# Patient Record
Sex: Female | Born: 1940 | Race: White | Hispanic: No | Marital: Married | Smoking: Former smoker
Health system: Southern US, Community
[De-identification: ages and names within clinical notes are randomized; demographics above are authoritative.]

## PROBLEM LIST (undated history)

## (undated) DIAGNOSIS — E7800 Pure hypercholesterolemia, unspecified: Secondary | ICD-10-CM

## (undated) DIAGNOSIS — E240 Pituitary-dependent Cushing's disease: Secondary | ICD-10-CM

## (undated) DIAGNOSIS — C569 Malignant neoplasm of unspecified ovary: Secondary | ICD-10-CM

## (undated) DIAGNOSIS — C787 Secondary malignant neoplasm of liver and intrahepatic bile duct: Secondary | ICD-10-CM

## (undated) DIAGNOSIS — M8580 Other specified disorders of bone density and structure, unspecified site: Secondary | ICD-10-CM

## (undated) DIAGNOSIS — I10 Essential (primary) hypertension: Secondary | ICD-10-CM

## (undated) HISTORY — PX: VAGINAL HYSTERECTOMY: SUR661

---

## 2015-07-07 ENCOUNTER — Other Ambulatory Visit: Payer: Self-pay | Admitting: Obstetrics & Gynecology

## 2015-07-07 DIAGNOSIS — R1900 Intra-abdominal and pelvic swelling, mass and lump, unspecified site: Secondary | ICD-10-CM

## 2015-07-10 ENCOUNTER — Inpatient Hospital Stay: Admission: RE | Admit: 2015-07-10 | Payer: Self-pay | Source: Ambulatory Visit

## 2015-07-13 ENCOUNTER — Ambulatory Visit
Admission: RE | Admit: 2015-07-13 | Discharge: 2015-07-13 | Disposition: A | Discharge status: Home / Self Care | Payer: PRIVATE HEALTH INSURANCE | Source: Ambulatory Visit | Attending: Obstetrics & Gynecology | Admitting: Obstetrics & Gynecology

## 2015-07-13 DIAGNOSIS — R1900 Intra-abdominal and pelvic swelling, mass and lump, unspecified site: Secondary | ICD-10-CM

## 2015-07-13 MED ORDER — IOPAMIDOL (ISOVUE-300) INJECTION 61%
100.0000 mL | Freq: Once | INTRAVENOUS | Status: AC | PRN
  Administered 2015-07-13: 100 mL via INTRAVENOUS

## 2015-07-23 DIAGNOSIS — J302 Other seasonal allergic rhinitis: Secondary | ICD-10-CM | POA: Insufficient documentation

## 2015-07-23 DIAGNOSIS — K8020 Calculus of gallbladder without cholecystitis without obstruction: Secondary | ICD-10-CM | POA: Insufficient documentation

## 2015-07-23 DIAGNOSIS — M159 Polyosteoarthritis, unspecified: Secondary | ICD-10-CM | POA: Insufficient documentation

## 2015-07-23 DIAGNOSIS — Z8639 Personal history of other endocrine, nutritional and metabolic disease: Secondary | ICD-10-CM | POA: Insufficient documentation

## 2015-07-23 DIAGNOSIS — R030 Elevated blood-pressure reading, without diagnosis of hypertension: Secondary | ICD-10-CM | POA: Insufficient documentation

## 2015-07-30 ENCOUNTER — Other Ambulatory Visit (HOSPITAL_COMMUNITY)
Admission: RE | Admit: 2015-07-30 | Discharge: 2015-07-30 | Disposition: A | Discharge status: Home / Self Care | Payer: PRIVATE HEALTH INSURANCE | Source: Ambulatory Visit | Attending: Obstetrics & Gynecology | Admitting: Obstetrics & Gynecology

## 2015-07-30 ENCOUNTER — Other Ambulatory Visit: Payer: Self-pay | Admitting: Obstetrics & Gynecology

## 2015-07-30 DIAGNOSIS — R1900 Intra-abdominal and pelvic swelling, mass and lump, unspecified site: Secondary | ICD-10-CM | POA: Diagnosis present

## 2015-08-07 ENCOUNTER — Telehealth: Payer: Self-pay | Admitting: *Deleted

## 2015-08-07 ENCOUNTER — Encounter: Payer: Self-pay | Admitting: Gynecology

## 2015-08-07 ENCOUNTER — Ambulatory Visit: Payer: Medicare Other | Attending: Gynecology | Admitting: Gynecology

## 2015-08-07 VITALS — BP 139/76 | HR 84 | Temp 98.2°F | Resp 20

## 2015-08-07 DIAGNOSIS — E782 Mixed hyperlipidemia: Secondary | ICD-10-CM | POA: Insufficient documentation

## 2015-08-07 DIAGNOSIS — C561 Malignant neoplasm of right ovary: Secondary | ICD-10-CM | POA: Insufficient documentation

## 2015-08-07 NOTE — Patient Instructions (Signed)
We will have you meet with Dr. Evlyn Clines, Medical Oncologist, to discuss initiating chemotherapy.  Please call for any questions or concerns.

## 2015-08-07 NOTE — Telephone Encounter (Signed)
PT. HAS BEEN CALLED BY KIM IN GYN TO INFORM PT. THAT HER APPOINTMENT WITH DR.CLARKE PEARSON IS TODAY AT 3:00PM.

## 2015-08-07 NOTE — Progress Notes (Signed)
Consult Note: Gyn-Onc   Emily Hull 74 y.o. female  No chief complaint on file.   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  Plan: The pros and cons of completion of surgical staging versus initiation of primary chemotherapy were discussed with the patient and her husband. In my opinion, completion of surgical staging at this juncture would simply be an academic matter to more precisely assigned surgical stage. On the other hand, even with the current pathology, she is stage II C and needs chemotherapy. Rather than delay initiation of chemotherapy to perform additional surgery the patient, her husband, and iron agreement to proceed with chemotherapy.  I recommend treatment with carboplatin and Taxol every 3 weeks for 6 cycles. We will monitor CA-125 at each cycle. At the completion of 6 cycles, provided the CA-125 is normal, we'll obtain a CT scan for restaging and then initiate follow-up at 3 month intervals for 2 years.  The patient's given an appointment to see Dr. Marko Hull as a new patient. I will plan to see the patient back periodically during chemotherapy.    HPI: 74 year old white married female seen in consultation request of Dr. Dory Hull regarding management of a newly diagnosed high-grade papillary serous carcinoma the ovary. The patient was initially seen for evaluation of vaginal discharge and postmenopausal bleeding. An ultrasound was obtained on 07/02/2015 which showed heterogeneous endometrial echogenicity with a 1.2 cm hypoechoic posterior myometrial mass and endometrial stripe measuring 6-7 mm. In addition the right ovary had a 1.9 cm cystic mass with slightly prominent vascularity. The left ovary appeared to measure 5. 4 x 4 by 3.9 cm and was cystic with grossly normal ovarian flow. There is a tiny amount of free peritoneal fluid in the cul-de-sac. CA-125 is 55 units per mL.  Patient underwent a CT scan of the abdomen and pelvis on August 22 showing a 5.2 cm left adnexal mass, colonic diverticulosis gallstones small hiatal hernia. There are no pathologic enlarged lymph nodes and no evidence of peritoneal disease. Patient underwent laparoscopically-assisted total vaginal strep B bilateral salpingo-oophorectomy on the temporal ninth 2016. She was found to have a high-grade serous carcinoma arising from the right ovary involving the serosa of the ovary and adjacent fallopian tube. Peritoneal washings were positive. 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 The patient's had an uncomplicated postoperative course.  ( review of the pathology report says that the right ovary contained the malignancy however I wonder if these were mislabeled. Given the 2 imaging studies and Dr. Verlon Hull operative note indicating that the malignancy was in the left ovary)  Review of Systems:10 point review of systems is negative except as noted in interval history.   Vitals: Blood pressure 139/76, pulse 84, temperature 98.2 F (36.8 C), temperature source Oral, resp. rate 20.  Physical Exam: General : The patient is a healthy woman in no acute distress.  HEENT: normocephalic, extraoccular movements normal; neck is supple without thyromegally  Lynphnodes: Supraclavicular and inguinal nodes not enlarged  Abdomen: Soft, non-tender, no ascites, no organomegally, no masses, no hernias , incisions are healing well Pelvic:  Deferred     Not on File  History reviewed. No pertinent past medical history.  History reviewed. No pertinent past surgical history.  Current Outpatient Prescriptions  Medication Sig Dispense Refill  . aspirin 81 MG EC tablet Take 81 mg by mouth.    [name]  atorvastatin (LIPITOR) 10 MG tablet     . Calcium-Vitamin D 500-125 MG-UNIT TABS Take by mouth.    . Coenzyme Q10 (CO Q 10) 100 MG CAPS Take 1 capsule by mouth.     . Glucosamine-Chondroitin 500-400 MG CAPS Take by mouth.    . loratadine (CLARITIN) 10 MG tablet Take 10 mg by mouth.    . Multiple Vitamin (MULTIVITAMIN) capsule Take 1 capsule by mouth.    . Omega-3 Fatty Acids (OMEGA-3 FISH OIL) 300 MG CAPS Take 1 capsule by mouth.    . vitamin E 400 UNIT capsule Take 400 Units by mouth.     No current facility-administered medications for this visit.    Social History   Social History  . Marital Status: Married    Spouse Name: N/A  . Number of Children: N/A  . Years of Education: N/A   Occupational History  . Not on file.   Social History Main Topics  . Smoking status: Never Smoker   . Smokeless tobacco: Not on file  . Alcohol Use: No  . Drug Use: No  . Sexual Activity: Not on file   Other Topics Concern  . Not on file   Social History Narrative  . No narrative on file    History reviewed. No pertinent family history.    Emily Chapel, MD 08/07/2015, 3:08 PM

## 2015-08-09 ENCOUNTER — Other Ambulatory Visit: Payer: Self-pay | Admitting: Oncology

## 2015-08-09 DIAGNOSIS — C562 Malignant neoplasm of left ovary: Secondary | ICD-10-CM

## 2015-08-11 ENCOUNTER — Other Ambulatory Visit: Payer: Medicare Other

## 2015-08-11 ENCOUNTER — Encounter: Payer: Self-pay | Admitting: *Deleted

## 2015-08-12 ENCOUNTER — Other Ambulatory Visit: Payer: Self-pay | Admitting: Oncology

## 2015-08-13 ENCOUNTER — Ambulatory Visit (HOSPITAL_BASED_OUTPATIENT_CLINIC_OR_DEPARTMENT_OTHER): Payer: Medicare Other | Admitting: Oncology

## 2015-08-13 ENCOUNTER — Other Ambulatory Visit: Payer: Self-pay | Admitting: *Deleted

## 2015-08-13 ENCOUNTER — Telehealth: Payer: Self-pay | Admitting: Oncology

## 2015-08-13 ENCOUNTER — Other Ambulatory Visit (HOSPITAL_BASED_OUTPATIENT_CLINIC_OR_DEPARTMENT_OTHER): Payer: Medicare Other

## 2015-08-13 VITALS — BP 151/79 | HR 73 | Temp 98.0°F | Resp 18 | Ht 64.5 in | Wt 151.6 lb

## 2015-08-13 DIAGNOSIS — C561 Malignant neoplasm of right ovary: Secondary | ICD-10-CM | POA: Diagnosis not present

## 2015-08-13 DIAGNOSIS — E7800 Pure hypercholesterolemia, unspecified: Secondary | ICD-10-CM

## 2015-08-13 DIAGNOSIS — C562 Malignant neoplasm of left ovary: Secondary | ICD-10-CM

## 2015-08-13 DIAGNOSIS — M8580 Other specified disorders of bone density and structure, unspecified site: Secondary | ICD-10-CM | POA: Diagnosis not present

## 2015-08-13 DIAGNOSIS — Z87891 Personal history of nicotine dependence: Secondary | ICD-10-CM

## 2015-08-13 DIAGNOSIS — Z8639 Personal history of other endocrine, nutritional and metabolic disease: Secondary | ICD-10-CM

## 2015-08-13 LAB — CBC WITH DIFFERENTIAL/PLATELET
BASO%: 1.3 % (ref 0.0–2.0)
Basophils Absolute: 0.1 10*3/uL (ref 0.0–0.1)
EOS%: 3 % (ref 0.0–7.0)
Eosinophils Absolute: 0.1 10*3/uL (ref 0.0–0.5)
HCT: 41.2 % (ref 34.8–46.6)
HGB: 13.7 g/dL (ref 11.6–15.9)
LYMPH%: 24.6 % (ref 14.0–49.7)
MCH: 29.2 pg (ref 25.1–34.0)
MCHC: 33.2 g/dL (ref 31.5–36.0)
MCV: 87.8 fL (ref 79.5–101.0)
MONO#: 0.5 10*3/uL (ref 0.1–0.9)
MONO%: 12.5 % (ref 0.0–14.0)
NEUT%: 58.6 % (ref 38.4–76.8)
NEUTROS ABS: 2.5 10*3/uL (ref 1.5–6.5)
Platelets: 172 10*3/uL (ref 145–400)
RBC: 4.7 10*6/uL (ref 3.70–5.45)
RDW: 13.9 % (ref 11.2–14.5)
WBC: 4.2 10*3/uL (ref 3.9–10.3)
lymph#: 1 10*3/uL (ref 0.9–3.3)

## 2015-08-13 LAB — COMPREHENSIVE METABOLIC PANEL (CC13)
ALT: 14 U/L (ref 0–55)
AST: 16 U/L (ref 5–34)
Albumin: 3.7 g/dL (ref 3.5–5.0)
Alkaline Phosphatase: 63 U/L (ref 40–150)
Anion Gap: 7 mEq/L (ref 3–11)
BILIRUBIN TOTAL: 0.41 mg/dL (ref 0.20–1.20)
BUN: 14.7 mg/dL (ref 7.0–26.0)
CO2: 25 meq/L (ref 22–29)
Calcium: 9.5 mg/dL (ref 8.4–10.4)
Chloride: 109 mEq/L (ref 98–109)
Creatinine: 0.8 mg/dL (ref 0.6–1.1)
EGFR: 79 mL/min/{1.73_m2} — AB (ref >90)
GLUCOSE: 84 mg/dL (ref 70–140)
Potassium: 4.2 mEq/L (ref 3.5–5.1)
SODIUM: 141 meq/L (ref 136–145)
TOTAL PROTEIN: 7 g/dL (ref 6.4–8.3)

## 2015-08-13 MED ORDER — DEXAMETHASONE 4 MG PO TABS: ORAL_TABLET | ORAL | Status: DC

## 2015-08-13 MED ORDER — LORAZEPAM 0.5 MG PO TABS: ORAL_TABLET | ORAL | Status: DC

## 2015-08-13 MED ORDER — ONDANSETRON HCL 8 MG PO TABS: ORAL_TABLET | ORAL | Status: DC

## 2015-08-13 NOTE — Telephone Encounter (Signed)
Appointments made and avs printed for pateint °

## 2015-08-13 NOTE — Patient Instructions (Signed)
We will send prescriptions to your pharmacy   1.decadron (dexamethasone, steroid) 4 mg. Take five tablets +(=20 mg) with food 12 hrs before taxol chemotherapy and five tablets with food 6 hrs before taxol   2.zofran (ondansetron) 8mg One tablet every 8 hrs as needed for nausea. Will not make you drowsy. Fine to take one tablet AM after chemo whether or not any nausea then, to extend coverage for nausea a bit longer. Other than that dose, fine to take just as needed for nausea   3.ativan (lorazepam) 0.5 mg. One tablet swallow or dissolve under tongue every 6 hrs as needed for nausea. WIll make you drowsy and a little forgetful around each dose. Fine to take one tablet at bedtime night of chemo whether or not any nausea.   You can call any time if needed [phone]  

## 2015-08-13 NOTE — Progress Notes (Signed)
[facility] NEW PATIENT EVALUATION   Name: Emily Hull Date: August 13, 2015  MRN: [id number] DOB: 1941/10/25  REFERRING PHYSICIAN: Deedra Ehrich CC: Dory Horn, Darrelyn Hillock, [place], [place] [phone]/ [phone]), Maudie Flakes, gynecology [place], [place])   REASON FOR REFERRAL: High grade serous carcinoma of right ovary, at least IIC   HISTORY OF PRESENT ILLNESS:Emily Hull is a 74 y.o. female who is seen in consultation, together with husband, at the request of Dr Josephina Shih, for consideration of chemotherapy for incompletely staged high grade serous carcinoma of right ovary at least IIC.  Patient has no prior history of gyn concerns, last gyn exam with PAP Feb 2015 [name]). She had light spotting ~ Jul 01, 2015, seen at Urgent Care with pelvic US done in [facility] system 07-02-15 reportedly with uterus 5.5 x 3.1 x 2.9 cm with endometrial stripe 6-63m, a 1.2 cm hypoechoic posterior myometrial mass, right ovary 3.1 x 2.1 x 2.1 cm with 1.9 cm cyst and slightly prominent vascularity, and left ovary not clearly identified with 5.4 x 4.0 x 3.9 cm cystin left adnexa. She was referred to Dr RDory Horn with endometrial biopsy benign findings. CA 125 was 55 on 07-07-15.  [place] with 5.2 cm cystic lesion in left adnexa with probable thin internal septation, uterus unremarkable, no ascites, also sigmoid diverticulosis and cholelithiasis. She had laparoscopically assisted vaginal hysterectomy, BSO, resection of bilateral cystic adnexal masses and peritoneal washings. Intraoperatively there was no abnormality in upper abdomen, no peritoneal lesions, large cystic smooth mass in left adnexa and in right adnexa a hemorrhagic appearing cystic structure adherent to right pelvic sidewall, no ascites. Surgical Pathology ([place] [phone] [phone] with high grade serous carcinoma in right ovary  biopsy/ wedge resection and mucinous cystadenoma with benign tube on left, benign cervix and uterus. [facility] wash[facility] ([facility] [phone] malignant cells consistent with high grade carcinoma. SHe was seen in consultation by Dr CJosephina Shihon 08-07-15, who recommended proceeding with chemotherapy in preference to completion surgical staging with the delay that would require prior to systemic treatment. Recommendation was 6 cycles of carboplatin taxol with q 3 week dosing, then if CA 125 is normal by completion to repeat CT then.  Dr CJosephina Shihplans to see her again while chemo is in progress. She has had no postoperative complications. She is to see Dr NNori Riisagain 08-20-15 for 3 week follow up.  She and husband attended chemotherapy education class on 08-11-15.  REVIEW OF SYSTEMS: No pain and no nausea post op, did not need any pain medication. Some constipation after surgery, resolved with PHardin Negus No bladder symptoms. No fever, no bleeding. Lost ~ 5 lbs with surgery tho weight had been stable prior. Peripheral IV access somewhat difficult, prefers butterflies for blood draws and pediatric access otherwise, but prefers peripheral to central line if possible. Energy good now, back to regular activities. No HA. Reading glasses. No difficulty hearing, tested 4 years ago. No dental concerns, had q 6 mo exams and cleanings [facility] dentist here yet. No thyroid disease. No respiratory symptoms. No changes in breasts. No arthritis. No history blood clots. Did not use LMW heparin after surgery. No peripheral neuropathy  Remainder of full 10 point review of systems negative.   ALLERGIES: Review of patient's allergies indicates not on file.  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  Left ovary and tube removed in her late 54s for cyst Incidental appendectomy with left  SO Partial oophorectomy on right 5 years after left ovary removed Cataracts removed bilaterally G2P1, one miscarriage Environmental allergies, prn claritin Osteopenia Mammograms done at Dr [name] office [phone] Elevated lipids   CURRENT MEDICATIONS: reviewed as listed now in EMR. Prescriptions for zofran, ativan, decadron. Flu vaccine done prior to today's consultation  [place]   SOCIAL HISTORY: originally from [place], most recently in [place] prior to moving to [place] in 2015, as she and husband retired from [employer] and 2 sons live in [place] (one biologic, one adopted), 2 grandchildren ages 87 yrs and 26 mo here. Patient has worked in Energy manager and husband is Advice worker in [employer]. Minimal cigarettes in 20s, no ETOH, no drugs, no transfusions.    FAMILY HISTORY:  Father died of MI at 14 Mother Parkinsons Sister no diagnoses Son healthy     PHYSICAL EXAM:  height is 5' 4.5" (1.638 m) and weight is 151 lb 9.6 oz (68.765 kg). Her oral temperature is 98 F (36.7 C). Her blood pressure is 151/79 and her pulse is 73. Her respiration is 18.  Alert, pleasant, cooperative lady looks younger than stated age, good historian, looks comfortable. Husband supportive.  HEENT:normal hair pattern, PERRL, not icteric. Oral mucosa moist and clear, posterior pharynx likewise, no obvious dental concerns. Neck supple without thyroid mass or JVD.  RESPIRATORY:lungs clear to A and P  CARDIAC/ VASCULAR: heart RRR no gallop, clear heart sounds. Peripheral pulses symmetrical  ABDOMEN: soft, not tender, not distended, no HSM or mass. Surgical incisions healing well. Normally active BS  LYMPH NODES: no cervical, supraclavicular, axillary, inguinal adenopathy  BREASTS: bilaterally without dominant mass, skin or nipple findings of concern.  NEUROLOGIC: CN, motor, sensory, cerebellar nonfocal. PSYCH appropriate  mood and affect  SKIN:without rash, ecchymosis, petechiae  MUSCULOSKELETAL: symmetrical muscle mass. Back not tender. LE without edema, cords, tenderness    LABORATORY DATA:  Results for orders placed or performed in visit on 08/13/15 (from the past 48 hour(s))  CBC with Differential     Status: None   Collection Time: 08/13/15 10:59 AM  Result Value Ref Range   WBC 4.2 3.9 - 10.3 10e3/uL   NEUT# 2.5 1.5 - 6.5 10e3/uL   HGB 13.7 11.6 - 15.9 g/dL   HCT 41.2 34.8 - 46.6 %   Platelets 172 145 - 400 10e3/uL   MCV 87.8 79.5 - 101.0 fL   MCH 29.2 25.1 - 34.0 pg   MCHC 33.2 31.5 - 36.0 g/dL   RBC 4.70 3.70 - 5.45 10e6/uL   RDW 13.9 11.2 - 14.5 %   lymph# 1.0 0.9 - 3.3 10e3/uL   MONO# 0.5 0.1 - 0.9 10e3/uL   Eosinophils Absolute 0.1 0.0 - 0.5 10e3/uL   Basophils Absolute 0.1 0.0 - 0.1 10e3/uL   NEUT% 58.6 38.4 - 76.8 %   LYMPH% 24.6 14.0 - 49.7 %   MONO% 12.5 0.0 - 14.0 %   EOS% 3.0 0.0 - 7.0 %   BASO% 1.3 0.0 - 2.0 %  Comprehensive metabolic panel (Cmet) - [facility]     Status: Abnormal   Collection Time: 08/13/15 11:00 AM  Result Value Ref Range   Sodium 141 136 - 145 mEq/L   Potassium 4.2 3.5 - 5.1 mEq/L   Chloride 109 98 - 109 mEq/L   CO2 25 22 - 29 mEq/L  Glucose 84 70 - 140 mg/dl    Comment: Glucose reference range is for nonfasting patients. Fasting glucose reference range is 70- 100.   BUN 14.7 7.0 - 26.0 mg/dL   Creatinine 0.8 0.6 - 1.1 mg/dL   Total Bilirubin 0.41 0.20 - 1.20 mg/dL   Alkaline Phosphatase 63 40 - 150 U/L   AST 16 5 - 34 U/L   ALT 14 0 - 55 U/L   Total Protein 7.0 6.4 - 8.3 g/dL   Albumin 3.7 3.5 - 5.0 g/dL   Calcium 9.5 8.4 - 10.4 mg/dL   Anion Gap 7 3 - 11 mEq/L   EGFR 79 (L) >90 ml/min/1.73 m2    Comment: eGFR is calculated using the CKD-EPI Creatinine Equation (2009)      PATHOLOGY: RILEIGH, KAWASHIMA ([id number]) Patient: KINJAL, NEITZKE Collected: 07/30/2015 Client: [facility] Accession: [id number] Received: 07/30/2015  Evette Cristal, MDORT OF SURGICAL PATHOLOGY FINAL DIAGNOSIS Diagnosis Uterus and cervix, with right ovary and fallopian tube - RIGHT OVARY WITH HIGH GRADE SEROUS CARCINOMA, 2 CM IN GREATEST DIMENSION. - TUMOR INVOLVES SEROSAL SURFACE OF RIGHT OVARY AND INVOLVES ADJACENT RIGHT FALLOPIAN TUBE. - SEE ONCOLOGY TEMPLATE. ADDITIONAL FINDINGS: UTERUS: - BENIGN CERVIX. - BENIGN ENDOMETRIUM WITH BENIGN ENDOMETRIAL-TYPE POLYP FORMATION. - BENIGN MYOMETRIUM. Microscopic Comment OVARY ONCOLOGY TEMPLATE Specimen(s): Uterus with cervix and right adnexa. Procedure: (including lymph node sampling) Hysterectomy with bilateral salpingo-oophorectomy (please also see case [id number]). Primary tumor site (including laterality): Right ovary. Ovarian surface involvement: Yes. Ovarian capsule intact without fragmentation: No, tissue is fragmented. Maximum tumor size (cm): 2.0 cm. Histologic type: High grade serous carcinoma, see below comment. Grade: High grade. Peritoneal implants: (specify invasive or non-invasive): Peritoneal tissue is not received. Pelvic extension (list additional structures on separate lines and if involved): Tumor involves right ovary and right fallopian tube without additional structures involved. Lymph nodes: No additional lymph nodes received. TNM code: pT2c, pNX, see comments. FIGO Stage (based on pathologic findings, needs clinical correlation): IIC. Comments: The concomitant peritoneal washing specimen ([id number]) demonstrates malignant cells consistent with high grade carcinoma. As this is the case and the right fallopian tube is involved with impants, the tumor stage is given as above per the current AJCC (7th edition) staging system. Immunohistochemical stains are performed on the tumor. The tumor is positive for cytokeratin 7, p53, WT-1, and estrogen receptor. TTF-1 and PLAP demonstrate aberrant staining. The tumor is negative for cytokeratin 20, CDX-2 and CD30. The  morphology coupled with the staining pattern is consistent with the above tumor type.     TASHE, PURDON Collected: 07/30/2015 Client: [facility] Accession: [id number] Received: 07/30/2015 Evette Cristal, MD Adequacy Reason Satisfactory For Evaluation. Diagnosis PERITONEAL WASHING PELVIC (SPECIMEN 1 OF 1, COLLECTED ON 07/30/2015): MALIGNANT CELLS CONSISTENT WITH HIGH GRADE CARCINOMA.   RADIOGRAPHY: : CT ABDOMEN AND PELVIS WITH CONTRAST  COMPARISON: None.  FINDINGS: Lower Chest: No acute findings.  Hepatobiliary: No masses or other significant abnormality identified. Several large cholesterol gallstones are seen, however there is no evidence of acute cholecystitis or biliary dilatation.  Pancreas: No mass, inflammatory changes, or other significant abnormality identified.  Spleen: Within normal limits in size and appearance.  Adrenals: No masses identified.  Kidneys/Urinary Tract: No evidence of masses or hydronephrosis.  Stomach/Bowel/Peritoneum: No evidence of wall thickening, mass, or obstruction. Small hiatal hernia noted. Sigmoid diverticulosis is demonstrated, however there is no evidence of diverticulitis or other inflammatory process.  Vascular/Lymphatic: No pathologically enlarged lymph nodes identified. No abdominal aortic aneurysm or other  significant retroperitoneal abnormality demonstrated.  Reproductive: Uterus is unremarkable in appearance. A 5.2 cm cystic lesion is seen in the left adnexa which contains a probable thin internal septation. No thickened septations, mural nodularity, or contrast enhancement identified. This has indeterminate but probably benign characteristics. No evidence of ascites.  Other: None.  Musculoskeletal: Small sclerotic bone lesions in left acetabulum noted, likely due to small benign bone islands.  IMPRESSION: 5.2 cm left adnexal cystic lesion, with indeterminate but probably benign  characteristics. In a postmenopausal female, consider continued annual imaging followup with CT or MRI versus surgical evaluation.  Colonic diverticulosis. No radiographic evidence of diverticulitis.  Cholelithiasis. No radiographic evidence of cholecystitis.  Small hiatal hernia.   PACs images of CT reviewed by MD.    DISCUSSION: all of history above reviewed with patient and husband. Rationale for chemotherapy discussed; they understand recommendation for this in preference to further staging surgery now. We have discussed mechanism of action of chemotherapy and reviewed premedication decadron for taxol, possible aches from taxol (claritin recommended0 and possible peripheral neuropathy from taxol. We have discussed antiemetics and follow up of blood counts to document cytopenias, with treatment if needed. She understands that she will lose hair around time of second treatment, plans to attend [facility]. Discussed peripheral IV access as noted. DIscussed outpatient chemotherapy and follow up thru treatment.  Tuesdays best for treatment, or Mondays. Prefers to begin ~ Oct 3 or 4     IMPRESSION / PLAN:  1.high grade serous carcinoma of right ovary: unexpected finding at laparoscopic vaginal hysterectomy BSO 07-30-15, not completely staged but at least IIC. Plan for 6 cycles carboplatin taxol beginning ~ 08-25-15, q 3 week dosing. I will see her back with counts ~ 1 week and ~ 2 weeks after first treatment; message to gyn onc for appointment back to Dr Josephina Shih ~ Nov or Dec.  2.history of Cushings disease diagnosed 1979, surgery x2 last 1990. Saw endocrinologist last Oct 2015 (Dr Elijah Birk Haugen). 3.osteopenia by bone density scan ~ 2 years ago 4.flu vaccine done 5.environmental allergies, uses prn Claritin 6.up to date mammograms, done 06-2015 at Dr [name] office 7.post unilateral salpingo oophorectomy (left) ~ 45 years ago 8.post incidental oophorectomy with  remote gyn surgery 9.minimal remote tobacco 10.elevated cholesterol on lipitor    Patient and husband have had questions answered to their satisfaction and are in agreement with plan above. They can contact this office for questions or concerns at any time prior to next scheduled visit. Patient has given verbal consent for chemotherapy. Chemo orders and gCSF placed, managed care notified. RN to speak with her by phone shortly prior to chemo to review timing of premed steroids and antiemetics.  Time spent 60 min , including >50% discussion and coordination of care. Cc Drs Nori Riis, Josephina Shih, Katie. Patient does not request cc to prior MDs.  Gordy Levan, MD 08/13/2015 5:58 PM

## 2015-08-14 LAB — CA 125: CA 125: 96 U/mL — ABNORMAL HIGH (ref <35)

## 2015-08-16 ENCOUNTER — Encounter: Payer: Self-pay | Admitting: Oncology

## 2015-08-16 ENCOUNTER — Other Ambulatory Visit: Payer: Self-pay | Admitting: Oncology

## 2015-08-16 DIAGNOSIS — Z8639 Personal history of other endocrine, nutritional and metabolic disease: Secondary | ICD-10-CM | POA: Insufficient documentation

## 2015-08-16 DIAGNOSIS — E7800 Pure hypercholesterolemia, unspecified: Secondary | ICD-10-CM | POA: Insufficient documentation

## 2015-08-16 DIAGNOSIS — M8580 Other specified disorders of bone density and structure, unspecified site: Secondary | ICD-10-CM | POA: Insufficient documentation

## 2015-08-21 ENCOUNTER — Telehealth: Payer: Self-pay | Admitting: *Deleted

## 2015-08-21 NOTE — Telephone Encounter (Signed)
-----   Message from Gordy Levan, MD sent at 08/13/2015  2:06 PM EDT ----- Please send scripts to Krist[facility] Oppen[facility] at [facility] always fine  1.zofran 8 mg:  1 q 8 hr prn nausea. Will not make drowsy  #30  1RF  2.ativan 0.5 mg:  1 SL or po q 6 hr prn nausea. Will make drowsy  #20 NRF  3.decadron 4 mg:  Five tabs with food (=20mg ) 12 hrs prior to chemo and 5 tabs with    food (=20 mg) 6 hrs prior to chemo   #10 for first cycle only   RN PLEASE CALL  to review times and instructions for decadron shortly prior to first chemo, which will be either Tues 10-4 or maybe Mon 10-3. Go over nausea meds, remind her to take claritin beginning day prior to chemo or day of chemo for ~ 5 days, remind her about possible taxol aches  thanks

## 2015-08-21 NOTE — Telephone Encounter (Signed)
Reviewed below with patient. Verbalized understanding

## 2015-08-24 ENCOUNTER — Other Ambulatory Visit: Payer: Self-pay

## 2015-08-24 DIAGNOSIS — C561 Malignant neoplasm of right ovary: Secondary | ICD-10-CM

## 2015-08-25 ENCOUNTER — Encounter: Payer: Self-pay | Admitting: Oncology

## 2015-08-25 ENCOUNTER — Other Ambulatory Visit (HOSPITAL_BASED_OUTPATIENT_CLINIC_OR_DEPARTMENT_OTHER): Payer: Medicare Other

## 2015-08-25 ENCOUNTER — Ambulatory Visit (HOSPITAL_BASED_OUTPATIENT_CLINIC_OR_DEPARTMENT_OTHER): Payer: Medicare Other

## 2015-08-25 VITALS — BP 155/77 | HR 82 | Temp 97.7°F | Resp 18

## 2015-08-25 DIAGNOSIS — Z5111 Encounter for antineoplastic chemotherapy: Secondary | ICD-10-CM

## 2015-08-25 DIAGNOSIS — C561 Malignant neoplasm of right ovary: Secondary | ICD-10-CM

## 2015-08-25 LAB — COMPREHENSIVE METABOLIC PANEL (CC13)
ALT: 19 U/L (ref 0–55)
AST: 20 U/L (ref 5–34)
Albumin: 4.2 g/dL (ref 3.5–5.0)
Alkaline Phosphatase: 67 U/L (ref 40–150)
Anion Gap: 11 mEq/L (ref 3–11)
BILIRUBIN TOTAL: 0.38 mg/dL (ref 0.20–1.20)
BUN: 12.6 mg/dL (ref 7.0–26.0)
CO2: 20 meq/L — AB (ref 22–29)
Calcium: 10.4 mg/dL (ref 8.4–10.4)
Chloride: 109 mEq/L (ref 98–109)
Creatinine: 0.8 mg/dL (ref 0.6–1.1)
EGFR: 79 mL/min/{1.73_m2} — AB (ref >90)
GLUCOSE: 149 mg/dL — AB (ref 70–140)
Potassium: 4.1 mEq/L (ref 3.5–5.1)
SODIUM: 140 meq/L (ref 136–145)
TOTAL PROTEIN: 7.9 g/dL (ref 6.4–8.3)

## 2015-08-25 LAB — CBC WITH DIFFERENTIAL/PLATELET
BASO%: 0.8 % (ref 0.0–2.0)
BASOS ABS: 0 10*3/uL (ref 0.0–0.1)
EOS%: 0.2 % (ref 0.0–7.0)
Eosinophils Absolute: 0 10*3/uL (ref 0.0–0.5)
HEMATOCRIT: 45.9 % (ref 34.8–46.6)
HGB: 15.1 g/dL (ref 11.6–15.9)
LYMPH#: 0.4 10*3/uL — AB (ref 0.9–3.3)
LYMPH%: 15.3 % (ref 14.0–49.7)
MCH: 29 pg (ref 25.1–34.0)
MCHC: 33 g/dL (ref 31.5–36.0)
MCV: 88 fL (ref 79.5–101.0)
MONO#: 0 10*3/uL — AB (ref 0.1–0.9)
MONO%: 1.1 % (ref 0.0–14.0)
NEUT#: 2.4 10*3/uL (ref 1.5–6.5)
NEUT%: 82.6 % — AB (ref 38.4–76.8)
PLATELETS: 149 10*3/uL (ref 145–400)
RBC: 5.22 10*6/uL (ref 3.70–5.45)
RDW: 14.2 % (ref 11.2–14.5)
WBC: 2.9 10*3/uL — ABNORMAL LOW (ref 3.9–10.3)

## 2015-08-25 MED ORDER — SODIUM CHLORIDE 0.9 % IV SOLN
Freq: Once | INTRAVENOUS | Status: AC
  Administered 2015-08-25: 11:00:00 via INTRAVENOUS
  Filled 2015-08-25: qty 8

## 2015-08-25 MED ORDER — FAMOTIDINE IN NACL 20-0.9 MG/50ML-% IV SOLN
20.0000 mg | Freq: Once | INTRAVENOUS | Status: AC
  Administered 2015-08-25: 20 mg via INTRAVENOUS

## 2015-08-25 MED ORDER — PACLITAXEL CHEMO INJECTION 300 MG/50ML
175.0000 mg/m2 | Freq: Once | INTRAVENOUS | Status: AC
  Administered 2015-08-25: 312 mg via INTRAVENOUS
  Filled 2015-08-25: qty 52

## 2015-08-25 MED ORDER — FAMOTIDINE IN NACL 20-0.9 MG/50ML-% IV SOLN
INTRAVENOUS | Status: AC
  Filled 2015-08-25: qty 50

## 2015-08-25 MED ORDER — SODIUM CHLORIDE 0.9 % IV SOLN
Freq: Once | INTRAVENOUS | Status: AC
  Administered 2015-08-25: 10:00:00 via INTRAVENOUS

## 2015-08-25 MED ORDER — SODIUM CHLORIDE 0.9 % IV SOLN
397.0000 mg | Freq: Once | INTRAVENOUS | Status: AC
  Administered 2015-08-25: 400 mg via INTRAVENOUS
  Filled 2015-08-25: qty 40

## 2015-08-25 MED ORDER — DIPHENHYDRAMINE HCL 50 MG/ML IJ SOLN
50.0000 mg | Freq: Once | INTRAMUSCULAR | Status: AC
  Administered 2015-08-25: 50 mg via INTRAVENOUS

## 2015-08-25 MED ORDER — DIPHENHYDRAMINE HCL 50 MG/ML IJ SOLN
INTRAMUSCULAR | Status: AC
  Filled 2015-08-25: qty 1

## 2015-08-25 MED ORDER — SODIUM CHLORIDE 0.9 % IJ SOLN
10.0000 mL | INTRAMUSCULAR | Status: DC | PRN
  Filled 2015-08-25: qty 10

## 2015-08-25 NOTE — Progress Notes (Signed)
Introduced myself to pt as her FA.  Informed her at this time there aren't any foundations available for copay assistance for her Dx but if she's still on treatment the 1st of the year I will research again.  Informed her of the [facility] and [place] and what they assist with but she is not interested in applying for those grants.  She has my card for any future questions, concerns or if financial assistance is needed.

## 2015-08-25 NOTE — Patient Instructions (Signed)
[facility] Discharge Instructions for Patients Receiving Chemotherapy  Today you received the following chemotherapy agents: Taxol and Carboplatin  To help prevent nausea and vomiting after your treatment, we encourage you to take your nausea medication as directed.    If you develop nausea and vomiting that is not controlled by your nausea medication, call the clinic.   BELOW ARE SYMPTOMS THAT SHOULD BE REPORTED IMMEDIATELY:  *FEVER GREATER THAN 100.5 F  *CHILLS WITH OR WITHOUT FEVER  NAUSEA AND VOMITING THAT IS NOT CONTROLLED WITH YOUR NAUSEA MEDICATION  *UNUSUAL SHORTNESS OF BREATH  *UNUSUAL BRUISING OR BLEEDING  TENDERNESS IN MOUTH AND THROAT WITH OR WITHOUT PRESENCE OF ULCERS  *URINARY PROBLEMS  *BOWEL PROBLEMS  UNUSUAL RASH Items with * indicate a potential emergency and should be followed up as soon as possible.  Feel free to call the clinic you have any questions or concerns. The clinic phone number is [phone].  Please show the CHEMO ALERT CARD at check-in to the Emergency Department and triage nurse.  Paclitaxel injection What is this medicine? PACLITAXEL (PAK li TAX el) is a chemotherapy drug. It targets fast dividing cells, like cancer cells, and causes these cells to die. This medicine is used to treat ovarian cancer, breast cancer, and other cancers. This medicine may be used for other purposes; ask your health care provider or pharmacist if you have questions. COMMON BRAND NAME(S): Onxol, Taxol What should I tell my health care provider before I take this medicine? They need to know if you have any of these conditions: -blood disorders -irregular heartbeat -infection (especially a virus infection such as chickenpox, cold sores, or herpes) -liver disease -previous or ongoing radiation therapy -an unusual or allergic reaction to paclitaxel, alcohol, polyoxyethylated castor oil, other chemotherapy agents, other medicines, foods, dyes, or  preservatives -pregnant or trying to get pregnant -breast-feeding How should I use this medicine? This drug is given as an infusion into a vein. It is administered in a hospital or clinic by a specially trained health care professional. Talk to your pediatrician regarding the use of this medicine in children. Special care may be needed. Overdosage: If you think you have taken too much of this medicine contact a poison control center or emergency room at once. NOTE: This medicine is only for you. Do not share this medicine with others. What if I miss a dose? It is important not to miss your dose. Call your doctor or health care professional if you are unable to keep an appointment. What may interact with this medicine? Do not take this medicine with any of the following medications: -disulfiram -metronidazole This medicine may also interact with the following medications: -cyclosporine -diazepam -ketoconazole -medicines to increase blood counts like filgrastim, pegfilgrastim, sargramostim -other chemotherapy drugs like cisplatin, doxorubicin, epirubicin, etoposide, teniposide, vincristine -quinidine -testosterone -vaccines -verapamil Talk to your doctor or health care professional before taking any of these medicines: -acetaminophen -aspirin -ibuprofen -ketoprofen -naproxen This list may not describe all possible interactions. Give your health care provider a list of all the medicines, herbs, non-prescription drugs, or dietary supplements you use. Also tell them if you smoke, drink alcohol, or use illegal drugs. Some items may interact with your medicine. What should I watch for while using this medicine? Your condition will be monitored carefully while you are receiving this medicine. You will need important blood work done while you are taking this medicine. This drug may make you feel generally unwell. This is not uncommon, as chemotherapy   can affect healthy cells as well as cancer  cells. Report any side effects. Continue your course of treatment even though you feel ill unless your doctor tells you to stop. In some cases, you may be given additional medicines to help with side effects. Follow all directions for their use. Call your doctor or health care professional for advice if you get a fever, chills or sore throat, or other symptoms of a cold or flu. Do not treat yourself. This drug decreases your body's ability to fight infections. Try to avoid being around people who are sick. This medicine may increase your risk to bruise or bleed. Call your doctor or health care professional if you notice any unusual bleeding. Be careful brushing and flossing your teeth or using a toothpick because you may get an infection or bleed more easily. If you have any dental work done, tell your dentist you are receiving this medicine. Avoid taking products that contain aspirin, acetaminophen, ibuprofen, naproxen, or ketoprofen unless instructed by your doctor. These medicines may hide a fever. Do not become pregnant while taking this medicine. Women should inform their doctor if they wish to become pregnant or think they might be pregnant. There is a potential for serious side effects to an unborn child. Talk to your health care professional or pharmacist for more information. Do not breast-feed an infant while taking this medicine. Men are advised not to father a child while receiving this medicine. What side effects may I notice from receiving this medicine? Side effects that you should report to your doctor or health care professional as soon as possible: -allergic reactions like skin rash, itching or hives, swelling of the face, lips, or tongue -low blood counts - This drug may decrease the number of white blood cells, red blood cells and platelets. You may be at increased risk for infections and bleeding. -signs of infection - fever or chills, cough, sore throat, pain or difficulty passing  urine -signs of decreased platelets or bleeding - bruising, pinpoint red spots on the skin, black, tarry stools, nosebleeds -signs of decreased red blood cells - unusually weak or tired, fainting spells, lightheadedness -breathing problems -chest pain -high or low blood pressure -mouth sores -nausea and vomiting -pain, swelling, redness or irritation at the injection site -pain, tingling, numbness in the hands or feet -slow or irregular heartbeat -swelling of the ankle, feet, hands Side effects that usually do not require medical attention (report to your doctor or health care professional if they continue or are bothersome): -bone pain -complete hair loss including hair on your head, underarms, pubic hair, eyebrows, and eyelashes -changes in the color of fingernails -diarrhea -loosening of the fingernails -loss of appetite -muscle or joint pain -red flush to skin -sweating This list may not describe all possible side effects. Call your doctor for medical advice about side effects. You may report side effects to FDA at 1-800-FDA-1088. Where should I keep my medicine? This drug is given in a hospital or clinic and will not be stored at home. NOTE: This sheet is a summary. It may not cover all possible information. If you have questions about this medicine, talk to your doctor, pharmacist, or health care provider.  2015, Elsevier/Gold Standard. (2012-12-31 16:41:21)  Carboplatin injection What is this medicine? CARBOPLATIN (KAR boe pla tin) is a chemotherapy drug. It targets fast dividing cells, like cancer cells, and causes these cells to die. This medicine is used to treat ovarian cancer and many other cancers. This medicine may   be used for other purposes; ask your health care provider or pharmacist if you have questions. COMMON BRAND NAME(S): Paraplatin What should I tell my health care provider before I take this medicine? They need to know if you have any of these  conditions: -blood disorders -hearing problems -kidney disease -recent or ongoing radiation therapy -an unusual or allergic reaction to carboplatin, cisplatin, other chemotherapy, other medicines, foods, dyes, or preservatives -pregnant or trying to get pregnant -breast-feeding How should I use this medicine? This drug is usually given as an infusion into a vein. It is administered in a hospital or clinic by a specially trained health care professional. Talk to your pediatrician regarding the use of this medicine in children. Special care may be needed. Overdosage: If you think you have taken too much of this medicine contact a poison control center or emergency room at once. NOTE: This medicine is only for you. Do not share this medicine with others. What if I miss a dose? It is important not to miss a dose. Call your doctor or health care professional if you are unable to keep an appointment. What may interact with this medicine? -medicines for seizures -medicines to increase blood counts like filgrastim, pegfilgrastim, sargramostim -some antibiotics like amikacin, gentamicin, neomycin, streptomycin, tobramycin -vaccines Talk to your doctor or health care professional before taking any of these medicines: -acetaminophen -aspirin -ibuprofen -ketoprofen -naproxen This list may not describe all possible interactions. Give your health care provider a list of all the medicines, herbs, non-prescription drugs, or dietary supplements you use. Also tell them if you smoke, drink alcohol, or use illegal drugs. Some items may interact with your medicine. What should I watch for while using this medicine? Your condition will be monitored carefully while you are receiving this medicine. You will need important blood work done while you are taking this medicine. This drug may make you feel generally unwell. This is not uncommon, as chemotherapy can affect healthy cells as well as cancer cells. Report  any side effects. Continue your course of treatment even though you feel ill unless your doctor tells you to stop. In some cases, you may be given additional medicines to help with side effects. Follow all directions for their use. Call your doctor or health care professional for advice if you get a fever, chills or sore throat, or other symptoms of a cold or flu. Do not treat yourself. This drug decreases your body's ability to fight infections. Try to avoid being around people who are sick. This medicine may increase your risk to bruise or bleed. Call your doctor or health care professional if you notice any unusual bleeding. Be careful brushing and flossing your teeth or using a toothpick because you may get an infection or bleed more easily. If you have any dental work done, tell your dentist you are receiving this medicine. Avoid taking products that contain aspirin, acetaminophen, ibuprofen, naproxen, or ketoprofen unless instructed by your doctor. These medicines may hide a fever. Do not become pregnant while taking this medicine. Women should inform their doctor if they wish to become pregnant or think they might be pregnant. There is a potential for serious side effects to an unborn child. Talk to your health care professional or pharmacist for more information. Do not breast-feed an infant while taking this medicine. What side effects may I notice from receiving this medicine? Side effects that you should report to your doctor or health care professional as soon as possible: -allergic reactions like   skin rash, itching or hives, swelling of the face, lips, or tongue -signs of infection - fever or chills, cough, sore throat, pain or difficulty passing urine -signs of decreased platelets or bleeding - bruising, pinpoint red spots on the skin, black, tarry stools, nosebleeds -signs of decreased red blood cells - unusually weak or tired, fainting spells, lightheadedness -breathing  problems -changes in hearing -changes in vision -chest pain -high blood pressure -low blood counts - This drug may decrease the number of white blood cells, red blood cells and platelets. You may be at increased risk for infections and bleeding. -nausea and vomiting -pain, swelling, redness or irritation at the injection site -pain, tingling, numbness in the hands or feet -problems with balance, talking, walking -trouble passing urine or change in the amount of urine Side effects that usually do not require medical attention (report to your doctor or health care professional if they continue or are bothersome): -hair loss -loss of appetite -metallic taste in the mouth or changes in taste This list may not describe all possible side effects. Call your doctor for medical advice about side effects. You may report side effects to FDA at 1-800-FDA-1088. Where should I keep my medicine? This drug is given in a hospital or clinic and will not be stored at home. NOTE: This sheet is a summary. It may not cover all possible information. If you have questions about this medicine, talk to your doctor, pharmacist, or health care provider.  2015, Elsevier/Gold Standard. (2008-02-12 14:38:05)  

## 2015-08-26 ENCOUNTER — Telehealth: Payer: Self-pay

## 2015-08-26 NOTE — Telephone Encounter (Signed)
Emily Hull is doing well. Eating and taking in fluids well. Trying to get to 64 oz. No nausea or vomiting. Her bowels are moving well.  She has stool softener if she starts to have difficulty with bowels. Reviewed taxol aches.  She will try aleve if needed. Suggested warm bath and or heating pad. She does have pain medication on had from surgery if needed.

## 2015-08-28 ENCOUNTER — Other Ambulatory Visit: Payer: Self-pay | Admitting: Oncology

## 2015-08-30 ENCOUNTER — Other Ambulatory Visit: Payer: Self-pay | Admitting: Oncology

## 2015-08-30 DIAGNOSIS — C561 Malignant neoplasm of right ovary: Secondary | ICD-10-CM

## 2015-08-31 ENCOUNTER — Telehealth: Payer: Self-pay | Admitting: Oncology

## 2015-08-31 ENCOUNTER — Encounter: Payer: Self-pay | Admitting: Oncology

## 2015-08-31 ENCOUNTER — Ambulatory Visit (HOSPITAL_BASED_OUTPATIENT_CLINIC_OR_DEPARTMENT_OTHER): Payer: Medicare Other | Admitting: Oncology

## 2015-08-31 ENCOUNTER — Other Ambulatory Visit (HOSPITAL_BASED_OUTPATIENT_CLINIC_OR_DEPARTMENT_OTHER): Payer: Medicare Other

## 2015-08-31 VITALS — BP 166/79 | HR 73 | Temp 98.0°F | Resp 18 | Ht 62.75 in | Wt 151.9 lb

## 2015-08-31 DIAGNOSIS — I878 Other specified disorders of veins: Secondary | ICD-10-CM

## 2015-08-31 DIAGNOSIS — T451X5A Adverse effect of antineoplastic and immunosuppressive drugs, initial encounter: Secondary | ICD-10-CM

## 2015-08-31 DIAGNOSIS — C561 Malignant neoplasm of right ovary: Secondary | ICD-10-CM | POA: Diagnosis not present

## 2015-08-31 DIAGNOSIS — D72819 Decreased white blood cell count, unspecified: Secondary | ICD-10-CM

## 2015-08-31 DIAGNOSIS — D701 Agranulocytosis secondary to cancer chemotherapy: Secondary | ICD-10-CM

## 2015-08-31 LAB — COMPREHENSIVE METABOLIC PANEL (CC13)
ALBUMIN: 4.1 g/dL (ref 3.5–5.0)
ALK PHOS: 60 U/L (ref 40–150)
ALT: 31 U/L (ref 0–55)
AST: 27 U/L (ref 5–34)
Anion Gap: 9 mEq/L (ref 3–11)
BILIRUBIN TOTAL: 0.82 mg/dL (ref 0.20–1.20)
BUN: 18.3 mg/dL (ref 7.0–26.0)
CO2: 25 meq/L (ref 22–29)
CREATININE: 0.8 mg/dL (ref 0.6–1.1)
Calcium: 9.5 mg/dL (ref 8.4–10.4)
Chloride: 101 mEq/L (ref 98–109)
EGFR: 79 mL/min/{1.73_m2} — ABNORMAL LOW (ref >90)
GLUCOSE: 92 mg/dL (ref 70–140)
Potassium: 4.3 mEq/L (ref 3.5–5.1)
Sodium: 134 mEq/L — ABNORMAL LOW (ref 136–145)
TOTAL PROTEIN: 7.4 g/dL (ref 6.4–8.3)

## 2015-08-31 LAB — CBC WITH DIFFERENTIAL/PLATELET
BASO%: 1.5 % (ref 0.0–2.0)
Basophils Absolute: 0 10*3/uL (ref 0.0–0.1)
EOS%: 2.1 % (ref 0.0–7.0)
Eosinophils Absolute: 0.1 10*3/uL (ref 0.0–0.5)
HCT: 44.2 % (ref 34.8–46.6)
HEMOGLOBIN: 14.6 g/dL (ref 11.6–15.9)
LYMPH%: 33.5 % (ref 14.0–49.7)
MCH: 29.1 pg (ref 25.1–34.0)
MCHC: 33 g/dL (ref 31.5–36.0)
MCV: 88.1 fL (ref 79.5–101.0)
MONO#: 0.1 10*3/uL (ref 0.1–0.9)
MONO%: 2.5 % (ref 0.0–14.0)
NEUT%: 60.4 % (ref 38.4–76.8)
NEUTROS ABS: 1.6 10*3/uL (ref 1.5–6.5)
Platelets: 128 10*3/uL — ABNORMAL LOW (ref 145–400)
RBC: 5.01 10*6/uL (ref 3.70–5.45)
RDW: 14.1 % (ref 11.2–14.5)
WBC: 2.6 10*3/uL — AB (ref 3.9–10.3)
lymph#: 0.9 10*3/uL (ref 0.9–3.3)

## 2015-08-31 NOTE — Progress Notes (Signed)
OFFICE PROGRESS NOTE   August 31, 2015   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 She had first chemo on 08-25-15, tolerated well thus far. She saw Dr Nori Riis after my first visit, will see again next week. She is to see Dr Josephina Shih on 09-24-15.  Patient needed 2 attempts for IV access for chemo, and has had some difficulty with blood draws (see below). She used ativan night of chemo and zofran AM after chemo, had no nausea and did not need additional antiemetics. She had very mild aching, did not need any intervention. She had some constipation first few days, bowels finally moved on 10-8 after taking Hardin Negus, understands that she should use this or other laxative sooner with next cycle. No peripheral neuropathy. Has walked a mile daily x 2 last week and yesterday. She has pushed po fluids so much that she was up multiple times thru night to void and did not sleep well with this, will back down somewhat as this chemo regimen does not need as aggressive hydration as other regimens. Some tastes not correct tho appetite fine - discussed diet choices.  Flu vaccine done 08-11-15 PAC requested by IR shortly prior to next chemo Consider genetics referral, not discussed today  ONCOLOGIC HISTORY Patient has no prior history of gyn concerns, last gyn exam with PAP Feb 2015 [facility] [place]). She had light spotting ~ Jul 01, 2015, seen at Urgent Care with pelvic US done in [place] system 07-02-15 reportedly with uterus 5.5 x 3.1 x 2.9 cm with endometrial stripe 6-42m, a 1.2 cm hypoechoic posterior myometrial mass, right ovary 3.1 x 2.1 x 2.1 cm with 1.9 cm cyst and slightly prominent  vascularity, and left ovary not clearly identified with 5.4 x 4.0 x 3.9 cm cystin left adnexa. She was referred to Dr RDory Horn with endometrial biopsy benign findings. CA 125 was 55 on 07-07-15. [place] with 5.2 cm cystic lesion in left adnexa with probable thin internal septation, uterus unremarkable, no ascites, also sigmoid diverticulosis and cholelithiasis. She had laparoscopically assisted vaginal hysterectomy, BSO, resection of bilateral cystic adnexal masses and peritoneal washings. Intraoperatively there was no abnormality in upper abdomen, no peritoneal lesions, large cystic smooth mass in left adnexa and in right adnexa a hemorrhagic appearing cystic structure adherent to right pelvic sidewall, no ascites. 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 Peritoneal washings ([facility] [phone] malignant cells consistent with high grade carcinoma. SHe was seen in consultation by Dr CJosephina Shihon 08-07-15, who recommended proceeding with chemotherapy in preference to completion surgical staging with the delay that would require prior to systemic treatment. Recommendation was 6 cycles of carboplatin taxol with q 3 week dosing, then if CA 125 is normal by completion to repeat CT then.  Dr CJosephina Shihplans to see her again while chemo is in progress. She has had no postoperative complications. First chemo with carboplatin taxol given 08-25-15.    Review of systems as above, also: No fever or symptoms of infection. No SOB or cough. No LE swelling. No bleeding.  Remainder of 10 point Review of Systems negative.  Objective:  Vital signs in last 24 hours:  BP 166/79 mmHg  Pulse 73  Temp(Src) 98 F (36.7 C) (Oral)  Resp 18  Ht 5' 2.75" (1.594 m)  Wt 151 lb 14.4 oz (68.901 kg)  BMI 27.12 kg/m2  SpO2  100% Weight is stable Alert, oriented and appropriate. Ambulatory without difficulty.  No alopecia  HEENT:PERRL, sclerae not icteric. Oral mucosa moist without lesions, posterior pharynx clear.  Neck supple. No JVD.  Lymphatics:no cervical,supraclavicular adenopathy Resp: clear to auscultation bilaterally and normal percussion bilaterally Cardio: regular rate and rhythm. No gallop. GI: soft, nontender, not distended, no mass or organomegaly. Normally active bowel sounds. Surgical incision not remarkable. Musculoskeletal/ Extremities: without pitting edema, cords, tenderness Neuro: no peripheral neuropathy. Otherwise nonfocal. PSYCH appropriate mood and affect Skin without rash, petechiae. Minimal ecchymoses at sites of IV access and attempted access bilateral UE   Lab Results:  Results for orders placed or performed in visit on 08/31/15  CBC with Differential  Result Value Ref Range   WBC 2.6 (L) 3.9 - 10.3 10e3/uL   NEUT# 1.6 1.5 - 6.5 10e3/uL   HGB 14.6 11.6 - 15.9 g/dL   HCT 44.2 34.8 - 46.6 %   Platelets 128 (L) 145 - 400 10e3/uL   MCV 88.1 79.5 - 101.0 fL   MCH 29.1 25.1 - 34.0 pg   MCHC 33.0 31.5 - 36.0 g/dL   RBC 5.01 3.70 - 5.45 10e6/uL   RDW 14.1 11.2 - 14.5 %   lymph# 0.9 0.9 - 3.3 10e3/uL   MONO# 0.1 0.1 - 0.9 10e3/uL   Eosinophils Absolute 0.1 0.0 - 0.5 10e3/uL   Basophils Absolute 0.0 0.0 - 0.1 10e3/uL   NEUT% 60.4 38.4 - 76.8 %   LYMPH% 33.5 14.0 - 49.7 %   MONO% 2.5 0.0 - 14.0 %   EOS% 2.1 0.0 - 7.0 %   BASO% 1.5 0.0 - 2.0 %  Comprehensive metabolic panel (Cmet) - [facility]  Result Value Ref Range   Sodium 134 (L) 136 - 145 mEq/L   Potassium 4.3 3.5 - 5.1 mEq/L   Chloride 101 98 - 109 mEq/L   CO2 25 22 - 29 mEq/L   Glucose 92 70 - 140 mg/dl   BUN 18.3 7.0 - 26.0 mg/dL   Creatinine 0.8 0.6 - 1.1 mg/dL   Total Bilirubin 0.82 0.20 - 1.20 mg/dL   Alkaline Phosphatase 60 40 - 150 U/L   AST 27 5 - 34 U/L   ALT 31 0 - 55 U/L   Total Protein 7.4 6.4 - 8.3 g/dL    Albumin 4.1 3.5 - 5.0 g/dL   Calcium 9.5 8.4 - 10.4 mg/dL   Anion Gap 9 3 - 11 mEq/L   EGFR 79 (L) >90 ml/min/1.73 m2     Studies/Results:  No results found.  Medications: I have reviewed the patient's current medications. She will use Phillips at least day of chemo and next few days; ok to use miralax or senokot S if prefers.   DISCUSSION All of interval history as above reveiwed in detail, including diet and food/ fluid choices with taste changes.  Peripheral IV access somewhat difficult. We have discussed PAC placement by IR and other information about use and maintenance of PACs; at completion of discussion patient does want to have PAC, which we will request ~ 10-21 piror to next treatment 09-15-15.   Discussed blood counts today, not at nadir. As my next appointment is 10-20, will recheck CBC on 09-04-15 and  give granix that day if [place] <=1.2; she will need granix again on 10-15 if needed on 10-14, and will need CBC possible granix on 10-17 if so.  She will avoid crowds and wash hands carefully now, and will call prior to 10-14 if much more fatigued or any suggestion of infection.    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 Carboplatin taxol begun 08-25-15, q 3 week dosing, total 6 cycles planned. Counts dropping but not at nadir, will check CBC again at least 10-14 and may need granix then (see parameters above). Cycle 2 due 09-15-15.  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 Saw endocrinologist last Oct 2015 (Dr Elijah Birk  [place]). 9.minimal remote tobacco 10.elevated cholesterol on lipitor 11.flu vaccine 08-11-15   All questions answered and patient/ husband in agreement with recommendations and plans as above. She knows to call prior to scheduled appointment if needed. Cc Dr Nori Riis and Dr Jonni Sanger. Time spent 25 min including >50% counseling and coordination of care.   Letrice Pollok P, MD   08/31/2015, 12:18 PM

## 2015-08-31 NOTE — Telephone Encounter (Signed)
Appointments made and avs printed °

## 2015-09-01 ENCOUNTER — Other Ambulatory Visit: Payer: Self-pay | Admitting: Oncology

## 2015-09-01 DIAGNOSIS — I878 Other specified disorders of veins: Secondary | ICD-10-CM | POA: Insufficient documentation

## 2015-09-01 DIAGNOSIS — T451X5A Adverse effect of antineoplastic and immunosuppressive drugs, initial encounter: Secondary | ICD-10-CM

## 2015-09-01 DIAGNOSIS — D701 Agranulocytosis secondary to cancer chemotherapy: Secondary | ICD-10-CM | POA: Insufficient documentation

## 2015-09-02 ENCOUNTER — Telehealth: Payer: Self-pay

## 2015-09-02 DIAGNOSIS — C561 Malignant neoplasm of right ovary: Secondary | ICD-10-CM

## 2015-09-02 MED ORDER — DEXAMETHASONE 4 MG PO TABS
ORAL_TABLET | ORAL | Status: DC
Start: 2015-09-02 — End: 2015-10-19

## 2015-09-02 NOTE — Telephone Encounter (Signed)
-----   Message from Gordy Levan, MD sent at 09/01/2015  8:13 PM EDT ----- Please refill decadron enough for 2 cycles with 1 RF, so total 20 tabs Be sure patient knows quantity is for 2 cycles (Next chemo 10-25) thanks

## 2015-09-02 NOTE — Telephone Encounter (Signed)
Told Emily Hull the information regarding the Decadron as noted below by Dr. Marko Plume. Discussed the rational for the possible granix on 09-04-15 and subsequent appointments if granix needed. Reviewed the aches that can occur with granix and management of symptoms with Claritin 10 mg daily, warn soaks in tub,heating pa, tylenol,as plts low.    Emily Hull verbalized understanding "the drill".

## 2015-09-04 ENCOUNTER — Other Ambulatory Visit (HOSPITAL_BASED_OUTPATIENT_CLINIC_OR_DEPARTMENT_OTHER): Payer: Medicare Other

## 2015-09-04 ENCOUNTER — Other Ambulatory Visit: Payer: Self-pay | Admitting: *Deleted

## 2015-09-04 ENCOUNTER — Telehealth: Payer: Self-pay | Admitting: Oncology

## 2015-09-04 ENCOUNTER — Ambulatory Visit (HOSPITAL_BASED_OUTPATIENT_CLINIC_OR_DEPARTMENT_OTHER): Payer: Medicare Other

## 2015-09-04 ENCOUNTER — Other Ambulatory Visit: Payer: Self-pay | Admitting: Oncology

## 2015-09-04 DIAGNOSIS — Z5189 Encounter for other specified aftercare: Secondary | ICD-10-CM

## 2015-09-04 DIAGNOSIS — C561 Malignant neoplasm of right ovary: Secondary | ICD-10-CM

## 2015-09-04 LAB — CBC WITH DIFFERENTIAL/PLATELET
BASO%: 1.6 % (ref 0.0–2.0)
BASOS ABS: 0 10*3/uL (ref 0.0–0.1)
EOS ABS: 0 10*3/uL (ref 0.0–0.5)
EOS%: 2.1 % (ref 0.0–7.0)
HEMATOCRIT: 39.9 % (ref 34.8–46.6)
HGB: 13.3 g/dL (ref 11.6–15.9)
LYMPH#: 0.7 10*3/uL — AB (ref 0.9–3.3)
LYMPH%: 43 % (ref 14.0–49.7)
MCH: 29.3 pg (ref 25.1–34.0)
MCHC: 33.2 g/dL (ref 31.5–36.0)
MCV: 88.1 fL (ref 79.5–101.0)
MONO#: 0.2 10*3/uL (ref 0.1–0.9)
MONO%: 15.4 % — ABNORMAL HIGH (ref 0.0–14.0)
NEUT#: 0.6 10*3/uL — ABNORMAL LOW (ref 1.5–6.5)
NEUT%: 37.9 % — AB (ref 38.4–76.8)
PLATELETS: 127 10*3/uL — AB (ref 145–400)
RBC: 4.53 10*6/uL (ref 3.70–5.45)
RDW: 14 % (ref 11.2–14.5)
WBC: 1.5 10*3/uL — ABNORMAL LOW (ref 3.9–10.3)

## 2015-09-04 MED ORDER — TBO-FILGRASTIM 300 MCG/0.5ML ~~LOC~~ SOSY
300.0000 ug | PREFILLED_SYRINGE | Freq: Once | SUBCUTANEOUS | Status: AC
  Administered 2015-09-04: 300 ug via SUBCUTANEOUS
  Filled 2015-09-04: qty 0.5

## 2015-09-04 MED ORDER — FILGRASTIM 300 MCG/0.5ML IJ SOSY: 300.0000 ug | PREFILLED_SYRINGE | Freq: Once | INTRAMUSCULAR | Status: DC

## 2015-09-04 NOTE — Telephone Encounter (Signed)
lvm for pt regarding to OCT appt..... °

## 2015-09-05 ENCOUNTER — Ambulatory Visit (HOSPITAL_BASED_OUTPATIENT_CLINIC_OR_DEPARTMENT_OTHER): Payer: Medicare Other

## 2015-09-05 VITALS — BP 134/70 | HR 75 | Temp 98.2°F | Resp 18

## 2015-09-05 DIAGNOSIS — C561 Malignant neoplasm of right ovary: Secondary | ICD-10-CM

## 2015-09-05 DIAGNOSIS — D701 Agranulocytosis secondary to cancer chemotherapy: Secondary | ICD-10-CM

## 2015-09-05 MED ORDER — TBO-FILGRASTIM 300 MCG/0.5ML ~~LOC~~ SOSY
300.0000 ug | PREFILLED_SYRINGE | Freq: Once | SUBCUTANEOUS | Status: AC
  Administered 2015-09-05: 300 ug via SUBCUTANEOUS

## 2015-09-05 MED ORDER — FILGRASTIM 300 MCG/0.5ML IJ SOSY: 300.0000 ug | PREFILLED_SYRINGE | Freq: Once | INTRAMUSCULAR | Status: DC

## 2015-09-07 ENCOUNTER — Other Ambulatory Visit (HOSPITAL_BASED_OUTPATIENT_CLINIC_OR_DEPARTMENT_OTHER): Payer: Medicare Other

## 2015-09-07 ENCOUNTER — Ambulatory Visit: Payer: Medicare Other

## 2015-09-07 DIAGNOSIS — C561 Malignant neoplasm of right ovary: Secondary | ICD-10-CM | POA: Diagnosis not present

## 2015-09-07 LAB — CBC WITH DIFFERENTIAL/PLATELET
BASO%: 1.4 % (ref 0.0–2.0)
Basophils Absolute: 0.1 10*3/uL (ref 0.0–0.1)
EOS ABS: 0.1 10*3/uL (ref 0.0–0.5)
EOS%: 1.2 % (ref 0.0–7.0)
HCT: 40.4 % (ref 34.8–46.6)
HGB: 13.5 g/dL (ref 11.6–15.9)
LYMPH#: 1.4 10*3/uL (ref 0.9–3.3)
LYMPH%: 27.6 % (ref 14.0–49.7)
MCH: 29.4 pg (ref 25.1–34.0)
MCHC: 33.4 g/dL (ref 31.5–36.0)
MCV: 88 fL (ref 79.5–101.0)
MONO#: 1.2 10*3/uL — AB (ref 0.1–0.9)
MONO%: 24 % — AB (ref 0.0–14.0)
NEUT%: 45.8 % (ref 38.4–76.8)
NEUTROS ABS: 2.3 10*3/uL (ref 1.5–6.5)
Platelets: 93 10*3/uL — ABNORMAL LOW (ref 145–400)
RBC: 4.59 10*6/uL (ref 3.70–5.45)
RDW: 14.1 % (ref 11.2–14.5)
WBC: 5.1 10*3/uL (ref 3.9–10.3)

## 2015-09-07 LAB — COMPREHENSIVE METABOLIC PANEL (CC13)
ALT: 18 U/L (ref 0–55)
AST: 25 U/L (ref 5–34)
Albumin: 3.9 g/dL (ref 3.5–5.0)
Alkaline Phosphatase: 74 U/L (ref 40–150)
Anion Gap: 8 mEq/L (ref 3–11)
BUN: 9.4 mg/dL (ref 7.0–26.0)
CHLORIDE: 105 meq/L (ref 98–109)
CO2: 24 meq/L (ref 22–29)
Calcium: 9.6 mg/dL (ref 8.4–10.4)
Creatinine: 0.7 mg/dL (ref 0.6–1.1)
EGFR: 81 mL/min/{1.73_m2} — AB (ref >90)
GLUCOSE: 87 mg/dL (ref 70–140)
POTASSIUM: 3.6 meq/L (ref 3.5–5.1)
SODIUM: 137 meq/L (ref 136–145)
Total Bilirubin: 0.42 mg/dL (ref 0.20–1.20)
Total Protein: 7.2 g/dL (ref 6.4–8.3)

## 2015-09-07 NOTE — Progress Notes (Signed)
ANC 2.3 today.  According to perimeters she does not get Granix today.

## 2015-09-08 ENCOUNTER — Other Ambulatory Visit: Payer: Self-pay | Admitting: Oncology

## 2015-09-08 DIAGNOSIS — C561 Malignant neoplasm of right ovary: Secondary | ICD-10-CM

## 2015-09-08 LAB — CA 125: CA 125: 51 U/mL — ABNORMAL HIGH (ref <35)

## 2015-09-09 ENCOUNTER — Other Ambulatory Visit: Payer: Self-pay | Admitting: Radiology

## 2015-09-09 ENCOUNTER — Other Ambulatory Visit: Payer: Self-pay

## 2015-09-09 DIAGNOSIS — C561 Malignant neoplasm of right ovary: Secondary | ICD-10-CM

## 2015-09-09 MED ORDER — LIDOCAINE-PRILOCAINE 2.5-2.5 % EX CREA
TOPICAL_CREAM | CUTANEOUS | Status: DC
Start: 2015-09-09 — End: 2015-12-24

## 2015-09-09 NOTE — Progress Notes (Signed)
Patient Demographics     Patient Name Sex DOB SSN Address Phone    Emily Hull, Emily Hull Female 1941-09-04 [id number] [place]  [address] [phone] [facility]) [phone] (Mobile)      Message  Received: [name] Herrlich, MD  Baruch Merl, RN           Needs EMLA please

## 2015-09-10 ENCOUNTER — Ambulatory Visit (HOSPITAL_BASED_OUTPATIENT_CLINIC_OR_DEPARTMENT_OTHER): Payer: Medicare Other | Admitting: Oncology

## 2015-09-10 ENCOUNTER — Encounter: Payer: Self-pay | Admitting: Oncology

## 2015-09-10 ENCOUNTER — Other Ambulatory Visit (HOSPITAL_BASED_OUTPATIENT_CLINIC_OR_DEPARTMENT_OTHER): Payer: Medicare Other

## 2015-09-10 VITALS — BP 164/76 | HR 75 | Temp 98.0°F | Resp 18 | Ht 62.75 in | Wt 153.2 lb

## 2015-09-10 DIAGNOSIS — I878 Other specified disorders of veins: Secondary | ICD-10-CM | POA: Diagnosis not present

## 2015-09-10 DIAGNOSIS — C561 Malignant neoplasm of right ovary: Secondary | ICD-10-CM | POA: Diagnosis not present

## 2015-09-10 DIAGNOSIS — D701 Agranulocytosis secondary to cancer chemotherapy: Secondary | ICD-10-CM

## 2015-09-10 DIAGNOSIS — T451X5A Adverse effect of antineoplastic and immunosuppressive drugs, initial encounter: Secondary | ICD-10-CM

## 2015-09-10 LAB — COMPREHENSIVE METABOLIC PANEL (CC13)
ALK PHOS: 72 U/L (ref 40–150)
ALT: 18 U/L (ref 0–55)
AST: 20 U/L (ref 5–34)
Albumin: 3.8 g/dL (ref 3.5–5.0)
Anion Gap: 8 mEq/L (ref 3–11)
BUN: 14.4 mg/dL (ref 7.0–26.0)
CALCIUM: 9.8 mg/dL (ref 8.4–10.4)
CHLORIDE: 108 meq/L (ref 98–109)
CO2: 25 mEq/L (ref 22–29)
Creatinine: 0.7 mg/dL (ref 0.6–1.1)
EGFR: 80 mL/min/{1.73_m2} — AB (ref >90)
Glucose: 111 mg/dl (ref 70–140)
POTASSIUM: 3.6 meq/L (ref 3.5–5.1)
SODIUM: 141 meq/L (ref 136–145)
Total Bilirubin: <0.3 mg/dL (ref 0.20–1.20)
Total Protein: 7.2 g/dL (ref 6.4–8.3)

## 2015-09-10 LAB — CBC WITH DIFFERENTIAL/PLATELET
BASO%: 0.8 % (ref 0.0–2.0)
BASOS ABS: 0 10*3/uL (ref 0.0–0.1)
EOS ABS: 0.1 10*3/uL (ref 0.0–0.5)
EOS%: 1 % (ref 0.0–7.0)
HEMATOCRIT: 40.7 % (ref 34.8–46.6)
HGB: 13.6 g/dL (ref 11.6–15.9)
LYMPH%: 29.7 % (ref 14.0–49.7)
MCH: 29.6 pg (ref 25.1–34.0)
MCHC: 33.4 g/dL (ref 31.5–36.0)
MCV: 88.5 fL (ref 79.5–101.0)
MONO#: 0.7 10*3/uL (ref 0.1–0.9)
MONO%: 14.2 % — AB (ref 0.0–14.0)
NEUT#: 2.7 10*3/uL (ref 1.5–6.5)
NEUT%: 54.3 % (ref 38.4–76.8)
Platelets: 102 10*3/uL — ABNORMAL LOW (ref 145–400)
RBC: 4.6 10*6/uL (ref 3.70–5.45)
RDW: 14 % (ref 11.2–14.5)
WBC: 5 10*3/uL (ref 3.9–10.3)
lymph#: 1.5 10*3/uL (ref 0.9–3.3)

## 2015-09-10 NOTE — Progress Notes (Signed)
Medical Oncology  Per [facility] managed care, ok for neulasta by on pro injector  L.Marko Plume, MD

## 2015-09-10 NOTE — Progress Notes (Signed)
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 She was neutropenic by day 11 cycle 1, with recovery of counts after 2 doses granix. She will have PAC by IR 09-11-15. She has been back to Dr Nori Riis "not healing as quickly internally with chemo" and has follow up with him in a couple of weeks.  Patient is feeling fairly well overall now, with no peripheral neuropathy and no nausea, tho some taste changes. Bowels are moving adequately now. She has not been markedly fatigued. She denies abdominal or pelvic pain.  Flu vaccine  08-11-15 PAC planned 09-11-15 Discussed genetics referral, which she will probably want to do in future CA 125 55 on 07-07-15 NO PAIN   ONCOLOGIC HISTORY Patient has no prior history of gyn concerns, last gyn exam with PAP Feb 2015 [facility] [place]). She had light spotting ~ Jul 01, 2015, seen at Urgent Care with pelvic US done in [facility] system 07-02-15 reportedly with uterus 5.5 x 3.1 x 2.9 cm with endometrial stripe 6-74m, a 1.2 cm hypoechoic posterior myometrial mass, right ovary 3.1 x 2.1 x 2.1 cm with 1.9 cm cyst and slightly prominent vascularity, and left ovary not clearly identified with 5.4 x 4.0 x 3.9 cm cystin left adnexa. She was referred to Dr RDory Horn with endometrial biopsy benign findings. CA 125 was 55 on 07-07-15. CT [facility] with 5.2 cm cystic lesion in left adnexa with probable thin internal septation, uterus unremarkable, no ascites, also sigmoid diverticulosis and cholelithiasis. She had laparoscopically assisted vaginal hysterectomy, BSO,  resection of bilateral cystic adnexal masses and peritoneal washings 07-30-15. Intraoperatively there was no abnormality in upper abdomen, no peritoneal lesions, large cystic smooth mass in left adnexa and in right adnexa a hemorrhagic appearing cystic structure adherent to right pelvic sidewall, no ascites. Surgical Pathology ([place] [phone] [phone] with high grade serous carcinoma in right ovary biopsy/ wedge resection and mucinous cystadenoma with benign tube on left, benign cervix and uterus. [facility] washings ([facility] [phone] malignant cells consistent with high grade carcinoma. SHe was seen in consultation by Dr CJosephina Shihon 08-07-15, who recommended proceeding with chemotherapy in preference to completion surgical staging with the delay that would require prior to systemic treatment. Recommendation was 6 cycles of carboplatin taxol with q 3 week dosing, then if CA 125 is normal by completion to repeat CT then.  Dr CJosephina Shihplans to see her again while chemo is in progress. She has had no postoperative complications. First chemo with carboplatin taxol given 08-25-15, with ANC 0.6 on day 11.    Review of systems as above, also: No fever or symptoms of infection. No LE swelling. No bleeding. No SOB or other respiratory symptoms. Beginning to lose hair. Remainder of 10 point Review of Systems negative.  Objective:  Vital signs in last 24 hours:  BP 164/76 mmHg  Pulse 75  Temp(Src) 98 F (36.7 C) (Oral)  Resp 18  Ht 5' 2.75" (1.594 m)  Wt 153 lb 3.2 oz (69.491 kg)  BMI 27.35 kg/m2  SpO2 98%  weight up 2 lbs Alert, oriented and appropriate. Ambulatory without difficulty.  No alopecia  HEENT:PERRL, sclerae not  icteric. Oral mucosa moist without lesions, posterior pharynx clear.  Neck supple. No JVD.  Lymphatics:no cervical,supraclavicular, or inguinal adenopathy Resp: clear to auscultation bilaterally and normal percussion bilaterally Cardio: regular rate  and rhythm. No gallop. GI: soft, nontender, not distended, no mass or organomegaly. Normally active bowel sounds. Surgical incision not remarkable. Musculoskeletal/ Extremities: without pitting edema, cords, tenderness Neuro: no peripheral neuropathy. Otherwise nonfocal . PSYCH appropriate mood and affect Skin without rash, ecchymosis, petechiae   Lab Results:  Results for orders placed or performed in visit on 09/10/15  CBC with Differential  Result Value Ref Range   WBC 5.0 3.9 - 10.3 10e3/uL   NEUT# 2.7 1.5 - 6.5 10e3/uL   HGB 13.6 11.6 - 15.9 g/dL   HCT 40.7 34.8 - 46.6 %   Platelets 102 (L) 145 - 400 10e3/uL   MCV 88.5 79.5 - 101.0 fL   MCH 29.6 25.1 - 34.0 pg   MCHC 33.4 31.5 - 36.0 g/dL   RBC 4.60 3.70 - 5.45 10e6/uL   RDW 14.0 11.2 - 14.5 %   lymph# 1.5 0.9 - 3.3 10e3/uL   MONO# 0.7 0.1 - 0.9 10e3/uL   Eosinophils Absolute 0.1 0.0 - 0.5 10e3/uL   Basophils Absolute 0.0 0.0 - 0.1 10e3/uL   NEUT% 54.3 38.4 - 76.8 %   LYMPH% 29.7 14.0 - 49.7 %   MONO% 14.2 (H) 0.0 - 14.0 %   EOS% 1.0 0.0 - 7.0 %   BASO% 0.8 0.0 - 2.0 %  Comprehensive metabolic panel (Cmet) - [facility]  Result Value Ref Range   Sodium 141 136 - 145 mEq/L   Potassium 3.6 3.5 - 5.1 mEq/L   Chloride 108 98 - 109 mEq/L   CO2 25 22 - 29 mEq/L   Glucose 111 70 - 140 mg/dl   BUN 14.4 7.0 - 26.0 mg/dL   Creatinine 0.7 0.6 - 1.1 mg/dL   Total Bilirubin <0.30 0.20 - 1.20 mg/dL   Alkaline Phosphatase 72 40 - 150 U/L   AST 20 5 - 34 U/L   ALT 18 0 - 55 U/L   Total Protein 7.2 6.4 - 8.3 g/dL   Albumin 3.8 3.5 - 5.0 g/dL   Calcium 9.8 8.4 - 10.4 mg/dL   Anion Gap 8 3 - 11 mEq/L   EGFR 80 (L) >90 ml/min/1.73 m2   CA 125 on 09-07-15 was 51, this having been 55 on 07-07-15  Studies/Results:  No results found.  Medications: I have reviewed the patient's current medications. Following visit, approval obtained for On Pro neulasta. She has EMLA for PAC prn  DISCUSSION All of interval history discussed. Rationale  for genetics testing discussed given serous ovarian carcinoma, even tho family history is not obviously concerning. She will think about this, likely will want referral at a later date given all of medical interventions happening now  Discussed gCSF as daily granix/ neupogen vs neulasta, which can be given as on pro injector without another office trip. She understands that dosing will be higher with the set dose neulasta and that she might have increased aches from this, but fortunately did not have significant aches from taxol cycle 1. 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 Carboplatin taxol begun 08-25-15, q 3 week dosing, total 6 cycles planned. Neutropenic by day 11, recovered  quickly with granix x2, will need gCSF after subsequent cycles. 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 Saw endocrinologist last Oct 2015 (Dr Elijah Birk [place]). 9.minimal remote tobacco 10.elevated cholesterol on lipitor 11.flu vaccine 08-11-15   All questions answered. Chemo and neulasta orders placed. Time spent 30 min including >50% counseling and coordination of care. Cc Dr Nori Riis and Dr Jonni Sanger.  Camesha Farooq P, MD   09/10/2015, 5:32 PM

## 2015-09-11 ENCOUNTER — Encounter (HOSPITAL_COMMUNITY): Payer: Self-pay

## 2015-09-11 ENCOUNTER — Ambulatory Visit (HOSPITAL_COMMUNITY)
Admission: RE | Admit: 2015-09-11 | Discharge: 2015-09-11 | Disposition: A | Discharge status: Home / Self Care | Payer: Medicare Other | Source: Ambulatory Visit | Attending: Oncology | Admitting: Oncology

## 2015-09-11 ENCOUNTER — Other Ambulatory Visit: Payer: Self-pay | Admitting: Oncology

## 2015-09-11 DIAGNOSIS — Z7982 Long term (current) use of aspirin: Secondary | ICD-10-CM | POA: Diagnosis not present

## 2015-09-11 DIAGNOSIS — Z87891 Personal history of nicotine dependence: Secondary | ICD-10-CM | POA: Diagnosis not present

## 2015-09-11 DIAGNOSIS — E7800 Pure hypercholesterolemia, unspecified: Secondary | ICD-10-CM | POA: Insufficient documentation

## 2015-09-11 DIAGNOSIS — C561 Malignant neoplasm of right ovary: Secondary | ICD-10-CM | POA: Insufficient documentation

## 2015-09-11 DIAGNOSIS — C569 Malignant neoplasm of unspecified ovary: Secondary | ICD-10-CM | POA: Diagnosis present

## 2015-09-11 DIAGNOSIS — M8580 Other specified disorders of bone density and structure, unspecified site: Secondary | ICD-10-CM | POA: Diagnosis not present

## 2015-09-11 HISTORY — DX: Pure hypercholesterolemia, unspecified: E78.00

## 2015-09-11 HISTORY — DX: Malignant neoplasm of unspecified ovary: C56.9

## 2015-09-11 HISTORY — DX: Pituitary-dependent Cushing's disease: E24.0

## 2015-09-11 HISTORY — DX: Other specified disorders of bone density and structure, unspecified site: M85.80

## 2015-09-11 LAB — APTT: APTT: 26 s (ref 24–37)

## 2015-09-11 LAB — CBC
HEMATOCRIT: 41.3 % (ref 36.0–46.0)
Hemoglobin: 14 g/dL (ref 12.0–15.0)
MCH: 30 pg (ref 26.0–34.0)
MCHC: 33.9 g/dL (ref 30.0–36.0)
MCV: 88.4 fL (ref 78.0–100.0)
PLATELETS: 100 10*3/uL — AB (ref 150–400)
RBC: 4.67 MIL/uL (ref 3.87–5.11)
RDW: 14.1 % (ref 11.5–15.5)
WBC: 4.8 10*3/uL (ref 4.0–10.5)

## 2015-09-11 LAB — BASIC METABOLIC PANEL
Anion gap: 6 (ref 5–15)
BUN: 17 mg/dL (ref 6–20)
CO2: 23 mmol/L (ref 22–32)
CREATININE: 0.7 mg/dL (ref 0.44–1.00)
Calcium: 9.7 mg/dL (ref 8.9–10.3)
Chloride: 111 mmol/L (ref 101–111)
GFR calc Af Amer: >60 mL/min (ref >60)
GLUCOSE: 95 mg/dL (ref 65–99)
POTASSIUM: 4 mmol/L (ref 3.5–5.1)
Sodium: 140 mmol/L (ref 135–145)

## 2015-09-11 LAB — PROTIME-INR
INR: 0.93 (ref 0.00–1.49)
Prothrombin Time: 12.7 seconds (ref 11.6–15.2)

## 2015-09-11 MED ORDER — LIDOCAINE-EPINEPHRINE 2 %-1:100000 IJ SOLN
INTRAMUSCULAR | Status: AC
  Filled 2015-09-11: qty 1

## 2015-09-11 MED ORDER — FENTANYL CITRATE (PF) 100 MCG/2ML IJ SOLN
INTRAMUSCULAR | Status: AC
  Filled 2015-09-11: qty 2

## 2015-09-11 MED ORDER — CEFAZOLIN SODIUM-DEXTROSE 2-3 GM-% IV SOLR
INTRAVENOUS | Status: AC
Start: 2015-09-11 — End: 2015-09-11
  Filled 2015-09-11: qty 50

## 2015-09-11 MED ORDER — CEFAZOLIN SODIUM-DEXTROSE 2-3 GM-% IV SOLR
2.0000 g | INTRAVENOUS | Status: AC
  Administered 2015-09-11: 2 g via INTRAVENOUS

## 2015-09-11 MED ORDER — HEPARIN SOD (PORK) LOCK FLUSH 100 UNIT/ML IV SOLN
INTRAVENOUS | Status: AC | PRN
  Administered 2015-09-11: 500 [IU]

## 2015-09-11 MED ORDER — SODIUM CHLORIDE 0.9 % IV SOLN
Freq: Once | INTRAVENOUS | Status: AC
  Administered 2015-09-11: 08:00:00 via INTRAVENOUS

## 2015-09-11 MED ORDER — MIDAZOLAM HCL 2 MG/2ML IJ SOLN
INTRAMUSCULAR | Status: AC | PRN
  Administered 2015-09-11: 0.5 mg via INTRAVENOUS
  Administered 2015-09-11: 1 mg via INTRAVENOUS
  Administered 2015-09-11: 0.5 mg via INTRAVENOUS

## 2015-09-11 MED ORDER — FENTANYL CITRATE (PF) 100 MCG/2ML IJ SOLN
INTRAMUSCULAR | Status: AC | PRN
  Administered 2015-09-11: 50 ug via INTRAVENOUS

## 2015-09-11 MED ORDER — HEPARIN SOD (PORK) LOCK FLUSH 100 UNIT/ML IV SOLN
INTRAVENOUS | Status: AC
  Filled 2015-09-11: qty 5

## 2015-09-11 MED ORDER — MIDAZOLAM HCL 2 MG/2ML IJ SOLN
INTRAMUSCULAR | Status: AC
  Filled 2015-09-11: qty 4

## 2015-09-11 NOTE — H&P (Signed)
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 Marko Plume and has been scheduled today for port a catheter placement for chemotherapy. She denies any chest pain, shortness of breath or palpitations. She denies any active signs of bleeding or excessive bruising. She denies any recent infections, fever or chills. The patient denies any history of sleep apnea or chronic oxygen use. She has previously tolerated sedation without complications.    Past Medical History  Diagnosis Date  . Ovarian cancer ([place])   . Cushing's disease ([place])   . Osteopenia   . Elevated cholesterol     Past Surgical History  Procedure Laterality Date  . Vaginal hysterectomy      Allergies: Review of patient's allergies indicates no known allergies.  Medications: Prior to Admission medications   Medication Sig Start Date End Date Taking? Authorizing Provider  aspirin 81 MG EC tablet Take 81 mg by mouth.   Yes Historical Provider, MD  atorvastatin (LIPITOR) 10 MG tablet Take 10 mg by mouth daily.  04/30/15  Yes Historical Provider, MD  Calcium Citrate-Vitamin D (CALCIUM + D PO) Take 1 tablet by mouth daily.   Yes Historical Provider, MD  Coenzyme Q10 (CO Q 10) 100 MG CAPS Take 1 capsule by mouth daily.    Yes Historical Provider, MD  dexamethasone (DECADRON) 4 MG tablet Take 5 tablets with food (= 20 mg) 12 hours prior to chemo and 5 tablets with food ( =20 mg) 6 hours prior to chemo 09/02/15  Yes Lennis [place] Downer, MD  Glucosamine-Chondroit-Vit C-Mn (GLUCOSAMINE 1500 COMPLEX PO) Take 1 capsule by mouth 2 (two) times daily.   Yes Historical Provider, MD  loratadine (CLARITIN) 10 MG tablet Take 10 mg by mouth daily.  07/23/15  Yes Historical Provider,  MD  LORazepam (ATIVAN) 0.5 MG tablet 1 tablet under tongue or by mouth every 6 hours as needed for nausea. Will make drowsy 08/13/15  Yes Lennis [place] Downer, MD  Multiple Vitamin (MULTIVITAMIN) capsule Take 1 capsule by mouth daily.    Yes Historical Provider, MD  Omega-3 Fatty Acids (OMEGA-3 FISH OIL) 300 MG CAPS Take 1 capsule by mouth daily.    Yes Historical Provider, MD  ondansetron (ZOFRAN) 8 MG tablet 1 tablet every 8 hours as needed for nausea. Will not make drowsy 08/13/15  Yes Lennis [place] Downer, MD  vitamin E 400 UNIT capsule Take 400 Units by mouth daily.    Yes Historical Provider, MD  lidocaine-prilocaine (EMLA) cream Apply to Porta-Cath 1-2 hours prior to access as needed 09/09/15   Gordy Levan, MD     History reviewed. No pertinent family history.  Social History   Social History  . Marital Status: Married    Spouse Name: N/A  . Number of Children: N/A  . Years of Education: N/A   Social History Main Topics  . Smoking status: Former Smoker    Types: Cigarettes    Quit date: 11/22/1963  . Smokeless tobacco: None  . Alcohol Use: No  . Drug Use: No  . Sexual Activity: Not Asked   Other Topics Concern  . None   Social History Narrative    Review of Systems: A 12 point ROS discussed and pertinent positives are indicated in the  HPI above.  All other systems are negative.  Review of Systems  Vital Signs: BP 190/75 mmHg  Pulse 82  Temp(Src) 99 F (37.2 C) (Oral)  Resp 18  Ht 5' 2.75" (1.594 m)  Wt 151 lb 9.6 oz (68.765 kg)  BMI 27.06 kg/m2  SpO2 100%  Physical Exam  Constitutional: She is oriented to person, place, and time. No distress.  HENT:  Head: Normocephalic and atraumatic.  Neck: No tracheal deviation present.  Cardiovascular: Normal rate and regular rhythm.  Exam reveals no gallop and no friction rub.   No murmur heard. Pulmonary/Chest: Effort normal and breath sounds normal. No respiratory distress. She has no wheezes. She has no rales.    Neurological: She is alert and oriented to person, place, and time.  Skin: Skin is warm and dry. She is not diaphoretic.    Mallampati Score:  MD Evaluation Airway: WNL Heart: WNL Abdomen: WNL Chest/ Lungs: WNL ASA  Classification: 3 Mallampati/Airway Score: Two  Imaging: No results found.  Labs:  CBC:  Recent Labs  09/04/15 1031 09/07/15 1158 09/10/15 1309 09/11/15 0805  WBC 1.5* 5.1 5.0 4.8  HGB 13.3 13.5 13.6 14.0  HCT 39.9 40.4 40.7 41.3  PLT 127* 93* 102* 100*    COAGS:  Recent Labs  09/11/15 0805  INR 0.93  APTT 26    BMP:  Recent Labs  08/31/15 1041 09/07/15 1158 09/10/15 1309 09/11/15 0805  NA 134* 137 141 140  K 4.3 3.6 3.6 4.0  CL  --   --   --  111  CO2 25 24 25 23   GLUCOSE 92 87 111 95  BUN 18.3 9.4 14.4 17  CALCIUM 9.5 9.6 9.8 9.7  CREATININE 0.8 0.7 0.7 0.70  GFRNONAA  --   --   --  >60  GFRAA  --   --   --  >60    LIVER FUNCTION TESTS:  Recent Labs  08/25/15 0826 08/31/15 1041 09/07/15 1158 09/10/15 1309  BILITOT 0.38 0.82 0.42 <0.30  AST 20 27 25 20   ALT 19 31 18 18   ALKPHOS 67 60 74 72  PROT 7.9 7.4 7.2 7.2  ALBUMIN 4.2 4.1 3.9 3.8   Assessment and Plan: High grade serous carcinoma of right ovary  Seen by Dr. Marko Plume  Scheduled today for image guided port a catheter placement with sedation for chemotherapy  The patient has been NPO, no blood thinners taken, labs and vitals have been reviewed. Risks and Benefits discussed with the patient including, but not limited to bleeding, infection, pneumothorax, or fibrin sheath development and need for additional procedures. All of the patient's questions were answered, patient is agreeable to proceed. Consent signed and in chart.   Thank you for this interesting consult.  I greatly enjoyed meeting Emily Hull and look forward to participating in their care.  A copy of this report was sent to the requesting provider on this date.  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

## 2015-09-11 NOTE — Procedures (Signed)
Successful placement of right IJ approach port-a-cath with tip at the superior caval atrial junction. The catheter is ready for immediate use. No immediate post procedural complications.  Jay Vivaan Helseth, MD Pager #: [phone]   

## 2015-09-11 NOTE — Discharge Instructions (Signed)
Implanted Port Insertion, Care After °Refer to this sheet in the next few weeks. These instructions provide you with information on caring for yourself after your procedure. Your health care provider may also give you more specific instructions. Your treatment has been planned according to current medical practices, but problems sometimes occur. Call your health care provider if you have any problems or questions after your procedure. °WHAT TO EXPECT AFTER THE PROCEDURE °After your procedure, it is typical to have the following:  °· Discomfort at the port insertion site. Ice packs to the area will help. °· Bruising on the skin over the port. This will subside in 3-4 days. °HOME CARE INSTRUCTIONS °· After your port is placed, you will get a manufacturer's information card. The card has information about your port. Keep this card with you at all times.   °· Know what kind of port you have. There are many types of ports available.   °· Wear a medical alert bracelet in case of an emergency. This can help alert health care workers that you have a port.   °· The port can stay in for as long as your health care provider believes it is necessary.   °· A home health care nurse may give medicines and take care of the port.   °· You or a family member can get special training and directions for giving medicine and taking care of the port at home.   °SEEK MEDICAL CARE IF:  °· Your port does not flush or you are unable to get a blood return.   °· You have a fever or chills. °SEEK IMMEDIATE MEDICAL CARE IF: °· You have new fluid or pus coming from your incision.   °· You notice a bad smell coming from your incision site.   °· You have swelling, pain, or more redness at the incision or port site.   °· You have chest pain or shortness of breath. °  °This information is not intended to replace advice given to you by your health care provider. Make sure you discuss any questions you have with your health care provider. °  °Document  Released: 08/28/2013 Document Revised: 11/12/2013 Document Reviewed: 08/28/2013 °Elsevier Interactive Patient Education ©2016 [employer] °Implanted Port Home Guide °An implanted port is a type of central line that is placed under the skin. Central lines are used to provide IV access when treatment or nutrition needs to be given through a person's veins. Implanted ports are used for long-term IV access. An implanted port may be placed because:  °· You need IV medicine that would be irritating to the small veins in your hands or arms.   °· You need long-term IV medicines, such as antibiotics.   °· You need IV nutrition for a long period.   °· You need frequent blood draws for lab tests.   °· You need dialysis.   °Implanted ports are usually placed in the chest area, but they can also be placed in the upper arm, the abdomen, or the leg. An implanted port has two main parts:  °· Reservoir. The reservoir is round and will appear as a small, raised area under your skin. The reservoir is the part where a needle is inserted to give medicines or draw blood.   °· Catheter. The catheter is a thin, flexible tube that extends from the reservoir. The catheter is placed into a large vein. Medicine that is inserted into the reservoir goes into the catheter and then into the vein.   °HOW WILL I CARE FOR MY INCISION SITE? °Do not get the   incision site wet. Bathe or shower as directed by your health care provider.  °HOW IS MY PORT ACCESSED? °Special steps must be taken to access the port:  °· Before the port is accessed, a numbing cream can be placed on the skin. This helps numb the skin over the port site.   °· Your health care provider uses a sterile technique to access the port. °· Your health care provider must put on a mask and sterile gloves. °· The skin over your port is cleaned carefully with an antiseptic and allowed to dry. °· The port is gently pinched between sterile gloves, and a needle is inserted into the  port. °· Only "non-coring" port needles should be used to access the port. Once the port is accessed, a blood return should be checked. This helps ensure that the port is in the vein and is not clogged.   °· If your port needs to remain accessed for a constant infusion, a clear (transparent) bandage will be placed over the needle site. The bandage and needle will need to be changed every week, or as directed by your health care provider.   °· Keep the bandage covering the needle clean and dry. Do not get it wet. Follow your health care provider's instructions on how to take a shower or bath while the port is accessed.   °· If your port does not need to stay accessed, no bandage is needed over the port.   °WHAT IS FLUSHING? °Flushing helps keep the port from getting clogged. Follow your health care provider's instructions on how and when to flush the port. Ports are usually flushed with saline solution or a medicine called heparin. The need for flushing will depend on how the port is used.  °· If the port is used for intermittent medicines or blood draws, the port will need to be flushed:   °· After medicines have been given.   °· After blood has been drawn.   °· As part of routine maintenance.   °· If a constant infusion is running, the port may not need to be flushed.   °HOW LONG WILL MY PORT STAY IMPLANTED? °The port can stay in for as long as your health care provider thinks it is needed. When it is time for the port to come out, surgery will be done to remove it. The procedure is similar to the one performed when the port was put in.  °WHEN SHOULD I SEEK IMMEDIATE MEDICAL CARE? °When you have an implanted port, you should seek immediate medical care if:  °· You notice a bad smell coming from the incision site.   °· You have swelling, redness, or drainage at the incision site.   °· You have more swelling or pain at the port site or the surrounding area.   °· You have a fever that is not controlled with  medicine. °  °This information is not intended to replace advice given to you by your health care provider. Make sure you discuss any questions you have with your health care provider. °  °Document Released: 11/07/2005 Document Revised: 08/28/2013 Document Reviewed: 07/15/2013 °Elsevier Interactive Patient Education ©2016 [employer] Inc[employer] ° ° °Moderate Conscious Sedation, Adult, Care After °Refer to this sheet in the next few weeks. These instructions provide you with information on caring for yourself after your procedure. Your health care provider may also give you more specific instructions. Your treatment has been planned according to current medical practices, but problems sometimes occur. Call your health care provider if you have any problems or questions   after your procedure. °WHAT TO EXPECT AFTER THE PROCEDURE  °After your procedure: °· You may feel sleepy, clumsy, and have poor balance for several hours. °· Vomiting may occur if you eat too soon after the procedure. °HOME CARE INSTRUCTIONS °· Do not participate in any activities where you could become injured for at least 24 hours. Do not: °¨ Drive. °¨ Swim. °¨ Ride a bicycle. °¨ Operate heavy machinery. °¨ Cook. °¨ Use power tools. °¨ Climb ladders. °¨ Work from a high place. °· Do not make important decisions or sign legal documents until you are improved. °· If you vomit, drink water, juice, or soup when you can drink without vomiting. Make sure you have little or no nausea before eating solid foods. °· Only take over-the-counter or prescription medicines for pain, discomfort, or fever as directed by your health care provider. °· Make sure you and your family fully understand everything about the medicines given to you, including what side effects may occur. °· You should not drink alcohol, take sleeping pills, or take medicines that cause drowsiness for at least 24 hours. °· If you smoke, do not smoke without supervision. °· If you are feeling better, you  may resume normal activities 24 hours after you were sedated. °· Keep all appointments with your health care provider. °SEEK MEDICAL CARE IF: °· Your skin is pale or bluish in color. °· You continue to feel nauseous or vomit. °· Your pain is getting worse and is not helped by medicine. °· You have bleeding or swelling. °· You are still sleepy or feeling clumsy after 24 hours. °SEEK IMMEDIATE MEDICAL CARE IF: °· You develop a rash. °· You have difficulty breathing. °· You develop any type of allergic problem. °· You have a fever. °MAKE SURE YOU: °· Understand these instructions. °· Will watch your condition. °· Will get help right away if you are not doing well or get worse. °  °This information is not intended to replace advice given to you by your health care provider. Make sure you discuss any questions you have with your health care provider. °  °Document Released: 08/28/2013 Document Revised: 11/28/2014 Document Reviewed: 08/28/2013 °Elsevier Interactive Patient Education ©2016 [employer] Inc. ° °

## 2015-09-12 ENCOUNTER — Encounter: Payer: Self-pay | Admitting: Oncology

## 2015-09-12 DIAGNOSIS — D701 Agranulocytosis secondary to cancer chemotherapy: Secondary | ICD-10-CM | POA: Insufficient documentation

## 2015-09-12 DIAGNOSIS — T451X5A Adverse effect of antineoplastic and immunosuppressive drugs, initial encounter: Secondary | ICD-10-CM

## 2015-09-15 ENCOUNTER — Ambulatory Visit (HOSPITAL_BASED_OUTPATIENT_CLINIC_OR_DEPARTMENT_OTHER): Payer: Medicare Other

## 2015-09-15 ENCOUNTER — Other Ambulatory Visit (HOSPITAL_BASED_OUTPATIENT_CLINIC_OR_DEPARTMENT_OTHER): Payer: Medicare Other

## 2015-09-15 VITALS — BP 168/87 | HR 84 | Temp 97.8°F | Resp 18

## 2015-09-15 DIAGNOSIS — Z95828 Presence of other vascular implants and grafts: Secondary | ICD-10-CM

## 2015-09-15 DIAGNOSIS — Z5189 Encounter for other specified aftercare: Secondary | ICD-10-CM

## 2015-09-15 DIAGNOSIS — Z5111 Encounter for antineoplastic chemotherapy: Secondary | ICD-10-CM | POA: Diagnosis not present

## 2015-09-15 DIAGNOSIS — C561 Malignant neoplasm of right ovary: Secondary | ICD-10-CM

## 2015-09-15 LAB — CBC WITH DIFFERENTIAL/PLATELET
BASO%: 0 % (ref 0.0–2.0)
Basophils Absolute: 0 10*3/uL (ref 0.0–0.1)
EOS ABS: 0 10*3/uL (ref 0.0–0.5)
EOS%: 0 % (ref 0.0–7.0)
HEMATOCRIT: 39.9 % (ref 34.8–46.6)
HEMOGLOBIN: 13.5 g/dL (ref 11.6–15.9)
LYMPH#: 0.5 10*3/uL — AB (ref 0.9–3.3)
LYMPH%: 12.9 % — AB (ref 14.0–49.7)
MCH: 29.8 pg (ref 25.1–34.0)
MCHC: 33.8 g/dL (ref 31.5–36.0)
MCV: 88.1 fL (ref 79.5–101.0)
MONO#: 0 10*3/uL — AB (ref 0.1–0.9)
MONO%: 0.5 % (ref 0.0–14.0)
NEUT%: 86.6 % — ABNORMAL HIGH (ref 38.4–76.8)
NEUTROS ABS: 3.4 10*3/uL (ref 1.5–6.5)
PLATELETS: 113 10*3/uL — AB (ref 145–400)
RBC: 4.53 10*6/uL (ref 3.70–5.45)
RDW: 14.2 % (ref 11.2–14.5)
WBC: 3.9 10*3/uL (ref 3.9–10.3)

## 2015-09-15 MED ORDER — HEPARIN SOD (PORK) LOCK FLUSH 100 UNIT/ML IV SOLN
500.0000 [IU] | Freq: Once | INTRAVENOUS | Status: DC | PRN
  Filled 2015-09-15: qty 5

## 2015-09-15 MED ORDER — SODIUM CHLORIDE 0.9 % IV SOLN
397.0000 mg | Freq: Once | INTRAVENOUS | Status: AC
  Administered 2015-09-15: 400 mg via INTRAVENOUS
  Filled 2015-09-15: qty 40

## 2015-09-15 MED ORDER — SODIUM CHLORIDE 0.9 % IV SOLN
Freq: Once | INTRAVENOUS | Status: AC
  Administered 2015-09-15: 10:00:00 via INTRAVENOUS

## 2015-09-15 MED ORDER — DIPHENHYDRAMINE HCL 50 MG/ML IJ SOLN
INTRAMUSCULAR | Status: AC
  Filled 2015-09-15: qty 1

## 2015-09-15 MED ORDER — FAMOTIDINE IN NACL 20-0.9 MG/50ML-% IV SOLN
INTRAVENOUS | Status: AC
  Filled 2015-09-15: qty 50

## 2015-09-15 MED ORDER — PEGFILGRASTIM 6 MG/0.6ML ~~LOC~~ PSKT
6.0000 mg | PREFILLED_SYRINGE | Freq: Once | SUBCUTANEOUS | Status: AC
  Administered 2015-09-15: 6 mg via SUBCUTANEOUS
  Filled 2015-09-15: qty 0.6

## 2015-09-15 MED ORDER — SODIUM CHLORIDE 0.9 % IJ SOLN
10.0000 mL | INTRAMUSCULAR | Status: DC | PRN
  Filled 2015-09-15: qty 10

## 2015-09-15 MED ORDER — SODIUM CHLORIDE 0.9 % IV SOLN
Freq: Once | INTRAVENOUS | Status: AC
  Administered 2015-09-15: 11:00:00 via INTRAVENOUS
  Filled 2015-09-15: qty 8

## 2015-09-15 MED ORDER — PACLITAXEL CHEMO INJECTION 300 MG/50ML
175.0000 mg/m2 | Freq: Once | INTRAVENOUS | Status: AC
  Administered 2015-09-15: 312 mg via INTRAVENOUS
  Filled 2015-09-15: qty 52

## 2015-09-15 MED ORDER — SODIUM CHLORIDE 0.9 % IJ SOLN
10.0000 mL | INTRAMUSCULAR | Status: DC | PRN
  Administered 2015-09-15: 10 mL via INTRAVENOUS
  Filled 2015-09-15: qty 10

## 2015-09-15 MED ORDER — FAMOTIDINE IN NACL 20-0.9 MG/50ML-% IV SOLN
20.0000 mg | Freq: Once | INTRAVENOUS | Status: AC
  Administered 2015-09-15: 20 mg via INTRAVENOUS

## 2015-09-15 MED ORDER — DIPHENHYDRAMINE HCL 50 MG/ML IJ SOLN
50.0000 mg | Freq: Once | INTRAMUSCULAR | Status: AC
  Administered 2015-09-15: 50 mg via INTRAVENOUS

## 2015-09-15 NOTE — Patient Instructions (Signed)

## 2015-09-15 NOTE — Patient Instructions (Signed)
[facility] Discharge Instructions for Patients Receiving Chemotherapy  Today you received the following chemotherapy agents: Taxol and Carboplatin  To help prevent nausea and vomiting after your treatment, we encourage you to take your nausea medication as directed.    If you develop nausea and vomiting that is not controlled by your nausea medication, call the clinic.   BELOW ARE SYMPTOMS THAT SHOULD BE REPORTED IMMEDIATELY:  *FEVER GREATER THAN 100.5 F  *CHILLS WITH OR WITHOUT FEVER  NAUSEA AND VOMITING THAT IS NOT CONTROLLED WITH YOUR NAUSEA MEDICATION  *UNUSUAL SHORTNESS OF BREATH  *UNUSUAL BRUISING OR BLEEDING  TENDERNESS IN MOUTH AND THROAT WITH OR WITHOUT PRESENCE OF ULCERS  *URINARY PROBLEMS  *BOWEL PROBLEMS  UNUSUAL RASH Items with * indicate a potential emergency and should be followed up as soon as possible.  Feel free to call the clinic you have any questions or concerns. The clinic phone number is [phone].  Please show the CHEMO ALERT CARD at check-in to the Emergency Department and triage nurse.  Paclitaxel injection What is this medicine? PACLITAXEL (PAK li TAX el) is a chemotherapy drug. It targets fast dividing cells, like cancer cells, and causes these cells to die. This medicine is used to treat ovarian cancer, breast cancer, and other cancers. This medicine may be used for other purposes; ask your health care provider or pharmacist if you have questions. COMMON BRAND NAME(S): Onxol, Taxol What should I tell my health care provider before I take this medicine? They need to know if you have any of these conditions: -blood disorders -irregular heartbeat -infection (especially a virus infection such as chickenpox, cold sores, or herpes) -liver disease -previous or ongoing radiation therapy -an unusual or allergic reaction to paclitaxel, alcohol, polyoxyethylated castor oil, other chemotherapy agents, other medicines, foods, dyes, or  preservatives -pregnant or trying to get pregnant -breast-feeding How should I use this medicine? This drug is given as an infusion into a vein. It is administered in a hospital or clinic by a specially trained health care professional. Talk to your pediatrician regarding the use of this medicine in children. Special care may be needed. Overdosage: If you think you have taken too much of this medicine contact a poison control center or emergency room at once. NOTE: This medicine is only for you. Do not share this medicine with others. What if I miss a dose? It is important not to miss your dose. Call your doctor or health care professional if you are unable to keep an appointment. What may interact with this medicine? Do not take this medicine with any of the following medications: -disulfiram -metronidazole This medicine may also interact with the following medications: -cyclosporine -diazepam -ketoconazole -medicines to increase blood counts like filgrastim, pegfilgrastim, sargramostim -other chemotherapy drugs like cisplatin, doxorubicin, epirubicin, etoposide, teniposide, vincristine -quinidine -testosterone -vaccines -verapamil Talk to your doctor or health care professional before taking any of these medicines: -acetaminophen -aspirin -ibuprofen -ketoprofen -naproxen This list may not describe all possible interactions. Give your health care provider a list of all the medicines, herbs, non-prescription drugs, or dietary supplements you use. Also tell them if you smoke, drink alcohol, or use illegal drugs. Some items may interact with your medicine. What should I watch for while using this medicine? Your condition will be monitored carefully while you are receiving this medicine. You will need important blood work done while you are taking this medicine. This drug may make you feel generally unwell. This is not uncommon, as chemotherapy   can affect healthy cells as well as cancer  cells. Report any side effects. Continue your course of treatment even though you feel ill unless your doctor tells you to stop. In some cases, you may be given additional medicines to help with side effects. Follow all directions for their use. Call your doctor or health care professional for advice if you get a fever, chills or sore throat, or other symptoms of a cold or flu. Do not treat yourself. This drug decreases your body's ability to fight infections. Try to avoid being around people who are sick. This medicine may increase your risk to bruise or bleed. Call your doctor or health care professional if you notice any unusual bleeding. Be careful brushing and flossing your teeth or using a toothpick because you may get an infection or bleed more easily. If you have any dental work done, tell your dentist you are receiving this medicine. Avoid taking products that contain aspirin, acetaminophen, ibuprofen, naproxen, or ketoprofen unless instructed by your doctor. These medicines may hide a fever. Do not become pregnant while taking this medicine. Women should inform their doctor if they wish to become pregnant or think they might be pregnant. There is a potential for serious side effects to an unborn child. Talk to your health care professional or pharmacist for more information. Do not breast-feed an infant while taking this medicine. Men are advised not to father a child while receiving this medicine. What side effects may I notice from receiving this medicine? Side effects that you should report to your doctor or health care professional as soon as possible: -allergic reactions like skin rash, itching or hives, swelling of the face, lips, or tongue -low blood counts - This drug may decrease the number of white blood cells, red blood cells and platelets. You may be at increased risk for infections and bleeding. -signs of infection - fever or chills, cough, sore throat, pain or difficulty passing  urine -signs of decreased platelets or bleeding - bruising, pinpoint red spots on the skin, black, tarry stools, nosebleeds -signs of decreased red blood cells - unusually weak or tired, fainting spells, lightheadedness -breathing problems -chest pain -high or low blood pressure -mouth sores -nausea and vomiting -pain, swelling, redness or irritation at the injection site -pain, tingling, numbness in the hands or feet -slow or irregular heartbeat -swelling of the ankle, feet, hands Side effects that usually do not require medical attention (report to your doctor or health care professional if they continue or are bothersome): -bone pain -complete hair loss including hair on your head, underarms, pubic hair, eyebrows, and eyelashes -changes in the color of fingernails -diarrhea -loosening of the fingernails -loss of appetite -muscle or joint pain -red flush to skin -sweating This list may not describe all possible side effects. Call your doctor for medical advice about side effects. You may report side effects to FDA at 1-800-FDA-1088. Where should I keep my medicine? This drug is given in a hospital or clinic and will not be stored at home. NOTE: This sheet is a summary. It may not cover all possible information. If you have questions about this medicine, talk to your doctor, pharmacist, or health care provider.  2015, [employer]. (2012-12-31 16:41:21)  Carboplatin injection What is this medicine? CARBOPLATIN (KAR boe pla tin) is a chemotherapy drug. It targets fast dividing cells, like cancer cells, and causes these cells to die. This medicine is used to treat ovarian cancer and many other cancers. This medicine may   be used for other purposes; ask your health care provider or pharmacist if you have questions. COMMON BRAND NAME(S): Paraplatin What should I tell my health care provider before I take this medicine? They need to know if you have any of these  conditions: -blood disorders -hearing problems -kidney disease -recent or ongoing radiation therapy -an unusual or allergic reaction to carboplatin, cisplatin, other chemotherapy, other medicines, foods, dyes, or preservatives -pregnant or trying to get pregnant -breast-feeding How should I use this medicine? This drug is usually given as an infusion into a vein. It is administered in a hospital or clinic by a specially trained health care professional. Talk to your pediatrician regarding the use of this medicine in children. Special care may be needed. Overdosage: If you think you have taken too much of this medicine contact a poison control center or emergency room at once. NOTE: This medicine is only for you. Do not share this medicine with others. What if I miss a dose? It is important not to miss a dose. Call your doctor or health care professional if you are unable to keep an appointment. What may interact with this medicine? -medicines for seizures -medicines to increase blood counts like filgrastim, pegfilgrastim, sargramostim -some antibiotics like amikacin, gentamicin, neomycin, streptomycin, tobramycin -vaccines Talk to your doctor or health care professional before taking any of these medicines: -acetaminophen -aspirin -ibuprofen -ketoprofen -naproxen This list may not describe all possible interactions. Give your health care provider a list of all the medicines, herbs, non-prescription drugs, or dietary supplements you use. Also tell them if you smoke, drink alcohol, or use illegal drugs. Some items may interact with your medicine. What should I watch for while using this medicine? Your condition will be monitored carefully while you are receiving this medicine. You will need important blood work done while you are taking this medicine. This drug may make you feel generally unwell. This is not uncommon, as chemotherapy can affect healthy cells as well as cancer cells. Report  any side effects. Continue your course of treatment even though you feel ill unless your doctor tells you to stop. In some cases, you may be given additional medicines to help with side effects. Follow all directions for their use. Call your doctor or health care professional for advice if you get a fever, chills or sore throat, or other symptoms of a cold or flu. Do not treat yourself. This drug decreases your body's ability to fight infections. Try to avoid being around people who are sick. This medicine may increase your risk to bruise or bleed. Call your doctor or health care professional if you notice any unusual bleeding. Be careful brushing and flossing your teeth or using a toothpick because you may get an infection or bleed more easily. If you have any dental work done, tell your dentist you are receiving this medicine. Avoid taking products that contain aspirin, acetaminophen, ibuprofen, naproxen, or ketoprofen unless instructed by your doctor. These medicines may hide a fever. Do not become pregnant while taking this medicine. Women should inform their doctor if they wish to become pregnant or think they might be pregnant. There is a potential for serious side effects to an unborn child. Talk to your health care professional or pharmacist for more information. Do not breast-feed an infant while taking this medicine. What side effects may I notice from receiving this medicine? Side effects that you should report to your doctor or health care professional as soon as possible: -allergic reactions like   reactions like skin rash, itching or hives, swelling of the face, lips, or tongue -signs of infection - fever or chills, cough, sore throat, pain or difficulty passing urine -signs of decreased platelets or bleeding - bruising, pinpoint red spots on the skin, black, tarry stools, nosebleeds -signs of decreased red blood cells - unusually weak or tired, fainting spells, lightheadedness -breathing  problems -changes in hearing -changes in vision -chest pain -high blood pressure -low blood counts - This drug may decrease the number of white blood cells, red blood cells and platelets. You may be at increased risk for infections and bleeding. -nausea and vomiting -pain, swelling, redness or irritation at the injection site -pain, tingling, numbness in the hands or feet -problems with balance, talking, walking -trouble passing urine or change in the amount of urine Side effects that usually do not require medical attention (report to your doctor or health care professional if they continue or are bothersome): -hair loss -loss of appetite -metallic taste in the mouth or changes in taste This list may not describe all possible side effects. Call your doctor for medical advice about side effects. You may report side effects to FDA at 1-800-FDA-1088. Where should I keep my medicine? This drug is given in a hospital or clinic and will not be stored at home. NOTE: This sheet is a summary. It may not cover all possible information. If you have questions about this medicine, talk to your doctor, pharmacist, or health care provider.  2015, [employer] Standard. (2008-02-12 14:38:05)    Pegfilgrastim injection What is this medicine? PEGFILGRASTIM (PEG fil gra stim) is a long-acting granulocyte colony-stimulating factor that stimulates the growth of neutrophils, a type of white blood cell important in the body's fight against infection. It is used to reduce the incidence of fever and infection in patients with certain types of cancer who are receiving chemotherapy that affects the bone marrow, and to increase survival after being exposed to high doses of radiation. This medicine may be used for other purposes; ask your health care provider or pharmacist if you have questions. What should I tell my health care provider before I take this medicine? They need to know if you have any of these  conditions: -kidney disease -latex allergy -ongoing radiation therapy -sickle cell disease -skin reactions to acrylic adhesives (On-Body Injector only) -an unusual or allergic reaction to pegfilgrastim, filgrastim, other medicines, foods, dyes, or preservatives -pregnant or trying to get pregnant -breast-feeding How should I use this medicine? This medicine is for injection under the skin. If you get this medicine at home, you will be taught how to prepare and give the pre-filled syringe or how to use the On-body Injector. Refer to the patient Instructions for Use for detailed instructions. Use exactly as directed. Take your medicine at regular intervals. Do not take your medicine more often than directed. It is important that you put your used needles and syringes in a special sharps container. Do not put them in a trash can. If you do not have a sharps container, call your pharmacist or healthcare provider to get one. Talk to your pediatrician regarding the use of this medicine in children. While this drug may be prescribed for selected conditions, precautions do apply. Overdosage: If you think you have taken too much of this medicine contact a poison control center or emergency room at once. NOTE: This medicine is only for you. Do not share this medicine with others. What if I miss a dose? It is important  not to miss your dose. Call your doctor or health care professional if you miss your dose. If you miss a dose due to an On-body Injector failure or leakage, a new dose should be administered as soon as possible using a single prefilled syringe for manual use. What may interact with this medicine? Interactions have not been studied. Give your health care provider a list of all the medicines, herbs, non-prescription drugs, or dietary supplements you use. Also tell them if you smoke, drink alcohol, or use illegal drugs. Some items may interact with your medicine. This list may not describe all  possible interactions. Give your health care provider a list of all the medicines, herbs, non-prescription drugs, or dietary supplements you use. Also tell them if you smoke, drink alcohol, or use illegal drugs. Some items may interact with your medicine. What should I watch for while using this medicine? You may need blood work done while you are taking this medicine. If you are going to need a MRI, CT scan, or other procedure, tell your doctor that you are using this medicine (On-Body Injector only). What side effects may I notice from receiving this medicine? Side effects that you should report to your doctor or health care professional as soon as possible: -allergic reactions like skin rash, itching or hives, swelling of the face, lips, or tongue -dizziness -fever -pain, redness, or irritation at site where injected -pinpoint red spots on the skin -red or dark-brown urine -shortness of breath or breathing problems -stomach or side pain, or pain at the shoulder -swelling -tiredness -trouble passing urine or change in the amount of urine Side effects that usually do not require medical attention (report to your doctor or health care professional if they continue or are bothersome): -bone pain -muscle pain This list may not describe all possible side effects. Call your doctor for medical advice about side effects. You may report side effects to FDA at 1-800-FDA-1088. Where should I keep my medicine? Keep out of the reach of children. Store pre-filled syringes in a refrigerator between 2 and 8 degrees C (36 and 46 degrees F). Do not freeze. Keep in carton to protect from light. Throw away this medicine if it is left out of the refrigerator for more than 48 hours. Throw away any unused medicine after the expiration date. NOTE: This sheet is a summary. It may not cover all possible information. If you have questions about this medicine, talk to your doctor, pharmacist, or health care  provider.    2016, [employer] Standard. (2014-11-27 14:30:14)

## 2015-09-15 NOTE — Progress Notes (Signed)
Pt received Neulasta Onpro for the first time. Pt watched the video about Neulasta and is aware of s/s that she may experience.

## 2015-09-16 ENCOUNTER — Telehealth: Payer: Self-pay | Admitting: *Deleted

## 2015-09-16 ENCOUNTER — Other Ambulatory Visit: Payer: Self-pay | Admitting: Oncology

## 2015-09-16 NOTE — Telephone Encounter (Signed)
-----   Message from Gordy Levan, MD sent at 09/16/2015 10:02 AM EDT ----- Labs seen and need follow up: please have her hold ASA. Platelets were within parameters for chemo yesterday but a little lower than at previous treatment. Let us know if bleeding or other concerns prior to visit and lab next week

## 2015-09-16 NOTE — Telephone Encounter (Signed)
Pt notified of message below. Verbalized understanding 

## 2015-09-20 ENCOUNTER — Other Ambulatory Visit: Payer: Self-pay | Admitting: Oncology

## 2015-09-20 DIAGNOSIS — C561 Malignant neoplasm of right ovary: Secondary | ICD-10-CM

## 2015-09-24 ENCOUNTER — Other Ambulatory Visit: Payer: Medicare Other

## 2015-09-24 ENCOUNTER — Telehealth: Payer: Self-pay | Admitting: Oncology

## 2015-09-24 ENCOUNTER — Ambulatory Visit (HOSPITAL_BASED_OUTPATIENT_CLINIC_OR_DEPARTMENT_OTHER): Payer: Medicare Other | Admitting: Oncology

## 2015-09-24 ENCOUNTER — Ambulatory Visit: Payer: Medicare Other | Attending: Gynecology | Admitting: Gynecology

## 2015-09-24 ENCOUNTER — Ambulatory Visit: Payer: Medicare Other

## 2015-09-24 ENCOUNTER — Encounter: Payer: Self-pay | Admitting: Gynecology

## 2015-09-24 ENCOUNTER — Encounter: Payer: Self-pay | Admitting: Oncology

## 2015-09-24 ENCOUNTER — Other Ambulatory Visit (HOSPITAL_BASED_OUTPATIENT_CLINIC_OR_DEPARTMENT_OTHER): Payer: Medicare Other

## 2015-09-24 VITALS — BP 96/72 | HR 95 | Temp 98.5°F | Resp 18 | Ht 62.75 in | Wt 151.1 lb

## 2015-09-24 VITALS — BP 144/68 | HR 90 | Temp 97.9°F | Resp 18 | Ht 62.75 in | Wt 151.7 lb

## 2015-09-24 DIAGNOSIS — C561 Malignant neoplasm of right ovary: Secondary | ICD-10-CM

## 2015-09-24 DIAGNOSIS — C7982 Secondary malignant neoplasm of genital organs: Secondary | ICD-10-CM

## 2015-09-24 DIAGNOSIS — M8580 Other specified disorders of bone density and structure, unspecified site: Secondary | ICD-10-CM

## 2015-09-24 DIAGNOSIS — D6959 Other secondary thrombocytopenia: Secondary | ICD-10-CM

## 2015-09-24 DIAGNOSIS — Z95828 Presence of other vascular implants and grafts: Secondary | ICD-10-CM

## 2015-09-24 DIAGNOSIS — D701 Agranulocytosis secondary to cancer chemotherapy: Secondary | ICD-10-CM

## 2015-09-24 DIAGNOSIS — T451X5A Adverse effect of antineoplastic and immunosuppressive drugs, initial encounter: Secondary | ICD-10-CM

## 2015-09-24 DIAGNOSIS — E7800 Pure hypercholesterolemia, unspecified: Secondary | ICD-10-CM

## 2015-09-24 LAB — CBC WITH DIFFERENTIAL/PLATELET
BASO%: 0.4 % (ref 0.0–2.0)
BASOS ABS: 0.1 10*3/uL (ref 0.0–0.1)
EOS%: 0.3 % (ref 0.0–7.0)
Eosinophils Absolute: 0 10*3/uL (ref 0.0–0.5)
HCT: 37.7 % (ref 34.8–46.6)
HEMOGLOBIN: 12.6 g/dL (ref 11.6–15.9)
LYMPH%: 8.1 % — ABNORMAL LOW (ref 14.0–49.7)
MCH: 29.9 pg (ref 25.1–34.0)
MCHC: 33.4 g/dL (ref 31.5–36.0)
MCV: 89.5 fL (ref 79.5–101.0)
MONO#: 1.5 10*3/uL — ABNORMAL HIGH (ref 0.1–0.9)
MONO%: 10.8 % (ref 0.0–14.0)
NEUT%: 80.4 % — ABNORMAL HIGH (ref 38.4–76.8)
NEUTROS ABS: 11.4 10*3/uL — AB (ref 1.5–6.5)
Platelets: 104 10*3/uL — ABNORMAL LOW (ref 145–400)
RBC: 4.21 10*6/uL (ref 3.70–5.45)
RDW: 15.3 % — AB (ref 11.2–14.5)
WBC: 14.1 10*3/uL — AB (ref 3.9–10.3)
lymph#: 1.2 10*3/uL (ref 0.9–3.3)

## 2015-09-24 LAB — COMPREHENSIVE METABOLIC PANEL (CC13)
ALBUMIN: 3.6 g/dL (ref 3.5–5.0)
ALK PHOS: 100 U/L (ref 40–150)
ALT: 23 U/L (ref 0–55)
AST: 19 U/L (ref 5–34)
Anion Gap: 8 mEq/L (ref 3–11)
BUN: 11.4 mg/dL (ref 7.0–26.0)
CO2: 23 mEq/L (ref 22–29)
Calcium: 9.5 mg/dL (ref 8.4–10.4)
Chloride: 109 mEq/L (ref 98–109)
Creatinine: 0.7 mg/dL (ref 0.6–1.1)
EGFR: 85 mL/min/{1.73_m2} — ABNORMAL LOW (ref >90)
GLUCOSE: 108 mg/dL (ref 70–140)
POTASSIUM: 3.8 meq/L (ref 3.5–5.1)
SODIUM: 140 meq/L (ref 136–145)
Total Bilirubin: <0.3 mg/dL (ref 0.20–1.20)
Total Protein: 6.7 g/dL (ref 6.4–8.3)

## 2015-09-24 MED ORDER — SODIUM CHLORIDE 0.9 % IJ SOLN
10.0000 mL | INTRAMUSCULAR | Status: DC | PRN
  Administered 2015-09-24: 10 mL via INTRAVENOUS
  Filled 2015-09-24: qty 10

## 2015-09-24 MED ORDER — HEPARIN SOD (PORK) LOCK FLUSH 100 UNIT/ML IV SOLN
500.0000 [IU] | Freq: Once | INTRAVENOUS | Status: AC
  Administered 2015-09-24: 500 [IU] via INTRAVENOUS
  Filled 2015-09-24: qty 5

## 2015-09-24 NOTE — Progress Notes (Signed)
OFFICE PROGRESS NOTE   September 24, 2015   [employer], [place], ([place], [place], [place] [phone]/ [phone]), Maudie Flakes, gynecology [place], [place])  INTERVAL HISTORY:  Patient is seen, together with husband, in continuing attention to chemotherapy in process for high grade serous carcinoma of ovary, at least IIC tho incompletely staged at surgery 07-30-15. She had PAC placed by IR, then cycle 2 carbo taxol on 09-15-15, with neulasta by [facility] injector. She saw Dr Josephina Shih today and will see him again with CT after completing 6 chemotherapy. She saw Dr Nori Riis on 09-23-15, now completely healed from surgery and will see him again in 6 months.   Patient is pleased with PAC, which functioned well for chemo. She had no aches with taxol or the neulasta, did use lortadine. Bowels are moving regularly with MOM. She has had no nausea tho "taste is off". She has had no rash on hands since washing less aggressively and using more lotion. She has been somewhat fatigued, but hopes to go [place] in [place] on 09[place]30-15.   Flu vaccine 08-11-15 PAC placed by IR 09-11-15 No genetics counseling yet, patient considering CA 125  55 on 07-07-15    ONCOLOGIC HISTORY Patient has no prior history of gyn concerns, last gyn exam with PAP Feb 2015 Da[facility] Eves [place]). She had light spotting ~ Jul 01, 2015, seen at Urgent Care with pelvic US done in [facility] system 07-02-15 reportedly with uterus 5.5 x 3.1 x 2.9 cm with endometrial stripe 6-108m, a 1.2 cm hypoechoic posterior myometrial mass, right ovary 3.1 x 2.1 x 2.1 cm with 1.9 cm cyst and slightly prominent vascularity, and left ovary not clearly identified with 5.4 x 4.0 x 3.9 cm cystin left adnexa. She was referred to Dr RDory Horn with endometrial biopsy benign findings. CA 125 was 55 on 07-07-15. CT [facility] with 5.2 cm cystic lesion in left adnexa with  probable thin internal septation, uterus unremarkable, no ascites, also sigmoid diverticulosis and cholelithiasis. She had laparoscopically assisted vaginal hysterectomy, BSO, resection of bilateral cystic adnexal masses and peritoneal washings 07-30-15. Intraoperatively there was no abnormality in upper abdomen, no peritoneal lesions, large cystic smooth mass in left adnexa and in right adnexa a hemorrhagic appearing cystic structure adherent to right pelvic sidewall, no ascites. Surgical Pathology ([place] [phone] [phone] with high grade serous carcinoma in right ovary biopsy/ wedge resection and mucinous cystadenoma with benign tube on left, benign cervix and uterus. Peritoneal washings ([facility] [phone] malignant cells consistent with high grade carcinoma. SHe was seen in consultation by Dr CJosephina Shihon 08-07-15, who recommended proceeding with chemotherapy in preference to completion surgical staging with the delay that would require prior to systemic treatment. Recommendation was 6 cycles of carboplatin taxol with q 3 week dosing, then if CA 125 is normal by completion to repeat CT then.  Dr CJosephina Shihplans to see her again while chemo is in progress. She has had no postoperative complications. First chemo with carboplatin taxol given 08-25-15, with ANC 0.6 on day 11.    Review of systems as above, also: No fever or symptoms of infection. No bleeding. Bladder ok. No LE swelling Remainder of 10 point Review of Systems negative.  Objective:  Vital signs in last 24 hours:  BP 144/68 mmHg  Pulse 90  Temp(Src) 97.9 F (36.6 C) (Oral)  Resp 18  Ht 5' 2.75" (1.594 m)  Wt 151 lb 11.2 oz (68.811  kg)  BMI 27.08 kg/m2  SpO2 100% Weight down 1 lb Alert, oriented and appropriate. Ambulatory without assistance difficulty.  Alopecia  HEENT:PERRL, sclerae not icteric. Oral mucosa moist without lesions, posterior pharynx clear.  Neck supple. No JVD.  Lymphatics:no  cervical,supraclavicular or inguinal adenopathy Resp: clear to auscultation bilaterally and normal percussion bilaterally Cardio: regular rate and rhythm. No gallop. GI: soft, nontender, not distended, no mass or organomegaly. Normally active bowel sounds. Surgical incisions not remarkable. Musculoskeletal/ Extremities: without pitting edema, cords, tenderness Neuro: no peripheral neuropathy. Otherwise nonfocal Skin without rash, ecchymosis, petechiae Portacath-steristrips still in place, minimal resolving ecchymosis, good position  Lab Results:  Results for orders placed or performed in visit on 09/24/15  CBC with Differential  Result Value Ref Range   WBC 14.1 (H) 3.9 - 10.3 10e3/uL   NEUT# 11.4 (H) 1.5 - 6.5 10e3/uL   HGB 12.6 11.6 - 15.9 g/dL   HCT 37.7 34.8 - 46.6 %   Platelets 104 (L) 145 - 400 10e3/uL   MCV 89.5 79.5 - 101.0 fL   MCH 29.9 25.1 - 34.0 pg   MCHC 33.4 31.5 - 36.0 g/dL   RBC 4.21 3.70 - 5.45 10e6/uL   RDW 15.3 (H) 11.2 - 14.5 %   lymph# 1.2 0.9 - 3.3 10e3/uL   MONO# 1.5 (H) 0.1 - 0.9 10e3/uL   Eosinophils Absolute 0.0 0.0 - 0.5 10e3/uL   Basophils Absolute 0.1 0.0 - 0.1 10e3/uL   NEUT% 80.4 (H) 38.4 - 76.8 %   LYMPH% 8.1 (L) 14.0 - 49.7 %   MONO% 10.8 0.0 - 14.0 %   EOS% 0.3 0.0 - 7.0 %   BASO% 0.4 0.0 - 2.0 %  Comprehensive metabolic panel (Cmet) - [facility]  Result Value Ref Range   Sodium 140 136 - 145 mEq/L   Potassium 3.8 3.5 - 5.1 mEq/L   Chloride 109 98 - 109 mEq/L   CO2 23 22 - 29 mEq/L   Glucose 108 70 - 140 mg/dl   BUN 11.4 7.0 - 26.0 mg/dL   Creatinine 0.7 0.6 - 1.1 mg/dL   Total Bilirubin <0.30 0.20 - 1.20 mg/dL   Alkaline Phosphatase 100 40 - 150 U/L   AST 19 5 - 34 U/L   ALT 23 0 - 55 U/L   Total Protein 6.7 6.4 - 8.3 g/dL   Albumin 3.6 3.5 - 5.0 g/dL   Calcium 9.5 8.4 - 10.4 mg/dL   Anion Gap 8 3 - 11 mEq/L   EGFR 85 (L) >90 ml/min/1.73 m2     Studies/Results:  No results found.  Medications: I have reviewed the patient's current  medications.  DISCUSSION: CBC reviewed with patient, with WBC/ ANC good since neulasta, however platelets may decrease in next week or so and she is cautioned to call if bleeding or excessive bruising. Will check counts on 11-14 and let her know if ok for premed decadron/ treatment on 10-06-15.   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 Carboplatin taxol begun 08-25-15, q 3 week dosing, total 6 cycles planned. Neutropenic by day 11 cycle 1, but has done well with neulasta home injector cycle 2. Will confirm labs ok day prior to 10-06-15 cycle 3, neulasta as previously, and I will see her ~ 10-14 days after. 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 Saw endocrinologist last Oct 2015 (Dr Elijah Birk [place]). 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    All questions answered. Patient and husband know to call if concerns prior to next scheduled visit. Chemo and neulasta orders placed. Cc Dr Nori Riis and Dr Jonni Sanger. Time spent 25 min including >50% counseling and coordination of care.    LIVESAY,LENNIS P, MD   09/24/2015, 3:32 PM

## 2015-09-24 NOTE — Patient Instructions (Signed)
Follow up with Dr Fermin Schwab in January 20 , 2017 , call with any changes , questions or concerns.  Thank you

## 2015-09-24 NOTE — Progress Notes (Signed)
Consult Note: Gyn-Onc   Emily Hull 74 y.o. female  Chief Complaint  Patient presents with  . 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  Plan: The patient will continue on current course of chemotherapy. We will continue to monitor CA-125 values at each cycle. After 6 cycles we would recommend a repeat CT scan of the abdomen and pelvis. I will plan on seeing the patient back following the CT scan. She will continue to aggressively manage constipation and will report if she develops any neuropathy.  Interval history: Patient returns today now having completed 2 cycles of carboplatin and Taxol chemotherapy. Her most recent cycle was October 25. It's noted that after 1 cycle of chemotherapy her CA-125 value fell from 96 units per mL 251 units per mL. Patient had some difficulties with constipation which she seems to have now managed adequately. She has minimal nausea and has some fatigue. She denies any peripheral neuropathy. Overall she's tolerating the chemotherapy very well.  HPI: 74 year old white married female seen in consultation request of Dr. Dory Horn regarding management of a newly diagnosed high-grade papillary serous carcinoma the ovary. The patient was initially seen for evaluation of vaginal discharge and postmenopausal bleeding. An ultrasound was obtained on 07/02/2015 which showed heterogeneous endometrial echogenicity with a 1.2 cm hypoechoic posterior myometrial mass and endometrial stripe measuring 6-7 mm. In addition the right ovary had a 1.9 cm cystic mass with slightly prominent vascularity. The left ovary appeared to measure 5. 4 x 4 by 3.9 cm and was cystic with grossly normal ovarian flow. There is a tiny amount of free peritoneal fluid in the cul-de-sac. CA-125 is 55 units per mL. Patient underwent a CT scan of the abdomen and pelvis on August  22 showing a 5.2 cm left adnexal mass, colonic diverticulosis gallstones small hiatal hernia. There are no pathologic enlarged lymph nodes and no evidence of peritoneal disease. Patient underwent laparoscopically-assisted total vaginal hysterectomy, bilateral salpingo-oophorectoy.[name] She was found to have a high-grade serous carcinoma arising from the right ovary involving the serosa of the ovary and adjacent fallopian tube. Peritoneal washings were positive. 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 The patient's had an uncomplicated postoperative course.  ( review of the pathology report says that the right ovary contained the malignancy however I wonder if these were mislabeled. Given the 2 imaging studies and Dr. [name] operative note indicating that the malignancy was in the left ovary)  Review of Systems:10 point review of systems is negative except as noted in interval history.   Vitals: Blood pressure 96/72, pulse 95, temperature 98.5 F (36.9 C), temperature source Oral, resp. rate 18, height 5' 2.75" (1.594 m), weight 151 lb 1.6 oz (68.539 kg), SpO2 100 %.  Physical Exam: General : The patient is a healthy woman in no acute distress.  HEENT: normocephalic, extraoccular movements normal; neck is supple without thyromegally  Lynphnodes: Supraclavicular and inguinal nodes not enlarged  Abdomen: Soft, non-tender, no ascites, no organomegally, no masses, no hernias , incisions are healing well Pelvic: Performed by Dr. Hurley Cisco yesterday.     No Known Allergies  Past Medical History  Diagnosis Date  . Ovarian cancer ([place])   . Cushing's disease ([place])   . Osteopenia   . Elevated cholesterol     Past Surgical History  Procedure  Laterality Date  . Vaginal hysterectomy      Current Outpatient Prescriptions  Medication Sig Dispense Refill  . atorvastatin (LIPITOR) 10 MG tablet Take 10 mg by mouth daily.     . Calcium  Citrate-Vitamin D (CALCIUM + D PO) Take 1 tablet by mouth daily.    . Coenzyme Q10 (CO Q 10) 100 MG CAPS Take 1 capsule by mouth daily.     [name] dexamethasone (DECADRON) 4 MG tablet Take 5 tablets with food (= 20 mg) 12 hours prior to chemo and 5 tablets with food ( =20 mg) 6 hours prior to chemo 20 tablet 1  . Glucosamine-Chondroit-Vit C-Mn (GLUCOSAMINE 1500 COMPLEX PO) Take 1 capsule by mouth 2 (two) times daily.    [name] lidocaine-prilocaine (EMLA) cream Apply to Porta-Cath 1-2 hours prior to access as needed 30 g 1  . loratadine (CLARITIN) 10 MG tablet Take 10 mg by mouth daily.     [name] LORazepam (ATIVAN) 0.5 MG tablet 1 tablet under tongue or by mouth every 6 hours as needed for nausea. Will make drowsy 20 tablet 0  . Multiple Vitamin (MULTIVITAMIN) capsule Take 1 capsule by mouth daily.     . Omega-3 Fatty Acids (OMEGA-3 FISH OIL) 300 MG CAPS Take 1 capsule by mouth daily.     . ondansetron (ZOFRAN) 8 MG tablet 1 tablet every 8 hours as needed for nausea. Will not make drowsy 30 tablet 1  . vitamin E 400 UNIT capsule Take 400 Units by mouth daily.      No current facility-administered medications for this visit.    Social History   Social History  . Marital Status: Married    Spouse Name: N/A  . Number of Children: N/A  . Years of Education: N/A   Occupational History  . Not on file.   Social History Main Topics  . Smoking status: Former Smoker    Types: Cigarettes    Quit date: 11/22/1963  . Smokeless tobacco: Not on file  . Alcohol Use: No  . Drug Use: No  . Sexual Activity: Not on file   Other Topics Concern  . Not on file   Social History Narrative    History reviewed. No pertinent family history.    Alvino Chapel, MD 09/24/2015, 1:43 PM

## 2015-09-24 NOTE — Patient Instructions (Signed)

## 2015-09-24 NOTE — Telephone Encounter (Signed)
Appointments made and avs printed for patient °

## 2015-10-05 ENCOUNTER — Ambulatory Visit: Payer: Medicare Other

## 2015-10-05 ENCOUNTER — Telehealth: Payer: Self-pay

## 2015-10-05 ENCOUNTER — Other Ambulatory Visit (HOSPITAL_BASED_OUTPATIENT_CLINIC_OR_DEPARTMENT_OTHER): Payer: Medicare Other

## 2015-10-05 DIAGNOSIS — C561 Malignant neoplasm of right ovary: Secondary | ICD-10-CM

## 2015-10-05 DIAGNOSIS — Z95828 Presence of other vascular implants and grafts: Secondary | ICD-10-CM

## 2015-10-05 LAB — COMPREHENSIVE METABOLIC PANEL (CC13)
ALT: 16 U/L (ref 0–55)
AST: 15 U/L (ref 5–34)
Albumin: 3.4 g/dL — ABNORMAL LOW (ref 3.5–5.0)
Alkaline Phosphatase: 66 U/L (ref 40–150)
Anion Gap: 7 mEq/L (ref 3–11)
BUN: 12.2 mg/dL (ref 7.0–26.0)
CALCIUM: 9.2 mg/dL (ref 8.4–10.4)
CHLORIDE: 110 meq/L — AB (ref 98–109)
CO2: 24 mEq/L (ref 22–29)
Creatinine: 0.7 mg/dL (ref 0.6–1.1)
EGFR: 87 mL/min/{1.73_m2} — ABNORMAL LOW (ref >90)
Glucose: 96 mg/dl (ref 70–140)
POTASSIUM: 4 meq/L (ref 3.5–5.1)
SODIUM: 141 meq/L (ref 136–145)
Total Bilirubin: 0.36 mg/dL (ref 0.20–1.20)
Total Protein: 6.3 g/dL — ABNORMAL LOW (ref 6.4–8.3)

## 2015-10-05 LAB — CBC WITH DIFFERENTIAL/PLATELET
BASO%: 1.1 % (ref 0.0–2.0)
BASOS ABS: 0 10*3/uL (ref 0.0–0.1)
EOS%: 0.6 % (ref 0.0–7.0)
Eosinophils Absolute: 0 10*3/uL (ref 0.0–0.5)
HEMATOCRIT: 36.4 % (ref 34.8–46.6)
HGB: 12.2 g/dL (ref 11.6–15.9)
LYMPH%: 25.3 % (ref 14.0–49.7)
MCH: 30.3 pg (ref 25.1–34.0)
MCHC: 33.5 g/dL (ref 31.5–36.0)
MCV: 90.3 fL (ref 79.5–101.0)
MONO#: 0.7 10*3/uL (ref 0.1–0.9)
MONO%: 17.5 % — AB (ref 0.0–14.0)
NEUT#: 2.3 10*3/uL (ref 1.5–6.5)
NEUT%: 55.5 % (ref 38.4–76.8)
Platelets: 184 10*3/uL (ref 145–400)
RBC: 4.03 10*6/uL (ref 3.70–5.45)
RDW: 17.1 % — ABNORMAL HIGH (ref 11.2–14.5)
WBC: 4.1 10*3/uL (ref 3.9–10.3)
lymph#: 1 10*3/uL (ref 0.9–3.3)

## 2015-10-05 MED ORDER — HEPARIN SOD (PORK) LOCK FLUSH 100 UNIT/ML IV SOLN
500.0000 [IU] | Freq: Once | INTRAVENOUS | Status: AC
  Administered 2015-10-05: 500 [IU] via INTRAVENOUS
  Filled 2015-10-05: qty 5

## 2015-10-05 MED ORDER — SODIUM CHLORIDE 0.9 % IJ SOLN
10.0000 mL | INTRAMUSCULAR | Status: DC | PRN
  Administered 2015-10-05: 10 mL via INTRAVENOUS
  Filled 2015-10-05: qty 10

## 2015-10-05 NOTE — Telephone Encounter (Signed)
Told Ms Marruffo that her counts were fine for her treatment tomorrow. Tracy 10-05-15  = 2.3; Platelets were 184 K. Ms. Binnie Rail will take her dceadron pre meds 12 and 6 hours prior to Taxol treatment tomorrow at [facility].

## 2015-10-05 NOTE — Telephone Encounter (Signed)
-----   Message from Gordy Levan, MD sent at 09/24/2015  3:58 PM EDT ----- Cbc cmet 11-14, will be at [facility] also that day Need to let her know if ok for Rx 11-15  [place] if [place] >=1.5 and plt >=100k

## 2015-10-06 ENCOUNTER — Other Ambulatory Visit: Payer: Medicare Other

## 2015-10-06 ENCOUNTER — Ambulatory Visit (HOSPITAL_BASED_OUTPATIENT_CLINIC_OR_DEPARTMENT_OTHER): Payer: Medicare Other

## 2015-10-06 VITALS — BP 160/77 | HR 82 | Temp 97.8°F | Resp 20

## 2015-10-06 DIAGNOSIS — Z5111 Encounter for antineoplastic chemotherapy: Secondary | ICD-10-CM

## 2015-10-06 DIAGNOSIS — C561 Malignant neoplasm of right ovary: Secondary | ICD-10-CM | POA: Diagnosis not present

## 2015-10-06 LAB — CA 125: CA 125: 29 U/mL (ref <35)

## 2015-10-06 MED ORDER — SODIUM CHLORIDE 0.9 % IV SOLN
Freq: Once | INTRAVENOUS | Status: AC
  Administered 2015-10-06: 09:00:00 via INTRAVENOUS
  Filled 2015-10-06: qty 8

## 2015-10-06 MED ORDER — SODIUM CHLORIDE 0.9 % IV SOLN
397.0000 mg | Freq: Once | INTRAVENOUS | Status: AC
  Administered 2015-10-06: 400 mg via INTRAVENOUS
  Filled 2015-10-06: qty 40

## 2015-10-06 MED ORDER — PACLITAXEL CHEMO INJECTION 300 MG/50ML
175.0000 mg/m2 | Freq: Once | INTRAVENOUS | Status: AC
  Administered 2015-10-06: 312 mg via INTRAVENOUS
  Filled 2015-10-06: qty 52

## 2015-10-06 MED ORDER — DIPHENHYDRAMINE HCL 50 MG/ML IJ SOLN
INTRAMUSCULAR | Status: AC
  Filled 2015-10-06: qty 1

## 2015-10-06 MED ORDER — SODIUM CHLORIDE 0.9 % IV SOLN
Freq: Once | INTRAVENOUS | Status: AC
  Administered 2015-10-06: 09:00:00 via INTRAVENOUS

## 2015-10-06 MED ORDER — HEPARIN SOD (PORK) LOCK FLUSH 100 UNIT/ML IV SOLN
500.0000 [IU] | Freq: Once | INTRAVENOUS | Status: AC | PRN
  Administered 2015-10-06: 500 [IU]
  Filled 2015-10-06: qty 5

## 2015-10-06 MED ORDER — PEGFILGRASTIM 6 MG/0.6ML ~~LOC~~ PSKT
6.0000 mg | PREFILLED_SYRINGE | Freq: Once | SUBCUTANEOUS | Status: AC
  Administered 2015-10-06: 6 mg via SUBCUTANEOUS
  Filled 2015-10-06: qty 0.6

## 2015-10-06 MED ORDER — DIPHENHYDRAMINE HCL 50 MG/ML IJ SOLN
50.0000 mg | Freq: Once | INTRAMUSCULAR | Status: AC
  Administered 2015-10-06: 50 mg via INTRAVENOUS

## 2015-10-06 MED ORDER — SODIUM CHLORIDE 0.9 % IJ SOLN
10.0000 mL | INTRAMUSCULAR | Status: DC | PRN
  Administered 2015-10-06: 10 mL
  Filled 2015-10-06: qty 10

## 2015-10-06 MED ORDER — FAMOTIDINE IN NACL 20-0.9 MG/50ML-% IV SOLN
INTRAVENOUS | Status: AC
Start: 2015-10-06 — End: 2015-10-06
  Filled 2015-10-06: qty 50

## 2015-10-06 MED ORDER — FAMOTIDINE IN NACL 20-0.9 MG/50ML-% IV SOLN
20.0000 mg | Freq: Once | INTRAVENOUS | Status: AC
  Administered 2015-10-06: 20 mg via INTRAVENOUS

## 2015-10-06 NOTE — Patient Instructions (Signed)
[facility] Discharge Instructions for Patients Receiving Chemotherapy  Today you received the following chemotherapy agents: Taxol and Carboplatin  To help prevent nausea and vomiting after your treatment, we encourage you to take your nausea medication as directed.    If you develop nausea and vomiting that is not controlled by your nausea medication, call the clinic.   BELOW ARE SYMPTOMS THAT SHOULD BE REPORTED IMMEDIATELY:  *FEVER GREATER THAN 100.5 F  *CHILLS WITH OR WITHOUT FEVER  NAUSEA AND VOMITING THAT IS NOT CONTROLLED WITH YOUR NAUSEA MEDICATION  *UNUSUAL SHORTNESS OF BREATH  *UNUSUAL BRUISING OR BLEEDING  TENDERNESS IN MOUTH AND THROAT WITH OR WITHOUT PRESENCE OF ULCERS  *URINARY PROBLEMS  *BOWEL PROBLEMS  UNUSUAL RASH Items with * indicate a potential emergency and should be followed up as soon as possible.  Feel free to call the clinic you have any questions or concerns. The clinic phone number is [phone].  Please show the CHEMO ALERT CARD at check-in to the Emergency Department and triage nurse.  Paclitaxel injection What is this medicine? PACLITAXEL (PAK li TAX el) is a chemotherapy drug. It targets fast dividing cells, like cancer cells, and causes these cells to die. This medicine is used to treat ovarian cancer, breast cancer, and other cancers. This medicine may be used for other purposes; ask your health care provider or pharmacist if you have questions. COMMON BRAND NAME(S): Onxol, Taxol What should I tell my health care provider before I take this medicine? They need to know if you have any of these conditions: -blood disorders -irregular heartbeat -infection (especially a virus infection such as chickenpox, cold sores, or herpes) -liver disease -previous or ongoing radiation therapy -an unusual or allergic reaction to paclitaxel, alcohol, polyoxyethylated castor oil, other chemotherapy agents, other medicines, foods, dyes, or  preservatives -pregnant or trying to get pregnant -breast-feeding How should I use this medicine? This drug is given as an infusion into a vein. It is administered in a hospital or clinic by a specially trained health care professional. Talk to your pediatrician regarding the use of this medicine in children. Special care may be needed. Overdosage: If you think you have taken too much of this medicine contact a poison control center or emergency room at once. NOTE: This medicine is only for you. Do not share this medicine with others. What if I miss a dose? It is important not to miss your dose. Call your doctor or health care professional if you are unable to keep an appointment. What may interact with this medicine? Do not take this medicine with any of the following medications: -disulfiram -metronidazole This medicine may also interact with the following medications: -cyclosporine -diazepam -ketoconazole -medicines to increase blood counts like filgrastim, pegfilgrastim, sargramostim -other chemotherapy drugs like cisplatin, doxorubicin, epirubicin, etoposide, teniposide, vincristine -quinidine -testosterone -vaccines -verapamil Talk to your doctor or health care professional before taking any of these medicines: -acetaminophen -aspirin -ibuprofen -ketoprofen -naproxen This list may not describe all possible interactions. Give your health care provider a list of all the medicines, herbs, non-prescription drugs, or dietary supplements you use. Also tell them if you smoke, drink alcohol, or use illegal drugs. Some items may interact with your medicine. What should I watch for while using this medicine? Your condition will be monitored carefully while you are receiving this medicine. You will need important blood work done while you are taking this medicine. This drug may make you feel generally unwell. This is not uncommon, as chemotherapy   can affect healthy cells as well as cancer  cells. Report any side effects. Continue your course of treatment even though you feel ill unless your doctor tells you to stop. In some cases, you may be given additional medicines to help with side effects. Follow all directions for their use. Call your doctor or health care professional for advice if you get a fever, chills or sore throat, or other symptoms of a cold or flu. Do not treat yourself. This drug decreases your body's ability to fight infections. Try to avoid being around people who are sick. This medicine may increase your risk to bruise or bleed. Call your doctor or health care professional if you notice any unusual bleeding. Be careful brushing and flossing your teeth or using a toothpick because you may get an infection or bleed more easily. If you have any dental work done, tell your dentist you are receiving this medicine. Avoid taking products that contain aspirin, acetaminophen, ibuprofen, naproxen, or ketoprofen unless instructed by your doctor. These medicines may hide a fever. Do not become pregnant while taking this medicine. Women should inform their doctor if they wish to become pregnant or think they might be pregnant. There is a potential for serious side effects to an unborn child. Talk to your health care professional or pharmacist for more information. Do not breast-feed an infant while taking this medicine. Men are advised not to father a child while receiving this medicine. What side effects may I notice from receiving this medicine? Side effects that you should report to your doctor or health care professional as soon as possible: -allergic reactions like skin rash, itching or hives, swelling of the face, lips, or tongue -low blood counts - This drug may decrease the number of white blood cells, red blood cells and platelets. You may be at increased risk for infections and bleeding. -signs of infection - fever or chills, cough, sore throat, pain or difficulty passing  urine -signs of decreased platelets or bleeding - bruising, pinpoint red spots on the skin, black, tarry stools, nosebleeds -signs of decreased red blood cells - unusually weak or tired, fainting spells, lightheadedness -breathing problems -chest pain -high or low blood pressure -mouth sores -nausea and vomiting -pain, swelling, redness or irritation at the injection site -pain, tingling, numbness in the hands or feet -slow or irregular heartbeat -swelling of the ankle, feet, hands Side effects that usually do not require medical attention (report to your doctor or health care professional if they continue or are bothersome): -bone pain -complete hair loss including hair on your head, underarms, pubic hair, eyebrows, and eyelashes -changes in the color of fingernails -diarrhea -loosening of the fingernails -loss of appetite -muscle or joint pain -red flush to skin -sweating This list may not describe all possible side effects. Call your doctor for medical advice about side effects. You may report side effects to FDA at 1-800-FDA-1088. Where should I keep my medicine? This drug is given in a hospital or clinic and will not be stored at home. NOTE: This sheet is a summary. It may not cover all possible information. If you have questions about this medicine, talk to your doctor, pharmacist, or health care provider.  2015, [employer]. (2012-12-31 16:41:21)  Carboplatin injection What is this medicine? CARBOPLATIN (KAR boe pla tin) is a chemotherapy drug. It targets fast dividing cells, like cancer cells, and causes these cells to die. This medicine is used to treat ovarian cancer and many other cancers. This medicine may   be used for other purposes; ask your health care provider or pharmacist if you have questions. COMMON BRAND NAME(S): Paraplatin What should I tell my health care provider before I take this medicine? They need to know if you have any of these  conditions: -blood disorders -hearing problems -kidney disease -recent or ongoing radiation therapy -an unusual or allergic reaction to carboplatin, cisplatin, other chemotherapy, other medicines, foods, dyes, or preservatives -pregnant or trying to get pregnant -breast-feeding How should I use this medicine? This drug is usually given as an infusion into a vein. It is administered in a hospital or clinic by a specially trained health care professional. Talk to your pediatrician regarding the use of this medicine in children. Special care may be needed. Overdosage: If you think you have taken too much of this medicine contact a poison control center or emergency room at once. NOTE: This medicine is only for you. Do not share this medicine with others. What if I miss a dose? It is important not to miss a dose. Call your doctor or health care professional if you are unable to keep an appointment. What may interact with this medicine? -medicines for seizures -medicines to increase blood counts like filgrastim, pegfilgrastim, sargramostim -some antibiotics like amikacin, gentamicin, neomycin, streptomycin, tobramycin -vaccines Talk to your doctor or health care professional before taking any of these medicines: -acetaminophen -aspirin -ibuprofen -ketoprofen -naproxen This list may not describe all possible interactions. Give your health care provider a list of all the medicines, herbs, non-prescription drugs, or dietary supplements you use. Also tell them if you smoke, drink alcohol, or use illegal drugs. Some items may interact with your medicine. What should I watch for while using this medicine? Your condition will be monitored carefully while you are receiving this medicine. You will need important blood work done while you are taking this medicine. This drug may make you feel generally unwell. This is not uncommon, as chemotherapy can affect healthy cells as well as cancer cells. Report  any side effects. Continue your course of treatment even though you feel ill unless your doctor tells you to stop. In some cases, you may be given additional medicines to help with side effects. Follow all directions for their use. Call your doctor or health care professional for advice if you get a fever, chills or sore throat, or other symptoms of a cold or flu. Do not treat yourself. This drug decreases your body's ability to fight infections. Try to avoid being around people who are sick. This medicine may increase your risk to bruise or bleed. Call your doctor or health care professional if you notice any unusual bleeding. Be careful brushing and flossing your teeth or using a toothpick because you may get an infection or bleed more easily. If you have any dental work done, tell your dentist you are receiving this medicine. Avoid taking products that contain aspirin, acetaminophen, ibuprofen, naproxen, or ketoprofen unless instructed by your doctor. These medicines may hide a fever. Do not become pregnant while taking this medicine. Women should inform their doctor if they wish to become pregnant or think they might be pregnant. There is a potential for serious side effects to an unborn child. Talk to your health care professional or pharmacist for more information. Do not breast-feed an infant while taking this medicine. What side effects may I notice from receiving this medicine? Side effects that you should report to your doctor or health care professional as soon as possible: -allergic reactions like   reactions like skin rash, itching or hives, swelling of the face, lips, or tongue -signs of infection - fever or chills, cough, sore throat, pain or difficulty passing urine -signs of decreased platelets or bleeding - bruising, pinpoint red spots on the skin, black, tarry stools, nosebleeds -signs of decreased red blood cells - unusually weak or tired, fainting spells, lightheadedness -breathing  problems -changes in hearing -changes in vision -chest pain -high blood pressure -low blood counts - This drug may decrease the number of white blood cells, red blood cells and platelets. You may be at increased risk for infections and bleeding. -nausea and vomiting -pain, swelling, redness or irritation at the injection site -pain, tingling, numbness in the hands or feet -problems with balance, talking, walking -trouble passing urine or change in the amount of urine Side effects that usually do not require medical attention (report to your doctor or health care professional if they continue or are bothersome): -hair loss -loss of appetite -metallic taste in the mouth or changes in taste This list may not describe all possible side effects. Call your doctor for medical advice about side effects. You may report side effects to FDA at 1-800-FDA-1088. Where should I keep my medicine? This drug is given in a hospital or clinic and will not be stored at home. NOTE: This sheet is a summary. It may not cover all possible information. If you have questions about this medicine, talk to your doctor, pharmacist, or health care provider.  2015, [employer] Standard. (2008-02-12 14:38:05)    Pegfilgrastim injection What is this medicine? PEGFILGRASTIM (PEG fil gra stim) is a long-acting granulocyte colony-stimulating factor that stimulates the growth of neutrophils, a type of white blood cell important in the body's fight against infection. It is used to reduce the incidence of fever and infection in patients with certain types of cancer who are receiving chemotherapy that affects the bone marrow, and to increase survival after being exposed to high doses of radiation. This medicine may be used for other purposes; ask your health care provider or pharmacist if you have questions. What should I tell my health care provider before I take this medicine? They need to know if you have any of these  conditions: -kidney disease -latex allergy -ongoing radiation therapy -sickle cell disease -skin reactions to acrylic adhesives (On-Body Injector only) -an unusual or allergic reaction to pegfilgrastim, filgrastim, other medicines, foods, dyes, or preservatives -pregnant or trying to get pregnant -breast-feeding How should I use this medicine? This medicine is for injection under the skin. If you get this medicine at home, you will be taught how to prepare and give the pre-filled syringe or how to use the On-body Injector. Refer to the patient Instructions for Use for detailed instructions. Use exactly as directed. Take your medicine at regular intervals. Do not take your medicine more often than directed. It is important that you put your used needles and syringes in a special sharps container. Do not put them in a trash can. If you do not have a sharps container, call your pharmacist or healthcare provider to get one. Talk to your pediatrician regarding the use of this medicine in children. While this drug may be prescribed for selected conditions, precautions do apply. Overdosage: If you think you have taken too much of this medicine contact a poison control center or emergency room at once. NOTE: This medicine is only for you. Do not share this medicine with others. What if I miss a dose? It is important   not to miss your dose. Call your doctor or health care professional if you miss your dose. If you miss a dose due to an On-body Injector failure or leakage, a new dose should be administered as soon as possible using a single prefilled syringe for manual use. What may interact with this medicine? Interactions have not been studied. Give your health care provider a list of all the medicines, herbs, non-prescription drugs, or dietary supplements you use. Also tell them if you smoke, drink alcohol, or use illegal drugs. Some items may interact with your medicine. This list may not describe all  possible interactions. Give your health care provider a list of all the medicines, herbs, non-prescription drugs, or dietary supplements you use. Also tell them if you smoke, drink alcohol, or use illegal drugs. Some items may interact with your medicine. What should I watch for while using this medicine? You may need blood work done while you are taking this medicine. If you are going to need a MRI, CT scan, or other procedure, tell your doctor that you are using this medicine (On-Body Injector only). What side effects may I notice from receiving this medicine? Side effects that you should report to your doctor or health care professional as soon as possible: -allergic reactions like skin rash, itching or hives, swelling of the face, lips, or tongue -dizziness -fever -pain, redness, or irritation at site where injected -pinpoint red spots on the skin -red or dark-brown urine -shortness of breath or breathing problems -stomach or side pain, or pain at the shoulder -swelling -tiredness -trouble passing urine or change in the amount of urine Side effects that usually do not require medical attention (report to your doctor or health care professional if they continue or are bothersome): -bone pain -muscle pain This list may not describe all possible side effects. Call your doctor for medical advice about side effects. You may report side effects to FDA at 1-800-FDA-1088. Where should I keep my medicine? Keep out of the reach of children. Store pre-filled syringes in a refrigerator between 2 and 8 degrees C (36 and 46 degrees F). Do not freeze. Keep in carton to protect from light. Throw away this medicine if it is left out of the refrigerator for more than 48 hours. Throw away any unused medicine after the expiration date. NOTE: This sheet is a summary. It may not cover all possible information. If you have questions about this medicine, talk to your doctor, pharmacist, or health care  provider.    2016, [employer]. (2014-11-27 14:30:14)  

## 2015-10-14 ENCOUNTER — Other Ambulatory Visit: Payer: Self-pay

## 2015-10-14 DIAGNOSIS — C561 Malignant neoplasm of right ovary: Secondary | ICD-10-CM

## 2015-10-18 ENCOUNTER — Other Ambulatory Visit: Payer: Self-pay | Admitting: Oncology

## 2015-10-19 ENCOUNTER — Telehealth: Payer: Self-pay | Admitting: Oncology

## 2015-10-19 ENCOUNTER — Other Ambulatory Visit: Payer: Self-pay | Admitting: *Deleted

## 2015-10-19 ENCOUNTER — Other Ambulatory Visit (HOSPITAL_BASED_OUTPATIENT_CLINIC_OR_DEPARTMENT_OTHER): Payer: Medicare Other

## 2015-10-19 ENCOUNTER — Ambulatory Visit (HOSPITAL_BASED_OUTPATIENT_CLINIC_OR_DEPARTMENT_OTHER): Payer: Medicare Other | Admitting: Oncology

## 2015-10-19 ENCOUNTER — Ambulatory Visit (HOSPITAL_BASED_OUTPATIENT_CLINIC_OR_DEPARTMENT_OTHER): Payer: Medicare Other

## 2015-10-19 ENCOUNTER — Encounter: Payer: Self-pay | Admitting: Oncology

## 2015-10-19 VITALS — BP 150/71 | HR 88 | Temp 98.3°F | Resp 18 | Ht 62.75 in | Wt 151.4 lb

## 2015-10-19 DIAGNOSIS — C561 Malignant neoplasm of right ovary: Secondary | ICD-10-CM

## 2015-10-19 DIAGNOSIS — D6959 Other secondary thrombocytopenia: Secondary | ICD-10-CM

## 2015-10-19 DIAGNOSIS — M8580 Other specified disorders of bone density and structure, unspecified site: Secondary | ICD-10-CM

## 2015-10-19 DIAGNOSIS — D701 Agranulocytosis secondary to cancer chemotherapy: Secondary | ICD-10-CM

## 2015-10-19 DIAGNOSIS — L659 Nonscarring hair loss, unspecified: Secondary | ICD-10-CM

## 2015-10-19 DIAGNOSIS — Z95828 Presence of other vascular implants and grafts: Secondary | ICD-10-CM

## 2015-10-19 DIAGNOSIS — T451X5A Adverse effect of antineoplastic and immunosuppressive drugs, initial encounter: Secondary | ICD-10-CM

## 2015-10-19 DIAGNOSIS — E7800 Pure hypercholesterolemia, unspecified: Secondary | ICD-10-CM

## 2015-10-19 LAB — COMPREHENSIVE METABOLIC PANEL (CC13)
ALT: 14 U/L (ref 0–55)
ANION GAP: 9 meq/L (ref 3–11)
AST: 17 U/L (ref 5–34)
Albumin: 3.7 g/dL (ref 3.5–5.0)
Alkaline Phosphatase: 93 U/L (ref 40–150)
BUN: 18.3 mg/dL (ref 7.0–26.0)
CALCIUM: 9.6 mg/dL (ref 8.4–10.4)
CHLORIDE: 110 meq/L — AB (ref 98–109)
CO2: 23 meq/L (ref 22–29)
Creatinine: 0.7 mg/dL (ref 0.6–1.1)
EGFR: 81 mL/min/{1.73_m2} — AB (ref >90)
Glucose: 104 mg/dl (ref 70–140)
Potassium: 3.6 mEq/L (ref 3.5–5.1)
Sodium: 142 mEq/L (ref 136–145)
TOTAL PROTEIN: 7.2 g/dL (ref 6.4–8.3)

## 2015-10-19 LAB — CBC WITH DIFFERENTIAL/PLATELET
BASO%: 0.4 % (ref 0.0–2.0)
Basophils Absolute: 0 10*3/uL (ref 0.0–0.1)
EOS ABS: 0 10*3/uL (ref 0.0–0.5)
EOS%: 0.4 % (ref 0.0–7.0)
HEMATOCRIT: 37.4 % (ref 34.8–46.6)
HGB: 12.5 g/dL (ref 11.6–15.9)
LYMPH%: 11.9 % — AB (ref 14.0–49.7)
MCH: 30.5 pg (ref 25.1–34.0)
MCHC: 33.4 g/dL (ref 31.5–36.0)
MCV: 91.2 fL (ref 79.5–101.0)
MONO#: 0.6 10*3/uL (ref 0.1–0.9)
MONO%: 5.6 % (ref 0.0–14.0)
NEUT#: 8.5 10*3/uL — ABNORMAL HIGH (ref 1.5–6.5)
NEUT%: 81.7 % — AB (ref 38.4–76.8)
PLATELETS: 112 10*3/uL — AB (ref 145–400)
RBC: 4.1 10*6/uL (ref 3.70–5.45)
RDW: 17.2 % — ABNORMAL HIGH (ref 11.2–14.5)
WBC: 10.4 10*3/uL — ABNORMAL HIGH (ref 3.9–10.3)
lymph#: 1.2 10*3/uL (ref 0.9–3.3)

## 2015-10-19 MED ORDER — DEXAMETHASONE 4 MG PO TABS: ORAL_TABLET | ORAL | Status: DC

## 2015-10-19 MED ORDER — LORAZEPAM 0.5 MG PO TABS: ORAL_TABLET | ORAL | Status: DC

## 2015-10-19 MED ORDER — SODIUM CHLORIDE 0.9 % IJ SOLN
10.0000 mL | INTRAMUSCULAR | Status: DC | PRN
  Administered 2015-10-19: 10 mL via INTRAVENOUS
  Filled 2015-10-19: qty 10

## 2015-10-19 MED ORDER — HEPARIN SOD (PORK) LOCK FLUSH 100 UNIT/ML IV SOLN
500.0000 [IU] | Freq: Once | INTRAVENOUS | Status: AC
  Administered 2015-10-19: 500 [IU] via INTRAVENOUS
  Filled 2015-10-19: qty 5

## 2015-10-19 NOTE — Telephone Encounter (Signed)
Gave patient avs report and appointments for December thru February. [facility] will call re ct - patient aware.

## 2015-10-19 NOTE — Patient Instructions (Signed)

## 2015-10-19 NOTE — Telephone Encounter (Signed)
Added genetics appointment for January per 11/28 pof. Left message for patient re appointment for 12/15/15 and patient to get new schedule 12.6. Patient given all other appointments prior to leaving today.

## 2015-10-19 NOTE — Progress Notes (Signed)
OFFICE PROGRESS NOTE   October 19, 2015   [place], [place], ([place], [place], [place] [phone]/ [phone]), Maudie Flakes, gynecology [place], [place]). 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 She had cycle 3 carboplatin taxol on 10-06-15 with neulasta by On Pro injector.  Patient tolerated most recent chemotherapy reasonably well, with bowels moving regularly using MOM around the chemotherapy and po intake better with some additional antiemetics. She has no peripheral neuropathy. The PAC has functioned with no difficulty. She did not have severe taxol/ gCSF aches and has not been markedly fatigued. No fever or symptoms of infection. No bleeding. No abdominal or pelvic pain. No SOB or other respiratory symptoms. No LE swelling. Bladder ok.  She has had a nodular area on left posterior scalp for years, unchanged in size tho now visible with alopeica, and now has bluish coloration. This is not painful, no noted trauma.  Remainder of 10 point Review of Systems unchanged/ negative.     Flu vaccine 08-11-15 PAC placed by IR 09-11-15 Referral placed for genetics counseling CA 125 55 on 07-07-15   ONCOLOGIC HISTORY Patient has no prior history of gyn concerns, last gyn exam with PAP Feb 2015 Daiva Eves [place]). She had light spotting ~ Jul 01, 2015, seen at Urgent Care with pelvic US done in [place] system 07-02-15 reportedly with uterus 5.5 x 3.1 x 2.9 cm with endometrial stripe 6-49m, a 1.2 cm hypoechoic posterior myometrial mass, right ovary 3.1 x 2.1 x 2.1 cm with 1.9 cm cyst and slightly prominent vascularity, and left ovary not clearly identified with 5.4 x 4.0 x 3.9 cm cystin left adnexa. She was referred to Dr RDory Horn with endometrial  biopsy benign findings. CA 125 was 55 on 07-07-15. CT [facility] [place] with 5.2 cm cystic lesion in left adnexa with probable thin internal septation, uterus unremarkable, no ascites, also sigmoid diverticulosis and cholelithiasis. She had laparoscopically assisted vaginal hysterectomy, BSO, resection of bilateral cystic adnexal masses and peritoneal washings 07-30-15. Intraoperatively there was no abnormality in upper abdomen, no peritoneal lesions, large cystic smooth mass in left adnexa and in right adnexa a hemorrhagic appearing cystic structure adherent to right pelvic sidewall, no ascites. Surgical Pathology ([place] [phone] [id number] with high grade serous carcinoma in right ovary biopsy/ wedge resection and mucinous cystadenoma with benign tube on left, benign cervix and uterus. Peritoneal washings ([facility] [phone] malignant cells consistent with high grade carcinoma. SHe was seen in consultation by Dr CJosephina Shihon 08-07-15, who recommended proceeding with chemotherapy in preference to completion surgical staging with the delay that would require prior to systemic treatment. Recommendation was 6 cycles of carboplatin taxol with q 3 week dosing, then if CA 125 is normal by completion to repeat CT then.  Dr CJosephina Shihplans to see her again while chemo is in progress. She has had no postoperative complications. First chemo with carboplatin taxol given 08-25-15, with ANC 0.6 on day 11.   Objective:  Vital signs in last 24 hours:  BP 150/71 mmHg  Pulse 88  Temp(Src) 98.3 F (36.8 C) (Oral)  Resp 18  Ht 5' 2.75" (1.594 m)  Wt 151 lb 6.4 oz (68.675 kg)  BMI 27.03 kg/m2  SpO2 100% Weight stable Alert, oriented and appropriate. Ambulatory without assistance difficulty. Respirations not  labored. Looks comfortable Near complete alopecia  HEENT:PERRL, sclerae not icteric. Oral mucosa moist without lesions, posterior pharynx clear. 1.5 cm slightly raised,  smooth rounded SQ nodule on left posterior scalp, slightly bluish not clearly ecchymosis, not tender and not hard.  Neck supple. No JVD.  Lymphatics:no cervical,supraclavicular or inguinal adenopathy Resp: clear to auscultation bilaterally and normal percussion bilaterally Cardio: regular rate and rhythm. No gallop. GI: soft, nontender, not distended, no mass or organomegaly. Normally active bowel sounds. Surgical incisions not remarkable. Musculoskeletal/ Extremities: without pitting edema, cords, tenderness Neuro: no peripheral neuropathy. Otherwise nonfocal. PSYCH appropriate mood and affect Skin otherwise without rash, ecchymosis, petechiae.  Portacath-without erythema or tenderness  Lab Results:  Results for orders placed or performed in visit on 10/19/15  CBC with Differential  Result Value Ref Range   WBC 10.4 (H) 3.9 - 10.3 10e3/uL   NEUT# 8.5 (H) 1.5 - 6.5 10e3/uL   HGB 12.5 11.6 - 15.9 g/dL   HCT 37.4 34.8 - 46.6 %   Platelets 112 (L) 145 - 400 10e3/uL   MCV 91.2 79.5 - 101.0 fL   MCH 30.5 25.1 - 34.0 pg   MCHC 33.4 31.5 - 36.0 g/dL   RBC 4.10 3.70 - 5.45 10e6/uL   RDW 17.2 (H) 11.2 - 14.5 %   lymph# 1.2 0.9 - 3.3 10e3/uL   MONO# 0.6 0.1 - 0.9 10e3/uL   Eosinophils Absolute 0.0 0.0 - 0.5 10e3/uL   Basophils Absolute 0.0 0.0 - 0.1 10e3/uL   NEUT% 81.7 (H) 38.4 - 76.8 %   LYMPH% 11.9 (L) 14.0 - 49.7 %   MONO% 5.6 0.0 - 14.0 %   EOS% 0.4 0.0 - 7.0 %   BASO% 0.4 0.0 - 2.0 %  Comprehensive metabolic panel (Cmet) - [facility]  Result Value Ref Range   Sodium 142 136 - 145 mEq/L   Potassium 3.6 3.5 - 5.1 mEq/L   Chloride 110 (H) 98 - 109 mEq/L   CO2 23 22 - 29 mEq/L   Glucose 104 70 - 140 mg/dl   BUN 18.3 7.0 - 26.0 mg/dL   Creatinine 0.7 0.6 - 1.1 mg/dL   Total Bilirubin <0.30 0.20 - 1.20 mg/dL   Alkaline Phosphatase 93 40 - 150 U/L   AST 17 5 - 34 U/L   ALT 14 0 - 55 U/L   Total Protein 7.2 6.4 - 8.3 g/dL   Albumin 3.7 3.5 - 5.0 g/dL   Calcium 9.6 8.4 - 10.4 mg/dL    Anion Gap 9 3 - 11 mEq/L   EGFR 81 (L) >90 ml/min/1.73 m2    CA 125 on 10-05-15  29, having been 51 on 09-07-15  Studies/Results:  No results found.  Medications: I have reviewed the patient's current medications.  DISCUSSION: at patient's request and together with son, we have discussed rationale for genetics testing given diagnosis of serous ovarian carcinoma, fact that this is an evolving field but that some genetic mutations are well characterized and allow additional useful surveillance/ prophylactic interventions and some directed therapies. I have explained in general findings of VUS, generally followed up over time in case additional relevant information comes available. We have discussed testing of close relatives if index case has genetic mutation identified. We have discussed testing by blood draw, with analysis taking several weeks. She understands that, even if genetic abnormality is identified, that present treatment would not change. She would like testing done, prefers this in January. Discussed timing of chemo around holidays and she is  fine with treatment on schedule on 12-27.  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 Carboplatin taxol begun 08-25-15, q 3 week dosing, total 6 cycles planned. Neutropenic by day 11 cycle 1, but has done well with neulasta home injector cycle 2. Will have cycle 4 on 10-27-15 as long as ANC >=1.5 and plt >=100k, with on pro neulasta.  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 Saw endocrinologist last Oct 2015 (Dr Elijah Birk  [place]). 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  13.area on scalp present x years, no change size, looks bluish now which apparently is new (tho previously under hair). Does not look malignant, will follow and can ask Dr Denna Haggard to see if needed.  All questions answered. Chemo and neulasta orders confirmed. Time spent 30 min including >50% counseling and coordination of care.cc Dr Nori Riis, Dr Theodoro Kalata, MD   10/19/2015, 5:38 PM

## 2015-10-26 ENCOUNTER — Other Ambulatory Visit: Payer: Self-pay

## 2015-10-26 DIAGNOSIS — C561 Malignant neoplasm of right ovary: Secondary | ICD-10-CM

## 2015-10-27 ENCOUNTER — Other Ambulatory Visit (HOSPITAL_BASED_OUTPATIENT_CLINIC_OR_DEPARTMENT_OTHER): Payer: Medicare Other

## 2015-10-27 ENCOUNTER — Ambulatory Visit: Payer: Medicare Other

## 2015-10-27 ENCOUNTER — Ambulatory Visit (HOSPITAL_BASED_OUTPATIENT_CLINIC_OR_DEPARTMENT_OTHER): Payer: Medicare Other

## 2015-10-27 VITALS — BP 137/67 | HR 76 | Temp 97.6°F | Resp 18

## 2015-10-27 DIAGNOSIS — C561 Malignant neoplasm of right ovary: Secondary | ICD-10-CM | POA: Diagnosis not present

## 2015-10-27 DIAGNOSIS — Z5111 Encounter for antineoplastic chemotherapy: Secondary | ICD-10-CM | POA: Diagnosis not present

## 2015-10-27 DIAGNOSIS — Z95828 Presence of other vascular implants and grafts: Secondary | ICD-10-CM

## 2015-10-27 DIAGNOSIS — D701 Agranulocytosis secondary to cancer chemotherapy: Secondary | ICD-10-CM

## 2015-10-27 LAB — CBC WITH DIFFERENTIAL/PLATELET
BASO%: 0.2 % (ref 0.0–2.0)
BASOS ABS: 0 10*3/uL (ref 0.0–0.1)
EOS%: 0 % (ref 0.0–7.0)
Eosinophils Absolute: 0 10*3/uL (ref 0.0–0.5)
HCT: 37.8 % (ref 34.8–46.6)
HEMOGLOBIN: 13 g/dL (ref 11.6–15.9)
LYMPH%: 11.3 % — AB (ref 14.0–49.7)
MCH: 31.4 pg (ref 25.1–34.0)
MCHC: 34.3 g/dL (ref 31.5–36.0)
MCV: 91.5 fL (ref 79.5–101.0)
MONO#: 0 10*3/uL — ABNORMAL LOW (ref 0.1–0.9)
MONO%: 0.5 % (ref 0.0–14.0)
NEUT#: 3.3 10*3/uL (ref 1.5–6.5)
NEUT%: 88 % — ABNORMAL HIGH (ref 38.4–76.8)
Platelets: 133 10*3/uL — ABNORMAL LOW (ref 145–400)
RBC: 4.13 10*6/uL (ref 3.70–5.45)
RDW: 19.1 % — AB (ref 11.2–14.5)
WBC: 3.8 10*3/uL — ABNORMAL LOW (ref 3.9–10.3)
lymph#: 0.4 10*3/uL — ABNORMAL LOW (ref 0.9–3.3)

## 2015-10-27 LAB — COMPREHENSIVE METABOLIC PANEL
ALBUMIN: 4.1 g/dL (ref 3.5–5.0)
ALT: 12 U/L (ref 0–55)
AST: 18 U/L (ref 5–34)
Alkaline Phosphatase: 80 U/L (ref 40–150)
Anion Gap: 11 mEq/L (ref 3–11)
BUN: 17.6 mg/dL (ref 7.0–26.0)
CHLORIDE: 110 meq/L — AB (ref 98–109)
CO2: 21 meq/L — AB (ref 22–29)
Calcium: 10 mg/dL (ref 8.4–10.4)
Creatinine: 0.8 mg/dL (ref 0.6–1.1)
EGFR: 73 mL/min/{1.73_m2} — AB (ref >90)
GLUCOSE: 203 mg/dL — AB (ref 70–140)
POTASSIUM: 3.9 meq/L (ref 3.5–5.1)
SODIUM: 142 meq/L (ref 136–145)
Total Bilirubin: 0.65 mg/dL (ref 0.20–1.20)
Total Protein: 7.5 g/dL (ref 6.4–8.3)

## 2015-10-27 MED ORDER — PEGFILGRASTIM 6 MG/0.6ML ~~LOC~~ PSKT
6.0000 mg | PREFILLED_SYRINGE | Freq: Once | SUBCUTANEOUS | Status: AC
  Administered 2015-10-27: 6 mg via SUBCUTANEOUS
  Filled 2015-10-27: qty 0.6

## 2015-10-27 MED ORDER — HEPARIN SOD (PORK) LOCK FLUSH 100 UNIT/ML IV SOLN
500.0000 [IU] | Freq: Once | INTRAVENOUS | Status: AC | PRN
  Administered 2015-10-27: 500 [IU]
  Filled 2015-10-27: qty 5

## 2015-10-27 MED ORDER — DEXAMETHASONE SODIUM PHOSPHATE 100 MG/10ML IJ SOLN
Freq: Once | INTRAMUSCULAR | Status: AC
  Administered 2015-10-27: 11:00:00 via INTRAVENOUS
  Filled 2015-10-27: qty 8

## 2015-10-27 MED ORDER — DIPHENHYDRAMINE HCL 50 MG/ML IJ SOLN
50.0000 mg | Freq: Once | INTRAMUSCULAR | Status: AC
  Administered 2015-10-27: 50 mg via INTRAVENOUS

## 2015-10-27 MED ORDER — SODIUM CHLORIDE 0.9 % IV SOLN
Freq: Once | INTRAVENOUS | Status: AC
  Administered 2015-10-27: 11:00:00 via INTRAVENOUS

## 2015-10-27 MED ORDER — SODIUM CHLORIDE 0.9 % IJ SOLN
10.0000 mL | INTRAMUSCULAR | Status: DC | PRN
  Administered 2015-10-27: 10 mL
  Filled 2015-10-27: qty 10

## 2015-10-27 MED ORDER — SODIUM CHLORIDE 0.9 % IJ SOLN
10.0000 mL | INTRAMUSCULAR | Status: DC | PRN
  Administered 2015-10-27: 10 mL via INTRAVENOUS
  Filled 2015-10-27: qty 10

## 2015-10-27 MED ORDER — DEXTROSE 5 % IV SOLN
175.0000 mg/m2 | Freq: Once | INTRAVENOUS | Status: AC
  Administered 2015-10-27: 312 mg via INTRAVENOUS
  Filled 2015-10-27: qty 52

## 2015-10-27 MED ORDER — SODIUM CHLORIDE 0.9 % IV SOLN
393.0000 mg | Freq: Once | INTRAVENOUS | Status: AC
  Administered 2015-10-27: 390 mg via INTRAVENOUS
  Filled 2015-10-27: qty 39

## 2015-10-27 MED ORDER — FAMOTIDINE IN NACL 20-0.9 MG/50ML-% IV SOLN
20.0000 mg | Freq: Once | INTRAVENOUS | Status: AC
  Administered 2015-10-27: 20 mg via INTRAVENOUS

## 2015-10-27 MED ORDER — DIPHENHYDRAMINE HCL 50 MG/ML IJ SOLN
INTRAMUSCULAR | Status: AC
  Filled 2015-10-27: qty 1

## 2015-10-27 MED ORDER — FAMOTIDINE IN NACL 20-0.9 MG/50ML-% IV SOLN
INTRAVENOUS | Status: AC
  Filled 2015-10-27: qty 50

## 2015-10-27 NOTE — Patient Instructions (Signed)
[facility] Discharge Instructions for Patients Receiving Chemotherapy  Today you received the following chemotherapy agents: Taxol, carboplatin  To help prevent nausea and vomiting after your treatment, we encourage you to take your nausea medication as prescribed.    If you develop nausea and vomiting that is not controlled by your nausea medication, call the clinic.   BELOW ARE SYMPTOMS THAT SHOULD BE REPORTED IMMEDIATELY:  *FEVER GREATER THAN 100.5 F  *CHILLS WITH OR WITHOUT FEVER  NAUSEA AND VOMITING THAT IS NOT CONTROLLED WITH YOUR NAUSEA MEDICATION  *UNUSUAL SHORTNESS OF BREATH  *UNUSUAL BRUISING OR BLEEDING  TENDERNESS IN MOUTH AND THROAT WITH OR WITHOUT PRESENCE OF ULCERS  *URINARY PROBLEMS  *BOWEL PROBLEMS  UNUSUAL RASH Items with * indicate a potential emergency and should be followed up as soon as possible.  Feel free to call the clinic you have any questions or concerns. The clinic phone number is [phone].  Please show the CHEMO ALERT CARD at check-in to the Emergency Department and triage nurse.   

## 2015-10-27 NOTE — Patient Instructions (Signed)

## 2015-11-08 ENCOUNTER — Other Ambulatory Visit: Payer: Self-pay | Admitting: Oncology

## 2015-11-08 DIAGNOSIS — C561 Malignant neoplasm of right ovary: Secondary | ICD-10-CM

## 2015-11-09 ENCOUNTER — Ambulatory Visit (HOSPITAL_BASED_OUTPATIENT_CLINIC_OR_DEPARTMENT_OTHER): Payer: Medicare Other | Admitting: Oncology

## 2015-11-09 ENCOUNTER — Ambulatory Visit: Payer: Medicare Other

## 2015-11-09 ENCOUNTER — Other Ambulatory Visit: Payer: Self-pay

## 2015-11-09 ENCOUNTER — Encounter: Payer: Self-pay | Admitting: Oncology

## 2015-11-09 VITALS — BP 142/71 | HR 87 | Temp 98.5°F | Resp 18 | Ht 62.75 in | Wt 149.3 lb

## 2015-11-09 DIAGNOSIS — C561 Malignant neoplasm of right ovary: Secondary | ICD-10-CM

## 2015-11-09 DIAGNOSIS — B370 Candidal stomatitis: Secondary | ICD-10-CM

## 2015-11-09 DIAGNOSIS — D6959 Other secondary thrombocytopenia: Secondary | ICD-10-CM | POA: Diagnosis not present

## 2015-11-09 DIAGNOSIS — D701 Agranulocytosis secondary to cancer chemotherapy: Secondary | ICD-10-CM

## 2015-11-09 DIAGNOSIS — E876 Hypokalemia: Secondary | ICD-10-CM

## 2015-11-09 DIAGNOSIS — Z95828 Presence of other vascular implants and grafts: Secondary | ICD-10-CM

## 2015-11-09 DIAGNOSIS — A084 Viral intestinal infection, unspecified: Secondary | ICD-10-CM | POA: Diagnosis not present

## 2015-11-09 DIAGNOSIS — M8580 Other specified disorders of bone density and structure, unspecified site: Secondary | ICD-10-CM

## 2015-11-09 LAB — CBC WITH DIFFERENTIAL/PLATELET
BASO%: 0.9 % (ref 0.0–2.0)
BASOS ABS: 0.1 10*3/uL (ref 0.0–0.1)
EOS%: 0.2 % (ref 0.0–7.0)
Eosinophils Absolute: 0 10*3/uL (ref 0.0–0.5)
HEMATOCRIT: 35.6 % (ref 34.8–46.6)
HEMOGLOBIN: 11.7 g/dL (ref 11.6–15.9)
LYMPH#: 0.9 10*3/uL (ref 0.9–3.3)
LYMPH%: 8.7 % — ABNORMAL LOW (ref 14.0–49.7)
MCH: 30.6 pg (ref 25.1–34.0)
MCHC: 32.9 g/dL (ref 31.5–36.0)
MCV: 93.1 fL (ref 79.5–101.0)
MONO#: 0.5 10*3/uL (ref 0.1–0.9)
MONO%: 5.5 % (ref 0.0–14.0)
NEUT%: 84.7 % — ABNORMAL HIGH (ref 38.4–76.8)
NEUTROS ABS: 8.5 10*3/uL — AB (ref 1.5–6.5)
Platelets: 126 10*3/uL — ABNORMAL LOW (ref 145–400)
RBC: 3.82 10*6/uL (ref 3.70–5.45)
RDW: 18.1 % — AB (ref 11.2–14.5)
WBC: 10 10*3/uL (ref 3.9–10.3)

## 2015-11-09 LAB — COMPREHENSIVE METABOLIC PANEL
ALBUMIN: 3.6 g/dL (ref 3.5–5.0)
ALK PHOS: 84 U/L (ref 40–150)
ALT: 25 U/L (ref 0–55)
AST: 22 U/L (ref 5–34)
Anion Gap: 10 mEq/L (ref 3–11)
BILIRUBIN TOTAL: 0.37 mg/dL (ref 0.20–1.20)
BUN: 8.4 mg/dL (ref 7.0–26.0)
CALCIUM: 9 mg/dL (ref 8.4–10.4)
CO2: 22 mEq/L (ref 22–29)
CREATININE: 0.7 mg/dL (ref 0.6–1.1)
Chloride: 108 mEq/L (ref 98–109)
EGFR: 81 mL/min/{1.73_m2} — ABNORMAL LOW (ref >90)
GLUCOSE: 107 mg/dL (ref 70–140)
POTASSIUM: 3.4 meq/L — AB (ref 3.5–5.1)
SODIUM: 140 meq/L (ref 136–145)
TOTAL PROTEIN: 6.6 g/dL (ref 6.4–8.3)

## 2015-11-09 MED ORDER — ONDANSETRON HCL 8 MG PO TABS: ORAL_TABLET | ORAL | Status: DC

## 2015-11-09 MED ORDER — SODIUM CHLORIDE 0.9 % IJ SOLN
10.0000 mL | INTRAMUSCULAR | Status: DC | PRN
  Administered 2015-11-09: 10 mL via INTRAVENOUS
  Filled 2015-11-09: qty 10

## 2015-11-09 MED ORDER — HEPARIN SOD (PORK) LOCK FLUSH 100 UNIT/ML IV SOLN
500.0000 [IU] | Freq: Once | INTRAVENOUS | Status: AC
  Administered 2015-11-09: 500 [IU] via INTRAVENOUS
  Filled 2015-11-09: qty 5

## 2015-11-09 NOTE — Progress Notes (Signed)
OFFICE PROGRESS NOTE   November 09, 2015   [place], [place], ([place], [place], [place] [phone]/ [phone]), Emily Hull, gynecology [place], [place]). 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 She had cycle 4 carboplatin taxol on 10-27-15 with neulasta by on pro injector.  Husband and son had viral gastroenteritis initially, then patient had same symptoms beginning 11-05-15, with diarrhea, nausea and no appetite. She had no fever, pushed po fluids, used antiemetics and imodium, did not contact this office. She feels much better today, with last BM loose this AM but no longer watery. Husband's symptoms lasted 5 days.  She had had no new or different symptoms after most recent chemotherapy until GI illness. She has had some coating in mouth, which is also slightly sore, no esophagitis. She denies peripheral neuropathy symptoms or problems with PAC, no bleeding and no other symptoms that seem related to chemo. Remainder of 10 point Review of Systems negative/ unchanged.   Flu vaccine 08-11-15 PAC placed by IR 09-11-15  genetics counseling scheduled for 12-14-14. CA 125 55 on 07-07-15  ONCOLOGIC HISTORY Patient has no prior history of gyn concerns, last gyn exam with PAP Feb 2015 Daiva Eves [place]). She had light spotting ~ Jul 01, 2015, seen at Urgent Care with pelvic US done in [place] system 07-02-15 reportedly with uterus 5.5 x 3.1 x 2.9 cm with endometrial stripe 6-15m, a 1.2 cm hypoechoic posterior myometrial mass, right ovary 3.1 x 2.1 x 2.1 cm with 1.9 cm cyst and slightly prominent vascularity, and left ovary not clearly identified with 5.4 x 4.0 x 3.9 cm cystin left adnexa. She was referred to Dr RDory Horn with endometrial biopsy  benign findings. CA 125 was 55 on 07-07-15. CT [place] with 5.2 cm cystic lesion in left adnexa with probable thin internal septation, uterus unremarkable, no ascites, also sigmoid diverticulosis and cholelithiasis. She had laparoscopically assisted vaginal hysterectomy, BSO, resection of bilateral cystic adnexal masses and peritoneal washings 07-30-15. Intraoperatively there was no abnormality in upper abdomen, no peritoneal lesions, large cystic smooth mass in left adnexa and in right adnexa a hemorrhagic appearing cystic structure adherent to right pelvic sidewall, no ascites. Surgical Pathology ([place] [phone] [phone] with high grade serous carcinoma in right ovary biopsy/ wedge resection and mucinous cystadenoma with benign tube on left, benign cervix and uterus. Peritoneal washings ([facility] [phone] malignant cells consistent with high grade carcinoma. SHe was seen in consultation by Dr CJosephina Shihon 08-07-15, who recommended proceeding with chemotherapy in preference to completion surgical staging with the delay that would require prior to systemic treatment. Recommendation was 6 cycles of carboplatin taxol with q 3 week dosing, then if CA 125 is normal by completion to repeat CT then.  Dr CJosephina Shihplans to see her again while chemo is in progress. She has had no postoperative complications. First chemo with carboplatin taxol given 08-25-15, with ANC 0.6 on day 11.   Objective:  Vital signs in last 24 hours:  BP 142/71 mmHg  Pulse 87  Temp(Src) 98.5 F (36.9 C) (Oral)  Resp 18  Ht 5' 2.75" (1.594 m)  Wt 149 lb 4.8 oz (67.722 kg)  BMI 26.65 kg/m2  SpO2 100% Weight down 2 lbs. Alert, oriented and appropriate. Ambulatory without difficulty. Respirations not labored. Does  not appear acutely ill. Alopecia  HEENT:PERRL, sclerae not icteric. Oral mucosa moist, erythematous and slightly irritated, with possible thrush on palate, posterior pharynx  clear.  Neck supple. No JVD.  Lymphatics:no cervical,supraclavicular or inguinal adenopathy Resp: clear to auscultation bilaterally and normal percussion bilaterally Cardio: regular rate and rhythm. No gallop. GI: soft, nontender, not distended, no mass or organomegaly. Normally active bowel sounds Musculoskeletal/ Extremities: without pitting edema, cords, tenderness Neuro: no significant peripheral neuropathy. Otherwise nonfocal. Psych appropriate mood and affect Skin without rash, ecchymosis, petechiae Portacath-without erythema or tenderness  Lab Results:  Results for orders placed or performed in visit on 11/09/15  CBC with Differential  Result Value Ref Range   WBC 10.0 3.9 - 10.3 10e3/uL   NEUT# 8.5 (H) 1.5 - 6.5 10e3/uL   HGB 11.7 11.6 - 15.9 g/dL   HCT 35.6 34.8 - 46.6 %   Platelets 126 (L) 145 - 400 10e3/uL   MCV 93.1 79.5 - 101.0 fL   MCH 30.6 25.1 - 34.0 pg   MCHC 32.9 31.5 - 36.0 g/dL   RBC 3.82 3.70 - 5.45 10e6/uL   RDW 18.1 (H) 11.2 - 14.5 %   lymph# 0.9 0.9 - 3.3 10e3/uL   MONO# 0.5 0.1 - 0.9 10e3/uL   Eosinophils Absolute 0.0 0.0 - 0.5 10e3/uL   Basophils Absolute 0.1 0.0 - 0.1 10e3/uL   NEUT% 84.7 (H) 38.4 - 76.8 %   LYMPH% 8.7 (L) 14.0 - 49.7 %   MONO% 5.5 0.0 - 14.0 %   EOS% 0.2 0.0 - 7.0 %   BASO% 0.9 0.0 - 2.0 %  Comprehensive metabolic panel  Result Value Ref Range   Sodium 140 136 - 145 mEq/L   Potassium 3.4 (L) 3.5 - 5.1 mEq/L   Chloride 108 98 - 109 mEq/L   CO2 22 22 - 29 mEq/L   Glucose 107 70 - 140 mg/dl   BUN 8.4 7.0 - 26.0 mg/dL   Creatinine 0.7 0.6 - 1.1 mg/dL   Total Bilirubin 0.37 0.20 - 1.20 mg/dL   Alkaline Phosphatase 84 40 - 150 U/L   AST 22 5 - 34 U/L   ALT 25 0 - 55 U/L   Total Protein 6.6 6.4 - 8.3 g/dL   Albumin 3.6 3.5 - 5.0 g/dL   Calcium 9.0 8.4 - 10.4 mg/dL   Anion Gap 10 3 - 11 mEq/L   EGFR 81 (L) >90 ml/min/1.73 m2    K resulted after visit slightly low, related to diarrhea. Will add potassium 20 mEq today then 10  mEq daily x 1 week and recheck with next chemistries  CA 125 resulted after visit 44, which may be related to ongoing gastroenteritis / GI tract inflammation  Studies/Results:  No results found.  Medications: I have reviewed the patient's current medications.  DISCUSSION Interval history discussed, including particularly the infectious gastroenteritis.   If the gastroenteritis symptoms have not resolved entirely by next week, would delay cycle 5 from 11-17-15 as presently scheduled - tho symptoms better today and hopefully will be able to continue as planned. She will see APP on 11-30-15 with CBC CMET CA 125 and will be due cycle 6 on schedule 12-08-15 as long as ANC >=1.5 and plt >=100k within 48 hours of treatment. She will have neulasta support as on pro injector with each treatment. I will see her back with labs on 12-24-15, then restaging CT 01-11-16 and to see Dr Josephina Shih on 01-15-16.   Assessment/Plan: 1.high grade serous carcinoma  of right ovary: unexpected finding at laparoscopic vaginal hysterectomy BSO 07-30-15, not completely staged but at least IIC. Carboplatin taxol begun 08-25-15, q 3 week dosing, total 6 cycles planned. Neutropenic by day 11 cycle 1, but has done well with neulasta home injector beginning cycle 2. Will have cycle 5 on 11-17-15 as long as ANC >=1.5 and plt >=100k, with on pro neulasta. Other scheduling as above. Genetics counseling upcoming. Will recheck CA 125 after viral GI illness well resolved. 2.viral gastroenteritis: also in family members, improving but not resolved. Likely causing elevated CA 125 today 3.osteopenia by bone density scan ~ 2 years ago 4.flu vaccine done 08-11-15 5.environmental allergies, uses prn Claritin 6.up to date mammograms, done 06-2015 at Dr [name] office 7.post unilateral salpingo oophorectomy (left) ~ 58 years ago 8.history of Cushings disease diagnosed 1979, surgery x2 last 57. Saw endocrinologist last Oct 2015 (Dr Elijah Birk [place]). 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  13.area on scalp present x years, no change size, looks bluish now which apparently is new (tho previously under hair). Does not look malignant, will follow and can ask Dr Denna Haggard to see if needed. 14.PAC in 15.hypokalemia related to recent diarrhea: supplement K+ and follow 16.oral candida likely related to steroids with chemo, vs stomatitis related to viral illness:  will prescribe either nystatin mouthwash or diflucan depending on insurance preference  All questions answered and we will let her know results of the CA 125 with likely explanation as noted. Chemo and neulasta orders in place. Time spent 25 min including >50% counseling and coordination of care. Cc Drs Jonni Sanger and Janine Limbo, MD   11/09/2015, 6:14 PM

## 2015-11-09 NOTE — Patient Instructions (Signed)

## 2015-11-10 ENCOUNTER — Other Ambulatory Visit: Payer: Self-pay | Admitting: Oncology

## 2015-11-10 ENCOUNTER — Telehealth: Payer: Self-pay

## 2015-11-10 DIAGNOSIS — E876 Hypokalemia: Secondary | ICD-10-CM

## 2015-11-10 DIAGNOSIS — C561 Malignant neoplasm of right ovary: Secondary | ICD-10-CM

## 2015-11-10 DIAGNOSIS — Z95828 Presence of other vascular implants and grafts: Secondary | ICD-10-CM | POA: Insufficient documentation

## 2015-11-10 LAB — CA 125: CA 125: 44 U/mL — AB (ref <35)

## 2015-11-10 MED ORDER — POTASSIUM CHLORIDE ER 10 MEQ PO TBCR: EXTENDED_RELEASE_TABLET | ORAL | Status: DC

## 2015-11-10 MED ORDER — FLUCONAZOLE 100 MG PO TABS
ORAL_TABLET | ORAL | Status: DC
Start: 2015-11-10 — End: 2015-12-24

## 2015-11-10 NOTE — Telephone Encounter (Signed)
Told Ms. Mcinerney the result of the potassium level as noted below by Dr. Marko Plume.  Pt. Verbalized understanding.

## 2015-11-10 NOTE — Telephone Encounter (Signed)
-----   Message from Gordy Levan, MD sent at 11/09/2015  5:56 PM EST ----- Labs seen and need follow up: please let her know K just a little low related to recent gastroenteritis. Have her take KCl 10 mEq 2 tabs first day then 1 daily #8.  See message re other meds also please

## 2015-11-13 ENCOUNTER — Telehealth: Payer: Self-pay | Admitting: Medical Oncology

## 2015-11-13 NOTE — Telephone Encounter (Signed)
-----   Message from Baruch Merl, RN sent at 11/13/2015  3:16 PM EST -----   ----- Message -----    From: Gordy Levan, MD    Sent: 11/10/2015   8:25 PM      To: Baruch Merl, RN  Labs seen and need follow up: please let her know that this elevation is very likely due to gastroenteritis, as CA 125 can be elevated with inflammation of GI tract. We will recheck the marker with labs ~ mid Jan.

## 2015-11-13 NOTE — Telephone Encounter (Signed)
Pt.notified

## 2015-11-17 ENCOUNTER — Other Ambulatory Visit (HOSPITAL_BASED_OUTPATIENT_CLINIC_OR_DEPARTMENT_OTHER): Payer: Medicare Other

## 2015-11-17 ENCOUNTER — Ambulatory Visit (HOSPITAL_BASED_OUTPATIENT_CLINIC_OR_DEPARTMENT_OTHER): Payer: Medicare Other

## 2015-11-17 VITALS — BP 151/66 | HR 79 | Temp 97.6°F | Resp 18

## 2015-11-17 DIAGNOSIS — C561 Malignant neoplasm of right ovary: Secondary | ICD-10-CM

## 2015-11-17 DIAGNOSIS — Z5111 Encounter for antineoplastic chemotherapy: Secondary | ICD-10-CM

## 2015-11-17 DIAGNOSIS — Z5189 Encounter for other specified aftercare: Secondary | ICD-10-CM

## 2015-11-17 LAB — COMPREHENSIVE METABOLIC PANEL
ALT: 19 U/L (ref 0–55)
ANION GAP: 10 meq/L (ref 3–11)
AST: 19 U/L (ref 5–34)
Albumin: 4 g/dL (ref 3.5–5.0)
Alkaline Phosphatase: 57 U/L (ref 40–150)
BUN: 16.5 mg/dL (ref 7.0–26.0)
CALCIUM: 10.2 mg/dL (ref 8.4–10.4)
CHLORIDE: 106 meq/L (ref 98–109)
CO2: 23 meq/L (ref 22–29)
CREATININE: 0.8 mg/dL (ref 0.6–1.1)
EGFR: 75 mL/min/{1.73_m2} — ABNORMAL LOW (ref >90)
Glucose: 131 mg/dl (ref 70–140)
POTASSIUM: 4.4 meq/L (ref 3.5–5.1)
Sodium: 139 mEq/L (ref 136–145)
Total Bilirubin: 0.33 mg/dL (ref 0.20–1.20)
Total Protein: 7.7 g/dL (ref 6.4–8.3)

## 2015-11-17 LAB — CBC WITH DIFFERENTIAL/PLATELET
BASO%: 0 % (ref 0.0–2.0)
BASOS ABS: 0 10*3/uL (ref 0.0–0.1)
EOS%: 0 % (ref 0.0–7.0)
Eosinophils Absolute: 0 10*3/uL (ref 0.0–0.5)
HCT: 39 % (ref 34.8–46.6)
HGB: 12.9 g/dL (ref 11.6–15.9)
LYMPH%: 9.1 % — AB (ref 14.0–49.7)
MCH: 31.5 pg (ref 25.1–34.0)
MCHC: 33.1 g/dL (ref 31.5–36.0)
MCV: 95.4 fL (ref 79.5–101.0)
MONO#: 0.1 10*3/uL (ref 0.1–0.9)
MONO%: 0.9 % (ref 0.0–14.0)
NEUT#: 4.8 10*3/uL (ref 1.5–6.5)
NEUT%: 90 % — AB (ref 38.4–76.8)
PLATELETS: 146 10*3/uL (ref 145–400)
RBC: 4.09 10*6/uL (ref 3.70–5.45)
RDW: 16.6 % — ABNORMAL HIGH (ref 11.2–14.5)
WBC: 5.4 10*3/uL (ref 3.9–10.3)
lymph#: 0.5 10*3/uL — ABNORMAL LOW (ref 0.9–3.3)

## 2015-11-17 MED ORDER — SODIUM CHLORIDE 0.9 % IV SOLN
Freq: Once | INTRAVENOUS | Status: AC
  Administered 2015-11-17: 09:00:00 via INTRAVENOUS
  Filled 2015-11-17: qty 8

## 2015-11-17 MED ORDER — PACLITAXEL CHEMO INJECTION 300 MG/50ML
175.0000 mg/m2 | Freq: Once | INTRAVENOUS | Status: AC
  Administered 2015-11-17: 312 mg via INTRAVENOUS
  Filled 2015-11-17: qty 52

## 2015-11-17 MED ORDER — DIPHENHYDRAMINE HCL 50 MG/ML IJ SOLN
INTRAMUSCULAR | Status: AC
  Filled 2015-11-17: qty 1

## 2015-11-17 MED ORDER — FAMOTIDINE IN NACL 20-0.9 MG/50ML-% IV SOLN
20.0000 mg | Freq: Once | INTRAVENOUS | Status: AC
  Administered 2015-11-17: 20 mg via INTRAVENOUS

## 2015-11-17 MED ORDER — HEPARIN SOD (PORK) LOCK FLUSH 100 UNIT/ML IV SOLN
500.0000 [IU] | Freq: Once | INTRAVENOUS | Status: AC | PRN
  Administered 2015-11-17: 500 [IU]
  Filled 2015-11-17: qty 5

## 2015-11-17 MED ORDER — SODIUM CHLORIDE 0.9 % IJ SOLN
10.0000 mL | INTRAMUSCULAR | Status: DC | PRN
  Administered 2015-11-17: 10 mL
  Filled 2015-11-17: qty 10

## 2015-11-17 MED ORDER — FAMOTIDINE IN NACL 20-0.9 MG/50ML-% IV SOLN
INTRAVENOUS | Status: AC
  Filled 2015-11-17: qty 50

## 2015-11-17 MED ORDER — SODIUM CHLORIDE 0.9 % IV SOLN
Freq: Once | INTRAVENOUS | Status: AC
  Administered 2015-11-17: 09:00:00 via INTRAVENOUS

## 2015-11-17 MED ORDER — SODIUM CHLORIDE 0.9 % IV SOLN
393.0000 mg | Freq: Once | INTRAVENOUS | Status: AC
  Administered 2015-11-17: 390 mg via INTRAVENOUS
  Filled 2015-11-17: qty 39

## 2015-11-17 MED ORDER — PEGFILGRASTIM 6 MG/0.6ML ~~LOC~~ PSKT
6.0000 mg | PREFILLED_SYRINGE | Freq: Once | SUBCUTANEOUS | Status: AC
  Administered 2015-11-17: 6 mg via SUBCUTANEOUS
  Filled 2015-11-17: qty 0.6

## 2015-11-17 MED ORDER — DIPHENHYDRAMINE HCL 50 MG/ML IJ SOLN
50.0000 mg | Freq: Once | INTRAMUSCULAR | Status: AC
  Administered 2015-11-17: 50 mg via INTRAVENOUS

## 2015-11-17 NOTE — Patient Instructions (Signed)
[facility] Discharge Instructions for Patients Receiving Chemotherapy  Today you received the following chemotherapy agents Taxol and Carboplatin. To help prevent nausea and vomiting after your treatment, we encourage you to take your nausea medication as directed.  If you develop nausea and vomiting that is not controlled by your nausea medication, call the clinic.   BELOW ARE SYMPTOMS THAT SHOULD BE REPORTED IMMEDIATELY:  *FEVER GREATER THAN 100.5 F  *CHILLS WITH OR WITHOUT FEVER  NAUSEA AND VOMITING THAT IS NOT CONTROLLED WITH YOUR NAUSEA MEDICATION  *UNUSUAL SHORTNESS OF BREATH  *UNUSUAL BRUISING OR BLEEDING  TENDERNESS IN MOUTH AND THROAT WITH OR WITHOUT PRESENCE OF ULCERS  *URINARY PROBLEMS  *BOWEL PROBLEMS  UNUSUAL RASH Items with * indicate a potential emergency and should be followed up as soon as possible.  Feel free to call the clinic you have any questions or concerns. The clinic phone number is [phone].  Please show the CHEMO ALERT CARD at check-in to the Emergency Department and triage nurse.    

## 2015-11-30 ENCOUNTER — Encounter: Payer: Self-pay | Admitting: Oncology

## 2015-11-30 ENCOUNTER — Other Ambulatory Visit (HOSPITAL_BASED_OUTPATIENT_CLINIC_OR_DEPARTMENT_OTHER): Payer: Medicare Other

## 2015-11-30 ENCOUNTER — Other Ambulatory Visit: Payer: Self-pay | Admitting: *Deleted

## 2015-11-30 ENCOUNTER — Ambulatory Visit (HOSPITAL_BASED_OUTPATIENT_CLINIC_OR_DEPARTMENT_OTHER): Payer: Medicare Other | Admitting: Oncology

## 2015-11-30 ENCOUNTER — Ambulatory Visit: Payer: Medicare Other

## 2015-11-30 VITALS — BP 146/55 | HR 83 | Temp 97.9°F | Resp 18 | Ht 62.75 in | Wt 150.6 lb

## 2015-11-30 DIAGNOSIS — M8580 Other specified disorders of bone density and structure, unspecified site: Secondary | ICD-10-CM

## 2015-11-30 DIAGNOSIS — C561 Malignant neoplasm of right ovary: Secondary | ICD-10-CM

## 2015-11-30 DIAGNOSIS — T451X5A Adverse effect of antineoplastic and immunosuppressive drugs, initial encounter: Secondary | ICD-10-CM

## 2015-11-30 DIAGNOSIS — D701 Agranulocytosis secondary to cancer chemotherapy: Secondary | ICD-10-CM

## 2015-11-30 DIAGNOSIS — Z95828 Presence of other vascular implants and grafts: Secondary | ICD-10-CM

## 2015-11-30 DIAGNOSIS — D6959 Other secondary thrombocytopenia: Secondary | ICD-10-CM

## 2015-11-30 DIAGNOSIS — E876 Hypokalemia: Secondary | ICD-10-CM

## 2015-11-30 LAB — CBC WITH DIFFERENTIAL/PLATELET
BASO%: 0.6 % (ref 0.0–2.0)
BASOS ABS: 0 10*3/uL (ref 0.0–0.1)
EOS ABS: 0 10*3/uL (ref 0.0–0.5)
EOS%: 0.5 % (ref 0.0–7.0)
HCT: 37.1 % (ref 34.8–46.6)
HEMOGLOBIN: 12.4 g/dL (ref 11.6–15.9)
LYMPH%: 17.9 % (ref 14.0–49.7)
MCH: 32 pg (ref 25.1–34.0)
MCHC: 33.6 g/dL (ref 31.5–36.0)
MCV: 95.3 fL (ref 79.5–101.0)
MONO#: 0.6 10*3/uL (ref 0.1–0.9)
MONO%: 10.8 % (ref 0.0–14.0)
NEUT#: 4 10*3/uL (ref 1.5–6.5)
NEUT%: 70.2 % (ref 38.4–76.8)
Platelets: 111 10*3/uL — ABNORMAL LOW (ref 145–400)
RBC: 3.89 10*6/uL (ref 3.70–5.45)
RDW: 17.2 % — AB (ref 11.2–14.5)
WBC: 5.7 10*3/uL (ref 3.9–10.3)
lymph#: 1 10*3/uL (ref 0.9–3.3)

## 2015-11-30 LAB — COMPREHENSIVE METABOLIC PANEL
ALBUMIN: 3.9 g/dL (ref 3.5–5.0)
ALK PHOS: 86 U/L (ref 40–150)
ALT: 14 U/L (ref 0–55)
ANION GAP: 8 meq/L (ref 3–11)
AST: 16 U/L (ref 5–34)
BILIRUBIN TOTAL: 0.32 mg/dL (ref 0.20–1.20)
BUN: 15.1 mg/dL (ref 7.0–26.0)
CALCIUM: 9.6 mg/dL (ref 8.4–10.4)
CO2: 24 meq/L (ref 22–29)
CREATININE: 0.7 mg/dL (ref 0.6–1.1)
Chloride: 108 mEq/L (ref 98–109)
EGFR: 81 mL/min/{1.73_m2} — AB (ref >90)
Glucose: 88 mg/dl (ref 70–140)
Potassium: 4.1 mEq/L (ref 3.5–5.1)
Sodium: 140 mEq/L (ref 136–145)
TOTAL PROTEIN: 7.1 g/dL (ref 6.4–8.3)

## 2015-11-30 MED ORDER — HEPARIN SOD (PORK) LOCK FLUSH 100 UNIT/ML IV SOLN
500.0000 [IU] | Freq: Once | INTRAVENOUS | Status: AC
  Administered 2015-11-30: 500 [IU] via INTRAVENOUS
  Filled 2015-11-30: qty 5

## 2015-11-30 MED ORDER — LORAZEPAM 0.5 MG PO TABS: ORAL_TABLET | ORAL | Status: DC

## 2015-11-30 MED ORDER — DEXAMETHASONE 4 MG PO TABS: ORAL_TABLET | ORAL | Status: DC

## 2015-11-30 MED ORDER — SODIUM CHLORIDE 0.9 % IJ SOLN
10.0000 mL | INTRAMUSCULAR | Status: DC | PRN
  Administered 2015-11-30: 10 mL via INTRAVENOUS
  Filled 2015-11-30: qty 10

## 2015-11-30 NOTE — Progress Notes (Signed)
OFFICE PROGRESS NOTE   November 30, 2015   [place], [place], ([place], [place], [place] [phone]/ [phone]), Emily Hull, gynecology [place], [place]). 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 She had cycle 5 carboplatin taxol on 11-17-15 with neulasta by on pro injector.  Has not had any nausea, vomiting, or diarrhea. Bowels are moving well. She denies peripheral neuropathy symptoms or problems with PAC, no bleeding and no other symptoms that seem related to chemo. Remainder of 10 point Review of Systems negative/ unchanged.   Flu vaccine 08-11-15 PAC placed by IR 09-11-15  genetics counseling scheduled for 12-15-15. CA 125 55 on 07-07-15  ONCOLOGIC HISTORY Patient has no prior history of gyn concerns, last gyn exam with PAP Feb 2015 Emily Hull). She had light spotting ~ Jul 01, 2015, seen at Urgent Care with pelvic US done in [place] system 07-02-15 reportedly with uterus 5.5 x 3.1 x 2.9 cm with endometrial stripe 6-91m, a 1.2 cm hypoechoic posterior myometrial mass, right ovary 3.1 x 2.1 x 2.1 cm with 1.9 cm cyst and slightly prominent vascularity, and left ovary not clearly identified with 5.4 x 4.0 x 3.9 cm cystin left adnexa. She was referred to Dr RDory Horn with endometrial biopsy benign findings. CA 125 was 55 on 07-07-15. CT AP at [facility]8-[facility]-16 with 5.2 cm cystic lesion in left adnexa with probable thin internal septation, uterus unremarkable, no ascites, also sigmoid diverticulosis and cholelithiasis. She had laparoscopically assisted vaginal hysterectomy, BSO, resection of bilateral cystic adnexal masses and peritoneal washings 07-30-15. Intraoperatively there was no abnormality in upper abdomen, no  peritoneal lesions, large cystic smooth mass in left adnexa and in right adnexa a hemorrhagic appearing cystic structure adherent to right pelvic sidewall, no ascites. Surgical Pathology ([place] [phone] [phone] with high grade serous carcinoma in right ovary biopsy/ wedge resection and mucinous cystadenoma with benign tube on left, benign cervix and uterus. [facility] ([facility] [phone] malignant cells consistent with high grade carcinoma. SHe was seen in consultation by Dr CJosephina Shihon 08-07-15, who recommended proceeding with chemotherapy in preference to completion surgical staging with the delay that would require prior to systemic treatment. Recommendation was 6 cycles of carboplatin taxol with q 3 week dosing, then if CA 125 is normal by completion to repeat CT then.  Dr CJosephina Shihplans to see her again while chemo is in progress. She has had no postoperative complications. First chemo with carboplatin taxol given 08-25-15, with ANC 0.6 on day 11.   Objective:  Vital signs in last 24 hours:  BP 146/55 mmHg  Pulse 83  Temp(Src) 97.9 F (36.6 C) (Oral)  Resp 18  Ht 5' 2.75" (1.594 m)  Wt 150 lb 9.6 oz (68.312 kg)  BMI 26.89 kg/m2  SpO2 100% Weight up 1 lbs. Alert, oriented and appropriate. Ambulatory without difficulty. Respirations not labored. Does not appear acutely ill. Alopecia  HEENT:PERRL, sclerae not icteric. Oral mucosa moist, erythematous and slightly irritated, with possible thrush on palate, posterior pharynx clear.  Neck supple. No JVD.  Lymphatics:no cervical,supraclavicular or inguinal adenopathy Resp: clear to auscultation bilaterally and normal percussion bilaterally Cardio: regular rate and rhythm. No gallop. GI: soft, nontender, not distended, no mass or organomegaly. Normally active bowel sounds Musculoskeletal/ Extremities: without pitting edema, cords, tenderness Neuro: no  significant peripheral neuropathy. Otherwise nonfocal.  Psych appropriate mood and affect Skin without rash, ecchymosis, petechiae Portacath-without erythema or tenderness  Lab Results:  Results for orders placed or performed in visit on 11/30/15  CBC with Differential  Result Value Ref Range   WBC 5.7 3.9 - 10.3 10e3/uL   NEUT# 4.0 1.5 - 6.5 10e3/uL   HGB 12.4 11.6 - 15.9 g/dL   HCT 37.1 34.8 - 46.6 %   Platelets 111 (L) 145 - 400 10e3/uL   MCV 95.3 79.5 - 101.0 fL   MCH 32.0 25.1 - 34.0 pg   MCHC 33.6 31.5 - 36.0 g/dL   RBC 3.89 3.70 - 5.45 10e6/uL   RDW 17.2 (H) 11.2 - 14.5 %   lymph# 1.0 0.9 - 3.3 10e3/uL   MONO# 0.6 0.1 - 0.9 10e3/uL   Eosinophils Absolute 0.0 0.0 - 0.5 10e3/uL   Basophils Absolute 0.0 0.0 - 0.1 10e3/uL   NEUT% 70.2 38.4 - 76.8 %   LYMPH% 17.9 14.0 - 49.7 %   MONO% 10.8 0.0 - 14.0 %   EOS% 0.5 0.0 - 7.0 %   BASO% 0.6 0.0 - 2.0 %  Comprehensive metabolic panel  Result Value Ref Range   Sodium 140 136 - 145 mEq/L   Potassium 4.1 3.5 - 5.1 mEq/L   Chloride 108 98 - 109 mEq/L   CO2 24 22 - 29 mEq/L   Glucose 88 70 - 140 mg/dl   BUN 15.1 7.0 - 26.0 mg/dL   Creatinine 0.7 0.6 - 1.1 mg/dL   Total Bilirubin 0.32 0.20 - 1.20 mg/dL   Alkaline Phosphatase 86 40 - 150 U/L   AST 16 5 - 34 U/L   ALT 14 0 - 55 U/L   Total Protein 7.1 6.4 - 8.3 g/dL   Albumin 3.9 3.5 - 5.0 g/dL   Calcium 9.6 8.4 - 10.4 mg/dL   Anion Gap 8 3 - 11 mEq/L   EGFR 81 (L) >90 ml/min/1.73 m2    CA 125 was 44 on 11/09/2015.  Studies/Results:  No results found.  Medications: I have reviewed the patient's current medications. Refilled Dexamethasone pre-med and Lorazepam for nausea.   DISCUSSION Patient is doing well today. Counts have been reviewed and are normal except for a slightly low platelet count. She will return on 12-08-15 and proceed with cycle 6 of her chemo as long as [place] >=1.5 and plt >=100k. She will have neulasta support as on pro injector with each treatment. CA 125 will be checked again on 12/08/15. Return visit with labs  on 12-24-15, then restaging CT 01-11-16 and to see Dr Josephina Shih on 01-15-16.   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 Carboplatin taxol begun 08-25-15, q 3 week dosing, total 6 cycles planned. Neutropenic by day 11 cycle 1, but has done well with neulasta home injector beginning cycle 2. Will have cycle 6 on 1-17/2017 as long as [place] >=1.5 and plt >=100k, with on pro neulasta. Other scheduling as above. Genetics counseling upcoming. Will recheck CA 125 on day of cycle 6. 2.viral gastroenteritis: also in family members, improving but not resolved. Likely causing elevated CA 125 today, Symptoms are resolved. 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 Saw endocrinologist last Oct 2015 (Dr Elijah Birk Grayson). 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  13.area on scalp present x years, no change size, looks bluish now which apparently is new (tho previously under hair). Does not look malignant, will follow and can ask Dr Denna Haggard to see if needed. 14.PAC in 15.hypokalemia related to recent diarrhea: supplement K+ and follow. K+ is normal today. No additional K+ supplement ordered.  16.oral candida likely related to steroids with chemo, vs stomatitis related to viral illness. Resolved today.  All questions answered. Chemo and neulasta orders in place.  Time spent 20 min including >50% counseling and coordination of care.  Cc Drs Jonni Sanger  and Kathlynn Grate, Erasmo Downer, NP   11/30/2015, 1:53 PM

## 2015-11-30 NOTE — Patient Instructions (Signed)

## 2015-12-04 ENCOUNTER — Other Ambulatory Visit: Payer: Self-pay | Admitting: Oncology

## 2015-12-08 ENCOUNTER — Other Ambulatory Visit (HOSPITAL_BASED_OUTPATIENT_CLINIC_OR_DEPARTMENT_OTHER): Payer: Medicare Other

## 2015-12-08 ENCOUNTER — Ambulatory Visit (HOSPITAL_BASED_OUTPATIENT_CLINIC_OR_DEPARTMENT_OTHER): Payer: Medicare Other

## 2015-12-08 ENCOUNTER — Ambulatory Visit: Payer: Medicare Other

## 2015-12-08 VITALS — BP 149/65 | HR 74 | Temp 96.9°F | Resp 18

## 2015-12-08 DIAGNOSIS — C561 Malignant neoplasm of right ovary: Secondary | ICD-10-CM

## 2015-12-08 DIAGNOSIS — Z5111 Encounter for antineoplastic chemotherapy: Secondary | ICD-10-CM | POA: Diagnosis not present

## 2015-12-08 DIAGNOSIS — Z95828 Presence of other vascular implants and grafts: Secondary | ICD-10-CM

## 2015-12-08 LAB — CBC WITH DIFFERENTIAL/PLATELET
BASO%: 0.6 % (ref 0.0–2.0)
Basophils Absolute: 0 10*3/uL (ref 0.0–0.1)
EOS%: 0 % (ref 0.0–7.0)
Eosinophils Absolute: 0 10*3/uL (ref 0.0–0.5)
HCT: 40.5 % (ref 34.8–46.6)
HGB: 13.4 g/dL (ref 11.6–15.9)
LYMPH%: 10 % — AB (ref 14.0–49.7)
MCH: 31.9 pg (ref 25.1–34.0)
MCHC: 33.1 g/dL (ref 31.5–36.0)
MCV: 96.5 fL (ref 79.5–101.0)
MONO#: 0 10*3/uL — ABNORMAL LOW (ref 0.1–0.9)
MONO%: 0.7 % (ref 0.0–14.0)
NEUT%: 88.7 % — ABNORMAL HIGH (ref 38.4–76.8)
NEUTROS ABS: 3.2 10*3/uL (ref 1.5–6.5)
Platelets: 128 10*3/uL — ABNORMAL LOW (ref 145–400)
RBC: 4.2 10*6/uL (ref 3.70–5.45)
RDW: 16.3 % — AB (ref 11.2–14.5)
WBC: 3.6 10*3/uL — AB (ref 3.9–10.3)
lymph#: 0.4 10*3/uL — ABNORMAL LOW (ref 0.9–3.3)

## 2015-12-08 LAB — COMPREHENSIVE METABOLIC PANEL
ALT: 14 U/L (ref 0–55)
ANION GAP: 11 meq/L (ref 3–11)
AST: 15 U/L (ref 5–34)
Albumin: 4.2 g/dL (ref 3.5–5.0)
Alkaline Phosphatase: 63 U/L (ref 40–150)
BILIRUBIN TOTAL: 0.39 mg/dL (ref 0.20–1.20)
BUN: 14.4 mg/dL (ref 7.0–26.0)
CHLORIDE: 109 meq/L (ref 98–109)
CO2: 20 meq/L — AB (ref 22–29)
CREATININE: 0.8 mg/dL (ref 0.6–1.1)
Calcium: 10.2 mg/dL (ref 8.4–10.4)
EGFR: 75 mL/min/{1.73_m2} — ABNORMAL LOW (ref >90)
Glucose: 192 mg/dl — ABNORMAL HIGH (ref 70–140)
Potassium: 4.2 mEq/L (ref 3.5–5.1)
Sodium: 140 mEq/L (ref 136–145)
TOTAL PROTEIN: 7.5 g/dL (ref 6.4–8.3)

## 2015-12-08 MED ORDER — PACLITAXEL CHEMO INJECTION 300 MG/50ML
175.0000 mg/m2 | Freq: Once | INTRAVENOUS | Status: AC
  Administered 2015-12-08: 312 mg via INTRAVENOUS
  Filled 2015-12-08: qty 52

## 2015-12-08 MED ORDER — DIPHENHYDRAMINE HCL 50 MG/ML IJ SOLN
INTRAMUSCULAR | Status: AC
  Filled 2015-12-08: qty 1

## 2015-12-08 MED ORDER — SODIUM CHLORIDE 0.9 % IJ SOLN
10.0000 mL | INTRAMUSCULAR | Status: DC | PRN
  Filled 2015-12-08: qty 10

## 2015-12-08 MED ORDER — SODIUM CHLORIDE 0.9 % IV SOLN
Freq: Once | INTRAVENOUS | Status: AC
  Administered 2015-12-08: 10:00:00 via INTRAVENOUS

## 2015-12-08 MED ORDER — SODIUM CHLORIDE 0.9 % IJ SOLN
10.0000 mL | INTRAMUSCULAR | Status: DC | PRN
  Administered 2015-12-08: 10 mL
  Filled 2015-12-08: qty 10

## 2015-12-08 MED ORDER — FAMOTIDINE IN NACL 20-0.9 MG/50ML-% IV SOLN
INTRAVENOUS | Status: AC
  Filled 2015-12-08: qty 50

## 2015-12-08 MED ORDER — SODIUM CHLORIDE 0.9 % IV SOLN
393.0000 mg | Freq: Once | INTRAVENOUS | Status: AC
  Administered 2015-12-08: 390 mg via INTRAVENOUS
  Filled 2015-12-08: qty 39

## 2015-12-08 MED ORDER — DIPHENHYDRAMINE HCL 50 MG/ML IJ SOLN
50.0000 mg | Freq: Once | INTRAMUSCULAR | Status: AC
  Administered 2015-12-08: 50 mg via INTRAVENOUS

## 2015-12-08 MED ORDER — SODIUM CHLORIDE 0.9 % IV SOLN
Freq: Once | INTRAVENOUS | Status: AC
  Administered 2015-12-08: 10:00:00 via INTRAVENOUS
  Filled 2015-12-08: qty 8

## 2015-12-08 MED ORDER — FAMOTIDINE IN NACL 20-0.9 MG/50ML-% IV SOLN
20.0000 mg | Freq: Once | INTRAVENOUS | Status: AC
  Administered 2015-12-08: 20 mg via INTRAVENOUS

## 2015-12-08 MED ORDER — HEPARIN SOD (PORK) LOCK FLUSH 100 UNIT/ML IV SOLN
500.0000 [IU] | Freq: Once | INTRAVENOUS | Status: AC | PRN
  Administered 2015-12-08: 500 [IU]
  Filled 2015-12-08: qty 5

## 2015-12-08 MED ORDER — PEGFILGRASTIM 6 MG/0.6ML ~~LOC~~ PSKT
6.0000 mg | PREFILLED_SYRINGE | Freq: Once | SUBCUTANEOUS | Status: AC
  Administered 2015-12-08: 6 mg via SUBCUTANEOUS
  Filled 2015-12-08: qty 0.6

## 2015-12-08 NOTE — Progress Notes (Signed)
Labs drawn peripherally today per pt request

## 2015-12-08 NOTE — Patient Instructions (Signed)
[facility] Discharge Instructions for Patients Receiving Chemotherapy  Today you received the following chemotherapy agents Taxol and Carboplatin.  To help prevent nausea and vomiting after your treatment, we encourage you to take your nausea medication Zofran 8mg every 8 hours as needed.  If you develop nausea and vomiting that is not controlled by your nausea medication, call the clinic.   BELOW ARE SYMPTOMS THAT SHOULD BE REPORTED IMMEDIATELY:  *FEVER GREATER THAN 100.5 F  *CHILLS WITH OR WITHOUT FEVER  NAUSEA AND VOMITING THAT IS NOT CONTROLLED WITH YOUR NAUSEA MEDICATION  *UNUSUAL SHORTNESS OF BREATH  *UNUSUAL BRUISING OR BLEEDING  TENDERNESS IN MOUTH AND THROAT WITH OR WITHOUT PRESENCE OF ULCERS  *URINARY PROBLEMS  *BOWEL PROBLEMS  UNUSUAL RASH Items with * indicate a potential emergency and should be followed up as soon as possible.  Feel free to call the clinic you have any questions or concerns. The clinic phone number is [phone].  Please show the CHEMO ALERT CARD at check-in to the Emergency Department and triage nurse.   

## 2015-12-09 LAB — CANCER ANTIGEN 125 (PARALLEL TESTING): CA 125: 29 U/mL (ref <35)

## 2015-12-09 LAB — CA 125: CANCER ANTIGEN (CA) 125: 30 U/mL (ref 0.0–38.1)

## 2015-12-11 ENCOUNTER — Ambulatory Visit: Payer: Medicare Other | Admitting: Gynecology

## 2015-12-15 ENCOUNTER — Ambulatory Visit (HOSPITAL_BASED_OUTPATIENT_CLINIC_OR_DEPARTMENT_OTHER): Payer: Medicare Other | Admitting: Genetic Counselor

## 2015-12-15 ENCOUNTER — Other Ambulatory Visit (HOSPITAL_BASED_OUTPATIENT_CLINIC_OR_DEPARTMENT_OTHER): Payer: Medicare Other

## 2015-12-15 ENCOUNTER — Encounter: Payer: Self-pay | Admitting: Genetic Counselor

## 2015-12-15 DIAGNOSIS — Z809 Family history of malignant neoplasm, unspecified: Secondary | ICD-10-CM

## 2015-12-15 DIAGNOSIS — C561 Malignant neoplasm of right ovary: Secondary | ICD-10-CM

## 2015-12-15 DIAGNOSIS — Z315 Encounter for genetic counseling: Secondary | ICD-10-CM | POA: Diagnosis not present

## 2015-12-15 DIAGNOSIS — Z800 Family history of malignant neoplasm of digestive organs: Secondary | ICD-10-CM

## 2015-12-15 LAB — CBC WITH DIFFERENTIAL/PLATELET
BASO%: 0.8 % (ref 0.0–2.0)
Basophils Absolute: 0.1 10*3/uL (ref 0.0–0.1)
EOS ABS: 0 10*3/uL (ref 0.0–0.5)
EOS%: 0.5 % (ref 0.0–7.0)
HCT: 39.3 % (ref 34.8–46.6)
HGB: 12.9 g/dL (ref 11.6–15.9)
LYMPH%: 15.2 % (ref 14.0–49.7)
MCH: 32.3 pg (ref 25.1–34.0)
MCHC: 32.9 g/dL (ref 31.5–36.0)
MCV: 98.1 fL (ref 79.5–101.0)
MONO#: 0.9 10*3/uL (ref 0.1–0.9)
MONO%: 13.5 % (ref 0.0–14.0)
NEUT#: 4.5 10*3/uL (ref 1.5–6.5)
NEUT%: 70 % (ref 38.4–76.8)
PLATELETS: 86 10*3/uL — AB (ref 145–400)
RBC: 4 10*6/uL (ref 3.70–5.45)
RDW: 15.5 % — ABNORMAL HIGH (ref 11.2–14.5)
WBC: 6.4 10*3/uL (ref 3.9–10.3)
lymph#: 1 10*3/uL (ref 0.9–3.3)

## 2015-12-15 LAB — COMPREHENSIVE METABOLIC PANEL
ALT: 15 U/L (ref 0–55)
ANION GAP: 10 meq/L (ref 3–11)
AST: 19 U/L (ref 5–34)
Albumin: 4.3 g/dL (ref 3.5–5.0)
Alkaline Phosphatase: 91 U/L (ref 40–150)
BILIRUBIN TOTAL: 0.37 mg/dL (ref 0.20–1.20)
BUN: 16.3 mg/dL (ref 7.0–26.0)
CHLORIDE: 105 meq/L (ref 98–109)
CO2: 25 meq/L (ref 22–29)
Calcium: 9.8 mg/dL (ref 8.4–10.4)
Creatinine: 0.8 mg/dL (ref 0.6–1.1)
EGFR: 73 mL/min/{1.73_m2} — AB (ref >90)
Glucose: 91 mg/dl (ref 70–140)
POTASSIUM: 3.7 meq/L (ref 3.5–5.1)
SODIUM: 140 meq/L (ref 136–145)
Total Protein: 7.8 g/dL (ref 6.4–8.3)

## 2015-12-15 NOTE — Progress Notes (Signed)
REFERRING PROVIDER: Evlyn Clines, MD  PRIMARY PROVIDER:  Leamon Arnt, MD  PRIMARY REASON FOR VISIT:  1. Right ovarian epithelial cancer ([place])   2. Family history of liver cancer      HISTORY OF PRESENT ILLNESS:   Emily Hull, a 75 y.o. female, was seen for a [place] cancer genetics consultation at the request of Dr. Marko Plume due to a personal history of ovarian cancer and family history of cancer.  Emily Hull presents to clinic today with her husband to discuss the possibility of a hereditary predisposition to cancer, genetic testing, and to further clarify her future cancer risks, as well as potential cancer risks for family members.   In September 2016, at the age of 49, Emily Hull was diagnosed with epithelial serous carcinoma of the right ovary. This was treated with surgery and chemotherapy.    CANCER HISTORY:   No history exists.     HORMONAL RISK FACTORS:  Menarche was somewhat later than her friends, but before the age of 20.  First live birth at age 19.  OCP use for approximately 0 years.  Ovaries intact: no.  Hysterectomy: yes.  Menopausal status: postmenopausal.  HRT use: combination for approximately 5 years. Colonoscopy: yes; is due for one this year; last one in 2006 found one small polyp, but was otherwise normal; no additional polyp history to patient's knowledge. Mammogram within the last year: yes. Number of breast biopsies: 1. Up to date with pelvic exams:  yes Any excessive radiation exposure in the past:  no  Past Medical History  Diagnosis Date  . Ovarian cancer ([place])   . Cushing's disease ([place])   . Osteopenia   . Elevated cholesterol     Past Surgical History  Procedure Laterality Date  . Vaginal hysterectomy      Social History   Social History  . Marital Status: Married    Spouse Name: N/A  . Number of Children: N/A  . Years of Education: N/A   Social History Main Topics  . Smoking status: Former Smoker -- 0.25  packs/day for 2 years    Types: Cigarettes    Quit date: 11/21/1972  . Smokeless tobacco: Never Used  . Alcohol Use: No  . Drug Use: No  . Sexual Activity: Not Asked   Other Topics Concern  . None   Social History Narrative     FAMILY HISTORY:  We obtained a detailed, 4-generation family history.  Significant diagnoses are listed below: Family History  Problem Relation Age of Onset  . Parkinson's disease Mother 71    w/ dementia  . Heart attack Father     hardening of arteries  . Other Sister 79    hysterectomy due to heavy bleeding; still has ovaries  . Cancer Maternal Grandmother     maybe liver cancer; dx. at least in 40s-50s  . Heart attack Paternal Grandfather 36    Emily Hull has one adopted son who is currently 64, and one biological son who is currently 22.  Her younger son has two sons of his own.  Emily Hull has one full sister who is currently 41.  This sister has no history of cancer, but has a history of a hysterectomy at the age of 43 due to heavy bleeding.  Emily Hull mother died at the age of 68.  She had no history of cancer; she was diagnosed with parkinson's and dementia around the age of 7.  Emily Hull father died of a heart attack  and hardening of the arteries at the age 79.    Emily Hull mother had two full sisters and six full brothers.  Some of these aunts and uncles are still living, and at least one aunt is in her 31s.  Emily Hull has limited contact with these family members who live in [place], but she is unaware of any cancer in these aunts/uncles or any of their children.  Emily Hull maternal grandmother died at an unspecified age, but has a history of cancer, believed to have been liver cancer, first diagnosed in her 77s-50s.  Emily Hull maternal grandfather died at an "older age", but she has limited information for him.  Emily Hull has no further information for any maternal great aunts/uncles or great grandparents.  Emily Hull.  Hull father had one full brother and three full sisters, all of whom are in their 45s and have not had cancer.  Emily Hull has limited information for any of her paternal first cousins, but she is unaware of any cancer diagnoses for them.  Emily Hull paternal grandmother died in her 65s and her paternal grandfather died of a heart attack at 9.  She has no further information for any paternal great aunts/uncles or great grandparents.    Patient's maternal ancestors are of English descent, and paternal ancestors are of [place]ic/Canadian descent. There is no reported Ashkenazi Jewish ancestry. There is no known consanguinity.  GENETIC COUNSELING ASSESSMENT: Lianna Sitzmann is a 75 y.o. female with a personal history of ovarian cancer which is somewhat suggestive of a hereditary ovarian cancer syndrome and predisposition to cancer. We, therefore, discussed and recommended the following at today's visit.   DISCUSSION: We reviewed the characteristics, features and inheritance patterns of hereditary cancer syndromes, particularly those caused by mutations within the BRCA1/2 and Lynch syndrome genes. We also discussed genetic testing, including the appropriate family members to test, the process of testing, insurance coverage and turn-around-time for results. We discussed the implications of a negative, positive and/or variant of uncertain significant result. We recommended Emily Hull pursue genetic testing for the 20-gene Breast/Ovarian Cancer Panel through [facility].  The Breast/Ovarian Cancer Panel offered by [facility] Hope Pigeon, MD) includes sequencing and deletion/duplication analysis for the following 19 genes:  ATM, BARD1, BRCA1, BRCA2, BRIP1, CDH1, CHEK2, FANCC, MLH1, MSH2, MSH6, NBN, PALB2, PMS2, PTEN, RAD51C, RAD51D, TP53, and XRCC2.  This panel also includes deletion/duplication analysis (without sequencing) for one gene, EPCAM.  Based on Emily Hull's personal  history of cancer, she meets medical criteria for genetic testing. Despite that she meets criteria, she may still have an out of pocket cost. We discussed that if her out of pocket cost for testing is over $100, the laboratory will call and confirm whether she wants to proceed with testing.  If the out of pocket cost of testing is less than $100 she will be billed by the genetic testing laboratory.   PLAN: After considering the risks, benefits, and limitations, Emily Hull  provided informed consent to pursue genetic testing and the blood sample was sent to [facility] for analysis of the 20-gene Breast/Ovarian Cancer Panel test. Results should be available within approximately 2-3 weeks' time, at which point they will be disclosed by telephone to Emily Hull, as will any additional recommendations warranted by these results. Emily Hull. [place] will receive a summary of her genetic counseling visit and a copy of her results once available. This information will also be available in Epic. We encouraged Emily Hull. Falter to remain in contact  with cancer genetics annually so that we can continuously update the family history and inform her of any changes in cancer genetics and testing that may be of benefit for her family. Emily Hull. Mraz questions were answered to her satisfaction today. Our contact information was provided should additional questions or concerns arise.  Thank you for the referral and allowing [name] to share in the care of your patient.   Jeanine Luz, Emily Hull Genetic Counselor [email or web address] phone: [phone]  The patient was seen for a total of 70 minutes in face-to-face genetic counseling.  This patient was discussed with Drs. Magrinat, Lindi Adie and/or Burr Medico who agrees with the above.    _______________________________________________________________________ For Office Staff:  Number of people involved in session: 2 Was an Intern/ student involved with case: no

## 2015-12-16 ENCOUNTER — Telehealth: Payer: Self-pay

## 2015-12-16 NOTE — Telephone Encounter (Signed)
Told Emily Hull the results of the labs as noted below by  Dr. Marko Plume. Patient only using Tylenol for pain if needed. Patient experiences very slight bleeding with flossing.

## 2015-12-16 NOTE — Telephone Encounter (Signed)
-----   Message from Gordy Levan, MD sent at 12/15/2015  3:01 PM EST ----- Labs seen and need follow up: platelets a little low still from last chemo, hgb and WBC good. Call if bleeding, no NSAID or ASA for now

## 2015-12-23 ENCOUNTER — Other Ambulatory Visit: Payer: Self-pay | Admitting: Oncology

## 2015-12-24 ENCOUNTER — Other Ambulatory Visit (HOSPITAL_BASED_OUTPATIENT_CLINIC_OR_DEPARTMENT_OTHER): Payer: Medicare Other

## 2015-12-24 ENCOUNTER — Ambulatory Visit (HOSPITAL_BASED_OUTPATIENT_CLINIC_OR_DEPARTMENT_OTHER): Payer: Medicare Other | Admitting: Oncology

## 2015-12-24 ENCOUNTER — Ambulatory Visit: Payer: Medicare Other

## 2015-12-24 ENCOUNTER — Telehealth: Payer: Self-pay | Admitting: Oncology

## 2015-12-24 ENCOUNTER — Telehealth: Payer: Self-pay

## 2015-12-24 ENCOUNTER — Encounter: Payer: Self-pay | Admitting: Oncology

## 2015-12-24 VITALS — BP 147/74 | HR 73 | Temp 97.7°F | Resp 18 | Ht 62.75 in | Wt 152.5 lb

## 2015-12-24 DIAGNOSIS — M8580 Other specified disorders of bone density and structure, unspecified site: Secondary | ICD-10-CM

## 2015-12-24 DIAGNOSIS — Z95828 Presence of other vascular implants and grafts: Secondary | ICD-10-CM

## 2015-12-24 DIAGNOSIS — E7800 Pure hypercholesterolemia, unspecified: Secondary | ICD-10-CM | POA: Diagnosis not present

## 2015-12-24 DIAGNOSIS — C561 Malignant neoplasm of right ovary: Secondary | ICD-10-CM

## 2015-12-24 LAB — COMPREHENSIVE METABOLIC PANEL
ALT: 14 U/L (ref 0–55)
AST: 17 U/L (ref 5–34)
Albumin: 3.8 g/dL (ref 3.5–5.0)
Alkaline Phosphatase: 75 U/L (ref 40–150)
Anion Gap: 9 mEq/L (ref 3–11)
BILIRUBIN TOTAL: 0.31 mg/dL (ref 0.20–1.20)
BUN: 13.9 mg/dL (ref 7.0–26.0)
CO2: 24 meq/L (ref 22–29)
Calcium: 9.3 mg/dL (ref 8.4–10.4)
Chloride: 107 mEq/L (ref 98–109)
Creatinine: 0.7 mg/dL (ref 0.6–1.1)
EGFR: 82 mL/min/{1.73_m2} — AB (ref >90)
GLUCOSE: 90 mg/dL (ref 70–140)
POTASSIUM: 4 meq/L (ref 3.5–5.1)
SODIUM: 139 meq/L (ref 136–145)
TOTAL PROTEIN: 6.9 g/dL (ref 6.4–8.3)

## 2015-12-24 LAB — CBC WITH DIFFERENTIAL/PLATELET
BASO%: 0.4 % (ref 0.0–2.0)
Basophils Absolute: 0 10*3/uL (ref 0.0–0.1)
EOS ABS: 0 10*3/uL (ref 0.0–0.5)
EOS%: 0.4 % (ref 0.0–7.0)
HCT: 36.7 % (ref 34.8–46.6)
HEMOGLOBIN: 12.2 g/dL (ref 11.6–15.9)
LYMPH%: 15.7 % (ref 14.0–49.7)
MCH: 32.3 pg (ref 25.1–34.0)
MCHC: 33.2 g/dL (ref 31.5–36.0)
MCV: 97.5 fL (ref 79.5–101.0)
MONO#: 0.7 10*3/uL (ref 0.1–0.9)
MONO%: 9.5 % (ref 0.0–14.0)
NEUT%: 74 % (ref 38.4–76.8)
NEUTROS ABS: 5.3 10*3/uL (ref 1.5–6.5)
Platelets: 115 10*3/uL — ABNORMAL LOW (ref 145–400)
RBC: 3.77 10*6/uL (ref 3.70–5.45)
RDW: 15.7 % — AB (ref 11.2–14.5)
WBC: 7.1 10*3/uL (ref 3.9–10.3)
lymph#: 1.1 10*3/uL (ref 0.9–3.3)

## 2015-12-24 MED ORDER — SODIUM CHLORIDE 0.9% FLUSH
10.0000 mL | INTRAVENOUS | Status: DC | PRN
  Administered 2015-12-24: 10 mL via INTRAVENOUS
  Filled 2015-12-24: qty 10

## 2015-12-24 MED ORDER — ATORVASTATIN CALCIUM 10 MG PO TABS: 10.0000 mg | ORAL_TABLET | Freq: Every day | ORAL | Status: DC

## 2015-12-24 MED ORDER — HEPARIN SOD (PORK) LOCK FLUSH 100 UNIT/ML IV SOLN
500.0000 [IU] | Freq: Once | INTRAVENOUS | Status: AC
  Administered 2015-12-24: 500 [IU] via INTRAVENOUS
  Filled 2015-12-24: qty 5

## 2015-12-24 NOTE — Telephone Encounter (Signed)
Appointments made and avs printed for patient °

## 2015-12-24 NOTE — Patient Instructions (Signed)

## 2015-12-24 NOTE — Progress Notes (Signed)
OFFICE PROGRESS NOTE   December 24, 2015   [place], [place], ([place], [place], [place] [phone]/ [phone]), Maudie Flakes, gynecology [place], [place]). 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  She will have CTs 01-11-16 and see Dr Josephina Shih on 01-15-16. PAC is in place, will be flushed with CT on 01-11-16.  Patient has tolerated chemotherapy well overall, including most recent treatment. She has no significant peripheral neuropathy, no abdominal or pelvic discomfort, and energy adequate for regular activities tho not at baseline. She is eating and bowels are moving regularly. No bleeding, no fever or symptoms of infection. No LE swelling. No SOB or other respiratory symptoms. No problems with PAC, which she is very willing to keep for now. Remainder of 10 point Review of Systems negative.   Flu vaccine 08-11-15 PAC placed by IR 09-11-15 genetics counseling testing sent 12-14-14 and pending.  CA 125 55 on 07-07-15, down to 29 on day of cycle 6 chemo.   ONCOLOGIC HISTORY Patient has no prior history of gyn concerns, last gyn exam with PAP Feb 2015 [facility] [place]). She had light spotting ~ Jul 01, 2015, seen at Urgent Care with pelvic US done in [place] system 07-02-15 reportedly with uterus 5.5 x 3.1 x 2.9 cm with endometrial stripe 6-96m, a 1.2 cm hypoechoic posterior myometrial mass, right ovary 3.1 x 2.1 x 2.1 cm with 1.9 cm cyst and slightly prominent vascularity, and left ovary not clearly identified with 5.4 x 4.0 x 3.9 cm cystin left adnexa. She was referred to Dr RDory Horn with endometrial biopsy benign findings. CA 125 was 55 on 07-07-15. [place] [facility] with 5.2 cm cystic lesion  in left adnexa with probable thin internal septation, uterus unremarkable, no ascites, also sigmoid diverticulosis and cholelithiasis. She had laparoscopically assisted vaginal hysterectomy, BSO, resection of bilateral cystic adnexal masses and peritoneal washings 07-30-15. Intraoperatively there was no abnormality in upper abdomen, no peritoneal lesions, large cystic smooth mass in left adnexa and in right adnexa a hemorrhagic appearing cystic structure adherent to right pelvic sidewall, no ascites. Surgical Pathology ([place] [phone] [phone] with high grade serous carcinoma in right ovary biopsy/ wedge resection and mucinous cystadenoma with benign tube on left, benign cervix and uterus. Per[facility] washings ([phone] malignant cells consistent with high grade carcinoma. SHe was seen in consultation by Dr CJosephina Shihon 08-07-15, who recommended proceeding with chemotherapy in preference to completion surgical staging with the delay that would require prior to systemic treatment. Recommendation was 6 cycles of carboplatin taxol with q 3 week dosing, then if CA 125 is normal by completion to repeat CT then.  Dr CJosephina Shihplans to see her again while chemo is in progress. She has had no postoperative complications. First chemo with carboplatin taxol given 08-25-15, with ANC 0.6 on day 11.    Objective:  Vital signs in last 24 hours:  BP 147/74 mmHg  Pulse 73  Temp(Src) 97.7 F (36.5 C) (Oral)  Resp 18  Ht 5' 2.75" (1.594 m)  Wt 152 lb 8 oz (69.174 kg)  BMI 27.22 kg/m2  SpO2 100%  Alert, oriented and appropriate. Ambulatory without difficulty. Looks comfortable, respirations not labored.  Alopecia  HEENT:PERRL, sclerae not icteric. Oral mucosa moist without  lesions, posterior pharynx clear.  Neck supple. No JVD.  Lymphatics:no cervical,supraclavicular, axillary or inguinal adenopathy Resp: clear to auscultation bilaterally and normal percussion  bilaterally Cardio: regular rate and rhythm. No gallop. GI: soft, nontender, not distended, no mass or organomegaly. Normally active bowel sounds. Surgical incision not remarkable. Musculoskeletal/ Extremities: without pitting edema, cords, tenderness Neuro: no peripheral neuropathy. Otherwise nonfocal. PSYCH appropriate mood and affect Skin without rash, ecchymosis, petechiae Breasts: without dominant mass, skin or nipple findings. Axillae benign. Portacath-without erythema or tenderness  Lab Results:  Results for orders placed or performed in visit on 12/24/15  CBC with Differential  Result Value Ref Range   WBC 7.1 3.9 - 10.3 10e3/uL   NEUT# 5.3 1.5 - 6.5 10e3/uL   HGB 12.2 11.6 - 15.9 g/dL   HCT 36.7 34.8 - 46.6 %   Platelets 115 (L) 145 - 400 10e3/uL   MCV 97.5 79.5 - 101.0 fL   MCH 32.3 25.1 - 34.0 pg   MCHC 33.2 31.5 - 36.0 g/dL   RBC 3.77 3.70 - 5.45 10e6/uL   RDW 15.7 (H) 11.2 - 14.5 %   lymph# 1.1 0.9 - 3.3 10e3/uL   MONO# 0.7 0.1 - 0.9 10e3/uL   Eosinophils Absolute 0.0 0.0 - 0.5 10e3/uL   Basophils Absolute 0.0 0.0 - 0.1 10e3/uL   NEUT% 74.0 38.4 - 76.8 %   LYMPH% 15.7 14.0 - 49.7 %   MONO% 9.5 0.0 - 14.0 %   EOS% 0.4 0.0 - 7.0 %   BASO% 0.4 0.0 - 2.0 %  Comprehensive metabolic panel  Result Value Ref Range   Sodium 139 136 - 145 mEq/L   Potassium 4.0 3.5 - 5.1 mEq/L   Chloride 107 98 - 109 mEq/L   CO2 24 22 - 29 mEq/L   Glucose 90 70 - 140 mg/dl   BUN 13.9 7.0 - 26.0 mg/dL   Creatinine 0.7 0.6 - 1.1 mg/dL   Total Bilirubin 0.31 0.20 - 1.20 mg/dL   Alkaline Phosphatase 75 40 - 150 U/L   AST 17 5 - 34 U/L   ALT 14 0 - 55 U/L   Total Protein 6.9 6.4 - 8.3 g/dL   Albumin 3.8 3.5 - 5.0 g/dL   Calcium 9.3 8.4 - 10.4 mg/dL   Anion Gap 9 3 - 11 mEq/L   EGFR 82 (L) >90 ml/min/1.73 m2    CA 125   26 on 12-08-15, this having been 29 in Nov, then 44 in Dec with gastroenteritis symptoms.  Studies/Results: CT AP scheduled 02-10-16   Medications: I have reviewed  the patient's current medications. At patient's request, will refill lipitor just until she gets back to Dr Jonni Sanger ,whom she had met just prior to cancer diagnosis.  DISCUSSION Patient has brought a list of questions, which we have reviewed. Discussed reevaluation as planned upcoming. If NED, she may be eligible for GOG 0225 diet and exercise study, which I mentioned briefly now.  Discussed gradual improvement in energy generally over several months after completion of treatment. Discussed keeping PAC flushed every 6-8 weeks when not otherwise used, which can be done at this office, coordinating with other visits as possible.    NOTE may be eligible for GOG 0225 if NED by upcoming scans and gyn oncology exam.  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 Carboplatin taxol begun 08-25-15, q 3 week dosing, total 6 cycles completed 12-08-15.[name]  Neutropenic by day 11 cycle 1, but has done well with neulasta home injector beginning cycle 2. Genetics testing in process. CA 125 back into normal range after Dec gastroenteritis resolved. 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 Saw endocrinologist last Oct 2015 (Dr Elijah Birk [place]). 9.minimal remote tobacco 10.elevated cholesterol on lipitor. Filled x1 from this office now, then will be by PCP 11.chemo neutropenia and chemo thrombocytopenia resolved   All questions answered. Time spent 25 min including >50% counseling and coordination of care. CC Dr Jonni Sanger and Dr Janine Limbo, MD   12/24/2015,  3:27 PM

## 2015-12-24 NOTE — Telephone Encounter (Signed)
-----   Message from Gordy Levan, MD sent at 12/24/2015  2:22 PM EST ----- OK to refill lipitor #30 + 2 RF which will get her to apt with new PCP. PCP should fill after that  thanks

## 2015-12-26 ENCOUNTER — Encounter: Payer: Self-pay | Admitting: Oncology

## 2015-12-26 NOTE — Progress Notes (Signed)
[facility] END OF TREATMENT   Name: Emily Hull Date: December 26, 2015  MRN: [id number] DOB: 1941/02/19   TREATMENT DATES: 08-25-15 thru 12-08-15   REFERRING PHYSICIAN: D.ClarkePearson  DIAGNOSIS: high grade serous ovarian carcinoma  STAGE AT START OF TREATMENT: at least IIC (incompletely staged)  INTENT: curative   DRUGS OR REGIMENS GIVEN: taxol carboplatin   MAJOR TOXICITIES: neutropenia   REASON TREATMENT STOPPED: completion of planned course  PERFORMANCE STATUS AT END: 1   ONGOING PROBLEMS: mild fatigue   FOLLOW UP PLANS: CT AP and follow up with gyn onclogy

## 2016-01-08 ENCOUNTER — Telehealth: Payer: Self-pay | Admitting: Genetic Counselor

## 2016-01-08 NOTE — Telephone Encounter (Signed)
Discussed with Ms. Emily Hull that her genetic test result was negative for pathogenic mutations within any of 20 genes that would cause her to be at an increased genetic risk for ovarian, breast, or other related cancers.  Additionally, no uncertain changes were found.  Discussed that this is likely a  reassuring result for [name], especially since there are few known cancers in the family and several family members have lived to later ages in life and have not had cancer.  Ms. Emily Hull asked if her son still needed to have testing and we discussed that he did not unless there was a concerning history of cancer on his father's side of the family.  She also asked whether her sister should have testing.  We discussed that while there is always a chance that there is a genetic mutation in the family that Ms. Emily Hull just did not inherit herself, that it is very unlikely that her sister would need to worry about this or to have genetic testing herself.  Ms. Emily Hull sister has not had cancer and there is no other known family history of breast or ovarian cancer, so it is unlikely that there is a genetic mutation causing hereditary cancer in the family.  Ms. Emily Hull also wondered if her sister should have her ovaries removed.  We discussed that, yes, her sister may be at a somewhat increased risk for ovarian cancer, since Ms. Emily Hull was diagnosed, however, with no known pathogenic genetic mutation and no other history of ovarian cancer in the family, and also considering her age, it is unlikely that this will be recommended for her.  However, her doctor would be the best person to discuss this option with.  Ms. Emily Hull would find a copy of her test results and a results letter helpful and she would like to share it with her sister, so I will send this to her in a secure email.  She knows she is always welcome to contact me with any additional questions.  I also encouraged her to call and update the family history with  [name], if anyone else is diagnosed with cancer in the family.

## 2016-01-11 ENCOUNTER — Encounter (HOSPITAL_COMMUNITY): Payer: Self-pay

## 2016-01-11 ENCOUNTER — Ambulatory Visit (HOSPITAL_COMMUNITY)
Admission: RE | Admit: 2016-01-11 | Discharge: 2016-01-11 | Disposition: A | Discharge status: Home / Self Care | Payer: Medicare Other | Source: Ambulatory Visit | Attending: Oncology | Admitting: Oncology

## 2016-01-11 ENCOUNTER — Encounter: Payer: Self-pay | Admitting: Genetic Counselor

## 2016-01-11 ENCOUNTER — Ambulatory Visit: Payer: Self-pay | Admitting: Genetic Counselor

## 2016-01-11 DIAGNOSIS — I2510 Atherosclerotic heart disease of native coronary artery without angina pectoris: Secondary | ICD-10-CM | POA: Insufficient documentation

## 2016-01-11 DIAGNOSIS — K8020 Calculus of gallbladder without cholecystitis without obstruction: Secondary | ICD-10-CM | POA: Diagnosis not present

## 2016-01-11 DIAGNOSIS — Z1379 Encounter for other screening for genetic and chromosomal anomalies: Secondary | ICD-10-CM

## 2016-01-11 DIAGNOSIS — M47816 Spondylosis without myelopathy or radiculopathy, lumbar region: Secondary | ICD-10-CM | POA: Diagnosis not present

## 2016-01-11 DIAGNOSIS — K449 Diaphragmatic hernia without obstruction or gangrene: Secondary | ICD-10-CM | POA: Diagnosis not present

## 2016-01-11 DIAGNOSIS — M5136 Other intervertebral disc degeneration, lumbar region: Secondary | ICD-10-CM | POA: Diagnosis not present

## 2016-01-11 DIAGNOSIS — C561 Malignant neoplasm of right ovary: Secondary | ICD-10-CM | POA: Diagnosis not present

## 2016-01-11 MED ORDER — IOHEXOL 300 MG/ML  SOLN
100.0000 mL | Freq: Once | INTRAMUSCULAR | Status: AC | PRN
  Administered 2016-01-11: 100 mL via INTRAVENOUS

## 2016-01-14 DIAGNOSIS — Z1379 Encounter for other screening for genetic and chromosomal anomalies: Secondary | ICD-10-CM | POA: Insufficient documentation

## 2016-01-14 NOTE — Progress Notes (Signed)
GENETIC TEST RESULT  HPI: Ms. Gelinas was previously seen in the [place] clinic due to a personal history of right epithelial serous ovarian cancer and concerns regarding a hereditary predisposition to cancer. Please refer to our prior cancer genetics clinic note from December 15, 2015 for more information regarding Ms. Mizzell's medical, social and family histories, and our assessment and recommendations, at the time. Ms. Sirianni recent genetic test results were disclosed to her, as were recommendations warranted by these results. These results and recommendations are discussed in more detail below.  GENETIC TEST RESULTS: At the time of Ms. Brager's visit on 12/15/15, we recommended she pursue genetic testing of the 20-gene Breast/Ovarian Cancer Panel through [facility].  The Breast/Ovarian Cancer Panel offered by [facility] Hope Pigeon, MD) includes sequencing and deletion/duplication analysis for the following 19 genes:  ATM, BARD1, BRCA1, BRCA2, BRIP1, CDH1, CHEK2, FANCC, MLH1, MSH2, MSH6, NBN, PALB2, PMS2, PTEN, RAD51C, RAD51D, TP53, and XRCC2.  This panel also includes deletion/duplication analysis (without sequencing) for one gene, EPCAM.  Those results are now back, the report date for which is January 08, 2016.  Genetic testing was normal, and did not reveal a deleterious mutation in these genes.  Additionally, no variants of uncertain significance (VUSes) were found.  The test report will be scanned into EPIC and will be located under the Results Review tab in the Pathology>Molecular Pathology section.   We discussed with Ms. Birkey that since the current genetic testing is not perfect, it is possible there may be a gene mutation in one of these genes that current testing cannot detect, but that chance is small. We also discussed, that it is possible that another gene that has not yet been discovered, or that we have not yet tested, is responsible  for the cancer diagnoses in the family, and it is, therefore, important to remain in touch with cancer genetics in the future so that we can continue to offer Ms. Asper the most up to date genetic testing.   CANCER SCREENING RECOMMENDATIONS: This result is reassuring and indicates that Ms. Arabie likely does not have an increased risk for a future cancer due to a mutation in one of these genes. This normal test also suggests that Ms. Mejorado's cancer was most likely not due to an inherited predisposition associated with one of these genes.  Most cancers happen by chance and this negative test suggests that her cancer falls into this category.  Additionally, though Ms. Weis has limited information for some relatives, several relatives have lived to later ages in life and have never had cancer.  We, therefore, recommended she continue to follow the cancer management and screening guidelines provided by her oncology and primary healthcare providers.   RECOMMENDATIONS FOR FAMILY MEMBERS: Women in this family might be at some increased risk of developing cancer, over the general population risk, simply due to the family history of cancer. We recommended women in this family have a yearly mammogram beginning at age 74, or 64 years younger than the earliest onset of cancer, an an annual clinical breast exam, and perform monthly breast self-exams. Women in this family should also have a gynecological exam as recommended by their primary provider. All family members should have a colonoscopy by age 53.  Women in the immediate family, such as Ms. Deleo's sister, are at a somewhat increased risk for ovarian cancer due to Ms. Hearld's history.  Ms. Hoggard sister should make her primary doctor aware of this history and  she should follow her doctor's recommendations for future cancer screening.  FOLLOW-UP: Lastly, we discussed with Ms. Haugen that cancer genetics is a rapidly advancing field and it is  possible that new genetic tests will be appropriate for her and/or her family members in the future. We encouraged her to remain in contact with cancer genetics on an annual basis so we can update her personal and family histories and let her know of advances in cancer genetics that may benefit this family.   Our contact number was provided. Ms. Silveira questions were answered to her satisfaction, and she knows she is welcome to call us at anytime with additional questions or concerns.   Jeanine Luz, MS Genetic Counselor [email or web address] Phone: [phone]

## 2016-01-15 ENCOUNTER — Ambulatory Visit: Payer: Medicare Other | Attending: Gynecology | Admitting: Gynecology

## 2016-01-15 ENCOUNTER — Encounter: Payer: Self-pay | Admitting: Gynecology

## 2016-01-15 VITALS — BP 152/73 | HR 74 | Temp 98.1°F | Ht 62.75 in | Wt 153.4 lb

## 2016-01-15 DIAGNOSIS — E7800 Pure hypercholesterolemia, unspecified: Secondary | ICD-10-CM | POA: Insufficient documentation

## 2016-01-15 DIAGNOSIS — Z87891 Personal history of nicotine dependence: Secondary | ICD-10-CM | POA: Diagnosis not present

## 2016-01-15 DIAGNOSIS — K8020 Calculus of gallbladder without cholecystitis without obstruction: Secondary | ICD-10-CM | POA: Insufficient documentation

## 2016-01-15 DIAGNOSIS — Z8544 Personal history of malignant neoplasm of other female genital organs: Secondary | ICD-10-CM

## 2016-01-15 DIAGNOSIS — N840 Polyp of corpus uteri: Secondary | ICD-10-CM | POA: Insufficient documentation

## 2016-01-15 DIAGNOSIS — E249 Cushing's syndrome, unspecified: Secondary | ICD-10-CM | POA: Diagnosis not present

## 2016-01-15 DIAGNOSIS — C561 Malignant neoplasm of right ovary: Secondary | ICD-10-CM

## 2016-01-15 DIAGNOSIS — K5730 Diverticulosis of large intestine without perforation or abscess without bleeding: Secondary | ICD-10-CM | POA: Insufficient documentation

## 2016-01-15 DIAGNOSIS — K449 Diaphragmatic hernia without obstruction or gangrene: Secondary | ICD-10-CM | POA: Insufficient documentation

## 2016-01-15 DIAGNOSIS — C7982 Secondary malignant neoplasm of genital organs: Secondary | ICD-10-CM | POA: Diagnosis not present

## 2016-01-15 DIAGNOSIS — M8580 Other specified disorders of bone density and structure, unspecified site: Secondary | ICD-10-CM | POA: Insufficient documentation

## 2016-01-15 NOTE — Patient Instructions (Signed)
Plan to have a CT scan with a CA 125 in May followed by an appointment to meet with Dr. Fermin Schwab on May 19 or sooner if needed.  Please call for any questions or concerns.

## 2016-01-15 NOTE — Progress Notes (Signed)
Consult Note: Gyn-Onc   Emily Hull 75 y.o. female  No chief complaint on file.   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 Clinically free of disease.  Plan:  We'll plan on repeating a CT scan to reassess the spleen in approximately 3 months. We will obtain a CA-125 at that time and I will see the patient back in follow-up to review these reports. In the meantime patient given the okay to return to full levels of activity. She is given information regarding signs and symptoms recurrence of ovarian cancer and she should report them should she develop any problems.   Interval history: Patient returns today now having completed 6 cycles of carboplatin and Taxol. She tolerated the therapy well and has had no evidence of neuropathy. She had a repeat CT scan following completion of chemotherapy that looked normal except forseveral adjacent peripheral abnormal hypodensities along the anterior superior spleen margin,differential diagnostic considerations including small peripheralinterval splenic infarcts, splenic injury with subcapsular a fluid collections, or less likely metastatic disease to the splenicmargin.   Most recent CA-125 was 29 units per mL down from 96 units per mL preoperatively. The patient reports she feels well. She denies any GI GU or pelvic symptoms. Functional status is good.   HPI: 75 year old white married female seen in consultation request of Dr. Dory Horn regarding management of a newly diagnosed high-grade papillary serous carcinoma the ovary. The patient was initially seen for evaluation of vaginal discharge and postmenopausal bleeding. An ultrasound was obtained on 07/02/2015 which showed heterogeneous endometrial echogenicity with a 1.2 cm hypoechoic posterior myometrial mass and endometrial stripe measuring 6-7 mm. In addition the right ovary had a 1.9 cm cystic mass  with slightly prominent vascularity. The left ovary appeared to measure 5. 4 x 4 by 3.9 cm and was cystic with grossly normal ovarian flow. There is a tiny amount of free peritoneal fluid in the cul-de-sac. CA-125was 55 units per mL. Patient underwent a CT scan of the abdomen and pelvis on August 22 showing a 5.2 cm left adnexal mass, colonic diverticulosis gallstones small hiatal hernia. There are no pathologic enlarged lymph nodes and no evidence of peritoneal disease. Patient underwent laparoscopically-assisted total vaginal hysterectomy, bilateral salpingo-oophorectoy.[name] She was found to have a high-grade serous carcinoma arising from the right ovary involving the serosa of the ovary and adjacent fallopian tube. Peritoneal washings were positive. 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 The patient's had an uncomplicated postoperative course.  ( review of the pathology report says that the right ovary contained the malignancy however I wonder if these were mislabeled. Given the 2 imaging studies and Dr. [name] operative note indicating that the malignancy was in the left ovary)  The patient subsequently received 6 cycles of carboplatin and Taxol chemotherapy completed 12/08/2015. Review of Systems:10 point review of systems is negative except as noted in interval history.   Vitals: There were no vitals taken for this visit.  Physical Exam: General : The patient is a healthy woman in no acute distress.  HEENT: normocephalic, extraoccular movements normal; neck is supple without thyromegally  Lynphnodes: Supraclavicular and inguinal nodes not enlarged  Abdomen: Soft, non-tender, no ascites, no organomegally, no masses, no hernias , incisions are healing well Pelvic: EGBUS normal.  Vagina is normal vaginal cuff is well-healed.  Cervix and uterus surgically absent  Bimanual and rectovaginal exam  reveal no masses induration or  nodularity.  Lower extremities without edema or varicosities.     No Known Allergies  Past Medical History  Diagnosis Date  . Ovarian cancer ([place])   . Cushing's disease ([place])   . Osteopenia   . Elevated cholesterol     Past Surgical History  Procedure Laterality Date  . Vaginal hysterectomy      Current Outpatient Prescriptions  Medication Sig Dispense Refill  . atorvastatin (LIPITOR) 10 MG tablet Take 1 tablet (10 mg total) by mouth daily. 30 tablet 2  . Calcium Citrate-Vitamin D (CALCIUM + D PO) Take 1 tablet by mouth daily.    . Coenzyme Q10 (CO Q 10) 100 MG CAPS Take 1 capsule by mouth daily.     . Glucosamine-Chondroit-Vit C-Mn (GLUCOSAMINE 1500 COMPLEX PO) Take 1 capsule by mouth 2 (two) times daily.    [name] loratadine (CLARITIN) 10 MG tablet Take 10 mg by mouth daily.     . Multiple Vitamin (MULTIVITAMIN) capsule Take 1 capsule by mouth daily.     . Omega-3 Fatty Acids (OMEGA-3 FISH OIL) 300 MG CAPS Take 1 capsule by mouth daily.     . vitamin E 400 UNIT capsule Take 400 Units by mouth daily.      No current facility-administered medications for this visit.    Social History   Social History  . Marital Status: Married    Spouse Name: N/A  . Number of Children: N/A  . Years of Education: N/A   Occupational History  . Not on file.   Social History Main Topics  . Smoking status: Former Smoker -- 0.25 packs/day for 2 years    Types: Cigarettes    Quit date: 11/21/1972  . Smokeless tobacco: Never Used  . Alcohol Use: No  . Drug Use: No  . Sexual Activity: Not on file   Other Topics Concern  . Not on file   Social History Narrative    Family History  Problem Relation Age of Onset  . Parkinson's disease Mother 45    w/ dementia  . Heart attack Father     hardening of arteries  . Other Sister 56    hysterectomy due to heavy bleeding; still has ovaries  . Cancer Maternal Grandmother     maybe liver cancer; dx. at least in 40s-50s  . Heart attack  Paternal Grandfather Todd, MD 01/15/2016, 1:00 PM

## 2016-03-02 ENCOUNTER — Other Ambulatory Visit: Payer: Self-pay | Admitting: Oncology

## 2016-03-02 DIAGNOSIS — C561 Malignant neoplasm of right ovary: Secondary | ICD-10-CM

## 2016-03-07 ENCOUNTER — Ambulatory Visit (HOSPITAL_BASED_OUTPATIENT_CLINIC_OR_DEPARTMENT_OTHER): Payer: Medicare Other | Admitting: Oncology

## 2016-03-07 ENCOUNTER — Other Ambulatory Visit (HOSPITAL_BASED_OUTPATIENT_CLINIC_OR_DEPARTMENT_OTHER): Payer: Medicare Other

## 2016-03-07 ENCOUNTER — Telehealth: Payer: Self-pay | Admitting: Oncology

## 2016-03-07 ENCOUNTER — Encounter: Payer: Self-pay | Admitting: Oncology

## 2016-03-07 ENCOUNTER — Ambulatory Visit: Payer: Medicare Other

## 2016-03-07 VITALS — BP 133/72 | HR 66 | Temp 97.9°F | Resp 18 | Ht 62.75 in | Wt 151.9 lb

## 2016-03-07 DIAGNOSIS — D701 Agranulocytosis secondary to cancer chemotherapy: Secondary | ICD-10-CM | POA: Diagnosis not present

## 2016-03-07 DIAGNOSIS — D6959 Other secondary thrombocytopenia: Secondary | ICD-10-CM | POA: Diagnosis not present

## 2016-03-07 DIAGNOSIS — D696 Thrombocytopenia, unspecified: Secondary | ICD-10-CM

## 2016-03-07 DIAGNOSIS — C561 Malignant neoplasm of right ovary: Secondary | ICD-10-CM

## 2016-03-07 DIAGNOSIS — Z95828 Presence of other vascular implants and grafts: Secondary | ICD-10-CM

## 2016-03-07 DIAGNOSIS — M8580 Other specified disorders of bone density and structure, unspecified site: Secondary | ICD-10-CM

## 2016-03-07 LAB — CBC WITH DIFFERENTIAL/PLATELET
BASO%: 1.2 % (ref 0.0–2.0)
BASOS ABS: 0 10*3/uL (ref 0.0–0.1)
EOS ABS: 0.2 10*3/uL (ref 0.0–0.5)
EOS%: 4.5 % (ref 0.0–7.0)
HEMATOCRIT: 38.3 % (ref 34.8–46.6)
HEMOGLOBIN: 13 g/dL (ref 11.6–15.9)
LYMPH%: 29.5 % (ref 14.0–49.7)
MCH: 31.6 pg (ref 25.1–34.0)
MCHC: 33.9 g/dL (ref 31.5–36.0)
MCV: 93 fL (ref 79.5–101.0)
MONO#: 0.5 10*3/uL (ref 0.1–0.9)
MONO%: 13.6 % (ref 0.0–14.0)
NEUT%: 51.2 % (ref 38.4–76.8)
NEUTROS ABS: 1.7 10*3/uL (ref 1.5–6.5)
PLATELETS: 125 10*3/uL — AB (ref 145–400)
RBC: 4.12 10*6/uL (ref 3.70–5.45)
RDW: 12.6 % (ref 11.2–14.5)
WBC: 3.3 10*3/uL — AB (ref 3.9–10.3)
lymph#: 1 10*3/uL (ref 0.9–3.3)

## 2016-03-07 LAB — COMPREHENSIVE METABOLIC PANEL
ALBUMIN: 3.8 g/dL (ref 3.5–5.0)
ALK PHOS: 45 U/L (ref 40–150)
ALT: 13 U/L (ref 0–55)
ANION GAP: 9 meq/L (ref 3–11)
AST: 20 U/L (ref 5–34)
BILIRUBIN TOTAL: 0.39 mg/dL (ref 0.20–1.20)
BUN: 13.2 mg/dL (ref 7.0–26.0)
CALCIUM: 9.7 mg/dL (ref 8.4–10.4)
CO2: 24 mEq/L (ref 22–29)
Chloride: 108 mEq/L (ref 98–109)
Creatinine: 0.8 mg/dL (ref 0.6–1.1)
EGFR: 76 mL/min/{1.73_m2} — AB (ref >90)
GLUCOSE: 90 mg/dL (ref 70–140)
Potassium: 4.1 mEq/L (ref 3.5–5.1)
Sodium: 141 mEq/L (ref 136–145)
TOTAL PROTEIN: 7 g/dL (ref 6.4–8.3)

## 2016-03-07 MED ORDER — SODIUM CHLORIDE 0.9% FLUSH
10.0000 mL | INTRAVENOUS | Status: DC | PRN
  Administered 2016-03-07: 10 mL via INTRAVENOUS
  Filled 2016-03-07: qty 10

## 2016-03-07 MED ORDER — HEPARIN SOD (PORK) LOCK FLUSH 100 UNIT/ML IV SOLN
500.0000 [IU] | Freq: Once | INTRAVENOUS | Status: AC
  Administered 2016-03-07: 500 [IU] via INTRAVENOUS
  Filled 2016-03-07: qty 5

## 2016-03-07 NOTE — Progress Notes (Signed)
OFFICE PROGRESS NOTE   March 08, 2016   Physicians: Ventura Sellers Leanord Hawking, [place], [place] [phone]/ [phone]), Maudie Flakes, gynecology [place], [place]). 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 She completed 6 cycles of adjuvant carboplatin taxol on 12-08-15. CT AP 01-11-16 had an area questioned at anterior superior aspect of spleen, with follow up CT planned 04-06-16 prior to seeing Dr Josephina Shih next on 04-08-16.  She will see Dr Nori Riis on 03-22-16.  Patient has felt gradually stronger and more energetic since completing chemotherapy, now back walking 1-2 miles 3-4x weekly. Appetite is good, bowels moving regularly, no abdominal or pelvic discomfort, no bleeding or unusual bruising, no LE swelling, bladder ok, no SOB. She notices some environmental allergies but has had no fever or symptoms of infection. No problems with PAC. No significant residual peripheral neuropathy. Some vision changes being followed by ophth. Remainder of 10 point Review of Systems negative.   Flu vaccine 08-11-15 PAC flushed 03-07-16 and will be used for upcoming CT Genetics testing normal by [facility] Breast Ovarian panel 12-14-14.  CA 125 55 on 07-07-15, down to 29 on day of cycle 6 chemo.  ONCOLOGIC HISTORY Patient has no prior history of gyn concerns, last gyn exam with PAP Feb 2015 [facility] [place]). She had light spotting ~ Jul 01, 2015, seen at Urgent Care with pelvic US done in [place] system 07-02-15 reportedly with uterus 5.5 x 3.1 x 2.9 cm with endometrial stripe 6-83m, a 1.2 cm hypoechoic posterior myometrial mass, right ovary 3.1 x 2.1 x 2.1 cm with 1.9 cm cyst and slightly prominent vascularity, and left ovary not clearly identified with 5.4 x 4.0 x 3.9 cm cystin left adnexa. She was referred to Dr RDory Horn  with endometrial biopsy benign findings. CA 125 was 55 on 07-07-15. [place] with 5.2 cm cystic lesion in left adnexa with probable thin internal septation, uterus unremarkable, no ascites, also sigmoid diverticulosis and cholelithiasis. She had laparoscopically assisted vaginal hysterectomy, BSO, resection of bilateral cystic adnexal masses and peritoneal washings 07-30-15. Intraoperatively there was no abnormality in upper abdomen, no peritoneal lesions, large cystic smooth mass in left adnexa and in right adnexa a hemorrhagic appearing cystic structure adherent to right pelvic sidewall, no ascites. Surgical Pathology ([place] [phone] [phone] with high grade serous carcinoma in right ovary biopsy/ wedge resection and mucinous cystadenoma with benign tube on left, benign cervix and uterus. Peritoneal washings ([facility] [phone] malignant cells consistent with high grade carcinoma. SHe was seen in consultation by Dr CJosephina Shihon 08-07-15, who recommended proceeding with chemotherapy in preference to completion surgical staging with the delay that would require prior to systemic treatment. Recommendation was 6 cycles of carboplatin taxol with q 3 week dosing. First chemo with carboplatin taxol given 08-25-15, with ANC 0.6 on day 11. Counts subsequently maintained with gCSF (neulasta by on pro)  thru cycle 6 on 12-08-15. CT AP 01-11-16 had vague area questioned at anterior superior aspect of spleen, otherwise no findings of concern.   Objective:  Vital signs in last 24 hours:  BP 133/72 mmHg  Pulse 66  Temp(Src) 97.9 F (36.6 C) (Oral)  Resp 18  Ht 5' 2.75" (1.594 m)  Wt 151 lb 14.4 oz (68.901 kg)  BMI 27.12 kg/m2  SpO2 100% Weight stable Alert, oriented and  appropriate. Ambulatory without difficulty. Looks comfortable, very pleasant as always. Hair starting to grow back  HEENT:PERRL, sclerae not icteric. Oral mucosa moist without lesions, posterior pharynx  clear.  Neck supple. No JVD.  Lymphatics:no cervical,supraclavicular, axillary or inguinal adenopathy Resp: clear to auscultation bilaterally and normal percussion bilaterally Cardio: regular rate and rhythm. No gallop. GI: soft, nontender, not distended, no mass or organomegaly. Normally active bowel sounds. Surgical incision not remarkable. Musculoskeletal/ Extremities: without pitting edema, cords, tenderness Neuro: no peripheral neuropathy. Otherwise nonfocal. PSYCH appropriate mood and affect Skin without rash, ecchymosis, petechiae Portacath-without erythema or tenderness  Lab Results:  Results for orders placed or performed in visit on 03/07/16  CBC with Differential  Result Value Ref Range   WBC 3.3 (L) 3.9 - 10.3 10e3/uL   NEUT# 1.7 1.5 - 6.5 10e3/uL   HGB 13.0 11.6 - 15.9 g/dL   HCT 38.3 34.8 - 46.6 %   Platelets 125 (L) 145 - 400 10e3/uL   MCV 93.0 79.5 - 101.0 fL   MCH 31.6 25.1 - 34.0 pg   MCHC 33.9 31.5 - 36.0 g/dL   RBC 4.12 3.70 - 5.45 10e6/uL   RDW 12.6 11.2 - 14.5 %   lymph# 1.0 0.9 - 3.3 10e3/uL   MONO# 0.5 0.1 - 0.9 10e3/uL   Eosinophils Absolute 0.2 0.0 - 0.5 10e3/uL   Basophils Absolute 0.0 0.0 - 0.1 10e3/uL   NEUT% 51.2 38.4 - 76.8 %   LYMPH% 29.5 14.0 - 49.7 %   MONO% 13.6 0.0 - 14.0 %   EOS% 4.5 0.0 - 7.0 %   BASO% 1.2 0.0 - 2.0 %  Comprehensive metabolic panel  Result Value Ref Range   Sodium 141 136 - 145 mEq/L   Potassium 4.1 3.5 - 5.1 mEq/L   Chloride 108 98 - 109 mEq/L   CO2 24 22 - 29 mEq/L   Glucose 90 70 - 140 mg/dl   BUN 13.2 7.0 - 26.0 mg/dL   Creatinine 0.8 0.6 - 1.1 mg/dL   Total Bilirubin 0.39 0.20 - 1.20 mg/dL   Alkaline Phosphatase 45 40 - 150 U/L   AST 20 5 - 34 U/L   ALT 13 0 - 55 U/L   Total Protein 7.0 6.4 - 8.3 g/dL   Albumin 3.8 3.5 - 5.0 g/dL   Calcium 9.7 8.4 - 10.4 mg/dL   Anion Gap 9 3 - 11 mEq/L   EGFR 76 (L) >90 ml/min/1.73 m2  CA 125  Result Value Ref Range   Cancer Antigen (CA) 125 24.2 0.0 - 38.1 U/mL  CA  125 (Parallel Testing)  Result Value Ref Range   CA 125 24 <35 U/mL   CA 125 resulted after visit and will be communicated to patient Last prior CA 125 on 12-08-15 was 30 by new lab method and 29 by previous "parallel testing method"  Platelets were 172k at medical oncology new patient consultation 07-2015  Studies/Results: CT ABDOMEN AND PELVIS WITH CONTRAST 01-11-16  COMPARISON: 07/13/2015  FINDINGS: Lower chest: Small type 1 hiatal hernia.  Hepatobiliary: Large indistinct gallstones throughout the gallbladder, measuring up to about 2 cm in diameter. There is potential mild gallbladder wall thickening or adenomyosis accounting for slight gallbladder wall irregularity. No biliary dilatation.  Pancreas: The pancreas has an unusual orientation with a angulation in the pancreatic tail shown on image 26 series 2, but appears otherwise normal.  Spleen: Indistinct marginal hypodensity along the anterior superior spleen, images 78 through 87 of series 603, not present  previously.  Adrenals/Urinary Tract: Unremarkable  Stomach/Bowel: Unremarkable  Vascular/Lymphatic: Aortoiliac atherosclerotic vascular disease. No adenopathy identified.  Reproductive: Uterus and ovaries absent.  Other: No mesenteric or omental tumor identified. No ascites or nodularity along the liver or paracolic gutters.  Musculoskeletal: Stable sclerotic lesions in the left acetabular roof favoring bone islands. Grade 1 degenerative anterolisthesis at L4-5 without pars defects. Lumbar spondylosis and degenerative disc disease with borderline bilateral foraminal impingement at L5-S1.  IMPRESSION: 1. The only finding of note are several adjacent peripheral abnormal hypodensities along the anterior superior spleen margin, differential diagnostic considerations including small peripheral interval splenic infarcts, splenic injury with subcapsular a fluid collections, or less likely metastatic  disease to the splenic margin. This likely merits observation. 2. Other imaging findings of potential clinical significance: Small type 1 hiatal hernia. Aortoiliac atherosclerotic vascular disease. Lower lumbar spondylosis and degenerative disc disease. Gallstones with potential mild gallbladder wall thickening.  PACs images reviewed by MD  Medications: I have reviewed the patient's current medications.  DISCUSSION She is not interested in GOG 0225 diet and exercise study, tho she plans to do same interventions on her own "I just do not want to be on a trial"  Continued improvement out from chemo discussed.   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 Carboplatin taxol begun 08-25-15,  total 6 cycles completed 12-08-15.Genetics testing no mutations by Breast Ovarian panel. CA 125 back in normal range. She will have repeat CT AP in May due to area questioned at spleen, follow up with Dr Josephina Shih then. I will see her back coordinating with PAC flush. 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 Saw endocrinologist last Oct 2015 (Dr Elijah Birk Cuartelez). 9.minimal remote tobacco 10.elevated cholesterol on lipitor. Filled x1 from this office now, then will be by PCP 11.chemo neutropenia and chemo thrombocytopenia: improved, tho mild thrombocytopenia again today. Will follow up counts. Holding ASA still.    All questions answered. Time spent 25 min including >50% counseling and coordination of care. Route note to Dr Jonni Sanger, cc Dr Janine Limbo, MD   03/08/2016, 7:34 PM

## 2016-03-07 NOTE — Patient Instructions (Signed)

## 2016-03-07 NOTE — Telephone Encounter (Signed)
appt made and avs printed °

## 2016-03-08 ENCOUNTER — Other Ambulatory Visit: Payer: Self-pay | Admitting: Oncology

## 2016-03-08 DIAGNOSIS — C561 Malignant neoplasm of right ovary: Secondary | ICD-10-CM

## 2016-03-08 DIAGNOSIS — D696 Thrombocytopenia, unspecified: Secondary | ICD-10-CM | POA: Insufficient documentation

## 2016-03-08 DIAGNOSIS — Z95828 Presence of other vascular implants and grafts: Secondary | ICD-10-CM | POA: Insufficient documentation

## 2016-03-08 LAB — CANCER ANTIGEN 125 (PARALLEL TESTING): CA 125: 24 U/mL (ref <35)

## 2016-03-08 LAB — CA 125: CANCER ANTIGEN (CA) 125: 24.2 U/mL (ref 0.0–38.1)

## 2016-03-09 ENCOUNTER — Telehealth: Payer: Self-pay

## 2016-03-09 NOTE — Telephone Encounter (Signed)
-----   Message from Gordy Levan, MD sent at 03/08/2016  7:38 PM EDT ----- Labs seen and need follow up: please let her know CA 125 was 24 this week

## 2016-03-09 NOTE — Telephone Encounter (Signed)
Told Ms. Helder the results of the CA-125 as noted below by Dr. Marko Plume.

## 2016-04-06 ENCOUNTER — Ambulatory Visit (HOSPITAL_COMMUNITY)
Admission: RE | Admit: 2016-04-06 | Discharge: 2016-04-06 | Disposition: A | Discharge status: Home / Self Care | Payer: Medicare Other | Source: Ambulatory Visit | Attending: Gynecologic Oncology | Admitting: Gynecologic Oncology

## 2016-04-06 ENCOUNTER — Other Ambulatory Visit (HOSPITAL_BASED_OUTPATIENT_CLINIC_OR_DEPARTMENT_OTHER): Payer: Medicare Other

## 2016-04-06 ENCOUNTER — Encounter (HOSPITAL_COMMUNITY): Payer: Self-pay

## 2016-04-06 ENCOUNTER — Ambulatory Visit: Payer: Medicare Other

## 2016-04-06 VITALS — BP 167/73 | HR 66 | Temp 97.9°F | Resp 16

## 2016-04-06 DIAGNOSIS — C561 Malignant neoplasm of right ovary: Secondary | ICD-10-CM

## 2016-04-06 DIAGNOSIS — Z90710 Acquired absence of both cervix and uterus: Secondary | ICD-10-CM | POA: Diagnosis not present

## 2016-04-06 DIAGNOSIS — Z90722 Acquired absence of ovaries, bilateral: Secondary | ICD-10-CM | POA: Diagnosis not present

## 2016-04-06 DIAGNOSIS — K8020 Calculus of gallbladder without cholecystitis without obstruction: Secondary | ICD-10-CM | POA: Insufficient documentation

## 2016-04-06 LAB — CBC WITH DIFFERENTIAL/PLATELET
BASO%: 1 % (ref 0.0–2.0)
Basophils Absolute: 0 10*3/uL (ref 0.0–0.1)
EOS ABS: 0.1 10*3/uL (ref 0.0–0.5)
EOS%: 4.1 % (ref 0.0–7.0)
HCT: 39.8 % (ref 34.8–46.6)
HGB: 13 g/dL (ref 11.6–15.9)
LYMPH%: 29.8 % (ref 14.0–49.7)
MCH: 30.5 pg (ref 25.1–34.0)
MCHC: 32.7 g/dL (ref 31.5–36.0)
MCV: 93.2 fL (ref 79.5–101.0)
MONO#: 0.5 10*3/uL (ref 0.1–0.9)
MONO%: 12.8 % (ref 0.0–14.0)
NEUT%: 52.3 % (ref 38.4–76.8)
NEUTROS ABS: 1.9 10*3/uL (ref 1.5–6.5)
Platelets: 138 10*3/uL — ABNORMAL LOW (ref 145–400)
RBC: 4.28 10*6/uL (ref 3.70–5.45)
RDW: 13.2 % (ref 11.2–14.5)
WBC: 3.5 10*3/uL — ABNORMAL LOW (ref 3.9–10.3)
lymph#: 1.1 10*3/uL (ref 0.9–3.3)

## 2016-04-06 MED ORDER — SODIUM CHLORIDE 0.9 % IJ SOLN
10.0000 mL | Freq: Once | INTRAMUSCULAR | Status: AC
  Administered 2016-04-06: 10 mL
  Filled 2016-04-06: qty 10

## 2016-04-06 MED ORDER — IOPAMIDOL (ISOVUE-300) INJECTION 61%
100.0000 mL | Freq: Once | INTRAVENOUS | Status: AC | PRN
Start: 2016-04-06 — End: 2016-04-06
  Administered 2016-04-06: 100 mL via INTRAVENOUS

## 2016-04-06 MED ORDER — HEPARIN SOD (PORK) LOCK FLUSH 100 UNIT/ML IV SOLN
500.0000 [IU] | Freq: Once | INTRAVENOUS | Status: AC
  Administered 2016-04-06: 500 [IU]
  Filled 2016-04-06: qty 5

## 2016-04-07 LAB — CA 125: Cancer Antigen (CA) 125: 22.8 U/mL (ref 0.0–38.1)

## 2016-04-08 ENCOUNTER — Other Ambulatory Visit: Payer: Medicare Other

## 2016-04-08 ENCOUNTER — Ambulatory Visit: Payer: Medicare Other | Attending: Gynecology | Admitting: Gynecology

## 2016-04-08 ENCOUNTER — Encounter: Payer: Self-pay | Admitting: Gynecology

## 2016-04-08 VITALS — BP 115/71 | HR 77 | Temp 98.2°F | Resp 18 | Ht 62.75 in | Wt 152.2 lb

## 2016-04-08 DIAGNOSIS — C7982 Secondary malignant neoplasm of genital organs: Secondary | ICD-10-CM | POA: Diagnosis not present

## 2016-04-08 DIAGNOSIS — Z90710 Acquired absence of both cervix and uterus: Secondary | ICD-10-CM | POA: Insufficient documentation

## 2016-04-08 DIAGNOSIS — Z87891 Personal history of nicotine dependence: Secondary | ICD-10-CM | POA: Diagnosis not present

## 2016-04-08 DIAGNOSIS — C561 Malignant neoplasm of right ovary: Secondary | ICD-10-CM | POA: Diagnosis not present

## 2016-04-08 DIAGNOSIS — Z9221 Personal history of antineoplastic chemotherapy: Secondary | ICD-10-CM | POA: Diagnosis not present

## 2016-04-08 DIAGNOSIS — Z8543 Personal history of malignant neoplasm of ovary: Secondary | ICD-10-CM

## 2016-04-08 NOTE — Progress Notes (Signed)
Consult Note: Gyn-Onc   Emily Hull 75 y.o. female  No chief complaint on file.   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 Status post 6 cycles of carboplatin and Taxol completed January 2017. Clinically free of disease. CT scan on 04/06/2016 showing no evidence disease. Recent CA-125 of 22 units per mL (previously 29 units per mL. )  Plan:   CT and laboratory work was reviewed with patient. Going forward we will plan on office visits every 3 months alternating between Dr. Marko Plume and gynecologic oncology. Prior to each visit we'll obtain a CA-125 value. I reiterated to the patient we do not routinely do serial CT scans unless the patient has new symptoms or physical findings. She prefers to keep her Port-A-Cath in place. She is scheduled to have that flushed in July and to see Dr. Marko Plume at that time. I will plan on seeing the patient back on 09/09/2016.   Interval history: Patient returns today  for continuing follow-up. Her prior CT scan 3 months ago showed some questions about splenic lesions. Therefore we repeated a CT scan on 04/06/2016. That scan looks perfectly normal from all aspects. In addition her CA-125 value on  May 17 was 24 units per mL. The patient reports she feels well. She denies any GI GU or pelvic symptoms. Functional status is good.   HPI: 75 year old white married female seen in consultation request of Dr. Dory Horn regarding management of a newly diagnosed high-grade papillary serous carcinoma the ovary. The patient was initially seen for evaluation of vaginal discharge and postmenopausal bleeding. An ultrasound was obtained on 07/02/2015 which showed heterogeneous endometrial echogenicity with a 1.2 cm hypoechoic posterior myometrial mass and endometrial stripe measuring 6-7 mm. In addition the right ovary had a 1.9 cm cystic mass with slightly prominent vascularity. The  left ovary appeared to measure 5. 4 x 4 by 3.9 cm and was cystic with grossly normal ovarian flow. There is a tiny amount of free peritoneal fluid in the cul-de-sac. CA-125was 55 units per mL. Patient underwent a CT scan of the abdomen and pelvis on August 22 showing a 5.2 cm left adnexal mass, colonic diverticulosis gallstones small hiatal hernia. There are no pathologic enlarged lymph nodes and no evidence of peritoneal disease. Patient underwent laparoscopically-assisted total vaginal hysterectomy, bilateral salpingo-oophorectoy.[name] She was found to have a high-grade serous carcinoma arising from the right ovary involving the serosa of the ovary and adjacent fallopian tube. Peritoneal washings were positive. 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 The patient's had an uncomplicated postoperative course.  ( review of the pathology report says that the right ovary contained the malignancy however I wonder if these were mislabeled. Given the 2 imaging studies and Dr. [name] operative note indicating that the malignancy was in the left ovary)  The patient subsequently received 6 cycles of carboplatin and Taxol chemotherapy completed 12/08/2015. Review of Systems:10 point review of systems is negative except as noted in interval history.   Vitals: Blood pressure 115/71, pulse 77, temperature 98.2 F (36.8 C), temperature source Oral, resp. rate 18, height 5' 2.75" (1.594 m), weight 152 lb 3.2 oz (69.037 kg), SpO2 100 %.  Physical Exam: General : The patient is a healthy woman in no acute distress.  HEENT: normocephalic, extraoccular movements normal; neck is supple without thyromegally  Lynphnodes: Supraclavicular and inguinal nodes not enlarged  Abdomen: Soft, non-tender, no ascites, no organomegally, no masses, no hernias , incisions are healing well Pelvic: EGBUS normal.  Vagina is normal vaginal cuff is well-healed.  Cervix and uterus  surgically absent  Bimanual and rectovaginal exam reveal no masses induration or nodularity.  Lower extremities without edema or varicosities.     No Known Allergies  Past Medical History  Diagnosis Date  . Ovarian cancer ([place])   . Cushing's disease ([place])   . Osteopenia   . Elevated cholesterol     Past Surgical History  Procedure Laterality Date  . Vaginal hysterectomy      Current Outpatient Prescriptions  Medication Sig Dispense Refill  . atorvastatin (LIPITOR) 10 MG tablet Take 1 tablet (10 mg total) by mouth daily. 30 tablet 2  . Calcium Citrate-Vitamin D (CALCIUM + D PO) Take 1 tablet by mouth daily.    . Coenzyme Q10 (CO Q 10) 100 MG CAPS Take 1 capsule by mouth daily.     . Glucosamine-Chondroit-Vit C-Mn (GLUCOSAMINE 1500 COMPLEX PO) Take 1 capsule by mouth 2 (two) times daily.    [name] loratadine (CLARITIN) 10 MG tablet Take 10 mg by mouth daily as needed. Reported on 01/15/2016    . Multiple Vitamin (MULTIVITAMIN) capsule Take 1 capsule by mouth daily.     . Omega-3 Fatty Acids (OMEGA-3 FISH OIL) 300 MG CAPS Take 1 capsule by mouth daily.     . vitamin E 400 UNIT capsule Take 400 Units by mouth daily.      No current facility-administered medications for this visit.    Social History   Social History  . Marital Status: Married    Spouse Name: N/A  . Number of Children: N/A  . Years of Education: N/A   Occupational History  . Not on file.   Social History Main Topics  . Smoking status: Former Smoker -- 0.25 packs/day for 2 years    Types: Cigarettes    Quit date: 11/21/1972  . Smokeless tobacco: Never Used  . Alcohol Use: No  . Drug Use: No  . Sexual Activity: Not on file   Other Topics Concern  . Not on file   Social History Narrative    Family History  Problem Relation Age of Onset  . Parkinson's disease Mother 56    w/ dementia  . Heart attack Father     hardening of arteries  . Other Sister 64    hysterectomy due to heavy bleeding; still  has ovaries  . Cancer Maternal Grandmother     maybe liver cancer; dx. at least in 40s-50s  . Heart attack Paternal Grandfather [place], MD 04/08/2016, 2:58 PM

## 2016-04-08 NOTE — Patient Instructions (Signed)
Return to see me in October 2017. We'll obtain a CA-125 prior to that visit.

## 2016-05-29 ENCOUNTER — Other Ambulatory Visit: Payer: Self-pay | Admitting: Oncology

## 2016-05-30 ENCOUNTER — Ambulatory Visit: Payer: Medicare Other

## 2016-05-30 ENCOUNTER — Telehealth: Payer: Self-pay | Admitting: Oncology

## 2016-05-30 ENCOUNTER — Ambulatory Visit (HOSPITAL_BASED_OUTPATIENT_CLINIC_OR_DEPARTMENT_OTHER): Payer: Medicare Other | Admitting: Oncology

## 2016-05-30 ENCOUNTER — Encounter: Payer: Self-pay | Admitting: Oncology

## 2016-05-30 ENCOUNTER — Other Ambulatory Visit (HOSPITAL_BASED_OUTPATIENT_CLINIC_OR_DEPARTMENT_OTHER): Payer: Medicare Other

## 2016-05-30 VITALS — BP 137/67 | HR 63 | Temp 98.7°F | Resp 18 | Ht 62.75 in | Wt 154.3 lb

## 2016-05-30 DIAGNOSIS — M8580 Other specified disorders of bone density and structure, unspecified site: Secondary | ICD-10-CM | POA: Diagnosis not present

## 2016-05-30 DIAGNOSIS — Z95828 Presence of other vascular implants and grafts: Secondary | ICD-10-CM

## 2016-05-30 DIAGNOSIS — D696 Thrombocytopenia, unspecified: Secondary | ICD-10-CM

## 2016-05-30 DIAGNOSIS — H44813 Hemophthalmos, bilateral: Secondary | ICD-10-CM

## 2016-05-30 DIAGNOSIS — C561 Malignant neoplasm of right ovary: Secondary | ICD-10-CM

## 2016-05-30 LAB — COMPREHENSIVE METABOLIC PANEL
ALT: 15 U/L (ref 0–55)
ANION GAP: 10 meq/L (ref 3–11)
AST: 18 U/L (ref 5–34)
Albumin: 3.8 g/dL (ref 3.5–5.0)
Alkaline Phosphatase: 56 U/L (ref 40–150)
BILIRUBIN TOTAL: 0.39 mg/dL (ref 0.20–1.20)
BUN: 19.1 mg/dL (ref 7.0–26.0)
CALCIUM: 10.1 mg/dL (ref 8.4–10.4)
CHLORIDE: 108 meq/L (ref 98–109)
CO2: 24 meq/L (ref 22–29)
CREATININE: 0.8 mg/dL (ref 0.6–1.1)
EGFR: 75 mL/min/{1.73_m2} — AB (ref >90)
Glucose: 108 mg/dl (ref 70–140)
Potassium: 3.9 mEq/L (ref 3.5–5.1)
Sodium: 141 mEq/L (ref 136–145)
Total Protein: 7.1 g/dL (ref 6.4–8.3)

## 2016-05-30 LAB — CBC WITH DIFFERENTIAL/PLATELET
BASO%: 0.7 % (ref 0.0–2.0)
BASOS ABS: 0 10*3/uL (ref 0.0–0.1)
EOS ABS: 0.1 10*3/uL (ref 0.0–0.5)
EOS%: 3.2 % (ref 0.0–7.0)
HEMATOCRIT: 39.9 % (ref 34.8–46.6)
HGB: 13.2 g/dL (ref 11.6–15.9)
LYMPH%: 28.2 % (ref 14.0–49.7)
MCH: 30.7 pg (ref 25.1–34.0)
MCHC: 33.2 g/dL (ref 31.5–36.0)
MCV: 92.5 fL (ref 79.5–101.0)
MONO#: 0.3 10*3/uL (ref 0.1–0.9)
MONO%: 8.7 % (ref 0.0–14.0)
NEUT#: 2.4 10*3/uL (ref 1.5–6.5)
NEUT%: 59.2 % (ref 38.4–76.8)
PLATELETS: 133 10*3/uL — AB (ref 145–400)
RBC: 4.31 10*6/uL (ref 3.70–5.45)
RDW: 13.9 % (ref 11.2–14.5)
WBC: 4 10*3/uL (ref 3.9–10.3)
lymph#: 1.1 10*3/uL (ref 0.9–3.3)

## 2016-05-30 MED ORDER — SODIUM CHLORIDE 0.9% FLUSH
10.0000 mL | INTRAVENOUS | Status: DC | PRN
  Administered 2016-05-30: 10 mL via INTRAVENOUS
  Filled 2016-05-30: qty 10

## 2016-05-30 MED ORDER — HEPARIN SOD (PORK) LOCK FLUSH 100 UNIT/ML IV SOLN
500.0000 [IU] | Freq: Once | INTRAVENOUS | Status: AC
  Administered 2016-05-30: 500 [IU] via INTRAVENOUS
  Filled 2016-05-30: qty 5

## 2016-05-30 NOTE — Patient Instructions (Signed)

## 2016-05-30 NOTE — Telephone Encounter (Signed)
appt made and avs printed °

## 2016-05-30 NOTE — Progress Notes (Signed)
OFFICE PROGRESS NOTE   May 31, 2016   Physicians: Ventura Sellers Verlin Dike Leota Jacobsen, [place], [place] [phone]/ [phone]), Maudie Flakes, gynecology [place], [place]). 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 Last imaging was CT AP 04-06-16, with no evidence of persistent or recurrent gyn malignancy. She saw Dr Josephina Shih 03-2016, will alternate visits gyn onc and med onc every 3 months, following CA 125 with each visit.   She will have colonoscopy 06-06-16 in [place], history of colon polyps ~ 7 yrs ago.   Patient has felt progressively better out further from treatment, feels that she is essentially back to her baseline now. She has had no problems with PAC, but would like this removed. Appetite and energy are good, bowels moving regularly, no abdominal or pelvic discomfort, no bleeding, no LE swelling, no SOB, no bleeding. No recent infectious illness. Vision at baseline, however optometrist has found "bleeding in eyes" ; patient is in agreement with referral to ophthalmology.  Remainder of 10 point Review of Systems negative.   PAC placed by IR 08-2015 Genetics testing normal by [facility] Breast Ovarian panel 12-14-14.  CA 125 55 on 07-07-15, down to 29 on day of cycle 6 chemo.  ONCOLOGIC HISTORY Patient has no prior history of gyn concerns, last gyn exam with PAP Feb 2015 Daiva Eves [place]). She had light spotting ~ Jul 01, 2015, seen at Urgent Care with pelvic US done in [place] system 07-02-15 reportedly with uterus 5.5 x 3.1 x 2.9 cm with endometrial stripe 6-82m, a 1.2 cm hypoechoic posterior myometrial mass, right ovary 3.1 x 2.1 x 2.1 cm with 1.9 cm cyst and slightly prominent vascularity, and left ovary not  clearly identified with 5.4 x 4.0 x 3.9 cm cystin left adnexa. She was referred to Dr RDory Horn with endometrial biopsy benign findings. CA 125 was 55 on 07-07-15. CT [facility] with 5.2 cm cystic lesion in left adnexa with probable thin internal septation, uterus unremarkable, no ascites, also sigmoid diverticulosis and cholelithiasis. She had laparoscopically assisted vaginal hysterectomy, BSO, resection of bilateral cystic adnexal masses and peritoneal washings 07-30-15. Intraoperatively there was no abnormality in upper abdomen, no peritoneal lesions, large cystic smooth mass in left adnexa and in right adnexa a hemorrhagic appearing cystic structure adherent to right pelvic sidewall, no ascites. Surgical Pathology ([place] [phone] [phone] with high grade serous carcinoma in right ovary biopsy/ wedge resection and mucinous cystadenoma with benign tube on left, benign cervix and uterus. Peritoneal washings ([facility] [phone] malignant cells consistent with high grade carcinoma. SHe was seen in consultation by Dr CJosephina Shihon 08-07-15, who recommended proceeding with chemotherapy in preference to completion surgical staging with the delay that would require prior to systemic treatment. Recommendation was 6 cycles of carboplatin taxol with q 3 week dosing. First chemo with carboplatin taxol given 08-25-15, with ANC 0.6 on day 11. Counts subsequently maintained with gCSF (neulasta by on pro) thru cycle 6 on 12-08-15. CT AP 01-11-16 had vague area questioned at anterior superior aspect of spleen, otherwise no findings of concern.   Objective:  Vital signs in last 24 hours: Weight up 2 lbs to 154, BMI 27.6. 137/67, 63 regular, 18 not labored 98.7   Alert, oriented and appropriate, talkative, looks comfortable. Ambulatory without difficulty.  Hair has grown back  HEENT:PERRL, sclerae not icteric. Oral mucosa moist without lesions, posterior pharynx clear.  Neck supple.  No JVD.  Lymphatics:no cervical,supraclavicular, axillary or inguinal adenopathy Resp: clear to auscultation bilaterally and normal percussion bilaterally Cardio: regular rate and rhythm. No gallop. GI: soft, nontender, not distended, no mass or organomegaly. Normally active bowel sounds. Surgical incision not remarkable. Musculoskeletal/ Extremities: without pitting edema, cords, tenderness Neuro: no peripheral neuropathy. Otherwise nonfocal. PSYCH appropriate mood and affect Skin without rash, ecchymosis, petechiae Portacath-without erythema or tenderness  Lab Results:  Results for orders placed or performed in visit on 05/30/16  CBC with Differential  Result Value Ref Range   WBC 4.0 3.9 - 10.3 10e3/uL   NEUT# 2.4 1.5 - 6.5 10e3/uL   HGB 13.2 11.6 - 15.9 g/dL   HCT 39.9 34.8 - 46.6 %   Platelets 133 (L) 145 - 400 10e3/uL   MCV 92.5 79.5 - 101.0 fL   MCH 30.7 25.1 - 34.0 pg   MCHC 33.2 31.5 - 36.0 g/dL   RBC 4.31 3.70 - 5.45 10e6/uL   RDW 13.9 11.2 - 14.5 %   lymph# 1.1 0.9 - 3.3 10e3/uL   MONO# 0.3 0.1 - 0.9 10e3/uL   Eosinophils Absolute 0.1 0.0 - 0.5 10e3/uL   Basophils Absolute 0.0 0.0 - 0.1 10e3/uL   NEUT% 59.2 38.4 - 76.8 %   LYMPH% 28.2 14.0 - 49.7 %   MONO% 8.7 0.0 - 14.0 %   EOS% 3.2 0.0 - 7.0 %   BASO% 0.7 0.0 - 2.0 %  Comprehensive metabolic panel  Result Value Ref Range   Sodium 141 136 - 145 mEq/L   Potassium 3.9 3.5 - 5.1 mEq/L   Chloride 108 98 - 109 mEq/L   CO2 24 22 - 29 mEq/L   Glucose 108 70 - 140 mg/dl   BUN 19.1 7.0 - 26.0 mg/dL   Creatinine 0.8 0.6 - 1.1 mg/dL   Total Bilirubin 0.39 0.20 - 1.20 mg/dL   Alkaline Phosphatase 56 40 - 150 U/L   AST 18 5 - 34 U/L   ALT 15 0 - 55 U/L   Total Protein 7.1 6.4 - 8.3 g/dL   Albumin 3.8 3.5 - 5.0 g/dL   Calcium 10.1 8.4 - 10.4 mg/dL   Anion Gap 10 3 - 11 mEq/L   EGFR 75 (L) >90 ml/min/1.73 m2  CA 125  Result Value Ref Range   Cancer Antigen (CA) 125 21.0 0.0 - 38.1 U/mL      Studies/Results:  EXAM: CT ABDOMEN AND PELVIS WITH CONTRAST 04-06-16  COMPARISON: CT 01/11/2016  FINDINGS: Lower chest: Lung bases are clear.  Hepatobiliary: No focal hepatic lesion. No biliary duct dilatation. Two large gallstones within the gallbladder measuring 2.5 cm. Mild wall thickening. Common bile duct normal caliber.  Pancreas: Pancreas is normal. No ductal dilatation. No pancreatic inflammation.  Spleen: Normal spleen with irregular margin anteriorly less prominent than prior.  Adrenals/urinary tract: Adrenal glands and kidneys are normal. The ureters and bladder normal.  Stomach/Bowel: Small hiatal hernia. Duodenum and small bowel are normal. Colon rectosigmoid colon.  Vascular/Lymphatic: Abdominal aorta is normal caliber with atherosclerotic calcification. There is no retroperitoneal or periportal lymphadenopathy. No pelvic lymphadenopathy.  Reproductive: Post hysterectomy. No nodularity in pelvis.  Other: There is free fluid in. New peritoneal nodularity.  Musculoskeletal: No aggressive osseous lesion.  IMPRESSION: 1. No evidence of a ovarian cancer metastasis. 2. No ascites. 3. Post hysterectomy and oophorectomy. 4. Cholelithiasis with several large  gallstones.   Medications: I have reviewed the patient's current medications.  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 Will request  IR remove PAC. Patient tells me that she would want another PAC if she has to take additional treatment in future. 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 Carboplatin taxol begun  08-25-15, total 6 cycles completed 12-08-15.Genetics testing no mutations by Breast Ovarian panel. CT 03-2016 and CA 125 ok. Follow up Dr Josephina Shih with labs Oct., plan to alternate ~ q 3 mo gyn onc and med onc 2.PAC in, flush every 6-8 weeks 3.osteopenia by bone density scan ~ 2 years ago 4.optometrist reports some intraocular bleeding. No vision changes. Referral made to ophthalmology. 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 Saw endocrinologist last Oct 2015 (Dr Elijah Birk Severance). 9.minimal remote tobacco 10.elevated cholesterol on lipitor. Filled x1 from this office now, then will be by PCP 11.chemo neutropenia resolved. Platelets still just under normal range. Hold ASA at least until ophthalmology evaluation.    All questions answered. Time spent 25 min including >50% counseling and coordination of care. Route PCP, cc Drs Nori Riis, ClarkePearson and Bowen (new patient referral)  Gordy Levan, MD   05/31/2016, 3:44 PM

## 2016-05-31 LAB — CA 125: CANCER ANTIGEN (CA) 125: 21 U/mL (ref 0.0–38.1)

## 2016-06-01 ENCOUNTER — Telehealth: Payer: Self-pay

## 2016-06-01 NOTE — Telephone Encounter (Signed)
-----   Message from Gordy Levan, MD sent at 05/31/2016  7:58 AM EDT ----- Labs seen and need follow up: please let her know CA 125 stable in normal range   thanks

## 2016-06-01 NOTE — Telephone Encounter (Signed)
Told [name] the result of the CA-125 as noted below by Dr. Marko Plume.

## 2016-06-08 ENCOUNTER — Other Ambulatory Visit: Payer: Self-pay | Admitting: Radiology

## 2016-06-09 ENCOUNTER — Ambulatory Visit (HOSPITAL_COMMUNITY)
Admission: RE | Admit: 2016-06-09 | Discharge: 2016-06-09 | Disposition: A | Discharge status: Home / Self Care | Payer: Medicare Other | Source: Ambulatory Visit | Attending: Oncology | Admitting: Oncology

## 2016-06-09 ENCOUNTER — Encounter (HOSPITAL_COMMUNITY): Payer: Self-pay

## 2016-06-09 DIAGNOSIS — E7800 Pure hypercholesterolemia, unspecified: Secondary | ICD-10-CM | POA: Diagnosis not present

## 2016-06-09 DIAGNOSIS — Z79899 Other long term (current) drug therapy: Secondary | ICD-10-CM | POA: Insufficient documentation

## 2016-06-09 DIAGNOSIS — Z87891 Personal history of nicotine dependence: Secondary | ICD-10-CM | POA: Insufficient documentation

## 2016-06-09 DIAGNOSIS — Z452 Encounter for adjustment and management of vascular access device: Secondary | ICD-10-CM | POA: Diagnosis not present

## 2016-06-09 DIAGNOSIS — Z8543 Personal history of malignant neoplasm of ovary: Secondary | ICD-10-CM | POA: Insufficient documentation

## 2016-06-09 DIAGNOSIS — C561 Malignant neoplasm of right ovary: Secondary | ICD-10-CM

## 2016-06-09 LAB — CBC WITH DIFFERENTIAL/PLATELET
Basophils Absolute: 0 10*3/uL (ref 0.0–0.1)
Basophils Relative: 1 %
EOS ABS: 0.1 10*3/uL (ref 0.0–0.7)
Eosinophils Relative: 3 %
HEMATOCRIT: 39.6 % (ref 36.0–46.0)
HEMOGLOBIN: 13.4 g/dL (ref 12.0–15.0)
LYMPHS ABS: 1.2 10*3/uL (ref 0.7–4.0)
LYMPHS PCT: 35 %
MCH: 30.9 pg (ref 26.0–34.0)
MCHC: 33.8 g/dL (ref 30.0–36.0)
MCV: 91.2 fL (ref 78.0–100.0)
MONOS PCT: 15 %
Monocytes Absolute: 0.5 10*3/uL (ref 0.1–1.0)
NEUTROS PCT: 47 %
Neutro Abs: 1.6 10*3/uL — ABNORMAL LOW (ref 1.7–7.7)
Platelets: 117 10*3/uL — ABNORMAL LOW (ref 150–400)
RBC: 4.34 MIL/uL (ref 3.87–5.11)
RDW: 13.3 % (ref 11.5–15.5)
WBC: 3.4 10*3/uL — ABNORMAL LOW (ref 4.0–10.5)

## 2016-06-09 LAB — PROTIME-INR
INR: 0.97 (ref 0.00–1.49)
Prothrombin Time: 13.1 seconds (ref 11.6–15.2)

## 2016-06-09 MED ORDER — SODIUM CHLORIDE 0.9 % IV SOLN
INTRAVENOUS | Status: DC
  Administered 2016-06-09: 10:00:00 via INTRAVENOUS

## 2016-06-09 MED ORDER — LIDOCAINE HCL 1 % IJ SOLN
INTRAMUSCULAR | Status: AC
Start: 2016-06-09 — End: 2016-06-09
  Filled 2016-06-09: qty 20

## 2016-06-09 MED ORDER — CEFAZOLIN SODIUM-DEXTROSE 2-4 GM/100ML-% IV SOLN
2.0000 g | Freq: Once | INTRAVENOUS | Status: AC
  Administered 2016-06-09: 2 g via INTRAVENOUS
  Filled 2016-06-09: qty 100

## 2016-06-09 NOTE — H&P (Signed)
Chief Complaint: Patient was seen in consultation today for port removal at the request of Emily Hull  Referring Physician(s): Emily Hull  Supervising Physician: Emily Hull  Patient Status: Outpatient  History of Present Illness: Emily Hull is a 75 y.o. female with ovarian cancer. She had a port placed on 09/11/15 and has now completed treatment. She is referred for port removal. PMHx, meds, labs, imaging, allergies reviewed. Has been NPO this am. Family at bedside.  Feels well.  Past Medical History  Diagnosis Date  . Ovarian cancer ([place])   . Cushing's disease ([place])   . Osteopenia   . Elevated cholesterol     Past Surgical History  Procedure Laterality Date  . Vaginal hysterectomy      Allergies: Review of patient's allergies indicates no known allergies.  Medications: Prior to Admission medications   Medication Sig Start Date End Date Taking? Authorizing Provider  atorvastatin (LIPITOR) 10 MG tablet Take 1 tablet (10 mg total) by mouth daily. 12/24/15  Yes Emily Marion Downer, MD  Calcium Citrate-Vitamin D (CALCIUM + D PO) Take 1 tablet by mouth daily.   Yes Historical Provider, MD  Coenzyme Q10 (CO Q 10) 100 MG CAPS Take 1 capsule by mouth daily.    Yes Historical Provider, MD  Glucosamine-Chondroit-Vit C-Mn (GLUCOSAMINE 1500 COMPLEX PO) Take 1 capsule by mouth 2 (two) times daily.   Yes Historical Provider, MD  loratadine (CLARITIN) 10 MG tablet Take 10 mg by mouth daily as needed. Reported on 01/15/2016 07/23/15  Yes Historical Provider, MD  Multiple Vitamin (MULTIVITAMIN) capsule Take 1 capsule by mouth daily.    Yes Historical Provider, MD  Omega-3 Fatty Acids (OMEGA-3 FISH OIL) 300 MG CAPS Take 1 capsule by mouth daily.    Yes Historical Provider, MD  Polyethyl Glycol-Propyl Glycol (SYSTANE ULTRA OP) Apply 1 drop to eye 2 (two) times daily at 10 AM and 5 PM.   Yes Historical Provider, MD  vitamin E 400 UNIT capsule Take 400 Units by mouth daily.     Yes Historical Provider, MD  SUPREP BOWEL PREP KIT 17.5-3.13-1.6 GM/180ML SOLN  05/11/16   Historical Provider, MD     Family History  Problem Relation Age of Onset  . Parkinson's disease Mother 43    w/ dementia  . Heart attack Father     hardening of arteries  . Other Sister 15    hysterectomy due to heavy bleeding; still has ovaries  . Cancer Maternal Grandmother     maybe liver cancer; dx. at least in 40s-50s  . Heart attack Paternal Grandfather 29    Social History   Social History  . Marital Status: Married    Spouse Name: N/A  . Number of Children: N/A  . Years of Education: N/A   Social History Main Topics  . Smoking status: Former Smoker -- 0.25 packs/day for 2 years    Types: Cigarettes    Quit date: 11/21/1972  . Smokeless tobacco: Never Used  . Alcohol Use: No  . Drug Use: No  . Sexual Activity: Not Asked   Other Topics Concern  . None   Social History Narrative     Review of Systems: A 12 point ROS discussed and pertinent positives are indicated in the HPI above.  All other systems are negative.  Review of Systems  Vital Signs: BP 145/75 mmHg  Pulse 55  Temp(Src) 97.6 F (36.4 C) (Oral)  Resp 18  Ht 5' 2.75" (1.594 m)  Wt 154 lb (  69.854 kg)  BMI 27.49 kg/m2  SpO2 100%  Physical Exam  Constitutional: She is oriented to person, place, and time. She appears well-developed and well-nourished. No distress.  HENT:  Head: Normocephalic.  Mouth/Throat: Oropharynx is clear and moist.  Neck: Normal range of motion. No tracheal deviation present.  Cardiovascular: Normal rate, regular rhythm and normal heart sounds.  Exam reveals no friction rub.   No murmur heard. Pulmonary/Chest: Effort normal and breath sounds normal. No respiratory distress. She has no wheezes. She has no rales.  Neurological: She is alert and oriented to person, place, and time.  Skin:  (R)chest port palpable. Well healed scar.  Psychiatric: She has a normal mood and affect.  Judgment normal.    Mallampati Score:  MD Evaluation Airway: WNL Heart: WNL Abdomen: WNL Chest/ Lungs: WNL ASA  Classification: 2 Mallampati/Airway Score: One  Imaging: No results found.  Labs:  CBC:  Recent Labs  12/24/15 1151 03/07/16 0934 04/06/16 0935 05/30/16 1153  WBC 7.1 3.3* 3.5* 4.0  HGB 12.2 13.0 13.0 13.2  HCT 36.7 38.3 39.8 39.9  PLT 115* 125* 138* 133*    COAGS:  Recent Labs  09/11/15 0805  INR 0.93  APTT 26    BMP:  Recent Labs  09/11/15 0805  12/15/15 1039 12/24/15 1151 03/07/16 0934 05/30/16 1153  NA 140  < > 140 139 141 141  K 4.0  < > 3.7 4.0 4.1 3.9  CL 111  --   --   --   --   --   CO2 23  < > 25 24 24 24   GLUCOSE 95  < > 91 90 90 108  BUN 17  < > 16.3 13.9 13.2 19.1  CALCIUM 9.7  < > 9.8 9.3 9.7 10.1  CREATININE 0.70  < > 0.8 0.7 0.8 0.8  GFRNONAA >60  --   --   --   --   --   GFRAA >60  --   --   --   --   --   < > = values in this interval not displayed.  LIVER FUNCTION TESTS:  Recent Labs  12/15/15 1039 12/24/15 1151 03/07/16 0934 05/30/16 1153  BILITOT 0.37 0.31 0.39 0.39  AST 19 17 20 18   ALT 15 14 13 15   ALKPHOS 91 75 45 56  PROT 7.8 6.9 7.0 7.1  ALBUMIN 4.3 3.8 3.8 3.8    TUMOR MARKERS: No results for input(s): AFPTM, CEA, CA199, CHROMGRNA in the last 8760 hours.  Assessment and Plan: Ovarian cancer, treatment completed For port removal. Labs pending Discussed procedure, risks, complications including bleeding and infection. Consent signed in chart  Thank you for this interesting consult.  I greatly enjoyed meeting Emily Hull and look forward to participating in their care.  A copy of this report was sent to the requesting provider on this date.  Electronically Signed: Ascencion Hull 06/09/2016, 10:27 AM   I spent a total of 20 minutes in face to face in clinical consultation, greater than 50% of which was counseling/coordinating care for port removal

## 2016-06-09 NOTE — Discharge Instructions (Addendum)
Incision Care °An incision is when a surgeon cuts into your body. After surgery, the incision needs to be cared for properly to prevent infection.  °HOW TO CARE FOR YOUR INCISION °· Take medicines only as directed by your health care provider. °· There are many different ways to close and cover an incision, including stitches, skin glue, and adhesive strips. Follow your health care provider's instructions on: °¨ Incision care. °¨ Bandage (dressing) changes and removal. °¨ Incision closure removal. °· Do not take baths, swim, or use a hot tub until your health care provider approves. You may shower as directed by your health care provider. °· Resume your normal diet and activities as directed. °· Use anti-itch medicine (such as an antihistamine) as directed by your health care provider. The incision may itch while it is healing. Do not pick or scratch at the incision. °· Drink enough fluid to keep your urine clear or pale yellow. °SEEK MEDICAL CARE IF:  °· You have drainage, redness, swelling, or pain at your incision site. °· You have muscle aches, chills, or a general ill feeling. °· You notice a bad smell coming from the incision or dressing. °· Your incision edges separate after the sutures, staples, or skin adhesive strips have been removed. °· You have persistent nausea or vomiting. °· You have a fever. °· You are dizzy. °SEEK IMMEDIATE MEDICAL CARE IF:  °· You have a rash. °· You faint. °· You have difficulty breathing. °MAKE SURE YOU:  °· Understand these instructions. °· Will watch your condition. °· Will get help right away if you are not doing well or get worse. °  °This information is not intended to replace advice given to you by your health care provider. Make sure you discuss any questions you have with your health care provider. °  °Document Released: 05/27/2005 Document Revised: 11/28/2014 Document Reviewed: 01/01/2014 °Elsevier Interactive Patient Education ©2016 Elsevier Inc. ° °

## 2016-07-19 ENCOUNTER — Encounter: Payer: Self-pay | Admitting: Oncology

## 2016-09-07 ENCOUNTER — Other Ambulatory Visit (HOSPITAL_BASED_OUTPATIENT_CLINIC_OR_DEPARTMENT_OTHER): Payer: Medicare Other

## 2016-09-07 DIAGNOSIS — C561 Malignant neoplasm of right ovary: Secondary | ICD-10-CM

## 2016-09-08 LAB — CA 125: CANCER ANTIGEN (CA) 125: 22.4 U/mL (ref 0.0–38.1)

## 2016-09-09 ENCOUNTER — Encounter: Payer: Self-pay | Admitting: Gynecology

## 2016-09-09 ENCOUNTER — Ambulatory Visit: Payer: Medicare Other | Attending: Gynecology | Admitting: Gynecology

## 2016-09-09 VITALS — BP 145/62 | HR 68 | Temp 98.4°F | Resp 18 | Ht 62.75 in | Wt 152.7 lb

## 2016-09-09 DIAGNOSIS — C561 Malignant neoplasm of right ovary: Secondary | ICD-10-CM | POA: Diagnosis not present

## 2016-09-09 DIAGNOSIS — N83202 Unspecified ovarian cyst, left side: Secondary | ICD-10-CM | POA: Diagnosis not present

## 2016-09-09 DIAGNOSIS — Z8543 Personal history of malignant neoplasm of ovary: Secondary | ICD-10-CM | POA: Diagnosis not present

## 2016-09-09 DIAGNOSIS — C569 Malignant neoplasm of unspecified ovary: Secondary | ICD-10-CM | POA: Diagnosis not present

## 2016-09-09 NOTE — Patient Instructions (Signed)
Plan to follow up in six months or sooner if needed.  Please call for any needs or concerns. 

## 2016-09-09 NOTE — Progress Notes (Signed)
Consult Note: Gyn-Onc   Emily Hull 75 y.o. female  Chief Complaint  Patient presents with  . ovarian cancer,right    follow up    Assessment : Stage IIc high-grade serous carcinoma of the right ovary metastatic to the fallopian tube, ovarian surface and positive cytology. Status post 6 cycles of carboplatin and Taxol completed January 2017. Clinically free of disease. CT scan on 04/06/2016 showing no evidence disease. Recent CA-125 of 22 units per mL (previously 21 units per mL. )  Plan:    The patient see Dr. Marko Plume in 3 months return to see [name] in 6 months. We will continue to monitor CA-125 values at each visit. She is given the okay to go ahead with dental work not needing any antibiotics. She will continue with her ophthalmologist.  Interval history: Patient returns today  for continuing follow-up. Overall she's done well and has no complaints. The patient reports she feels well. She denies any GI GU or pelvic symptoms. Functional status is good. CA-125 prior to this visit was 22 units per mL. She saw Dr. Marko Plume 3 months and saw Dr. Nori Riis earlier this month.   HPI: 75 year old white married female seen in consultation request of Dr. Dory Horn regarding management of a newly diagnosed high-grade papillary serous carcinoma the ovary. The patient was initially seen for evaluation of vaginal discharge and postmenopausal bleeding. An ultrasound was obtained on 07/02/2015 which showed heterogeneous endometrial echogenicity with a 1.2 cm hypoechoic posterior myometrial mass and endometrial stripe measuring 6-7 mm. In addition the right ovary had a 1.9 cm cystic mass with slightly prominent vascularity. The left ovary appeared to measure 5. 4 x 4 by 3.9 cm and was cystic with grossly normal ovarian flow. There is a tiny amount of free peritoneal fluid in the cul-de-sac. CA-125was 55 units per mL. Patient underwent a CT scan of the abdomen and pelvis on August 22 showing a 5.2 cm left adnexal mass,  colonic diverticulosis gallstones small hiatal hernia. There are no pathologic enlarged lymph nodes and no evidence of peritoneal disease. Patient underwent laparoscopically-assisted total vaginal hysterectomy, bilateral salpingo-oophorectoy.[name] She was found to have a high-grade serous carcinoma arising from the right ovary involving the serosa of the ovary and adjacent fallopian tube. Peritoneal washings were positive. 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 The patient's had an uncomplicated postoperative course.  ( review of the pathology report says that the right ovary contained the malignancy however I wonder if these were mislabeled. Given the 2 imaging studies and Dr. [name] operative note indicating that the malignancy was in the left ovary)  The patient subsequently received 6 cycles of carboplatin and Taxol chemotherapy completed 12/08/2015. Review of Systems:10 point review of systems is negative except as noted in interval history.   Vitals: Blood pressure (!) 145/62, pulse 68, temperature 98.4 F (36.9 C), temperature source Oral, resp. rate 18, height 5' 2.75" (1.594 m), weight 152 lb 11.2 oz (69.3 kg), SpO2 100 %.  Physical Exam: General : The patient is a healthy woman in no acute distress.  HEENT: normocephalic, extraoccular movements normal; neck is supple without thyromegally  Lynphnodes: Supraclavicular and inguinal nodes not enlarged  Abdomen: Soft, non-tender, no ascites, no organomegally, no masses, no hernias , incisions are healing well Pelvic: EGBUS normal.  Vagina is normal vaginal cuff is well-healed.  Cervix and uterus surgically absent  Bimanual and rectovaginal exam reveal no masses induration or nodularity.  Lower  extremities without edema or varicosities.     No Known Allergies  Past Medical History:  Diagnosis Date  . Cushing's disease ([place])   . Elevated cholesterol   . Osteopenia   .  Ovarian cancer [facility])     Past Surgical History:  Procedure Laterality Date  . VAGINAL HYSTERECTOMY      Current Outpatient Prescriptions  Medication Sig Dispense Refill  . atorvastatin (LIPITOR) 10 MG tablet Take 1 tablet (10 mg total) by mouth daily. 30 tablet 2  . Calcium Citrate-Vitamin D (CALCIUM + D PO) Take 1 tablet by mouth daily.    . Coenzyme Q10 (CO Q 10) 100 MG CAPS Take 1 capsule by mouth daily.     . Glucosamine-Chondroit-Vit C-Mn (GLUCOSAMINE 1500 COMPLEX PO) Take 1 capsule by mouth 2 (two) times daily.    [name] loratadine (CLARITIN) 10 MG tablet Take 10 mg by mouth daily as needed. Reported on 01/15/2016    . Multiple Vitamin (MULTIVITAMIN) capsule Take 1 capsule by mouth daily.     . Omega-3 Fatty Acids (OMEGA-3 FISH OIL) 300 MG CAPS Take 1 capsule by mouth daily.     [name] Glycol-Propyl Glycol (SYSTANE ULTRA OP) Apply 1 drop to eye 2 (two) times daily at 10 AM and 5 PM.    . vitamin E 400 UNIT capsule Take 400 Units by mouth daily.      No current facility-administered medications for this visit.     Social History   Social History  . Marital status: Married    Spouse name: N/A  . Number of children: N/A  . Years of education: N/A   Occupational History  . Not on file.   Social History Main Topics  . Smoking status: Former Smoker    Packs/day: 0.25    Years: 2.00    Types: Cigarettes    Quit date: 11/21/1972  . Smokeless tobacco: Never Used  . Alcohol use No  . Drug use: No  . Sexual activity: Not on file   Other Topics Concern  . Not on file   Social History Narrative  . No narrative on file    Family History  Problem Relation Age of Onset  . Parkinson's disease Mother 70    w/ dementia  . Heart attack Father     hardening of arteries  . Other Sister 5    hysterectomy due to heavy bleeding; still has ovaries  . Cancer Maternal Grandmother     maybe liver cancer; dx. at least in 40s-50s  . Heart attack Paternal Grandfather Hollis, MD 09/09/2016, 1:40 PM

## 2016-11-23 ENCOUNTER — Other Ambulatory Visit: Payer: Self-pay | Admitting: Oncology

## 2016-11-23 DIAGNOSIS — C561 Malignant neoplasm of right ovary: Secondary | ICD-10-CM

## 2016-11-28 ENCOUNTER — Other Ambulatory Visit (HOSPITAL_BASED_OUTPATIENT_CLINIC_OR_DEPARTMENT_OTHER): Payer: Medicare Other

## 2016-11-28 ENCOUNTER — Ambulatory Visit (HOSPITAL_BASED_OUTPATIENT_CLINIC_OR_DEPARTMENT_OTHER): Payer: Medicare Other | Admitting: Oncology

## 2016-11-28 VITALS — BP 140/74 | HR 65 | Temp 97.9°F | Resp 18 | Ht 62.75 in | Wt 149.5 lb

## 2016-11-28 DIAGNOSIS — K8020 Calculus of gallbladder without cholecystitis without obstruction: Secondary | ICD-10-CM

## 2016-11-28 DIAGNOSIS — M8580 Other specified disorders of bone density and structure, unspecified site: Secondary | ICD-10-CM

## 2016-11-28 DIAGNOSIS — C561 Malignant neoplasm of right ovary: Secondary | ICD-10-CM | POA: Diagnosis not present

## 2016-11-28 LAB — COMPREHENSIVE METABOLIC PANEL
ALBUMIN: 4.3 g/dL (ref 3.5–5.0)
ALK PHOS: 50 U/L (ref 40–150)
ALT: 19 U/L (ref 0–55)
AST: 19 U/L (ref 5–34)
Anion Gap: 8 mEq/L (ref 3–11)
BILIRUBIN TOTAL: 0.48 mg/dL (ref 0.20–1.20)
BUN: 16.8 mg/dL (ref 7.0–26.0)
CO2: 28 meq/L (ref 22–29)
CREATININE: 0.8 mg/dL (ref 0.6–1.1)
Calcium: 10.5 mg/dL — ABNORMAL HIGH (ref 8.4–10.4)
Chloride: 106 mEq/L (ref 98–109)
EGFR: 71 mL/min/{1.73_m2} — AB (ref >90)
GLUCOSE: 78 mg/dL (ref 70–140)
Potassium: 3.9 mEq/L (ref 3.5–5.1)
SODIUM: 142 meq/L (ref 136–145)
TOTAL PROTEIN: 7.3 g/dL (ref 6.4–8.3)

## 2016-11-28 LAB — CBC WITH DIFFERENTIAL/PLATELET
BASO%: 0.6 % (ref 0.0–2.0)
Basophils Absolute: 0 10*3/uL (ref 0.0–0.1)
EOS ABS: 0.1 10*3/uL (ref 0.0–0.5)
EOS%: 3.2 % (ref 0.0–7.0)
HCT: 42.2 % (ref 34.8–46.6)
HGB: 14.1 g/dL (ref 11.6–15.9)
LYMPH%: 30.4 % (ref 14.0–49.7)
MCH: 30.7 pg (ref 25.1–34.0)
MCHC: 33.4 g/dL (ref 31.5–36.0)
MCV: 91.9 fL (ref 79.5–101.0)
MONO#: 0.4 10*3/uL (ref 0.1–0.9)
MONO%: 12.2 % (ref 0.0–14.0)
NEUT%: 53.6 % (ref 38.4–76.8)
NEUTROS ABS: 1.7 10*3/uL (ref 1.5–6.5)
Platelets: 141 10*3/uL — ABNORMAL LOW (ref 145–400)
RBC: 4.59 10*6/uL (ref 3.70–5.45)
RDW: 13.5 % (ref 11.2–14.5)
WBC: 3.1 10*3/uL — AB (ref 3.9–10.3)
lymph#: 1 10*3/uL (ref 0.9–3.3)

## 2016-11-28 NOTE — Progress Notes (Signed)
OFFICE PROGRESS NOTE   November 30, 2016   [place], [place], ([place], [place], [place] [phone]/ [phone]), Maudie Flakes, gynecology [place], [place]). 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  She saw Dr Josephina Shih 09-09-16 and will see him again in 02-2017.  Last imaging was CT AP 04-06-16.   Patient has been doing well since she was here last, with no concerns that seem related to the ovarian cancer or that treatment. Energy and appetite are good, no abdominal or pelvic discomfort, bowels moving regularly without laxatives, no peripheral neuropathy. She is exercising and has improved diet, with intentional weight loss of 7 lbs and would like to lose another 10 lbs. She denies SOB or other respiratory symptoms, LE swelling, any bleeding. Gum graft procedure in Nov has healed well, with further interventions for alignment of lower teeth upcoming.  Remainder of 14 point Review of Systems negative.  PAC removed Genetics testing normal by [facility] Breast Ovarian panel 12-14-14.  CA 125 55 on 07-07-15, down to 29 on day of cycle 6 chemo. Flu vaccine 07-28-16  ONCOLOGIC HISTORY Patient has no prior history of gyn concerns, last gyn exam with PAP Feb 2015 [facility] [place]). She had light spotting ~ Jul 01, 2015, seen at Urgent Care with pelvic US done in [place] system 07-02-15 reportedly with uterus 5.5 x 3.1 x 2.9 cm with endometrial stripe 6-10m, a 1.2 cm hypoechoic posterior myometrial mass, right ovary 3.1 x 2.1 x 2.1 cm with 1.9 cm cyst and slightly prominent vascularity, and left ovary not clearly identified with 5.4 x 4.0 x 3.9 cm cystin left adnexa. She was referred to Dr RDory Horn with  endometrial biopsy benign findings. CA 125 was 55 on 07-07-15. CT [facility] with 5.2 cm cystic lesion in left adnexa with probable thin internal septation, uterus unremarkable, no ascites, also sigmoid diverticulosis and cholelithiasis. She had laparoscopically assisted vaginal hysterectomy, BSO, resection of bilateral cystic adnexal masses and peritoneal washings 07-30-15. Intraoperatively there was no abnormality in upper abdomen, no peritoneal lesions, large cystic smooth mass in left adnexa and in right adnexa a hemorrhagic appearing cystic structure adherent to right pelvic sidewall, no ascites. Surgical Pathology (Malen [phone] [id number] with high grade serous carcinoma in right ovary biopsy/ wedge resection and mucinous cystadenoma with benign tube on left, benign cervix and uterus. Peritoneal washings ([phone] malignant cells consistent with high grade carcinoma. SHe was seen in consultation by Dr CJosephina Shihon 08-07-15, who recommended proceeding with chemotherapy in preference to completion surgical staging with the delay that would require prior to systemic treatment. Recommendation was 6 cycles of carboplatin taxol with q 3 week dosing. First chemo with carboplatin taxol given 08-25-15, with ANC 0.6 on day 11. Counts subsequently maintained with gCSF (neulasta by on pro) thru cycle 6 on 12-08-15. CT AP 01-11-16 had vague area questioned at anterior superior aspect of spleen, otherwise no findings of concern. Follow up CT AP 04-06-16 with nothing of concern from standpoint of the gyn cancer   Objective:  Vital signs in last 24 hours:  BP 140/74 (BP Location: Left Arm, Patient Position: Sitting)   Pulse 65   Temp 97.9 F (36.6 C) (Oral)   Resp 18   Ht 5' 2.75" (1.594  m)   Wt 149 lb 8 oz (67.8 kg)   SpO2 100%   BMI 26.69 kg/m  Weight down 5 lbs by these scales from July Alert, oriented and appropriate. Ambulatory and able to get on and off  exam table without difficulty.  No alopecia  HEENT:PERRL, sclerae not icteric. Oral mucosa moist without lesions, posterior pharynx clear.  Neck supple. No JVD.  Lymphatics:no cervical,spuraclavicular, axillary or inguinal adenopathy Resp: clear to auscultation bilaterally and normal percussion bilaterally Cardio: regular rate and rhythm. No gallop. GI: soft, nontender, not distended, no mass or organomegaly. Normally active bowel sounds. Surgical incision not remarkable. Musculoskeletal/ Extremities: LE without pitting edema, cords, tenderness Neuro: no peripheral neuropathy. Otherwise nonfocal. PSYCH appropriate mood and affect Skin without rash, ecchymosis, petechiae. Scar from previous PAC not remarkable   Lab Results:  Results for orders placed or performed in visit on 11/28/16  CBC with Differential  Result Value Ref Range   WBC 3.1 (L) 3.9 - 10.3 10e3/uL   NEUT# 1.7 1.5 - 6.5 10e3/uL   HGB 14.1 11.6 - 15.9 g/dL   HCT 42.2 34.8 - 46.6 %   Platelets 141 (L) 145 - 400 10e3/uL   MCV 91.9 79.5 - 101.0 fL   MCH 30.7 25.1 - 34.0 pg   MCHC 33.4 31.5 - 36.0 g/dL   RBC 4.59 3.70 - 5.45 10e6/uL   RDW 13.5 11.2 - 14.5 %   lymph# 1.0 0.9 - 3.3 10e3/uL   MONO# 0.4 0.1 - 0.9 10e3/uL   Eosinophils Absolute 0.1 0.0 - 0.5 10e3/uL   Basophils Absolute 0.0 0.0 - 0.1 10e3/uL   NEUT% 53.6 38.4 - 76.8 %   LYMPH% 30.4 14.0 - 49.7 %   MONO% 12.2 0.0 - 14.0 %   EOS% 3.2 0.0 - 7.0 %   BASO% 0.6 0.0 - 2.0 %  Comprehensive metabolic panel  Result Value Ref Range   Sodium 142 136 - 145 mEq/L   Potassium 3.9 3.5 - 5.1 mEq/L   Chloride 106 98 - 109 mEq/L   CO2 28 22 - 29 mEq/L   Glucose 78 70 - 140 mg/dl   BUN 16.8 7.0 - 26.0 mg/dL   Creatinine 0.8 0.6 - 1.1 mg/dL   Total Bilirubin 0.48 0.20 - 1.20 mg/dL   Alkaline Phosphatase 50 40 - 150 U/L   AST 19 5 - 34 U/L   ALT 19 0 - 55 U/L   Total Protein 7.3 6.4 - 8.3 g/dL   Albumin 4.3 3.5 - 5.0 g/dL   Calcium 10.5 (H) 8.4 - 10.4 mg/dL   Anion  Gap 8 3 - 11 mEq/L   EGFR 71 (L) >90 ml/min/1.73 m2  CA 125  Result Value Ref Range   Cancer Antigen (CA) 125 18.8 0.0 - 38.1 U/mL  CA 125 available after visit will be communicated to patient, this 22.4 in 08-2016.   Studies/Results:  No results found.   Medications: I have reviewed the patient's current medications. Hold supplemental calcium. Suggested taking Vit D 800 units daily until recheck calcium and check Vit D by PCP  DISCUSSION Medications as above  She will see Dr Josephina Shih as planned 02-2017, with CA 125 then. We will request appointment with Dr Alvy Bimler in 05-2017, tho that could be cancelled if gyn oncology prefers to follow patient just thru that clinic now.  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 Carboplatin  taxol x 6 cycles from 08-25-15 thru 12-08-15. Genetics testing no mutations by Breast Ovarian panel. CT 03-2016 and CA 125 ok. 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 Saw endocrinologist last Oct 2015 (Dr Elijah Birk [place]). 9.minimal remote tobacco 10.elevated cholesterol on lipitor by PCP 11.chemo neutropenia resolved. Platelets essentially normal at 141K (normal range this lab 145k)  No anemia 12.cholelithiasis by CT 03-2016 13.slight elevation of calcium at 10.5: suggested she hold calcium supplement with have PCP recheck calcium and Vit D  All questions answered and patient is in agreement with recommendations and plans, knows to call if  concerns prior to scheduled visit with Dr Josephina Shih. Time spent 20 min including >50% counseling and coordination of care. CC Drs Jonni Sanger and Lu Duffel, MD   11/30/2016, 5:35 PM

## 2016-11-29 ENCOUNTER — Telehealth: Payer: Self-pay

## 2016-11-29 ENCOUNTER — Encounter: Payer: Self-pay | Admitting: Oncology

## 2016-11-29 LAB — CA 125: Cancer Antigen (CA) 125: 18.8 U/mL (ref 0.0–38.1)

## 2016-11-29 NOTE — Telephone Encounter (Signed)
LM in voice mail stating the results of the CA-125 as noted below by Dr. Marko Plume. Emily Hull can callback to the office if she has any questions or concerns regarding this message.

## 2016-11-29 NOTE — Telephone Encounter (Signed)
-----   Message from Gordy Levan, MD sent at 11/29/2016  8:41 AM EST ----- Labs seen and need follow up: please let her know CA 125 is still in normal range, 18.8

## 2016-11-30 ENCOUNTER — Encounter: Payer: Self-pay | Admitting: Oncology

## 2016-12-01 ENCOUNTER — Other Ambulatory Visit: Payer: Self-pay | Admitting: Gynecologic Oncology

## 2016-12-01 DIAGNOSIS — C561 Malignant neoplasm of right ovary: Secondary | ICD-10-CM

## 2016-12-07 ENCOUNTER — Other Ambulatory Visit: Payer: Self-pay | Admitting: Nurse Practitioner

## 2016-12-29 IMAGING — XA IR FLUORO GUIDE CV LINE*R*
1 series · 1 of 1 positions shown · non-contrast
Comparison: None.

INDICATION: History of ovarian cancer. In need of durable intravenous access for
chemotherapy administration.

EXAM:
IMPLANTED PORT A CATH PLACEMENT WITH ULTRASOUND AND FLUOROSCOPIC
GUIDANCE

[Series 1: care single · 1 of 1 slices shown]
[im 1/1]
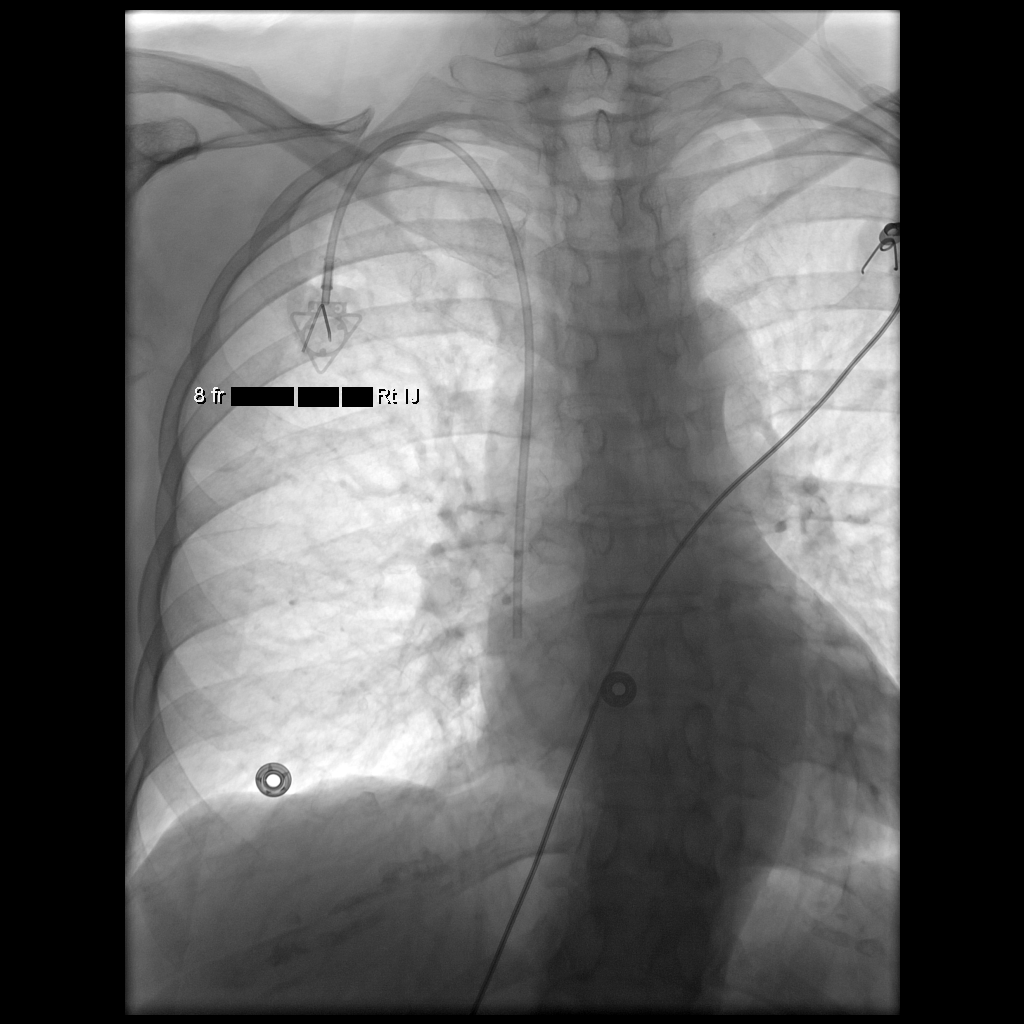

[1 of 1 positions shown; findings below may reference images not displayed]

MEDICATIONS:
Ancef 2 gm IV; The antibiotic was administered within an appropriate
time interval prior to skin puncture.

ANESTHESIA/SEDATION:
Versed 2 mg IV; Fentanyl 50 mcg IV;

Total Moderate Sedation Time

20  minutes.

CONTRAST:  None

FLUOROSCOPY TIME:  12 seconds (1 mGy)

COMPLICATIONS:
None immediate

PROCEDURE:
The procedure, risks, benefits, and alternatives were explained to
the patient. Questions regarding the procedure were encouraged and
answered. The patient understands and consents to the procedure.

The right neck and chest were prepped with chlorhexidine in a
sterile fashion, and a sterile drape was applied covering the
operative field. Maximum barrier sterile technique with sterile
gowns and gloves were used for the procedure. A timeout was
performed prior to the initiation of the procedure. Local anesthesia
was provided with 1% lidocaine with epinephrine.

After creating a small venotomy incision, a micropuncture kit was
utilized to access the internal jugular vein under direct, real-time
ultrasound guidance. Ultrasound image documentation was performed.
The microwire was kinked to measure appropriate catheter length.

A subcutaneous port pocket was then created along the upper chest
wall utilizing a combination of sharp and blunt dissection. The
pocket was irrigated with sterile saline. A single lumen ISP power
injectable port was chosen for placement. The 8 Fr catheter was
tunneled from the port pocket site to the venotomy incision. The
port was placed in the pocket. The external catheter was trimmed to
appropriate length. At the venotomy, an 8 Fr peel-away sheath was
placed over a guidewire under fluoroscopic guidance. The catheter
was then placed through the sheath and the sheath was removed. Final
catheter positioning was confirmed and documented with a
fluoroscopic spot radiograph. The port was accessed with Ragnhildur Eva Lendzioszek
needle, aspirated and flushed with heparinized saline.

The venotomy site was closed with an interrupted 4-0 Vicryl suture.
The port pocket incision was closed with interrupted 2-0 Vicryl
suture and the skin was opposed with a running subcuticular 4-0
Vicryl suture. Dermabond and [place] were applied to both
incisions. Dressings were placed. The patient tolerated the
procedure well without immediate post procedural complication.
FINDINGS: After catheter placement, the tip lies within the superior
cavoatrial junction. The catheter aspirates and flushes normally and
is ready for immediate use.
IMPRESSION: Successful placement of a right internal jugular approach power
injectable Port-A-Cath. The catheter is ready for immediate use.

## 2017-02-23 ENCOUNTER — Ambulatory Visit: Payer: Medicare Other

## 2017-02-23 DIAGNOSIS — C561 Malignant neoplasm of right ovary: Secondary | ICD-10-CM

## 2017-02-24 ENCOUNTER — Encounter: Payer: Self-pay | Admitting: Gynecology

## 2017-02-24 ENCOUNTER — Ambulatory Visit: Payer: Medicare Other | Attending: Gynecology | Admitting: Gynecology

## 2017-02-24 VITALS — BP 140/68 | HR 70 | Temp 97.2°F | Resp 16 | Ht 62.75 in | Wt 142.5 lb

## 2017-02-24 DIAGNOSIS — Z87891 Personal history of nicotine dependence: Secondary | ICD-10-CM | POA: Insufficient documentation

## 2017-02-24 DIAGNOSIS — Z79899 Other long term (current) drug therapy: Secondary | ICD-10-CM | POA: Diagnosis not present

## 2017-02-24 DIAGNOSIS — C561 Malignant neoplasm of right ovary: Secondary | ICD-10-CM | POA: Insufficient documentation

## 2017-02-24 DIAGNOSIS — Z8543 Personal history of malignant neoplasm of ovary: Secondary | ICD-10-CM

## 2017-02-24 LAB — CA 125: CANCER ANTIGEN (CA) 125: 19.1 U/mL (ref 0.0–38.1)

## 2017-02-24 NOTE — Progress Notes (Signed)
Consult Note: Gyn-Onc   Emily Hull 76 y.o. female  Chief Complaint  Patient presents with  . Ovarian Cancer    Assessment : Stage IIc high-grade serous carcinoma of the right ovary metastatic to the fallopian tube, ovarian surface and positive cytology. Status post 6 cycles of carboplatin and Taxol completed January 2017. Clinically free of disease. CT scan on 04/06/2016 showing no evidence disease. Recent CA-125 of 19 units per mL (previously 21 units per mL. )  Plan:    The patient Will return to see [name] in 3 months. We will continue to monitor CA-125 values at each visit.  Interval history: Patient returns today  for continuing follow-up. Overall she's done well and has no complaints. The patient reports she feels well. She denies any GI GU or pelvic symptoms. Functional status is good. CA-125 prior to this visit was 19 units per mL. She saw Dr. Marko Plume 3 months  . She's had some dental work done recently and is lost about 12 pounds because of decreased oral intake. Her functional status is excellent.   HPI: 76 year old white married female seen in consultation request of Dr. Dory Horn regarding management of a newly diagnosed high-grade papillary serous carcinoma the ovary. The patient was initially seen for evaluation of vaginal discharge and postmenopausal bleeding. An ultrasound was obtained on 07/02/2015 which showed heterogeneous endometrial echogenicity with a 1.2 cm hypoechoic posterior myometrial mass and endometrial stripe measuring 6-7 mm. In addition the right ovary had a 1.9 cm cystic mass with slightly prominent vascularity. The left ovary appeared to measure 5. 4 x 4 by 3.9 cm and was cystic with grossly normal ovarian flow. There is a tiny amount of free peritoneal fluid in the cul-de-sac. CA-125was 55 units per mL. Patient underwent a CT scan of the abdomen and pelvis on August 22 showing a 5.2 cm left adnexal mass, colonic diverticulosis gallstones small hiatal hernia. There  are no pathologic enlarged lymph nodes and no evidence of peritoneal disease. Patient underwent laparoscopically-assisted total vaginal hysterectomy, bilateral salpingo-oophorectoy.[name] She was found to have a high-grade serous carcinoma arising from the right ovary involving the serosa of the ovary and adjacent fallopian tube. Peritoneal washings were positive. 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 The patient's had an uncomplicated postoperative course.  ( review of the pathology report says that the right ovary contained the malignancy however I wonder if these were mislabeled. Given the 2 imaging studies and Dr. [name] operative note indicating that the malignancy was in the left ovary)  The patient subsequently received 6 cycles of carboplatin and Taxol chemotherapy completed 12/08/2015.   Review of Systems:10 point review of systems is negative except as noted in interval history.   Vitals: Pulse 70, temperature 97.2 F (36.2 C), temperature source Oral, resp. rate 16, height 5' 2.75" (1.594 m), weight 142 lb 8 oz (64.6 kg), SpO2 100 %.  Physical Exam: General : The patient is a healthy woman in no acute distress.  HEENT: normocephalic, extraoccular movements normal; neck is supple without thyromegally  Lynphnodes: Supraclavicular and inguinal nodes not enlarged  Abdomen: Soft, non-tender, no ascites, no organomegally, no masses, no hernias , incisions are healing well Pelvic: EGBUS normal.  Vagina is normal vaginal cuff is well-healed.  Cervix and uterus surgically absent  Bimanual and rectovaginal exam reveal no masses induration or nodularity.  Lower extremities without edema or varicosities.     No Known Allergies  Past Medical  History:  Diagnosis Date  . Cushing's disease ([place])   . Elevated cholesterol   . Osteopenia   . Ovarian cancer [facility])     Past Surgical History:  Procedure Laterality Date  . VAGINAL  HYSTERECTOMY      Current Outpatient Prescriptions  Medication Sig Dispense Refill  . atorvastatin (LIPITOR) 10 MG tablet Take 1 tablet (10 mg total) by mouth daily. 30 tablet 2  . Calcium Citrate-Vitamin D (CALCIUM + D PO) Take 1 tablet by mouth daily.    . Coenzyme Q10 (CO Q 10) 100 MG CAPS Take 1 capsule by mouth daily.     . Glucosamine-Chondroit-Vit C-Mn (GLUCOSAMINE 1500 COMPLEX PO) Take 1 capsule by mouth 2 (two) times daily.    [name] loratadine (CLARITIN) 10 MG tablet Take 10 mg by mouth daily as needed. Reported on 01/15/2016    . Multiple Vitamin (MULTIVITAMIN) capsule Take 1 capsule by mouth daily.     . Omega-3 Fatty Acids (OMEGA-3 FISH OIL) 300 MG CAPS Take 1 capsule by mouth daily.     [name] Glycol-Propyl Glycol (SYSTANE ULTRA OP) Apply 1 drop to eye 2 (two) times daily at 10 AM and 5 PM.    . vitamin E 400 UNIT capsule Take 400 Units by mouth daily.      No current facility-administered medications for this visit.     Social History   Social History  . Marital status: Married    Spouse name: N/A  . Number of children: N/A  . Years of education: N/A   Occupational History  . Not on file.   Social History Main Topics  . Smoking status: Former Smoker    Packs/day: 0.25    Years: 2.00    Types: Cigarettes    Quit date: 11/21/1972  . Smokeless tobacco: Never Used  . Alcohol use No  . Drug use: No  . Sexual activity: Not on file   Other Topics Concern  . Not on file   Social History Narrative  . No narrative on file    Family History  Problem Relation Age of Onset  . Parkinson's disease Mother 52    w/ dementia  . Heart attack Father     hardening of arteries  . Other Sister 104    hysterectomy due to heavy bleeding; still has ovaries  . Cancer Maternal Grandmother     maybe liver cancer; dx. at least in 40s-50s  . Heart attack Paternal Grandfather [place], MD 02/24/2017, 1:48 PM

## 2017-02-24 NOTE — Patient Instructions (Signed)
Return to see as in 3 months.

## 2017-03-23 ENCOUNTER — Telehealth: Payer: Self-pay | Admitting: *Deleted

## 2017-03-23 NOTE — Telephone Encounter (Signed)
Patient called and scheduled her follow up appt for July. Patient aware of date/time.

## 2017-05-29 ENCOUNTER — Ambulatory Visit: Payer: Medicare Other | Admitting: Hematology and Oncology

## 2017-05-29 ENCOUNTER — Other Ambulatory Visit: Payer: Medicare Other

## 2017-06-02 ENCOUNTER — Other Ambulatory Visit (HOSPITAL_BASED_OUTPATIENT_CLINIC_OR_DEPARTMENT_OTHER): Payer: Medicare Other

## 2017-06-02 ENCOUNTER — Encounter: Payer: Self-pay | Admitting: Gynecology

## 2017-06-02 ENCOUNTER — Ambulatory Visit: Payer: Medicare Other | Attending: Gynecology | Admitting: Gynecology

## 2017-06-02 VITALS — BP 138/74 | HR 56 | Temp 98.0°F | Resp 18 | Wt 143.5 lb

## 2017-06-02 DIAGNOSIS — C7982 Secondary malignant neoplasm of genital organs: Secondary | ICD-10-CM | POA: Insufficient documentation

## 2017-06-02 DIAGNOSIS — C561 Malignant neoplasm of right ovary: Secondary | ICD-10-CM | POA: Diagnosis not present

## 2017-06-02 DIAGNOSIS — Z79899 Other long term (current) drug therapy: Secondary | ICD-10-CM | POA: Diagnosis not present

## 2017-06-02 DIAGNOSIS — Z87891 Personal history of nicotine dependence: Secondary | ICD-10-CM | POA: Insufficient documentation

## 2017-06-02 DIAGNOSIS — E7800 Pure hypercholesterolemia, unspecified: Secondary | ICD-10-CM | POA: Diagnosis not present

## 2017-06-02 DIAGNOSIS — Z9221 Personal history of antineoplastic chemotherapy: Secondary | ICD-10-CM | POA: Diagnosis not present

## 2017-06-02 DIAGNOSIS — Z8543 Personal history of malignant neoplasm of ovary: Secondary | ICD-10-CM | POA: Diagnosis not present

## 2017-06-02 DIAGNOSIS — M8580 Other specified disorders of bone density and structure, unspecified site: Secondary | ICD-10-CM | POA: Insufficient documentation

## 2017-06-02 NOTE — Patient Instructions (Signed)
Return to see [name] in 3 months. We will contact you with her CA-125 report.

## 2017-06-02 NOTE — Progress Notes (Signed)
Consult Note: Gyn-Onc   Emily Hull 76 y.o. female  Chief Complaint  Patient presents with  . 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 Status post 6 cycles of carboplatin and Taxol completed January 2017. Clinically free of disease. CT scan on 04/06/2016 showing no evidence disease. Recent CA-125 of 19 units per mL (previously 21 units per mL. )  Plan:    The patient Will return to see [name] in 3 months. We will continue to monitor CA-125 values at each visit.  Interval history: Patient returns today  for continuing follow-up. Overall she's done well and has no complaints. The patient reports she feels well. She denies any GI GU or pelvic symptoms. Functional status is good.     HPI: 76 year old white married female seen in consultation request of Dr. Dory Horn regarding management of a newly diagnosed high-grade papillary serous carcinoma the ovary. The patient was initially seen for evaluation of vaginal discharge and postmenopausal bleeding. An ultrasound was obtained on 07/02/2015 which showed heterogeneous endometrial echogenicity with a 1.2 cm hypoechoic posterior myometrial mass and endometrial stripe measuring 6-7 mm. In addition the right ovary had a 1.9 cm cystic mass with slightly prominent vascularity. The left ovary appeared to measure 5. 4 x 4 by 3.9 cm and was cystic with grossly normal ovarian flow. There is a tiny amount of free peritoneal fluid in the cul-de-sac. CA-125was 55 units per mL. Patient underwent a CT scan of the abdomen and pelvis on August 22 showing a 5.2 cm left adnexal mass, colonic diverticulosis gallstones small hiatal hernia. There are no pathologic enlarged lymph nodes and no evidence of peritoneal disease. Patient underwent laparoscopically-assisted total vaginal hysterectomy, bilateral salpingo-oophorectoy.[name] She was found to have  a high-grade serous carcinoma arising from the right ovary involving the serosa of the ovary and adjacent fallopian tube. Peritoneal washings were positive. 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 The patient's had an uncomplicated postoperative course.  ( review of the pathology report says that the right ovary contained the malignancy however I wonder if these were mislabeled. Given the 2 imaging studies and Dr. Verlon Hull operative note indicating that the malignancy was in the left ovary)  The patient subsequently received 6 cycles of carboplatin and Taxol chemotherapy completed 12/08/2015.   Review of Systems:10 point review of systems is negative except as noted in interval history.   Vitals: Blood pressure 138/74, pulse (!) 56, temperature 98 F (36.7 C), resp. rate 18, weight 143 lb 8 oz (65.1 kg), SpO2 100 %.  Physical Exam: General : The patient is a healthy woman in no acute distress.  HEENT: normocephalic, extraoccular movements normal; neck is supple without thyromegally  Lynphnodes: Supraclavicular and inguinal nodes not enlarged  Abdomen: Soft, non-tender, no ascites, no organomegally, no masses, no hernias , incisions are healing well Pelvic: EGBUS normal.  Vagina is normal vaginal cuff is well-healed.  Cervix and uterus surgically absent  Bimanual and rectovaginal exam reveal no masses induration or nodularity.  Lower extremities without edema or varicosities.     No Known Allergies  Past Medical History:  Diagnosis Date  . Cushing's disease ([place])   . Elevated cholesterol   . Osteopenia   . Ovarian cancer [facility])     Past Surgical History:  Procedure Laterality Date  . VAGINAL HYSTERECTOMY  Current Outpatient Prescriptions  Medication Sig Dispense Refill  . atorvastatin (LIPITOR) 10 MG tablet Take 1 tablet (10 mg total) by mouth daily. 30 tablet 2  . Coenzyme Q10 (CO Q 10) 100 MG CAPS Take 1  capsule by mouth daily.     . Glucosamine-Chondroit-Vit C-Mn (GLUCOSAMINE 1500 COMPLEX PO) Take 1 capsule by mouth 2 (two) times daily.    [name] loratadine (CLARITIN) 10 MG tablet Take 10 mg by mouth daily as needed. Reported on 01/15/2016    . Multiple Vitamin (MULTIVITAMIN) capsule Take 1 capsule by mouth daily.     . Omega-3 Fatty Acids (OMEGA-3 FISH OIL) 300 MG CAPS Take 1 capsule by mouth daily.     [name] Glycol-Propyl Glycol (SYSTANE ULTRA OP) Apply 1 drop to eye 2 (two) times daily at 10 AM and 5 PM.    . vitamin E 400 UNIT capsule Take 400 Units by mouth daily.      No current facility-administered medications for this visit.     Social History   Social History  . Marital status: Married    Spouse name: N/A  . Number of children: N/A  . Years of education: N/A   Occupational History  . Not on file.   Social History Main Topics  . Smoking status: Former Smoker    Packs/day: 0.25    Years: 2.00    Types: Cigarettes    Quit date: 11/21/1972  . Smokeless tobacco: Never Used  . Alcohol use No  . Drug use: No  . Sexual activity: Not on file   Other Topics Concern  . Not on file   Social History Narrative  . No narrative on file    Family History  Problem Relation Age of Onset  . Parkinson's disease Mother 54       w/ dementia  . Heart attack Father        hardening of arteries  . Other Sister 47       hysterectomy due to heavy bleeding; still has ovaries  . Cancer Maternal Grandmother        maybe liver cancer; dx. at least in 40s-50s  . Heart attack Paternal Grandfather [place], MD 06/02/2017, 11:12 AM

## 2017-06-03 LAB — CA 125: Cancer Antigen (CA) 125: 18.3 U/mL (ref 0.0–38.1)

## 2017-08-28 ENCOUNTER — Ambulatory Visit (HOSPITAL_BASED_OUTPATIENT_CLINIC_OR_DEPARTMENT_OTHER): Payer: Medicare Other

## 2017-08-28 ENCOUNTER — Encounter: Payer: Self-pay | Admitting: Gynecology

## 2017-08-28 ENCOUNTER — Ambulatory Visit: Payer: Medicare Other | Attending: Gynecology | Admitting: Gynecology

## 2017-08-28 VITALS — BP 120/68 | HR 70 | Temp 97.9°F | Resp 18 | Ht 62.5 in | Wt 148.0 lb

## 2017-08-28 DIAGNOSIS — E7800 Pure hypercholesterolemia, unspecified: Secondary | ICD-10-CM | POA: Insufficient documentation

## 2017-08-28 DIAGNOSIS — Z8543 Personal history of malignant neoplasm of ovary: Secondary | ICD-10-CM | POA: Diagnosis not present

## 2017-08-28 DIAGNOSIS — C561 Malignant neoplasm of right ovary: Secondary | ICD-10-CM | POA: Insufficient documentation

## 2017-08-28 DIAGNOSIS — M8580 Other specified disorders of bone density and structure, unspecified site: Secondary | ICD-10-CM | POA: Insufficient documentation

## 2017-08-28 DIAGNOSIS — Z87891 Personal history of nicotine dependence: Secondary | ICD-10-CM | POA: Insufficient documentation

## 2017-08-28 DIAGNOSIS — C7982 Secondary malignant neoplasm of genital organs: Secondary | ICD-10-CM | POA: Insufficient documentation

## 2017-08-28 DIAGNOSIS — Z9221 Personal history of antineoplastic chemotherapy: Secondary | ICD-10-CM

## 2017-08-28 DIAGNOSIS — K449 Diaphragmatic hernia without obstruction or gangrene: Secondary | ICD-10-CM | POA: Insufficient documentation

## 2017-08-28 DIAGNOSIS — Z79899 Other long term (current) drug therapy: Secondary | ICD-10-CM | POA: Diagnosis not present

## 2017-08-28 DIAGNOSIS — K8020 Calculus of gallbladder without cholecystitis without obstruction: Secondary | ICD-10-CM | POA: Diagnosis not present

## 2017-08-28 DIAGNOSIS — C562 Malignant neoplasm of left ovary: Secondary | ICD-10-CM | POA: Diagnosis present

## 2017-08-28 NOTE — Progress Notes (Signed)
Consult Note: Gyn-Onc   Jesse Hirst 76 y.o. female  Chief Complaint  Patient presents with  . 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 Status post 6 cycles of carboplatin and Taxol completed January 2017. Clinically free of disease. CT scan on 04/06/2016 showing no evidence disease. Recent CA-125 is pending  Plan:    The patient Will return to see [name] in 3 months. We will continue to monitor CA-125 values at each visit.  Interval history: Patient returns today  for continuing follow-up. Overall she's done well and has no complaints. The patient reports she feels well. She denies any GI GU or pelvic symptoms. Functional status is good.     HPI: 76 year old white married female seen in consultation request of Dr. Dory Horn regarding management of a newly diagnosed high-grade papillary serous carcinoma the ovary. The patient was initially seen for evaluation of vaginal discharge and postmenopausal bleeding. An ultrasound was obtained on 07/02/2015 which showed heterogeneous endometrial echogenicity with a 1.2 cm hypoechoic posterior myometrial mass and endometrial stripe measuring 6-7 mm. In addition the right ovary had a 1.9 cm cystic mass with slightly prominent vascularity. The left ovary appeared to measure 5. 4 x 4 by 3.9 cm and was cystic with grossly normal ovarian flow. There is a tiny amount of free peritoneal fluid in the cul-de-sac. CA-125was 55 units per mL. Patient underwent a CT scan of the abdomen and pelvis on August 22 showing a 5.2 cm left adnexal mass, colonic diverticulosis gallstones small hiatal hernia. There are no pathologic enlarged lymph nodes and no evidence of peritoneal disease. Patient underwent laparoscopically-assisted total vaginal hysterectomy, bilateral salpingo-oophorectoy.[name] She was found to have a high-grade serous carcinoma arising  from the right ovary involving the serosa of the ovary and adjacent fallopian tube. Peritoneal washings were positive. 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 The patient's had an uncomplicated postoperative course.  ( review of the pathology report says that the right ovary contained the malignancy however I wonder if these were mislabeled. Given the 2 imaging studies and Dr. [name] operative note indicating that the malignancy was in the left ovary)  The patient subsequently received 6 cycles of carboplatin and Taxol chemotherapy completed 12/08/2015.   Review of Systems:10 point review of systems is negative except as noted in interval history.   Vitals: Blood pressure 120/68, pulse 70, temperature 97.9 F (36.6 C), temperature source Oral, resp. rate 18, height 5' 2.5" (1.588 m), weight 148 lb (67.1 kg), SpO2 100 %.  Physical Exam: General : The patient is a healthy woman in no acute distress.  HEENT: normocephalic, extraoccular movements normal; neck is supple without thyromegally  Lynphnodes: Supraclavicular and inguinal nodes not enlarged  Abdomen: Soft, non-tender, no ascites, no organomegally, no masses, no hernias , incisions are healing well Pelvic: EGBUS normal.  Vagina is normal vaginal cuff is well-healed.  Cervix and uterus surgically absent  Bimanual and rectovaginal exam reveal no masses induration or nodularity.  Lower extremities without edema or varicosities.     No Known Allergies  Past Medical History:  Diagnosis Date  . Cushing's disease ([place])   . Elevated cholesterol   . Osteopenia   . Ovarian cancer [facility])     Past Surgical History:  Procedure Laterality Date  . VAGINAL HYSTERECTOMY      Current  Outpatient Prescriptions  Medication Sig Dispense Refill  . atorvastatin (LIPITOR) 10 MG tablet Take 1 tablet (10 mg total) by mouth daily. 30 tablet 2  . Coenzyme Q10 (CO Q 10) 100 MG CAPS  Take 1 capsule by mouth daily.     . Glucosamine-Chondroit-Vit C-Mn (GLUCOSAMINE 1500 COMPLEX PO) Take 1 capsule by mouth 2 (two) times daily.    [name] loratadine (CLARITIN) 10 MG tablet Take 10 mg by mouth daily as needed. Reported on 01/15/2016    . Multiple Vitamin (MULTIVITAMIN) capsule Take 1 capsule by mouth daily.     . Omega-3 Fatty Acids (OMEGA-3 FISH OIL) 300 MG CAPS Take 1 capsule by mouth daily.     [name] Glycol-Propyl Glycol (SYSTANE ULTRA OP) Apply 1 drop to eye 2 (two) times daily at 10 AM and 5 PM.    . vitamin E 400 UNIT capsule Take 400 Units by mouth daily.      No current facility-administered medications for this visit.     Social History   Social History  . Marital status: Married    Spouse name: N/A  . Number of children: N/A  . Years of education: N/A   Occupational History  . Not on file.   Social History Main Topics  . Smoking status: Former Smoker    Packs/day: 0.25    Years: 2.00    Types: Cigarettes    Quit date: 11/21/1972  . Smokeless tobacco: Never Used  . Alcohol use No  . Drug use: No  . Sexual activity: Not on file   Other Topics Concern  . Not on file   Social History Narrative  . No narrative on file    Family History  Problem Relation Age of Onset  . Parkinson's disease Mother 48       w/ dementia  . Heart attack Father        hardening of arteries  . Other Sister 56       hysterectomy due to heavy bleeding; still has ovaries  . Cancer Maternal Grandmother        maybe liver cancer; dx. at least in 40s-50s  . Heart attack Paternal Grandfather [place], MD 08/28/2017, 12:29 PM

## 2017-08-28 NOTE — Patient Instructions (Signed)
Plan on following up in Jan 2019.  Please call in late Nov or Dec to schedule.

## 2017-08-29 LAB — CA 125: CANCER ANTIGEN (CA) 125: 21.1 U/mL (ref 0.0–38.1)

## 2017-10-20 ENCOUNTER — Telehealth: Payer: Self-pay | Admitting: *Deleted

## 2017-10-20 NOTE — Telephone Encounter (Signed)
Patient called and scheduled her lab/follow up appts for January 11th starting at 10am.

## 2017-12-01 ENCOUNTER — Inpatient Hospital Stay: Payer: Medicare Other | Attending: Gynecology | Admitting: Gynecology

## 2017-12-01 ENCOUNTER — Encounter: Payer: Self-pay | Admitting: Gynecology

## 2017-12-01 ENCOUNTER — Inpatient Hospital Stay: Payer: Medicare Other

## 2017-12-01 VITALS — BP 120/60 | HR 80 | Temp 98.2°F | Resp 16 | Ht 62.75 in | Wt 148.8 lb

## 2017-12-01 DIAGNOSIS — Z9221 Personal history of antineoplastic chemotherapy: Secondary | ICD-10-CM | POA: Diagnosis not present

## 2017-12-01 DIAGNOSIS — E7800 Pure hypercholesterolemia, unspecified: Secondary | ICD-10-CM | POA: Insufficient documentation

## 2017-12-01 DIAGNOSIS — E249 Cushing's syndrome, unspecified: Secondary | ICD-10-CM | POA: Diagnosis not present

## 2017-12-01 DIAGNOSIS — C7982 Secondary malignant neoplasm of genital organs: Secondary | ICD-10-CM | POA: Insufficient documentation

## 2017-12-01 DIAGNOSIS — M8580 Other specified disorders of bone density and structure, unspecified site: Secondary | ICD-10-CM | POA: Insufficient documentation

## 2017-12-01 DIAGNOSIS — C561 Malignant neoplasm of right ovary: Secondary | ICD-10-CM

## 2017-12-01 NOTE — Patient Instructions (Signed)
We will call you with the results of your CA-125 drawn today. Please call in may to schedule your follow up appointment in July with Dr. Fermin Schwab. Phone: [phone]

## 2017-12-01 NOTE — Progress Notes (Signed)
Consult Note: Gyn-Onc   Emily Hull 77 y.o. female  Chief Complaint  Patient presents with  . 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 Status post 6 cycles of carboplatin and Taxol completed January 2017. Clinically free of disease. CT scan on 04/06/2016 showing no evidence disease.  Today's CA-125 is pending  Plan:    The patient Will return to see [name] in 6 months. We will continue to monitor CA-125 values at each visit.  We discussed the signs and symptoms of possible recurrent ovarian cancer.  Interval history: Patient returns today  for continuing follow-up. Overall she's done well and has no complaints. The patient reports she feels well. She denies any GI, GU or pelvic symptoms. Functional status is good.     HPI: 77 year old white married female seen in consultation request of Dr. Dory Horn regarding management of a newly diagnosed high-grade papillary serous carcinoma the ovary. The patient was initially seen for evaluation of vaginal discharge and postmenopausal bleeding. An ultrasound was obtained on 07/02/2015 which showed heterogeneous endometrial echogenicity with a 1.2 cm hypoechoic posterior myometrial mass and endometrial stripe measuring 6-7 mm. In addition the right ovary had a 1.9 cm cystic mass with slightly prominent vascularity. The left ovary appeared to measure 5. 4 x 4 by 3.9 cm and was cystic with grossly normal ovarian flow. There is a tiny amount of free peritoneal fluid in the cul-de-sac. CA-125was 55 units per mL. Patient underwent a CT scan of the abdomen and pelvis on August 22 showing a 5.2 cm left adnexal mass, colonic diverticulosis gallstones small hiatal hernia. There are no pathologic enlarged lymph nodes and no evidence of peritoneal disease. Patient underwent laparoscopically-assisted total vaginal hysterectomy, bilateral  salpingo-oophorectoy.[name] She was found to have a high-grade serous carcinoma arising from the right ovary involving the serosa of the ovary and adjacent fallopian tube. Peritoneal washings were positive. 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 The patient's had an uncomplicated postoperative course.  ( review of the pathology report says that the right ovary contained the malignancy however I wonder if these were mislabeled. Given the 2 imaging studies and Dr. [name] operative note indicating that the malignancy was in the left ovary)  The patient subsequently received 6 cycles of carboplatin and Taxol chemotherapy completed 12/08/2015.   Review of Systems:10 point review of systems is negative except as noted in interval history.   Vitals: Blood pressure 120/60, pulse 80, temperature 98.2 F (36.8 C), temperature source Oral, resp. rate 16, height 5' 2.75" (1.594 m), weight 148 lb 12.8 oz (67.5 kg), SpO2 100 %.  Physical Exam: General : The patient is a healthy woman in no acute distress.  HEENT: normocephalic, extraoccular movements normal; neck is supple without thyromegally  Lynphnodes: Supraclavicular and inguinal nodes not enlarged  Abdomen: Soft, non-tender, no ascites, no organomegally, no masses, no hernias , incisions are healing well Pelvic: EGBUS normal.  Vagina is normal vaginal cuff is well-healed.  Cervix and uterus surgically absent  Bimanual and rectovaginal exam reveal no masses induration or nodularity.  Lower extremities without edema or varicosities.     No Known Allergies  Past Medical History:  Diagnosis Date  . Cushing's disease ([place])   . Elevated cholesterol   . Osteopenia   . Ovarian cancer [facility])     Past Surgical  History:  Procedure Laterality Date  . VAGINAL HYSTERECTOMY      Current Outpatient Medications  Medication Sig Dispense Refill  . atorvastatin (LIPITOR) 10 MG tablet Take 1  tablet (10 mg total) by mouth daily. 30 tablet 2  . Coenzyme Q10 (CO Q 10) 100 MG CAPS Take 1 capsule by mouth daily.     . Glucosamine-Chondroit-Vit C-Mn (GLUCOSAMINE 1500 COMPLEX PO) Take 1 capsule by mouth 2 (two) times daily.    [name] loratadine (CLARITIN) 10 MG tablet Take 10 mg by mouth daily as needed. Reported on 01/15/2016    . Multiple Vitamin (MULTIVITAMIN) capsule Take 1 capsule by mouth daily.     . Omega-3 Fatty Acids (OMEGA-3 FISH OIL) 300 MG CAPS Take 1 capsule by mouth daily.     [name] Glycol-Propyl Glycol (SYSTANE ULTRA OP) Apply 1 drop to eye 2 (two) times daily at 10 AM and 5 PM.    . vitamin E 400 UNIT capsule Take 400 Units by mouth daily.      No current facility-administered medications for this visit.     Social History   Socioeconomic History  . Marital status: Married    Spouse name: Not on file  . Number of children: Not on file  . Years of education: Not on file  . Highest education level: Not on file  Social Needs  . Financial resource strain: Not on file  . Food insecurity - worry: Not on file  . Food insecurity - inability: Not on file  . Transportation needs - medical: Not on file  . Transportation needs - non-medical: Not on file  Occupational History  . Not on file  Tobacco Use  . Smoking status: Former Smoker    Packs/day: 0.25    Years: 2.00    Pack years: 0.50    Types: Cigarettes    Last attempt to quit: 11/21/1972    Years since quitting: 45.0  . Smokeless tobacco: Never Used  Substance and Sexual Activity  . Alcohol use: No  . Drug use: No  . Sexual activity: Not on file  Other Topics Concern  . Not on file  Social History Narrative  . Not on file    Family History  Problem Relation Age of Onset  . Parkinson's disease Mother 11       w/ dementia  . Heart attack Father        hardening of arteries  . Other Sister 104       hysterectomy due to heavy bleeding; still has ovaries  . Cancer Maternal Grandmother        maybe  liver cancer; dx. at least in 40s-50s  . Heart attack Paternal Grandfather [place], MD 12/01/2017, 10:44 AM

## 2017-12-02 LAB — CA 125: Cancer Antigen (CA) 125: 31.2 U/mL (ref 0.0–38.1)

## 2017-12-07 ENCOUNTER — Other Ambulatory Visit: Payer: Self-pay | Admitting: Gynecologic Oncology

## 2017-12-07 DIAGNOSIS — R971 Elevated cancer antigen 125 [CA 125]: Secondary | ICD-10-CM

## 2017-12-07 NOTE — Progress Notes (Signed)
CT AP due to increasing CA 125.  HX ovarian ca.  Informed patient of CA 125 results and need for CT scan.  She is to come in for Bmet prior to CT and to pick up her contrast here at the [facility].  Our office will contact her with the dates and times.

## 2017-12-13 ENCOUNTER — Ambulatory Visit (HOSPITAL_COMMUNITY)
Admission: RE | Admit: 2017-12-13 | Discharge: 2017-12-13 | Disposition: A | Discharge status: Home / Self Care | Payer: Medicare Other | Source: Ambulatory Visit | Attending: Gynecologic Oncology | Admitting: Gynecologic Oncology

## 2017-12-13 ENCOUNTER — Inpatient Hospital Stay: Payer: Medicare Other

## 2017-12-13 DIAGNOSIS — Z8543 Personal history of malignant neoplasm of ovary: Secondary | ICD-10-CM | POA: Insufficient documentation

## 2017-12-13 DIAGNOSIS — K8020 Calculus of gallbladder without cholecystitis without obstruction: Secondary | ICD-10-CM | POA: Diagnosis not present

## 2017-12-13 DIAGNOSIS — K449 Diaphragmatic hernia without obstruction or gangrene: Secondary | ICD-10-CM | POA: Insufficient documentation

## 2017-12-13 DIAGNOSIS — I700 Atherosclerosis of aorta: Secondary | ICD-10-CM | POA: Insufficient documentation

## 2017-12-13 DIAGNOSIS — R971 Elevated cancer antigen 125 [CA 125]: Secondary | ICD-10-CM

## 2017-12-13 DIAGNOSIS — K5730 Diverticulosis of large intestine without perforation or abscess without bleeding: Secondary | ICD-10-CM | POA: Insufficient documentation

## 2017-12-13 DIAGNOSIS — R1904 Left lower quadrant abdominal swelling, mass and lump: Secondary | ICD-10-CM | POA: Insufficient documentation

## 2017-12-13 DIAGNOSIS — C561 Malignant neoplasm of right ovary: Secondary | ICD-10-CM | POA: Diagnosis not present

## 2017-12-13 LAB — BASIC METABOLIC PANEL
Anion gap: 11 (ref 3–11)
BUN: 17 mg/dL (ref 7–26)
CO2: 25 mmol/L (ref 22–29)
CREATININE: 0.8 mg/dL (ref 0.60–1.10)
Calcium: 10.5 mg/dL — ABNORMAL HIGH (ref 8.4–10.4)
Chloride: 104 mmol/L (ref 98–109)
GFR calc Af Amer: >60 mL/min (ref >60)
Glucose, Bld: 78 mg/dL (ref 70–140)
Potassium: 4 mmol/L (ref 3.3–4.7)
SODIUM: 140 mmol/L (ref 136–145)

## 2017-12-13 MED ORDER — IOPAMIDOL (ISOVUE-300) INJECTION 61%
INTRAVENOUS | Status: AC
  Filled 2017-12-13: qty 30

## 2017-12-13 MED ORDER — IOPAMIDOL (ISOVUE-300) INJECTION 61%
100.0000 mL | Freq: Once | INTRAVENOUS | Status: AC | PRN
  Administered 2017-12-13: 100 mL via INTRAVENOUS

## 2017-12-13 MED ORDER — IOPAMIDOL (ISOVUE-300) INJECTION 61%
INTRAVENOUS | Status: AC
  Filled 2017-12-13: qty 100

## 2017-12-13 MED ORDER — IOPAMIDOL (ISOVUE-300) INJECTION 61%
30.0000 mL | Freq: Once | INTRAVENOUS | Status: AC | PRN
  Administered 2017-12-13: 30 mL via ORAL

## 2017-12-19 ENCOUNTER — Telehealth: Payer: Self-pay | Admitting: Gynecologic Oncology

## 2017-12-19 NOTE — Telephone Encounter (Addendum)
Returned call to patient.  Discussed CT scan results.  Advised that results have been sent to Dr. Fermin Schwab.  He will review and call patient with recommendations moving forward. No concerns voiced.  All questions answered.  Advised to call for any needs.

## 2017-12-21 ENCOUNTER — Telehealth: Payer: Self-pay | Admitting: Gynecology

## 2017-12-21 NOTE — Telephone Encounter (Signed)
Patient's CA 125 increased to 31u/ml.   A CT scan shows a new 2.8 cm mass in the abdomen consistent with recurrent ovarian cancer.   I called the patient with this information. Given the 2 year platin-free interval, I would recommend re-treatment with Carbo and Taxol.  I would also add Avastin and consider Avastin maintenance if she gets into remission. The patient understands and wants to get started on treatment soon.  She also requested a port. Joylene John will make arrangements with Dr. Alvy Bimler.

## 2017-12-22 ENCOUNTER — Other Ambulatory Visit: Payer: Self-pay | Admitting: Gynecologic Oncology

## 2017-12-22 ENCOUNTER — Ambulatory Visit: Payer: Medicare Other | Admitting: Hematology and Oncology

## 2017-12-22 DIAGNOSIS — C561 Malignant neoplasm of right ovary: Secondary | ICD-10-CM

## 2017-12-29 ENCOUNTER — Inpatient Hospital Stay: Payer: Medicare Other | Attending: Gynecology | Admitting: Hematology and Oncology

## 2017-12-29 ENCOUNTER — Telehealth: Payer: Self-pay | Admitting: *Deleted

## 2017-12-29 ENCOUNTER — Telehealth: Payer: Self-pay | Admitting: Hematology and Oncology

## 2017-12-29 ENCOUNTER — Encounter: Payer: Self-pay | Admitting: Hematology and Oncology

## 2017-12-29 ENCOUNTER — Other Ambulatory Visit: Payer: Self-pay | Admitting: Hematology and Oncology

## 2017-12-29 VITALS — BP 120/86 | HR 70 | Temp 97.9°F | Resp 18 | Ht 62.75 in | Wt 147.8 lb

## 2017-12-29 DIAGNOSIS — C561 Malignant neoplasm of right ovary: Secondary | ICD-10-CM | POA: Diagnosis present

## 2017-12-29 DIAGNOSIS — Z5112 Encounter for antineoplastic immunotherapy: Secondary | ICD-10-CM | POA: Diagnosis not present

## 2017-12-29 DIAGNOSIS — Z862 Personal history of diseases of the blood and blood-forming organs and certain disorders involving the immune mechanism: Secondary | ICD-10-CM | POA: Diagnosis not present

## 2017-12-29 DIAGNOSIS — Z5111 Encounter for antineoplastic chemotherapy: Secondary | ICD-10-CM | POA: Diagnosis present

## 2017-12-29 DIAGNOSIS — Z7189 Other specified counseling: Secondary | ICD-10-CM | POA: Diagnosis not present

## 2017-12-29 MED ORDER — ONDANSETRON HCL 8 MG PO TABS: 8.0000 mg | ORAL_TABLET | Freq: Two times a day (BID) | ORAL | 1 refills | Status: DC | PRN

## 2017-12-29 MED ORDER — PROCHLORPERAZINE MALEATE 10 MG PO TABS: 10.0000 mg | ORAL_TABLET | Freq: Four times a day (QID) | ORAL | 1 refills | Status: DC | PRN

## 2017-12-29 MED ORDER — DEXAMETHASONE 4 MG PO TABS: ORAL_TABLET | ORAL | 0 refills | Status: DC

## 2017-12-29 MED ORDER — LIDOCAINE-PRILOCAINE 2.5-2.5 % EX CREA: TOPICAL_CREAM | CUTANEOUS | 3 refills | Status: AC

## 2017-12-29 NOTE — Assessment & Plan Note (Signed)
The patient is aware she has incurable disease and treatment is strictly palliative. We discussed importance of Advanced Directives and Living will. She has a copy of her living will at home. We discussed CODE STATUS; the patient desires to remain in full code.

## 2017-12-29 NOTE — Telephone Encounter (Signed)
Gave avs and calendar for February and march °

## 2017-12-29 NOTE — Telephone Encounter (Signed)
Called [facility] and requested additional testing listed below.

## 2017-12-29 NOTE — Telephone Encounter (Signed)
-----   Message from Heath Lark, MD sent at 12/29/2017  2:50 PM EST ----- Regarding: IR to draw CBC and CMP She has IR planned for 2/13. Can you ask them to draw CBC and CMP please?  Thanks

## 2017-12-29 NOTE — Telephone Encounter (Signed)
LM for Tiffany in IR to draw labs on 2/13

## 2017-12-29 NOTE — Assessment & Plan Note (Addendum)
I have reviewed her records in great detail I will get pathologist to add ER/PR positivity on previous tumor sample for future reference We reviewed the NCCN guidelines We discussed the role of chemotherapy. The intent is of palliative intent.  We discussed some of the risks, benefits, side-effects of carboplatin & Taxol. Treatment is intravenous, every 3 weeks x 6 cycles  Some of the short term side-effects included, though not limited to, including weight loss, life threatening infections, risk of allergic reactions, need for transfusions of blood products, nausea, vomiting, change in bowel habits, loss of hair, admission to hospital for various reasons, and risks of death.   Long term side-effects are also discussed including risks of infertility, permanent damage to nerve function, hearing loss, chronic fatigue, kidney damage with possibility needing hemodialysis, and rare secondary malignancy including bone marrow disorders.  The patient is aware that the response rates discussed earlier is not guaranteed.  After a long discussion, patient made an informed decision to proceed with the prescribed plan of care.   Patient education material was dispensed. We discussed premedication with dexamethasone before chemotherapy. She has port placement scheduled We also recommend the addition of Avastin to be given every 3 weeks and then followed by maintenance treatment The risks, benefits, side effects of Avastin is discussed including risk of thrombosis, uncontrolled hypertension, nephrotic syndrome, bleeding, GI perforation and allergic reaction and she agreed to proceed  She had prior history of neutropenia She will receive G-CSF support with each cycle

## 2017-12-29 NOTE — Progress Notes (Signed)
[place] progress notes  Patient Care Team: Katherina Mires, MD as PCP - General (Family Medicine)  CHIEF COMPLAINTS/PURPOSE OF VISIT:  Recurrent ovarian cancer, for further management  HISTORY OF PRESENTING ILLNESS:  Emily Hull 77 y.o. female was transferred to my care after her prior physician has left.  I reviewed the patient's records extensive and collaborated the history with the patient. 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 In a postmenopausal female, consider continued annual imaging followup with CT or MRI versus surgical evaluation.  Colonic diverticulosis. No radiographic evidence of diverticulitis.  Cholelithiasis.  No radiographic evidence of cholecystitis.  Small hiatal hernia.      07/30/2015 Pathology Results    Outside pathology showed right ovary with high-grade serous carcinoma, 2 cm in maximum dimension.  The tumor involves serosal surface of the right ovary and adjacent right fallopian tube.  Cervix and endometrium were within normal limits. ER positive Peritoneal washing was positive.      07/30/2015 Surgery    SHe underwent laparoscopic-assisted total vaginal hysterectomy, bilateral salpingo-oophorectomy      07/30/2015 Pathology Results    PERITONEAL WASHING PELVIC (SPECIMEN 1 OF 1, COLLECTED ON 07/30/2015): MALIGNANT CELLS CONSISTENT WITH HIGH GRADE CARCINOMA      08/13/2015 Tumor Marker    Patient's tumor was tested for the following markers: CA-125 Results of the tumor marker test revealed 96      08/25/2015 - 12/08/2015 Chemotherapy    The patient had 6 cycles of carboplatin and  taxol      09/07/2015 Tumor Marker    Patient's tumor was tested for the following markers: CA-125 Results of the tumor marker test revealed 51      10/05/2015 Tumor Marker    Patient's tumor was tested for the following markers: CA-125 Results of the tumor marker test revealed 29      11/09/2015 Tumor Marker    Patient's tumor was tested for the following markers: CA-125 Results of the tumor marker test revealed 44      12/08/2015 Tumor Marker    Patient's tumor was tested for the following markers: CA-125 Results of the tumor marker test revealed 30      12/15/2015 Genetic Testing    Genetics testing normal by [facility] Breast Ovarian panel 12-15-15      01/11/2016 Imaging    1. The only finding of note are several adjacent peripheral abnormal hypodensities along the anterior superior spleen margin, differential diagnostic considerations including small peripheral interval splenic infarcts, splenic injury with subcapsular a fluid collections, or less likely metastatic disease to the splenic margin. This likely merits observation. 2. Other imaging findings of potential clinical significance: Small type 1 hiatal hernia. Aortoiliac atherosclerotic vascular disease. Lower lumbar spondylosis and degenerative disc disease. Gallstones with potential mild gallbladder wall thickening.      03/07/2016 Tumor Marker    Patient's tumor was tested for the following markers: CA-125 Results of the tumor marker test revealed 24.2      04/06/2016 Imaging    1. No evidence of a ovarian cancer metastasis. 2. No ascites. 3. Post  hysterectomy and oophorectomy. 4. Cholelithiasis with several large gallstones      04/06/2016 Tumor Marker    Patient's tumor was tested for the following markers: CA-125 Results of the tumor marker test revealed 22.8      05/30/2016 Tumor Marker    Patient's tumor was tested for the following markers: CA-125 Results of the tumor marker test revealed 21       09/07/2016 Tumor Marker    Patient's tumor was tested for the following markers: CA-125 Results of the tumor marker test revealed 22.4      11/28/2016 Tumor Marker    Patient's tumor was tested for the following markers: CA-125 Results of the tumor marker test revealed 18.8      02/23/2017 Tumor Marker    Patient's tumor was tested for the following markers: CA-125 Results of the tumor marker test revealed 19.1      06/02/2017 Tumor Marker    Patient's tumor was tested for the following markers: CA-125 Results of the tumor marker test revealed 18.3      08/28/2017 Tumor Marker    Patient's tumor was tested for the following markers: CA-125 Results of the tumor marker test revealed 21.1      12/01/2017 Tumor Marker    Patient's tumor was tested for the following markers: CA-125 Results of the tumor marker test revealed 31.2      12/13/2017 Imaging    New 2.7 cm peritoneal soft tissue mass in anterior left lower quadrant, consistent with metastatic disease.  No other sites of metastatic disease identified.  Incidental findings including: Cholelithiasis. Colonic diverticulosis. Tiny hiatal hernia. Aortic atherosclerosis.      She is here with her son today. She denies pain, abdominal or bowel habits changes She denies postmenopausal bleeding She denies residual peripheral neuropathy from prior treatment She tolerated prior chemotherapy well except for neutropenia, responded well to G-CSF support  MEDICAL HISTORY:  Past Medical History:  Diagnosis Date  . Cushing's disease ([place])   . Elevated cholesterol   . Osteopenia   . Ovarian cancer [facility])     SURGICAL HISTORY: Past Surgical History:  Procedure Laterality Date  . VAGINAL HYSTERECTOMY      SOCIAL HISTORY: Social History   Socioeconomic History  . Marital status: Married    Spouse name: Not on file  . Number of children: 2  . Years of education: Not on file  . Highest education level: Not on file  Social  Needs  . Financial resource strain: Not on file  . Food insecurity - worry: Not on file  . Food insecurity - inability: Not on file  . Transportation needs - medical: Not on file  . Transportation needs - non-medical: Not on file  Occupational History  . Occupation: retired Pharmacist, hospital  Tobacco Use  . Smoking status: Former Smoker    Packs/day: 0.25    Years: 2.00    Pack years: 0.50    Types: Cigarettes    Last attempt to quit: 11/21/1972    Years since quitting: 45.1  . Smokeless tobacco: Never Used  Substance and Sexual Activity  . Alcohol use: No  . Drug use: No  . Sexual activity: Not on file  Other Topics Concern  . Not on file  Social History Narrative  . Not on file    FAMILY HISTORY: Family History  Problem Relation Age of Onset  . Parkinson's disease Mother 27       w/ dementia  . Heart attack Father  hardening of arteries  . Other Sister 43       hysterectomy due to heavy bleeding; still has ovaries  . Cancer Maternal Grandmother        maybe liver cancer; dx. at least in 40s-50s  . Heart attack Paternal Grandfather 5    ALLERGIES:  has No Known Allergies.  MEDICATIONS:  Current Outpatient Medications  Medication Sig Dispense Refill  . atorvastatin (LIPITOR) 10 MG tablet Take 1 tablet (10 mg total) by mouth daily. 30 tablet 2  . Coenzyme Q10 (CO Q 10) 100 MG CAPS Take 1 capsule by mouth daily.     [name] dexamethasone (DECADRON) 4 MG tablet Take 5 tabs at midnight and 5 tabs at 6 am the morning of chemotherapy, every 3 weeks 60 tablet 0  . Glucosamine-Chondroit-Vit C-Mn (GLUCOSAMINE 1500 COMPLEX PO) Take 1 capsule by mouth 2 (two) times daily.    [name] lidocaine-prilocaine (EMLA) cream Apply to affected area once 30 g 3  . loratadine (CLARITIN) 10 MG tablet Take 10 mg by mouth daily as needed. Reported on 01/15/2016    . Multiple Vitamin (MULTIVITAMIN) capsule Take 1 capsule by mouth daily.     . Omega-3 Fatty Acids (OMEGA-3 FISH OIL) 300 MG CAPS Take 1 capsule  by mouth daily.     . ondansetron (ZOFRAN) 8 MG tablet Take 1 tablet (8 mg total) by mouth 2 (two) times daily as needed for refractory nausea / vomiting. Start on day 3 after chemo. 30 tablet 1  . Polyethyl Glycol-Propyl Glycol (SYSTANE ULTRA OP) Apply 1 drop to eye 2 (two) times daily at 10 AM and 5 PM.    . prochlorperazine (COMPAZINE) 10 MG tablet Take 1 tablet (10 mg total) by mouth every 6 (six) hours as needed (Nausea or vomiting). 60 tablet 1  . vitamin E 400 UNIT capsule Take 400 Units by mouth daily.      No current facility-administered medications for this visit.     REVIEW OF SYSTEMS:   Constitutional: Denies fevers, chills or abnormal night sweats Eyes: Denies blurriness of vision, double vision or watery eyes Ears, nose, mouth, throat, and face: Denies mucositis or sore throat Respiratory: Denies cough, dyspnea or wheezes Cardiovascular: Denies palpitation, chest discomfort or lower extremity swelling Gastrointestinal:  Denies nausea, heartburn or change in bowel habits Skin: Denies abnormal skin rashes Lymphatics: Denies new lymphadenopathy or easy bruising Neurological:Denies numbness, tingling or new weaknesses Behavioral/Psych: Mood is stable, no new changes  All other systems were reviewed with the patient and are negative.  PHYSICAL EXAMINATION: ECOG PERFORMANCE STATUS: 0 - Asymptomatic  Vitals:   12/29/17 1402  BP: 120/86  Pulse: 70  Resp: 18  Temp: 97.9 F (36.6 C)  SpO2: 100%   Filed Weights   12/29/17 1402  Weight: 147 lb 12.8 oz (67 kg)    GENERAL:alert, no distress and comfortable SKIN: skin color, texture, turgor are normal, no rashes or significant lesions EYES: normal, conjunctiva are pink and non-injected, sclera clear OROPHARYNX:no exudate, normal lips, buccal mucosa, and tongue  NECK: supple, thyroid normal size, non-tender, without nodularity LYMPH:  no palpable lymphadenopathy in the cervical, axillary or inguinal LUNGS: clear to  auscultation and percussion with normal breathing effort HEART: regular rate & rhythm and no murmurs without lower extremity edema ABDOMEN:abdomen soft, non-tender and normal bowel sounds Musculoskeletal:no cyanosis of digits and no clubbing  PSYCH: alert & oriented x 3 with fluent speech NEURO: no focal motor/sensory deficits  LABORATORY DATA:  I have reviewed  the data as listed Lab Results  Component Value Date   WBC 3.1 (L) 11/28/2016   HGB 14.1 11/28/2016   HCT 42.2 11/28/2016   MCV 91.9 11/28/2016   PLT 141 (L) 11/28/2016   Recent Labs    12/13/17 1114  NA 140  K 4.0  CL 104  CO2 25  GLUCOSE 78  BUN 17  CREATININE 0.80  CALCIUM 10.5*  GFRNONAA >60  GFRAA >60    RADIOGRAPHIC STUDIES: I have reviewed the imaging study with the patient and her son I have personally reviewed the radiological images as listed and agreed with the findings in the report. Ct Abdomen Pelvis W Contrast  Result Date: 12/13/2017 CLINICAL DATA:  Ovarian carcinoma. Previous surgery and chemotherapy. Elevated CA 125 level. EXAM: CT ABDOMEN AND PELVIS WITH CONTRAST TECHNIQUE: Multidetector CT imaging of the abdomen and pelvis was performed using the standard protocol following bolus administration of intravenous contrast. CONTRAST:  171mL ISOVUE-300 IOPAMIDOL (ISOVUE-300) INJECTION 61% COMPARISON:  04/06/2016 FINDINGS: Lower Chest: No acute findings. Hepatobiliary: No hepatic masses identified. Multiple gallstones are again seen. No evidence of acute cholecystitis or biliary ductal dilatation. Pancreas:  No mass or inflammatory changes. Spleen: Within normal limits in size and appearance. Adrenals/Urinary Tract: No masses identified. No evidence of hydronephrosis. Unremarkable unopacified urinary bladder. Stomach/Bowel: Tiny hiatal hernia again noted. No evidence of obstruction, inflammatory process or abnormal fluid collections. Diverticulosis of descending and sigmoid colon seen, without evidence of  diverticulitis. Vascular/Lymphatic: No pathologically enlarged lymph nodes. No abdominal aortic aneurysm. Aortic atherosclerosis. Reproductive: Prior hysterectomy. No soft tissue mass identified within the hysterectomy bed or adnexal regions. Other: A new peritoneal soft tissue mass is seen in the anterior left lower quadrant, measuring 2.7 x 1.9 cm on image 49/2. This is consistent with metastatic disease. No other peritoneal masses or ascites identified. Musculoskeletal:  No suspicious bone lesions identified. IMPRESSION: New 2.7 cm peritoneal soft tissue mass in anterior left lower quadrant, consistent with metastatic disease. No other sites of metastatic disease identified. Incidental findings including: Cholelithiasis. Colonic diverticulosis. Tiny hiatal hernia. Aortic atherosclerosis. Electronically Signed   By: Earle Gell M.D.   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 The intent is of palliative intent.  We discussed some of the risks, benefits, side-effects of carboplatin & Taxol. Treatment is intravenous, every 3 weeks x 6 cycles  Some of the short term side-effects included, though not limited to, including weight loss, life threatening infections, risk of allergic reactions, need for transfusions of blood products, nausea, vomiting, change in bowel habits, loss of hair, admission to hospital for various reasons, and risks of death.   Long term side-effects are also discussed including risks of infertility, permanent damage to nerve function, hearing loss, chronic fatigue, kidney damage with possibility needing hemodialysis, and rare secondary malignancy including bone marrow disorders.  The patient is aware that the response rates discussed  earlier is not guaranteed.  After a long discussion, patient made an informed decision to proceed with the prescribed plan of care.   Patient education material was dispensed. We discussed premedication with dexamethasone before chemotherapy. She has port placement scheduled We also recommend the addition of Avastin to be given every 3 weeks and then followed by maintenance treatment The risks,  benefits, side effects of Avastin is discussed including risk of thrombosis, uncontrolled hypertension, nephrotic syndrome, bleeding, GI perforation and allergic reaction and she agreed to proceed  She had prior history of neutropenia She will receive G-CSF support with each cycle   Goals of care, counseling/discussion The patient is aware she has incurable disease and treatment is strictly palliative. We discussed importance of Advanced Directives and Living will. She has a copy of her living will at home. We discussed CODE STATUS; the patient desires to remain in full code.   Orders Placed This Encounter  Procedures  . CBC with Differential (Cancer Center Only)    Standing Status:   Standing    Number of Occurrences:   20    Standing Expiration Date:   12/29/2018  . CMP ([place] only)    Standing Status:   Standing    Number of Occurrences:   20    Standing Expiration Date:   12/29/2018  . UA Protein, Dipstick - [facility]    Standing Status:   Standing    Number of Occurrences:   33    Standing Expiration Date:   12/29/2018  . CA 125    Standing Status:   Standing    Number of Occurrences:   9    Standing Expiration Date:   12/29/2018    All questions were answered. The patient knows to call the clinic with any problems, questions or concerns. I spent 60 minutes counseling the patient face to face. The total time spent in the appointment was 80 minutes and more than 50% was on counseling.     Heath Lark, MD 12/29/2017 2:54 PM

## 2017-12-29 NOTE — Telephone Encounter (Signed)
-----   Message from Heath Lark, MD sent at 12/29/2017  2:55 PM EST ----- Regarding: [place] pathology She had her surgery in 2016, sent to [facility]. Can you get pathologist to add ER/PR quantification testing (not just positive, how positive) on her resection specimen (07/30/15)  Thanks

## 2017-12-29 NOTE — Progress Notes (Signed)
START ON PATHWAY REGIMEN - Ovarian     A cycle is every 21 days:     Paclitaxel      Bevacizumab      Carboplatin   **Always confirm dose/schedule in your pharmacy ordering system**    Patient Characteristics: Recurrent or Progressive Disease, Second Line Therapy, Platinum Sensitive and ? 6 Months Since Last Therapy Therapeutic Status: Recurrent or Progressive Disease AJCC T Category: T3 AJCC N Category: N0 AJCC M Category: M0 AJCC 8 Stage Grouping: Unknown BRCA Mutation Status: Absent Line of Therapy: Second Line Would you be surprised if this patient died  in the next year<= I would be surprised if this patient died in the next year Progression on neoadjuvant therapy<= No Intent of Therapy: Non-Curative / Palliative Intent, Discussed with Patient

## 2018-01-01 ENCOUNTER — Other Ambulatory Visit: Payer: Self-pay | Admitting: Radiology

## 2018-01-02 ENCOUNTER — Other Ambulatory Visit: Payer: Self-pay | Admitting: Radiology

## 2018-01-02 ENCOUNTER — Other Ambulatory Visit: Payer: Self-pay | Admitting: General Surgery

## 2018-01-03 ENCOUNTER — Telehealth: Payer: Self-pay | Admitting: *Deleted

## 2018-01-03 ENCOUNTER — Ambulatory Visit (HOSPITAL_COMMUNITY)
Admission: RE | Admit: 2018-01-03 | Discharge: 2018-01-03 | Disposition: A | Discharge status: Home / Self Care | Payer: Medicare Other | Source: Ambulatory Visit | Attending: Gynecology | Admitting: Gynecology

## 2018-01-03 ENCOUNTER — Other Ambulatory Visit: Payer: Self-pay | Admitting: Gynecologic Oncology

## 2018-01-03 ENCOUNTER — Ambulatory Visit (HOSPITAL_COMMUNITY)
Admission: RE | Admit: 2018-01-03 | Discharge: 2018-01-03 | Disposition: A | Discharge status: Home / Self Care | Payer: Medicare Other | Source: Ambulatory Visit | Attending: Gynecologic Oncology | Admitting: Gynecologic Oncology

## 2018-01-03 ENCOUNTER — Encounter (HOSPITAL_COMMUNITY): Payer: Self-pay

## 2018-01-03 DIAGNOSIS — C569 Malignant neoplasm of unspecified ovary: Secondary | ICD-10-CM | POA: Insufficient documentation

## 2018-01-03 DIAGNOSIS — M8580 Other specified disorders of bone density and structure, unspecified site: Secondary | ICD-10-CM | POA: Diagnosis not present

## 2018-01-03 DIAGNOSIS — E7800 Pure hypercholesterolemia, unspecified: Secondary | ICD-10-CM | POA: Diagnosis not present

## 2018-01-03 DIAGNOSIS — E249 Cushing's syndrome, unspecified: Secondary | ICD-10-CM | POA: Insufficient documentation

## 2018-01-03 DIAGNOSIS — C561 Malignant neoplasm of right ovary: Secondary | ICD-10-CM

## 2018-01-03 HISTORY — PX: IR FLUORO GUIDE PORT INSERTION RIGHT: IMG5741

## 2018-01-03 HISTORY — PX: IR US GUIDE VASC ACCESS RIGHT: IMG2390

## 2018-01-03 LAB — COMPREHENSIVE METABOLIC PANEL
ALBUMIN: 4.3 g/dL (ref 3.5–5.0)
ALK PHOS: 51 U/L (ref 38–126)
ALT: 20 U/L (ref 14–54)
ANION GAP: 11 (ref 5–15)
AST: 28 U/L (ref 15–41)
BUN: 22 mg/dL — ABNORMAL HIGH (ref 6–20)
CALCIUM: 9.5 mg/dL (ref 8.9–10.3)
CHLORIDE: 107 mmol/L (ref 101–111)
CO2: 23 mmol/L (ref 22–32)
Creatinine, Ser: 0.73 mg/dL (ref 0.44–1.00)
GFR calc Af Amer: >60 mL/min (ref >60)
GFR calc non Af Amer: >60 mL/min (ref >60)
GLUCOSE: 81 mg/dL (ref 65–99)
POTASSIUM: 4.3 mmol/L (ref 3.5–5.1)
SODIUM: 141 mmol/L (ref 135–145)
Total Bilirubin: 0.3 mg/dL (ref 0.3–1.2)
Total Protein: 7.7 g/dL (ref 6.5–8.1)

## 2018-01-03 LAB — CBC WITH DIFFERENTIAL/PLATELET
BASOS PCT: 1 %
Basophils Absolute: 0 10*3/uL (ref 0.0–0.1)
EOS ABS: 0.3 10*3/uL (ref 0.0–0.7)
Eosinophils Relative: 9 %
HCT: 44.4 % (ref 36.0–46.0)
Hemoglobin: 15.3 g/dL — ABNORMAL HIGH (ref 12.0–15.0)
Lymphocytes Relative: 32 %
Lymphs Abs: 1.1 10*3/uL (ref 0.7–4.0)
MCH: 31.7 pg (ref 26.0–34.0)
MCHC: 34.5 g/dL (ref 30.0–36.0)
MCV: 92.1 fL (ref 78.0–100.0)
MONOS PCT: 8 %
Monocytes Absolute: 0.3 10*3/uL (ref 0.1–1.0)
Neutro Abs: 1.8 10*3/uL (ref 1.7–7.7)
Neutrophils Relative %: 50 %
PLATELETS: 133 10*3/uL — AB (ref 150–400)
RBC: 4.82 MIL/uL (ref 3.87–5.11)
RDW: 13.1 % (ref 11.5–15.5)
WBC: 3.6 10*3/uL — AB (ref 4.0–10.5)

## 2018-01-03 LAB — PROTIME-INR
INR: 0.86
Prothrombin Time: 11.7 seconds (ref 11.4–15.2)

## 2018-01-03 MED ORDER — DIAZEPAM 5 MG PO TABS
10.0000 mg | ORAL_TABLET | Freq: Once | ORAL | Status: AC
  Administered 2018-01-03: 10 mg via ORAL

## 2018-01-03 MED ORDER — FENTANYL CITRATE (PF) 100 MCG/2ML IJ SOLN
INTRAMUSCULAR | Status: AC | PRN
  Administered 2018-01-03: 25 ug via INTRAVENOUS
  Administered 2018-01-03: 50 ug via INTRAVENOUS

## 2018-01-03 MED ORDER — MIDAZOLAM HCL 2 MG/2ML IJ SOLN
INTRAMUSCULAR | Status: AC | PRN
  Administered 2018-01-03: 0.5 mg via INTRAVENOUS
  Administered 2018-01-03: 1 mg via INTRAVENOUS

## 2018-01-03 MED ORDER — LIDOCAINE HCL 1 % IJ SOLN
INTRAMUSCULAR | Status: AC
  Filled 2018-01-03: qty 20

## 2018-01-03 MED ORDER — DIAZEPAM 5 MG PO TABS
ORAL_TABLET | ORAL | Status: AC
  Administered 2018-01-03: 10 mg via ORAL
  Filled 2018-01-03: qty 2

## 2018-01-03 MED ORDER — FENTANYL CITRATE (PF) 100 MCG/2ML IJ SOLN
INTRAMUSCULAR | Status: AC
  Filled 2018-01-03: qty 4

## 2018-01-03 MED ORDER — SODIUM CHLORIDE 0.9 % IV SOLN
INTRAVENOUS | Status: DC
  Administered 2018-01-03: 10:00:00 via INTRAVENOUS

## 2018-01-03 MED ORDER — HEPARIN SOD (PORK) LOCK FLUSH 100 UNIT/ML IV SOLN
INTRAVENOUS | Status: AC
  Filled 2018-01-03: qty 5

## 2018-01-03 MED ORDER — CEFAZOLIN SODIUM-DEXTROSE 2-4 GM/100ML-% IV SOLN
INTRAVENOUS | Status: AC
  Administered 2018-01-03: 2 g via INTRAVENOUS
  Filled 2018-01-03: qty 100

## 2018-01-03 MED ORDER — HEPARIN SOD (PORK) LOCK FLUSH 100 UNIT/ML IV SOLN
INTRAVENOUS | Status: AC | PRN
  Administered 2018-01-03: 500 [IU] via INTRAVENOUS

## 2018-01-03 MED ORDER — CEFAZOLIN SODIUM-DEXTROSE 2-4 GM/100ML-% IV SOLN
2.0000 g | INTRAVENOUS | Status: AC
  Administered 2018-01-03: 2 g via INTRAVENOUS

## 2018-01-03 MED ORDER — MIDAZOLAM HCL 2 MG/2ML IJ SOLN
INTRAMUSCULAR | Status: AC
  Filled 2018-01-03: qty 4

## 2018-01-03 NOTE — Telephone Encounter (Signed)
-----   Message from Heath Lark, MD sent at 01/03/2018  8:53 AM EST ----- Regarding: IR to draw CBC and CMP Can you remind IR to draw these labs during port placement? Thanks

## 2018-01-03 NOTE — Telephone Encounter (Signed)
IR will draw CBC and CMP

## 2018-01-03 NOTE — Procedures (Signed)
[place] PAC SVC RA EBL 0 Comp 0

## 2018-01-03 NOTE — Sedation Documentation (Signed)
Patient is resting comfortably. Pt awake. Medicated for port pocket formation

## 2018-01-03 NOTE — Sedation Documentation (Signed)
Patient is resting comfortably. MD suturing port pocket at this time. Pt resting with eyes closed in NAD. VSS. No s/s of pain/discomfort noted

## 2018-01-03 NOTE — Discharge Instructions (Signed)
You may remove dressing and bathe in 24 hours.  ° °Implanted Port Insertion, Care After °This sheet gives you information about how to care for yourself after your procedure. Your health care provider may also give you more specific instructions. If you have problems or questions, contact your health care provider. °What can I expect after the procedure? °After your procedure, it is common to have: °· Discomfort at the port insertion site. °· Bruising on the skin over the port. This should improve over 3-4 days. ° °Follow these instructions at home: °Port care °· After your port is placed, you will get a manufacturer's information card. The card has information about your port. Keep this card with you at all times. °· Take care of the port as told by your health care provider. Ask your health care provider if you or a family member can get training for taking care of the port at home. A home health care nurse may also take care of the port. °· Make sure to remember what type of port you have. °Incision care °· Follow instructions from your health care provider about how to take care of your port insertion site. Make sure you: °? Wash your hands with soap and water before you change your bandage (dressing). If soap and water are not available, use hand sanitizer. °? Change your dressing as told by your health care provider. °? Leave stitches (sutures), skin glue, or adhesive strips in place. These skin closures may need to stay in place for 2 weeks or longer. If adhesive strip edges start to loosen and curl up, you may trim the loose edges. Do not remove adhesive strips completely unless your health care provider tells you to do that. °· Check your port insertion site every day for signs of infection. Check for: °? More redness, swelling, or pain. °? More fluid or blood. °? Warmth. °? Pus or a bad smell. °General instructions °· Do not take baths, swim, or use a hot tub until your health care provider approves. °· Do  not lift anything that is heavier than 10 lb (4.5 kg) for a week, or as told by your health care provider. °· Ask your health care provider when it is okay to: °? Return to work or school. °? Resume usual physical activities or sports. °· Do not drive for 24 hours if you were given a medicine to help you relax (sedative). °· Take over-the-counter and prescription medicines only as told by your health care provider. °· Wear a medical alert bracelet in case of an emergency. This will tell any health care providers that you have a port. °· Keep all follow-up visits as told by your health care provider. This is important. °Contact a health care provider if: °· You cannot flush your port with saline as directed, or you cannot draw blood from the port. °· You have a fever or chills. °· You have more redness, swelling, or pain around your port insertion site. °· You have more fluid or blood coming from your port insertion site. °· Your port insertion site feels warm to the touch. °· You have pus or a bad smell coming from the port insertion site. °Get help right away if: °· You have chest pain or shortness of breath. °· You have bleeding from your port that you cannot control. °Summary °· Take care of the port as told by your health care provider. °· Change your dressing as told by your health care provider. °·   Keep all follow-up visits as told by your health care provider. °This information is not intended to replace advice given to you by your health care provider. Make sure you discuss any questions you have with your health care provider. °Document Released: 08/28/2013 Document Revised: 09/28/2016 Document Reviewed: 09/28/2016 °Elsevier Interactive Patient Education © 2017 [employer] ° ° ° °Moderate Conscious Sedation, Adult, Care After °These instructions provide you with information about caring for yourself after your procedure. Your health care provider may also give you more specific instructions. Your  treatment has been planned according to current medical practices, but problems sometimes occur. Call your health care provider if you have any problems or questions after your procedure. °What can I expect after the procedure? °After your procedure, it is common: °· To feel sleepy for several hours. °· To feel clumsy and have poor balance for several hours. °· To have poor judgment for several hours. °· To vomit if you eat too soon. ° °Follow these instructions at home: °For at least 24 hours after the procedure: ° °· Do not: °? Participate in activities where you could fall or become injured. °? Drive. °? Use heavy machinery. °? Drink alcohol. °? Take sleeping pills or medicines that cause drowsiness. °? Make important decisions or sign legal documents. °? Take care of children on your own. °· Rest. °Eating and drinking °· Follow the diet recommended by your health care provider. °· If you vomit: °? Drink water, juice, or soup when you can drink without vomiting. °? Make sure you have little or no nausea before eating solid foods. °General instructions °· Have a responsible adult stay with you until you are awake and alert. °· Take over-the-counter and prescription medicines only as told by your health care provider. °· If you smoke, do not smoke without supervision. °· Keep all follow-up visits as told by your health care provider. This is important. °Contact a health care provider if: °· You keep feeling nauseous or you keep vomiting. °· You feel light-headed. °· You develop a rash. °· You have a fever. °Get help right away if: °· You have trouble breathing. °This information is not intended to replace advice given to you by your health care provider. Make sure you discuss any questions you have with your health care provider. °Document Released: 08/28/2013 Document Revised: 04/11/2016 Document Reviewed: 02/27/2016 °Elsevier Interactive Patient Education © 2018 [employer] ° °

## 2018-01-03 NOTE — Sedation Documentation (Signed)
Patient is resting comfortably with eyes closed in NAD. No s/s of pain/discomfort noted 

## 2018-01-03 NOTE — Sedation Documentation (Signed)
Patient is resting comfortably. No s/s of pain/discomfort noted

## 2018-01-03 NOTE — H&P (Signed)
Referring Physician(s): Cross,Melissa D/Clarke- Pearson,D/Gorsuch,N  Supervising Physician: Marybelle Killings  Patient Status:  [facility]  Chief Complaint:  "I'm getting a port a cath"  Subjective: Patient familiar to IR service from prior Port-A-Cath placement in 2016 with removal in 2017.  She has a history of ovarian cancer diagnosed initially in 2016 with prior surgery and chemotherapy.  She now presents with recurrent disease and is scheduled today for Port-A-Cath placement for  palliative chemotherapy.  She currently denies fever, headache, chest pain, dyspnea, cough, abdominal/back pain, nausea, vomiting or bleeding. Past Medical History:  Diagnosis Date  . Cushing's disease ([place])   . Elevated cholesterol   . Osteopenia   . Ovarian cancer [facility])    Past Surgical History:  Procedure Laterality Date  . VAGINAL HYSTERECTOMY       Allergies: Patient has no known allergies.  Medications: Prior to Admission medications   Medication Sig Start Date End Date Taking? Authorizing Provider  atorvastatin (LIPITOR) 10 MG tablet Take 1 tablet (10 mg total) by mouth daily. 12/24/15  Yes Livesay, Lennis P, MD  Coenzyme Q10 (CO Q 10) 100 MG CAPS Take 1 capsule by mouth daily.    Yes [provider]  Glucosamine-Chondroit-Vit C-Mn (GLUCOSAMINE 1500 COMPLEX PO) Take 1 capsule by mouth 2 (two) times daily.   Yes [provider]  loratadine (CLARITIN) 10 MG tablet Take 10 mg by mouth daily as needed. Reported on 01/15/2016 07/23/15  Yes [provider]  Multiple Vitamin (MULTIVITAMIN) capsule Take 1 capsule by mouth daily.    Yes [provider]  Omega-3 Fatty Acids (OMEGA-3 FISH OIL) 300 MG CAPS Take 1 capsule by mouth daily.    Yes [provider]  Polyethyl Glycol-Propyl Glycol (SYSTANE ULTRA OP) Apply 1 drop to eye 2 (two) times daily at 10 AM and 5 PM.   Yes [provider]  vitamin E 400 UNIT capsule Take 400 Units by mouth daily.    Yes  [provider]  dexamethasone (DECADRON) 4 MG tablet Take 5 tabs at midnight and 5 tabs at 6 am the morning of chemotherapy, every 3 weeks 12/29/17   Heath Lark, MD  lidocaine-prilocaine (EMLA) cream Apply to affected area once 12/29/17   Heath Lark, MD  ondansetron (ZOFRAN) 8 MG tablet Take 1 tablet (8 mg total) by mouth 2 (two) times daily as needed for refractory nausea / vomiting. Start on day 3 after chemo. 12/29/17   Heath Lark, MD  prochlorperazine (COMPAZINE) 10 MG tablet Take 1 tablet (10 mg total) by mouth every 6 (six) hours as needed (Nausea or vomiting). 12/29/17   Heath Lark, MD     Vital Signs: BP (!) 181/79 (BP Location: Left Arm)   Pulse 75   Temp 98.3 F (36.8 C) (Oral)   Resp 18   SpO2 100%   Physical Exam awake, alert.  Chest clear to auscultation bilaterally.  Heart with regular rate and rhythm.  Abdomen soft, positive bowel sounds, nontender.  No lower extremity edema.  Imaging: No results found.  Labs:  CBC: No results for input(s): WBC, HGB, HCT, PLT in the last 8760 hours.  COAGS: No results for input(s): INR, APTT in the last 8760 hours.  BMP: Recent Labs    12/13/17 1114  NA 140  K 4.0  CL 104  CO2 25  GLUCOSE 78  BUN 17  CALCIUM 10.5*  CREATININE 0.80  GFRNONAA >60  GFRAA >60    LIVER FUNCTION TESTS: No results  for input(s): BILITOT, AST, ALT, ALKPHOS, PROT, ALBUMIN in the last 8760 hours.  Assessment and Plan: Patient with history of recurrent ovarian cancer; presents today for Port-A-Cath placement for palliative chemotherapy. Risks and benefits discussed with the patient including, but not limited to bleeding, infection, pneumothorax, or fibrin sheath development and need for additional procedures.  All of the patient's questions were answered, patient is agreeable to proceed. Consent signed and in chart.  Labs pending   Electronically Signed: D. Rowe Robert, PA-C 01/03/2018, 9:54 AM   I spent a total of 20 minutes at  the the patient's bedside AND on the patient's hospital floor or unit, greater than 50% of which was counseling/coordinating care for Port-A-Cath placement

## 2018-01-09 ENCOUNTER — Inpatient Hospital Stay: Payer: Medicare Other

## 2018-01-09 VITALS — BP 164/67 | HR 81 | Temp 97.9°F | Resp 18

## 2018-01-09 DIAGNOSIS — Z5112 Encounter for antineoplastic immunotherapy: Secondary | ICD-10-CM | POA: Diagnosis not present

## 2018-01-09 DIAGNOSIS — Z7189 Other specified counseling: Secondary | ICD-10-CM

## 2018-01-09 DIAGNOSIS — C561 Malignant neoplasm of right ovary: Secondary | ICD-10-CM

## 2018-01-09 LAB — UA PROTEIN, DIPSTICK - CHCC: Protein, ur: NEGATIVE mg/dL

## 2018-01-09 MED ORDER — DIPHENHYDRAMINE HCL 50 MG/ML IJ SOLN
INTRAMUSCULAR | Status: AC
  Filled 2018-01-09: qty 1

## 2018-01-09 MED ORDER — SODIUM CHLORIDE 0.9 % IV SOLN
175.0000 mg/m2 | Freq: Once | INTRAVENOUS | Status: AC
  Administered 2018-01-09: 300 mg via INTRAVENOUS
  Filled 2018-01-09: qty 50

## 2018-01-09 MED ORDER — PALONOSETRON HCL INJECTION 0.25 MG/5ML
0.2500 mg | Freq: Once | INTRAVENOUS | Status: AC
  Administered 2018-01-09: 0.25 mg via INTRAVENOUS

## 2018-01-09 MED ORDER — PEGFILGRASTIM 6 MG/0.6ML ~~LOC~~ PSKT
PREFILLED_SYRINGE | SUBCUTANEOUS | Status: AC
Start: 2018-01-09 — End: 2018-01-09
  Filled 2018-01-09: qty 0.6

## 2018-01-09 MED ORDER — PALONOSETRON HCL INJECTION 0.25 MG/5ML
INTRAVENOUS | Status: AC
Start: 2018-01-09 — End: 2018-01-09
  Filled 2018-01-09: qty 5

## 2018-01-09 MED ORDER — SODIUM CHLORIDE 0.9 % IV SOLN
Freq: Once | INTRAVENOUS | Status: AC
  Administered 2018-01-09: 09:00:00 via INTRAVENOUS

## 2018-01-09 MED ORDER — DIPHENHYDRAMINE HCL 50 MG/ML IJ SOLN
50.0000 mg | Freq: Once | INTRAMUSCULAR | Status: AC
  Administered 2018-01-09: 50 mg via INTRAVENOUS

## 2018-01-09 MED ORDER — SODIUM CHLORIDE 0.9 % IV SOLN
1000.0000 mg | Freq: Once | INTRAVENOUS | Status: AC
  Administered 2018-01-09: 1000 mg via INTRAVENOUS
  Filled 2018-01-09: qty 32

## 2018-01-09 MED ORDER — PEGFILGRASTIM 6 MG/0.6ML ~~LOC~~ PSKT
6.0000 mg | PREFILLED_SYRINGE | Freq: Once | SUBCUTANEOUS | Status: AC
  Administered 2018-01-09: 6 mg via SUBCUTANEOUS

## 2018-01-09 MED ORDER — SODIUM CHLORIDE 0.9 % IV SOLN
380.0000 mg | Freq: Once | INTRAVENOUS | Status: AC
  Administered 2018-01-09: 380 mg via INTRAVENOUS
  Filled 2018-01-09: qty 38

## 2018-01-09 MED ORDER — SODIUM CHLORIDE 0.9% FLUSH
10.0000 mL | INTRAVENOUS | Status: DC | PRN
  Administered 2018-01-09: 10 mL
  Filled 2018-01-09: qty 10

## 2018-01-09 MED ORDER — FAMOTIDINE IN NACL 20-0.9 MG/50ML-% IV SOLN: 20.0000 mg | Freq: Once | INTRAVENOUS | Status: DC

## 2018-01-09 MED ORDER — HEPARIN SOD (PORK) LOCK FLUSH 100 UNIT/ML IV SOLN
500.0000 [IU] | Freq: Once | INTRAVENOUS | Status: AC | PRN
  Administered 2018-01-09: 500 [IU]
  Filled 2018-01-09: qty 5

## 2018-01-09 MED ORDER — SODIUM CHLORIDE 0.9 % IV SOLN
20.0000 mg | Freq: Once | INTRAVENOUS | Status: AC
  Administered 2018-01-09: 20 mg via INTRAVENOUS
  Filled 2018-01-09: qty 2

## 2018-01-09 NOTE — Progress Notes (Signed)
Per Dr. Alvy Bimler, okay to tx without UA-Protein today. Will test prior to second tx.  Discussed elevated BP 163/70 with Sandi Mealy, PA at 1120. Okay to proceed and continue to monitor BP. Pt denied any pain, SOB, light-headedness, diaphoresis, or any other changes. Fifteen minutes post last titration, pt BP 161/74. Pt continues to deny any pain, SOB, light-headedness, diaphoresis, or any other changes.  Pt education discussed. Pt knows to check BP at home before bed and in the morning.

## 2018-01-09 NOTE — Patient Instructions (Signed)
[facility] Discharge Instructions for Patients Receiving Chemotherapy  Today you received the following chemotherapy agents paclitaxel (Taxol), carboplatin (Paraplatin), and bevacizumab (Avastin).  To help prevent nausea and vomiting after your treatment, we encourage you to take your nausea medication as directed by Dr. Alvy Bimler.   If you develop nausea and vomiting that is not controlled by your nausea medication, call the clinic.   BELOW ARE SYMPTOMS THAT SHOULD BE REPORTED IMMEDIATELY:  *FEVER GREATER THAN 100.5 F  *CHILLS WITH OR WITHOUT FEVER  NAUSEA AND VOMITING THAT IS NOT CONTROLLED WITH YOUR NAUSEA MEDICATION  *UNUSUAL SHORTNESS OF BREATH  *UNUSUAL BRUISING OR BLEEDING  TENDERNESS IN MOUTH AND THROAT WITH OR WITHOUT PRESENCE OF ULCERS  *URINARY PROBLEMS  *BOWEL PROBLEMS  UNUSUAL RASH Items with * indicate a potential emergency and should be followed up as soon as possible.  Feel free to call the clinic should you have any questions or concerns. The clinic phone number is [phone].  Please show the [place] at check-in to the Emergency Department and triage nurse.  Paclitaxel injection What is this medicine? PACLITAXEL (PAK li TAX el) is a chemotherapy drug. It targets fast dividing cells, like cancer cells, and causes these cells to die. This medicine is used to treat ovarian cancer, breast cancer, and other cancers. This medicine may be used for other purposes; ask your health care provider or pharmacist if you have questions. COMMON BRAND NAME(S): Onxol, Taxol What should I tell my health care provider before I take this medicine? They need to know if you have any of these conditions: -blood disorders -irregular heartbeat -infection (especially a virus infection such as chickenpox, cold sores, or herpes) -liver disease -previous or ongoing radiation therapy -an unusual or allergic reaction to paclitaxel, alcohol, polyoxyethylated  castor oil, other chemotherapy agents, other medicines, foods, dyes, or preservatives -pregnant or trying to get pregnant -breast-feeding How should I use this medicine? This drug is given as an infusion into a vein. It is administered in a hospital or clinic by a specially trained health care professional. Talk to your pediatrician regarding the use of this medicine in children. Special care may be needed. Overdosage: If you think you have taken too much of this medicine contact a poison control center or emergency room at once. NOTE: This medicine is only for you. Do not share this medicine with others. What if I miss a dose? It is important not to miss your dose. Call your doctor or health care professional if you are unable to keep an appointment. What may interact with this medicine? Do not take this medicine with any of the following medications: -disulfiram -metronidazole This medicine may also interact with the following medications: -cyclosporine -diazepam -ketoconazole -medicines to increase blood counts like filgrastim, pegfilgrastim, sargramostim -other chemotherapy drugs like cisplatin, doxorubicin, epirubicin, etoposide, teniposide, vincristine -quinidine -testosterone -vaccines -verapamil Talk to your doctor or health care professional before taking any of these medicines: -acetaminophen -aspirin -ibuprofen -ketoprofen -naproxen This list may not describe all possible interactions. Give your health care provider a list of all the medicines, herbs, non-prescription drugs, or dietary supplements you use. Also tell them if you smoke, drink alcohol, or use illegal drugs. Some items may interact with your medicine. What should I watch for while using this medicine? Your condition will be monitored carefully while you are receiving this medicine. You will need important blood work done while you are taking this medicine. This medicine can cause serious allergic reactions.  To  reduce your risk you will need to take other medicine(s) before treatment with this medicine. If you experience allergic reactions like skin rash, itching or hives, swelling of the face, lips, or tongue, tell your doctor or health care professional right away. In some cases, you may be given additional medicines to help with side effects. Follow all directions for their use. This drug may make you feel generally unwell. This is not uncommon, as chemotherapy can affect healthy cells as well as cancer cells. Report any side effects. Continue your course of treatment even though you feel ill unless your doctor tells you to stop. Call your doctor or health care professional for advice if you get a fever, chills or sore throat, or other symptoms of a cold or flu. Do not treat yourself. This drug decreases your body's ability to fight infections. Try to avoid being around people who are sick. This medicine may increase your risk to bruise or bleed. Call your doctor or health care professional if you notice any unusual bleeding. Be careful brushing and flossing your teeth or using a toothpick because you may get an infection or bleed more easily. If you have any dental work done, tell your dentist you are receiving this medicine. Avoid taking products that contain aspirin, acetaminophen, ibuprofen, naproxen, or ketoprofen unless instructed by your doctor. These medicines may hide a fever. Do not become pregnant while taking this medicine. Women should inform their doctor if they wish to become pregnant or think they might be pregnant. There is a potential for serious side effects to an unborn child. Talk to your health care professional or pharmacist for more information. Do not breast-feed an infant while taking this medicine. Men are advised not to father a child while receiving this medicine. This product may contain alcohol. Ask your pharmacist or healthcare provider if this medicine contains alcohol. Be sure  to tell all healthcare providers you are taking this medicine. Certain medicines, like metronidazole and disulfiram, can cause an unpleasant reaction when taken with alcohol. The reaction includes flushing, headache, nausea, vomiting, sweating, and increased thirst. The reaction can last from 30 minutes to several hours. What side effects may I notice from receiving this medicine? Side effects that you should report to your doctor or health care professional as soon as possible: -allergic reactions like skin rash, itching or hives, swelling of the face, lips, or tongue -low blood counts - This drug may decrease the number of white blood cells, red blood cells and platelets. You may be at increased risk for infections and bleeding. -signs of infection - fever or chills, cough, sore throat, pain or difficulty passing urine -signs of decreased platelets or bleeding - bruising, pinpoint red spots on the skin, black, tarry stools, nosebleeds -signs of decreased red blood cells - unusually weak or tired, fainting spells, lightheadedness -breathing problems -chest pain -high or low blood pressure -mouth sores -nausea and vomiting -pain, swelling, redness or irritation at the injection site -pain, tingling, numbness in the hands or feet -slow or irregular heartbeat -swelling of the ankle, feet, hands Side effects that usually do not require medical attention (report to your doctor or health care professional if they continue or are bothersome): -bone pain -complete hair loss including hair on your head, underarms, pubic hair, eyebrows, and eyelashes -changes in the color of fingernails -diarrhea -loosening of the fingernails -loss of appetite -muscle or joint pain -red flush to skin -sweating This list may not describe all possible side  effects. Call your doctor for medical advice about side effects. You may report side effects to FDA at 1-800-FDA-1088. Where should I keep my medicine? This drug  is given in a hospital or clinic and will not be stored at home. NOTE: This sheet is a summary. It may not cover all possible information. If you have questions about this medicine, talk to your doctor, pharmacist, or health care provider.  2018 Elsevier/Gold Standard (2015-09-08 19:58:00)  Carboplatin injection What is this medicine? CARBOPLATIN (KAR boe pla tin) is a chemotherapy drug. It targets fast dividing cells, like cancer cells, and causes these cells to die. This medicine is used to treat ovarian cancer and many other cancers. This medicine may be used for other purposes; ask your health care provider or pharmacist if you have questions. COMMON BRAND NAME(S): Paraplatin What should I tell my health care provider before I take this medicine? They need to know if you have any of these conditions: -blood disorders -hearing problems -kidney disease -recent or ongoing radiation therapy -an unusual or allergic reaction to carboplatin, cisplatin, other chemotherapy, other medicines, foods, dyes, or preservatives -pregnant or trying to get pregnant -breast-feeding How should I use this medicine? This drug is usually given as an infusion into a vein. It is administered in a hospital or clinic by a specially trained health care professional. Talk to your pediatrician regarding the use of this medicine in children. Special care may be needed. Overdosage: If you think you have taken too much of this medicine contact a poison control center or emergency room at once. NOTE: This medicine is only for you. Do not share this medicine with others. What if I miss a dose? It is important not to miss a dose. Call your doctor or health care professional if you are unable to keep an appointment. What may interact with this medicine? -medicines for seizures -medicines to increase blood counts like filgrastim, pegfilgrastim, sargramostim -some antibiotics like amikacin, gentamicin, neomycin,  streptomycin, tobramycin -vaccines Talk to your doctor or health care professional before taking any of these medicines: -acetaminophen -aspirin -ibuprofen -ketoprofen -naproxen This list may not describe all possible interactions. Give your health care provider a list of all the medicines, herbs, non-prescription drugs, or dietary supplements you use. Also tell them if you smoke, drink alcohol, or use illegal drugs. Some items may interact with your medicine. What should I watch for while using this medicine? Your condition will be monitored carefully while you are receiving this medicine. You will need important blood work done while you are taking this medicine. This drug may make you feel generally unwell. This is not uncommon, as chemotherapy can affect healthy cells as well as cancer cells. Report any side effects. Continue your course of treatment even though you feel ill unless your doctor tells you to stop. In some cases, you may be given additional medicines to help with side effects. Follow all directions for their use. Call your doctor or health care professional for advice if you get a fever, chills or sore throat, or other symptoms of a cold or flu. Do not treat yourself. This drug decreases your body's ability to fight infections. Try to avoid being around people who are sick. This medicine may increase your risk to bruise or bleed. Call your doctor or health care professional if you notice any unusual bleeding. Be careful brushing and flossing your teeth or using a toothpick because you may get an infection or bleed more easily. If you have  any dental work done, tell your dentist you are receiving this medicine. Avoid taking products that contain aspirin, acetaminophen, ibuprofen, naproxen, or ketoprofen unless instructed by your doctor. These medicines may hide a fever. Do not become pregnant while taking this medicine. Women should inform their doctor if they wish to become  pregnant or think they might be pregnant. There is a potential for serious side effects to an unborn child. Talk to your health care professional or pharmacist for more information. Do not breast-feed an infant while taking this medicine. What side effects may I notice from receiving this medicine? Side effects that you should report to your doctor or health care professional as soon as possible: -allergic reactions like skin rash, itching or hives, swelling of the face, lips, or tongue -signs of infection - fever or chills, cough, sore throat, pain or difficulty passing urine -signs of decreased platelets or bleeding - bruising, pinpoint red spots on the skin, black, tarry stools, nosebleeds -signs of decreased red blood cells - unusually weak or tired, fainting spells, lightheadedness -breathing problems -changes in hearing -changes in vision -chest pain -high blood pressure -low blood counts - This drug may decrease the number of white blood cells, red blood cells and platelets. You may be at increased risk for infections and bleeding. -nausea and vomiting -pain, swelling, redness or irritation at the injection site -pain, tingling, numbness in the hands or feet -problems with balance, talking, walking -trouble passing urine or change in the amount of urine Side effects that usually do not require medical attention (report to your doctor or health care professional if they continue or are bothersome): -hair loss -loss of appetite -metallic taste in the mouth or changes in taste This list may not describe all possible side effects. Call your doctor for medical advice about side effects. You may report side effects to FDA at 1-800-FDA-1088. Where should I keep my medicine? This drug is given in a hospital or clinic and will not be stored at home. NOTE: This sheet is a summary. It may not cover all possible information. If you have questions about this medicine, talk to your doctor,  pharmacist, or health care provider.  2018 Elsevier/Gold Standard (2008-02-12 14:38:05)  Bevacizumab injection What is this medicine? BEVACIZUMAB (be va SIZ yoo mab) is a monoclonal antibody. It is used to treat many types of cancer. This medicine may be used for other purposes; ask your health care provider or pharmacist if you have questions. COMMON BRAND NAME(S): Avastin What should I tell my health care provider before I take this medicine? They need to know if you have any of these conditions: -diabetes -heart disease -high blood pressure -history of coughing up blood -prior anthracycline chemotherapy (e.g., doxorubicin, daunorubicin, epirubicin) -recent or ongoing radiation therapy -recent or planning to have surgery -stroke -an unusual or allergic reaction to bevacizumab, hamster proteins, mouse proteins, other medicines, foods, dyes, or preservatives -pregnant or trying to get pregnant -breast-feeding How should I use this medicine? This medicine is for infusion into a vein. It is given by a health care professional in a hospital or clinic setting. Talk to your pediatrician regarding the use of this medicine in children. Special care may be needed. Overdosage: If you think you have taken too much of this medicine contact a poison control center or emergency room at once. NOTE: This medicine is only for you. Do not share this medicine with others. What if I miss a dose? It is important not to miss  your dose. Call your doctor or health care professional if you are unable to keep an appointment. What may interact with this medicine? Interactions are not expected. This list may not describe all possible interactions. Give your health care provider a list of all the medicines, herbs, non-prescription drugs, or dietary supplements you use. Also tell them if you smoke, drink alcohol, or use illegal drugs. Some items may interact with your medicine. What should I watch for while using  this medicine? Your condition will be monitored carefully while you are receiving this medicine. You will need important blood work and urine testing done while you are taking this medicine. This medicine may increase your risk to bruise or bleed. Call your doctor or health care professional if you notice any unusual bleeding. This medicine should be started at least 28 days following major surgery and the site of the surgery should be totally healed. Check with your doctor before scheduling dental work or surgery while you are receiving this treatment. Talk to your doctor if you have recently had surgery or if you have a wound that has not healed. Do not become pregnant while taking this medicine or for 6 months after stopping it. Women should inform their doctor if they wish to become pregnant or think they might be pregnant. There is a potential for serious side effects to an unborn child. Talk to your health care professional or pharmacist for more information. Do not breast-feed an infant while taking this medicine and for 6 months after the last dose. This medicine has caused ovarian failure in some women. This medicine may interfere with the ability to have a child. You should talk to your doctor or health care professional if you are concerned about your fertility. What side effects may I notice from receiving this medicine? Side effects that you should report to your doctor or health care professional as soon as possible: -allergic reactions like skin rash, itching or hives, swelling of the face, lips, or tongue -chest pain or chest tightness -chills -coughing up blood -high fever -seizures -severe constipation -signs and symptoms of bleeding such as bloody or black, tarry stools; red or dark-brown urine; spitting up blood or brown material that looks like coffee grounds; red spots on the skin; unusual bruising or bleeding from the eye, gums, or nose -signs and symptoms of a blood clot such  as breathing problems; chest pain; severe, sudden headache; pain, swelling, warmth in the leg -signs and symptoms of a stroke like changes in vision; confusion; trouble speaking or understanding; severe headaches; sudden numbness or weakness of the face, arm or leg; trouble walking; dizziness; loss of balance or coordination -stomach pain -sweating -swelling of legs or ankles -vomiting -weight gain Side effects that usually do not require medical attention (report to your doctor or health care professional if they continue or are bothersome): -back pain -changes in taste -decreased appetite -dry skin -nausea -tiredness This list may not describe all possible side effects. Call your doctor for medical advice about side effects. You may report side effects to FDA at 1-800-FDA-1088. Where should I keep my medicine? This drug is given in a hospital or clinic and will not be stored at home. NOTE: This sheet is a summary. It may not cover all possible information. If you have questions about this medicine, talk to your doctor, pharmacist, or health care provider.  2018 Elsevier/Gold Standard (2016-11-04 14:33:29)

## 2018-01-10 ENCOUNTER — Telehealth: Payer: Self-pay | Admitting: *Deleted

## 2018-01-10 NOTE — Telephone Encounter (Signed)
LM- calling for follow up on treatment. To call back if any questions or concerns

## 2018-01-10 NOTE — Telephone Encounter (Signed)
-----   Message from Johann Capers, RN sent at 01/09/2018  4:20 PM EST ----- Regarding: Alvy Bimler - Chemo Call Alvy Bimler - chemo f/u call. First time taxol/carbo/avastin.

## 2018-01-11 ENCOUNTER — Encounter: Payer: Self-pay | Admitting: Hematology and Oncology

## 2018-01-11 NOTE — Progress Notes (Signed)
Called pt on 01/03/18 to introduce myself as her Arboriculturist and to discuss copay assistance.  I left msg requesting she return my call at her earliest convenience.

## 2018-01-30 ENCOUNTER — Inpatient Hospital Stay: Payer: Medicare Other | Admitting: Medical

## 2018-01-30 ENCOUNTER — Inpatient Hospital Stay: Payer: Medicare Other

## 2018-01-30 ENCOUNTER — Telehealth: Payer: Self-pay | Admitting: Hematology and Oncology

## 2018-01-30 ENCOUNTER — Encounter: Payer: Self-pay | Admitting: Hematology and Oncology

## 2018-01-30 ENCOUNTER — Inpatient Hospital Stay: Payer: Medicare Other | Attending: Gynecology

## 2018-01-30 ENCOUNTER — Inpatient Hospital Stay (HOSPITAL_BASED_OUTPATIENT_CLINIC_OR_DEPARTMENT_OTHER): Payer: Medicare Other | Admitting: Hematology and Oncology

## 2018-01-30 VITALS — BP 150/58 | HR 91 | Resp 20

## 2018-01-30 DIAGNOSIS — C561 Malignant neoplasm of right ovary: Secondary | ICD-10-CM | POA: Insufficient documentation

## 2018-01-30 DIAGNOSIS — Z5189 Encounter for other specified aftercare: Secondary | ICD-10-CM | POA: Insufficient documentation

## 2018-01-30 DIAGNOSIS — Z5111 Encounter for antineoplastic chemotherapy: Secondary | ICD-10-CM | POA: Diagnosis present

## 2018-01-30 DIAGNOSIS — Z5112 Encounter for antineoplastic immunotherapy: Secondary | ICD-10-CM | POA: Insufficient documentation

## 2018-01-30 DIAGNOSIS — G620 Drug-induced polyneuropathy: Secondary | ICD-10-CM

## 2018-01-30 DIAGNOSIS — Z7189 Other specified counseling: Secondary | ICD-10-CM

## 2018-01-30 DIAGNOSIS — M898X9 Other specified disorders of bone, unspecified site: Secondary | ICD-10-CM | POA: Diagnosis not present

## 2018-01-30 DIAGNOSIS — T451X5A Adverse effect of antineoplastic and immunosuppressive drugs, initial encounter: Secondary | ICD-10-CM

## 2018-01-30 LAB — CMP (CANCER CENTER ONLY)
ALBUMIN: 4.1 g/dL (ref 3.5–5.0)
ALT: 18 U/L (ref 0–55)
AST: 17 U/L (ref 5–34)
Alkaline Phosphatase: 71 U/L (ref 40–150)
Anion gap: 10 (ref 3–11)
BUN: 15 mg/dL (ref 7–26)
CHLORIDE: 108 mmol/L (ref 98–109)
CO2: 22 mmol/L (ref 22–29)
Calcium: 10.1 mg/dL (ref 8.4–10.4)
Creatinine: 0.77 mg/dL (ref 0.60–1.10)
GFR, Est AFR Am: >60 mL/min (ref >60)
GFR, Estimated: >60 mL/min (ref >60)
GLUCOSE: 131 mg/dL (ref 70–140)
POTASSIUM: 4.2 mmol/L (ref 3.5–5.1)
SODIUM: 140 mmol/L (ref 136–145)
Total Bilirubin: 0.5 mg/dL (ref 0.2–1.2)
Total Protein: 7.6 g/dL (ref 6.4–8.3)

## 2018-01-30 LAB — CBC WITH DIFFERENTIAL (CANCER CENTER ONLY)
Basophils Absolute: 0 10*3/uL (ref 0.0–0.1)
Basophils Relative: 1 %
EOS PCT: 0 %
Eosinophils Absolute: 0 10*3/uL (ref 0.0–0.5)
HCT: 42.9 % (ref 34.8–46.6)
Hemoglobin: 14.3 g/dL (ref 11.6–15.9)
LYMPHS ABS: 0.5 10*3/uL — AB (ref 0.9–3.3)
LYMPHS PCT: 13 %
MCH: 30.3 pg (ref 25.1–34.0)
MCHC: 33.3 g/dL (ref 31.5–36.0)
MCV: 91 fL (ref 79.5–101.0)
MONOS PCT: 1 %
Monocytes Absolute: 0 10*3/uL — ABNORMAL LOW (ref 0.1–0.9)
Neutro Abs: 3.3 10*3/uL (ref 1.5–6.5)
Neutrophils Relative %: 85 %
PLATELETS: 172 10*3/uL (ref 145–400)
RBC: 4.72 MIL/uL (ref 3.70–5.45)
RDW: 14 % (ref 11.2–14.5)
WBC: 3.9 10*3/uL (ref 3.9–10.3)

## 2018-01-30 LAB — TOTAL PROTEIN, URINE DIPSTICK: Protein, ur: NEGATIVE mg/dL

## 2018-01-30 MED ORDER — SODIUM CHLORIDE 0.9 % IV SOLN
175.0000 mg/m2 | Freq: Once | INTRAVENOUS | Status: AC
  Administered 2018-01-30: 300 mg via INTRAVENOUS
  Filled 2018-01-30: qty 50

## 2018-01-30 MED ORDER — SODIUM CHLORIDE 0.9 % IV SOLN
Freq: Once | INTRAVENOUS | Status: AC
Start: 2018-01-30 — End: 2018-01-30
  Administered 2018-01-30: 10:00:00 via INTRAVENOUS

## 2018-01-30 MED ORDER — SODIUM CHLORIDE 0.9 % IV SOLN
Freq: Once | INTRAVENOUS | Status: AC
  Administered 2018-01-30: 11:00:00 via INTRAVENOUS

## 2018-01-30 MED ORDER — PALONOSETRON HCL INJECTION 0.25 MG/5ML
INTRAVENOUS | Status: AC
  Filled 2018-01-30: qty 5

## 2018-01-30 MED ORDER — PEGFILGRASTIM 6 MG/0.6ML ~~LOC~~ PSKT
PREFILLED_SYRINGE | SUBCUTANEOUS | Status: AC
  Filled 2018-01-30: qty 0.6

## 2018-01-30 MED ORDER — SODIUM CHLORIDE 0.9 % IV SOLN
20.0000 mg | Freq: Once | INTRAVENOUS | Status: AC
  Administered 2018-01-30: 20 mg via INTRAVENOUS
  Filled 2018-01-30: qty 2

## 2018-01-30 MED ORDER — HEPARIN SOD (PORK) LOCK FLUSH 100 UNIT/ML IV SOLN
500.0000 [IU] | Freq: Once | INTRAVENOUS | Status: AC | PRN
  Administered 2018-01-30: 500 [IU]
  Filled 2018-01-30: qty 5

## 2018-01-30 MED ORDER — SODIUM CHLORIDE 0.9% FLUSH
10.0000 mL | Freq: Once | INTRAVENOUS | Status: AC
  Administered 2018-01-30: 10 mL
  Filled 2018-01-30: qty 10

## 2018-01-30 MED ORDER — PALONOSETRON HCL INJECTION 0.25 MG/5ML
0.2500 mg | Freq: Once | INTRAVENOUS | Status: AC
  Administered 2018-01-30: 0.25 mg via INTRAVENOUS

## 2018-01-30 MED ORDER — BEVACIZUMAB CHEMO INJECTION 400 MG/16ML
14.8000 mg/kg | Freq: Once | INTRAVENOUS | Status: AC
  Administered 2018-01-30: 1000 mg via INTRAVENOUS
  Filled 2018-01-30: qty 8

## 2018-01-30 MED ORDER — FAMOTIDINE IN NACL 20-0.9 MG/50ML-% IV SOLN: 20.0000 mg | Freq: Once | INTRAVENOUS | Status: DC

## 2018-01-30 MED ORDER — DIPHENHYDRAMINE HCL 50 MG/ML IJ SOLN
INTRAMUSCULAR | Status: AC
  Filled 2018-01-30: qty 1

## 2018-01-30 MED ORDER — ALBUTEROL SULFATE (2.5 MG/3ML) 0.083% IN NEBU
2.5000 mg | INHALATION_SOLUTION | Freq: Once | RESPIRATORY_TRACT | Status: AC | PRN
  Administered 2018-01-30: 2.5 mg via RESPIRATORY_TRACT
  Filled 2018-01-30: qty 3

## 2018-01-30 MED ORDER — METHYLPREDNISOLONE SODIUM SUCC 125 MG IJ SOLR
125.0000 mg | Freq: Once | INTRAMUSCULAR | Status: AC | PRN
  Administered 2018-01-30: 125 mg via INTRAVENOUS

## 2018-01-30 MED ORDER — CARBOPLATIN CHEMO INJECTION 450 MG/45ML
380.0000 mg | Freq: Once | INTRAVENOUS | Status: AC
  Administered 2018-01-30: 380 mg via INTRAVENOUS
  Filled 2018-01-30: qty 38

## 2018-01-30 MED ORDER — DIPHENHYDRAMINE HCL 50 MG/ML IJ SOLN
50.0000 mg | Freq: Once | INTRAMUSCULAR | Status: AC
  Administered 2018-01-30: 50 mg via INTRAVENOUS

## 2018-01-30 MED ORDER — SODIUM CHLORIDE 0.9% FLUSH
10.0000 mL | INTRAVENOUS | Status: DC | PRN
  Administered 2018-01-30: 10 mL
  Filled 2018-01-30: qty 10

## 2018-01-30 MED ORDER — PEGFILGRASTIM 6 MG/0.6ML ~~LOC~~ PSKT
6.0000 mg | PREFILLED_SYRINGE | Freq: Once | SUBCUTANEOUS | Status: AC
  Administered 2018-01-30: 6 mg via SUBCUTANEOUS

## 2018-01-30 NOTE — Telephone Encounter (Signed)
Patient scheduled per 3/11 sch message. Patient will receive updated copy of schedule in treatment area.

## 2018-01-30 NOTE — Patient Instructions (Signed)
[facility] Discharge Instructions for Patients Receiving Chemotherapy  Today you received the following chemotherapy agents:  Taxol, Carboplatin  To help prevent nausea and vomiting after your treatment, we encourage you to take your nausea medication as prescribed.   If you develop nausea and vomiting that is not controlled by your nausea medication, call the clinic.   BELOW ARE SYMPTOMS THAT SHOULD BE REPORTED IMMEDIATELY:  *FEVER GREATER THAN 100.5 F  *CHILLS WITH OR WITHOUT FEVER  NAUSEA AND VOMITING THAT IS NOT CONTROLLED WITH YOUR NAUSEA MEDICATION  *UNUSUAL SHORTNESS OF BREATH  *UNUSUAL BRUISING OR BLEEDING  TENDERNESS IN MOUTH AND THROAT WITH OR WITHOUT PRESENCE OF ULCERS  *URINARY PROBLEMS  *BOWEL PROBLEMS  UNUSUAL RASH Items with * indicate a potential emergency and should be followed up as soon as possible.  Feel free to call the clinic should you have any questions or concerns. The clinic phone number is [phone].  Please show the CHEMO ALERT CARD at check-in to the Emergency Department and triage nurse.   

## 2018-01-30 NOTE — Assessment & Plan Note (Signed)
She had recent bone achiness due to G-CSF I recommend over-the-counter Tylenol as needed

## 2018-01-30 NOTE — Progress Notes (Signed)
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 It is only mild, not bothering the patient. I will observe for now If it gets worse in the future, I will consider modifying the dose of the treatment   Bone pain due to G-CSF She had recent bone achiness due to G-CSF I recommend over-the-counter Tylenol as needed   Orders Placed This Encounter  Procedures  . CT ABDOMEN PELVIS W CONTRAST    Standing Status:   Future    Standing Expiration Date:   01/31/2019    Order Specific Question:   If indicated for the ordered procedure, I authorize the administration of contrast media per Radiology protocol    Answer:   Yes    Order Specific Question:   Preferred imaging location?    Answer:   [facility]    Order Specific Question:   Radiology Contrast Protocol - do NOT remove file path    Answer:   [email or web address]    INTERVAL HISTORY: Please see below for problem oriented charting. She returns with her husband, Darnell Level, for further follow-up, to be seen prior to cycle 2 of chemotherapy She complained of mild intermittent tingling sensation affecting the tips of fingers but not the toes She had flushing for 2 days after treatment, resolved spontaneously She complained of mild fatigue She has some bone achiness after G-CSF injection, resolved with Tylenol She will also had mild constipation, resolved with laxatives She  denies nausea She has some gum sensitivity but no frank mucositis or vomiting.  SUMMARY OF ONCOLOGIC HISTORY: Oncology History   Neg genetics      Right ovarian epithelial cancer ([place])   07/01/2015 Initial Diagnosis    She presented with postmenopausal bleeding      07/02/2015 Imaging    US pelvis showed bilateral ovarian cyst      07/13/2015 Imaging    5.2 cm left adnexal cystic lesion, with indeterminate but probably benign characteristics. In a postmenopausal female, consider continued annual imaging followup with CT or MRI versus surgical evaluation.  Colonic diverticulosis. No radiographic evidence of diverticulitis.  Cholelithiasis.  No radiographic evidence of cholecystitis.  Small hiatal hernia.      07/30/2015 Pathology Results    Outside pathology showed right ovary with high-grade serous carcinoma, 2 cm in maximum dimension.  The tumor involves serosal surface of the right ovary and adjacent right fallopian tube.  Cervix and endometrium were within normal limits. ER positive Peritoneal washing was positive.      07/30/2015 Surgery    SHe underwent laparoscopic-assisted total vaginal hysterectomy, bilateral salpingo-oophorectomy      07/30/2015 Pathology Results    PERITONEAL WASHING PELVIC (SPECIMEN 1 OF 1, COLLECTED ON 07/30/2015): MALIGNANT CELLS CONSISTENT WITH HIGH GRADE CARCINOMA      08/13/2015 Tumor Marker    Patient's tumor was tested for the following markers: CA-125 Results of the tumor marker test revealed 96      08/25/2015 - 12/08/2015 Chemotherapy  The patient had 6 cycles of carboplatin and taxol      09/07/2015 Tumor Marker    Patient's tumor was tested for the following markers: CA-125 Results of the tumor marker test revealed 51      10/05/2015 Tumor Marker    Patient's tumor was tested for the following markers: CA-125 Results of the tumor marker test revealed 29      11/09/2015 Tumor Marker    Patient's tumor was tested for the  following markers: CA-125 Results of the tumor marker test revealed 44      12/08/2015 Tumor Marker    Patient's tumor was tested for the following markers: CA-125 Results of the tumor marker test revealed 30      12/15/2015 Genetic Testing    Genetics testing normal by [facility] Breast Ovarian panel 12-15-15      01/11/2016 Imaging    1. The only finding of note are several adjacent peripheral abnormal hypodensities along the anterior superior spleen margin, differential diagnostic considerations including small peripheral interval splenic infarcts, splenic injury with subcapsular a fluid collections, or less likely metastatic disease to the splenic margin. This likely merits observation. 2. Other imaging findings of potential clinical significance: Small type 1 hiatal hernia. Aortoiliac atherosclerotic vascular disease. Lower lumbar spondylosis and degenerative disc disease. Gallstones with potential mild gallbladder wall thickening.      03/07/2016 Tumor Marker    Patient's tumor was tested for the following markers: CA-125 Results of the tumor marker test revealed 24.2      04/06/2016 Imaging    1. No evidence of a ovarian cancer metastasis. 2. No ascites. 3. Post hysterectomy and oophorectomy. 4. Cholelithiasis with several large gallstones      04/06/2016 Tumor Marker    Patient's tumor was tested for the following markers: CA-125 Results of the tumor marker test revealed 22.8      05/30/2016 Tumor Marker    Patient's tumor was tested for the following markers: CA-125 Results of the tumor marker test revealed 21      09/07/2016 Tumor Marker    Patient's tumor was tested for the following markers: CA-125 Results of the tumor marker test revealed 22.4      11/28/2016 Tumor Marker    Patient's tumor was tested for the following markers: CA-125 Results of the tumor marker test revealed 18.8      02/23/2017 Tumor Marker    Patient's tumor was tested for the following markers:  CA-125 Results of the tumor marker test revealed 19.1      06/02/2017 Tumor Marker    Patient's tumor was tested for the following markers: CA-125 Results of the tumor marker test revealed 18.3      08/28/2017 Tumor Marker    Patient's tumor was tested for the following markers: CA-125 Results of the tumor marker test revealed 21.1      12/01/2017 Tumor Marker    Patient's tumor was tested for the following markers: CA-125 Results of the tumor marker test revealed 31.2      12/13/2017 Imaging    New 2.7 cm peritoneal soft tissue mass in anterior left lower quadrant, consistent with metastatic disease.  No other sites of metastatic disease identified.  Incidental findings including: Cholelithiasis. Colonic diverticulosis. Tiny hiatal hernia. Aortic atherosclerosis.       REVIEW OF SYSTEMS:   Constitutional: Denies fevers, chills or abnormal weight loss Eyes: Denies blurriness of vision Ears, nose, mouth, throat, and face: Denies mucositis or sore throat Respiratory: Denies cough, dyspnea  or wheezes Cardiovascular: Denies palpitation, chest discomfort or lower extremity swelling Skin: Denies abnormal skin rashes Lymphatics: Denies new lymphadenopathy or easy bruising Neurological:Denies numbness, tingling or new weaknesses Behavioral/Psych: Mood is stable, no new changes  All other systems were reviewed with the patient and are negative.  I have reviewed the past medical history, past surgical history, social history and family history with the patient and they are unchanged from previous note.  ALLERGIES:  has No Known Allergies.  MEDICATIONS:  Current Outpatient Medications  Medication Sig Dispense Refill  . atorvastatin (LIPITOR) 10 MG tablet Take 1 tablet (10 mg total) by mouth daily. 30 tablet 2  . Coenzyme Q10 (CO Q 10) 100 MG CAPS Take 1 capsule by mouth daily.     [name] dexamethasone (DECADRON) 4 MG tablet Take 5 tabs at midnight and 5 tabs at 6 am the morning of  chemotherapy, every 3 weeks 60 tablet 0  . Glucosamine-Chondroit-Vit C-Mn (GLUCOSAMINE 1500 COMPLEX PO) Take 1 capsule by mouth 2 (two) times daily.    [name] lidocaine-prilocaine (EMLA) cream Apply to affected area once 30 g 3  . loratadine (CLARITIN) 10 MG tablet Take 10 mg by mouth daily as needed. Reported on 01/15/2016    . Multiple Vitamin (MULTIVITAMIN) capsule Take 1 capsule by mouth daily.     . Omega-3 Fatty Acids (OMEGA-3 FISH OIL) 300 MG CAPS Take 1 capsule by mouth daily.     . ondansetron (ZOFRAN) 8 MG tablet Take 1 tablet (8 mg total) by mouth 2 (two) times daily as needed for refractory nausea / vomiting. Start on day 3 after chemo. 30 tablet 1  . Polyethyl Glycol-Propyl Glycol (SYSTANE ULTRA OP) Apply 1 drop to eye 2 (two) times daily at 10 AM and 5 PM.    . prochlorperazine (COMPAZINE) 10 MG tablet Take 1 tablet (10 mg total) by mouth every 6 (six) hours as needed (Nausea or vomiting). 60 tablet 1  . vitamin E 400 UNIT capsule Take 400 Units by mouth daily.      No current facility-administered medications for this visit.    Facility-Administered Medications Ordered in Other Visits  Medication Dose Route Frequency Provider Last Rate Last Dose  . 0.9 %  sodium chloride infusion   Intravenous Once Alvy Bimler, Shiori Adcox, MD      . bevacizumab (AVASTIN) 1,000 mg in sodium chloride 0.9 % 100 mL chemo infusion  14.8 mg/kg (Treatment Plan Recorded) Intravenous Once Anthoney Sheppard, MD      . CARBOplatin (PARAPLATIN) 380 mg in sodium chloride 0.9 % 250 mL chemo infusion  380 mg Intravenous Once Alvy Bimler, Lizbeth Feijoo, MD      . dexamethasone (DECADRON) 20 mg in sodium chloride 0.9 % 50 mL IVPB  20 mg Intravenous Once Alvy Bimler, Jaaliyah Lucatero, MD      . famotidine (PEPCID) 20 mg in sodium chloride 0.9 % 100 mL IVPB  20 mg Intravenous Once Alvy Bimler, Jamani Eley, MD      . heparin lock flush 100 unit/mL  500 Units Intracatheter Once PRN Alvy Bimler, Domnique Vantine, MD      . PACLitaxel (TAXOL) 300 mg in sodium chloride 0.9 % 250 mL chemo infusion (>  81m/m2)  175 mg/m2 (Treatment Plan Recorded) Intravenous Once Cheryal Salas, MD      . pegfilgrastim (NEULASTA ONPRO KIT) injection 6 mg  6 mg Subcutaneous Once Ginnifer Creelman, MD      . sodium chloride flush (NS) 0.9 % injection 10 mL  10 mL Intracatheter PRN GHeath Lark MD  PHYSICAL EXAMINATION: ECOG PERFORMANCE STATUS: 1 - Symptomatic but completely ambulatory  Vitals:   01/30/18 0846  BP: (!) 148/92  Pulse: 82  Resp: 18  Temp: 97.8 F (36.6 C)  SpO2: 99%   Filed Weights   01/30/18 0846  Weight: 147 lb 6.4 oz (66.9 kg)    GENERAL:alert, no distress and comfortable SKIN: skin color, texture, turgor are normal, no rashes or significant lesions EYES: normal, Conjunctiva are pink and non-injected, sclera clear OROPHARYNX:no exudate, no erythema and lips, buccal mucosa, and tongue normal  NECK: supple, thyroid normal size, non-tender, without nodularity LYMPH:  no palpable lymphadenopathy in the cervical, axillary or inguinal LUNGS: clear to auscultation and percussion with normal breathing effort HEART: regular rate & rhythm and no murmurs and no lower extremity edema ABDOMEN:abdomen soft, non-tender and normal bowel sounds Musculoskeletal:no cyanosis of digits and no clubbing  NEURO: alert & oriented x 3 with fluent speech, no focal motor/sensory deficits  LABORATORY DATA:  I have reviewed the data as listed    Component Value Date/Time   NA 140 01/30/2018 0814   NA 142 11/28/2016 0916   K 4.2 01/30/2018 0814   K 3.9 11/28/2016 0916   CL 108 01/30/2018 0814   CO2 22 01/30/2018 0814   CO2 28 11/28/2016 0916   GLUCOSE 131 01/30/2018 0814   GLUCOSE 78 11/28/2016 0916   BUN 15 01/30/2018 0814   BUN 16.8 11/28/2016 0916   CREATININE 0.77 01/30/2018 0814   CREATININE 0.8 11/28/2016 0916   CALCIUM 10.1 01/30/2018 0814   CALCIUM 10.5 (H) 11/28/2016 0916   PROT 7.6 01/30/2018 0814   PROT 7.3 11/28/2016 0916   ALBUMIN 4.1 01/30/2018 0814   ALBUMIN 4.3 11/28/2016 0916    AST 17 01/30/2018 0814   AST 19 11/28/2016 0916   ALT 18 01/30/2018 0814   ALT 19 11/28/2016 0916   ALKPHOS 71 01/30/2018 0814   ALKPHOS 50 11/28/2016 0916   BILITOT 0.5 01/30/2018 0814   BILITOT 0.48 11/28/2016 0916   GFRNONAA >60 01/30/2018 0814   GFRAA >60 01/30/2018 0814    No results found for: SPEP, UPEP  Lab Results  Component Value Date   WBC 3.9 01/30/2018   NEUTROABS 3.3 01/30/2018   HGB 15.3 (H) 01/03/2018   HCT 42.9 01/30/2018   MCV 91.0 01/30/2018   PLT 172 01/30/2018      Chemistry      Component Value Date/Time   NA 140 01/30/2018 0814   NA 142 11/28/2016 0916   K 4.2 01/30/2018 0814   K 3.9 11/28/2016 0916   CL 108 01/30/2018 0814   CO2 22 01/30/2018 0814   CO2 28 11/28/2016 0916   BUN 15 01/30/2018 0814   BUN 16.8 11/28/2016 0916   CREATININE 0.77 01/30/2018 0814   CREATININE 0.8 11/28/2016 0916      Component Value Date/Time   CALCIUM 10.1 01/30/2018 0814   CALCIUM 10.5 (H) 11/28/2016 0916   ALKPHOS 71 01/30/2018 0814   ALKPHOS 50 11/28/2016 0916   AST 17 01/30/2018 0814   AST 19 11/28/2016 0916   ALT 18 01/30/2018 0814   ALT 19 11/28/2016 0916   BILITOT 0.5 01/30/2018 0814   BILITOT 0.48 11/28/2016 0916       RADIOGRAPHIC STUDIES: I have personally reviewed the radiological images as listed and agreed with the findings in the report. Ir US Guide Vasc Access Right  Result Date: 01/03/2018 CLINICAL DATA:  Ovarian carcinoma EXAM: TUNNEL POWER PORT PLACEMENT WITH SUBCUTANEOUS POCKET  UTILIZING ULTRASOUND & FLOUROSCOPY FLUOROSCOPY TIME:  48 seconds.  Five mGy. MEDICATIONS AND MEDICAL HISTORY: Versed 1.5 mg, Fentanyl 75 mcg. Additional Medications: Ancef 2 g. Antibiotics were given within 2 hours of the procedure. ANESTHESIA/SEDATION: Moderate sedation time: 28 minutes. Nursing monitored the the patient during the procedure. PROCEDURE: After written informed consent was obtained, patient was placed in the supine position on angiographic table.  The right neck and chest was prepped and draped in a sterile fashion. Lidocaine was utilized for local anesthesia. The right jugular vein was noted to be patent initially with ultrasound. Under sonographic guidance, a micropuncture needle was inserted into the right IJ vein (Ultrasound and fluoroscopic image documentation was performed). The needle was removed over an 018 wire which was exchanged for a Amplatz. This was advanced into the IVC. An 8-French dilator was advanced over the Amplatz. A small incision was made in the right upper chest over the anterior right second rib. Utilizing blunt dissection, a subcutaneous pocket was created in the caudal direction. The pocket was irrigated with a copious amount of sterile normal saline. The port catheter was tunneled from the chest incision, and out the neck incision. The reservoir was inserted into the subcutaneous pocket and secured with two 3-0 Ethilon stitches. A peel-away sheath was advanced over the Amplatz wire. The port catheter was cut to measure length and inserted through the peel-away sheath. The peel-away sheath was removed. The chest incision was closed with 3-0 Vicryl interrupted stitches for the subcutaneous tissue and a running of 4-0 Vicryl subcuticular stitch for the skin. The neck incision was closed with a 4-0 Vicryl subcuticular stitch. Derma-bond was applied to both surgical incisions. The port reservoir was flushed and instilled with heparinized saline. No complications. FINDINGS: A right IJ vein Port-A-Cath is in place with its tip at the cavoatrial junction. COMPLICATIONS: None IMPRESSION: Successful 8 French right internal jugular vein power port placement with its tip at the SVC/RA junction. Electronically Signed   By: Marybelle Killings M.D.   On: 01/03/2018 12:40   Ir Fluoro Guide Port Insertion Right  Result Date: 01/03/2018 CLINICAL DATA:  Ovarian carcinoma EXAM: TUNNEL POWER PORT PLACEMENT WITH SUBCUTANEOUS POCKET UTILIZING ULTRASOUND &  FLOUROSCOPY FLUOROSCOPY TIME:  48 seconds.  Five mGy. MEDICATIONS AND MEDICAL HISTORY: Versed 1.5 mg, Fentanyl 75 mcg. Additional Medications: Ancef 2 g. Antibiotics were given within 2 hours of the procedure. ANESTHESIA/SEDATION: Moderate sedation time: 28 minutes. Nursing monitored the the patient during the procedure. PROCEDURE: After written informed consent was obtained, patient was placed in the supine position on angiographic table. The right neck and chest was prepped and draped in a sterile fashion. Lidocaine was utilized for local anesthesia. The right jugular vein was noted to be patent initially with ultrasound. Under sonographic guidance, a micropuncture needle was inserted into the right IJ vein (Ultrasound and fluoroscopic image documentation was performed). The needle was removed over an 018 wire which was exchanged for a Amplatz. This was advanced into the IVC. An 8-French dilator was advanced over the Amplatz. A small incision was made in the right upper chest over the anterior right second rib. Utilizing blunt dissection, a subcutaneous pocket was created in the caudal direction. The pocket was irrigated with a copious amount of sterile normal saline. The port catheter was tunneled from the chest incision, and out the neck incision. The reservoir was inserted into the subcutaneous pocket and secured with two 3-0 Ethilon stitches. A peel-away sheath was advanced over the Amplatz wire. The  port catheter was cut to measure length and inserted through the peel-away sheath. The peel-away sheath was removed. The chest incision was closed with 3-0 Vicryl interrupted stitches for the subcutaneous tissue and a running of 4-0 Vicryl subcuticular stitch for the skin. The neck incision was closed with a 4-0 Vicryl subcuticular stitch. Derma-bond was applied to both surgical incisions. The port reservoir was flushed and instilled with heparinized saline. No complications. FINDINGS: A right IJ vein Port-A-Cath  is in place with its tip at the cavoatrial junction. COMPLICATIONS: None IMPRESSION: Successful 8 French right internal jugular vein power port placement with its tip at the SVC/RA junction. Electronically Signed   By: Marybelle Killings M.D.   On: 01/03/2018 12:40    All questions were answered. The patient knows to call the clinic with any problems, questions or concerns. No barriers to learning was detected.  I spent 15 minutes counseling the patient face to face. The total time spent in the appointment was 20 minutes and more than 50% was on counseling and review of test results  Heath Lark, MD 01/30/2018 9:39 AM

## 2018-01-30 NOTE — Assessment & Plan Note (Signed)
So far, she tolerated treatment very well without any major side effects Tumor marker is pending I will proceed to schedule cycle 3 and cycle 4 and plan on CT scan to be done around April 17 to assess response to treatment We will proceed with treatment without dose adjustment

## 2018-01-30 NOTE — Assessment & Plan Note (Signed)
she has mild peripheral neuropathy, likely related to side effects of treatment. It is only mild, not bothering the patient. I will observe for now If it gets worse in the future, I will consider modifying the dose of the treatment  

## 2018-01-30 NOTE — Progress Notes (Signed)
At 1532 patient stated she was not feeling well. Patient looked flushed and was breathing heavier than normal. Carboplatin was stopped. Oxygen 2L [place] was applied. Fluids started.  Van Tanner, PA called. He came over and assessed the situation. Blood pressure was 73/46. He stated to give solumedrol 125. Times on MAR. Patient oxygen level was 84%. Oxygen increased to 4L o2. Patient wheezing and Van, PA also instructed to give nebulizer. After nebulizer patient began breathing better. Vitals charted on flow sheets. Van went to speak with MD. Patient went to restroom. Per Dr. Gorsuch carboplatin will be d/c and we will not continue it today. Vitals now stable. Patient received a total of 250ml Normal Saline. She is having chills most likely due to the steroids and fluids but she is feeling much better she states. Husband is here. Van Tanner came to explain the situation to spouse. She is well to discharge now.  Naol Ontiveros N Latika Kronick RN  

## 2018-01-30 NOTE — Progress Notes (Signed)
[facility] Progress Note   Daniel Johndrow [id number] 1941/04/27 77 y.o.  Onyx Edgley is managed by Dr. Tracie Harrier presents for:  Chemotherapy:  yes        Monoclonal Antibody: yes       Immunoglobulin: no       Bisphosphonate: no        Transfusion:  no     Current Therapy: Carboplatin, paclitaxel, and bevacizumab  Siddhi Dornbush was receiving carboplatin at the time of her reaction.  First dose of carboplatin: no  Last Treated: 01/09/2018  Assessment: Plan:    Infusion reaction, initial encounter  Tya Haughey was seen in the infusion room for a suspected infusion reaction. She was receiving carboplatin at the time of her reaction. She had received a total of one half of her total dose (approximately 190 mg) prior to onset of symptoms. Her symptoms included: Flushing, hypoxia, hypotension, mild chest tightness and a cough. She was premedicated with Decadron 20 mg IV, Aloxi 0.25 mg, Benadryl and Pepcid prior to starting her infusion. Carboplatin was paused and Zhavia Cunanan was given Solu-Medrol 125 mg IV and an albuterol nebulizer treatment along with supplemental oxygen after onset of her symptoms. Viann Fish did  respond to intervention.  Her hypoxia and hypotension responded to therapy.  She continued to have some flushing of her face. This case was discussed with Dr. Heath Lark, who directed that the following be done: The patient will no longer receive carboplatin.  Please see After Visit Summary for patient specific instructions.  Future Appointments  Date Time Provider [place]  02/20/2018  8:45 AM [facility] LAB 5 [facility] None  02/20/2018  9:00 AM [facility] J32 DNS [facility] None  02/20/2018  9:30 AM Heath Lark, MD [facility] None  02/20/2018 10:15 AM [facility] E14 [facility] None  03/08/2018  1:00 PM Gorsuch, Ni, MD [facility] None  03/13/2018  8:00 AM [facility] LAB 6 [facility] None    03/13/2018  8:15 AM [facility] FLUSH NURSE [facility] None  03/13/2018  9:15 AM [facility] D12 [facility] None    No orders of the defined types were placed in this encounter.      Subjective:   Patient ID:  Mozetta Murfin is a 77 y.o. (DOB 01/19/1941) female.  Chief Complaint: No chief complaint on file.   HPI Kanaya Gunnarson was seen in the infusion room for a suspected infusion reaction. She was receiving carboplatin at the time of her reaction. She had received a total of one half of her total dose (approximately 190 mg) prior to onset of symptoms. Her symptoms included: Flushing, hypoxia, hypotension, mild chest tightness and a cough. She was premedicated with Decadron 20 mg IV, Aloxi 0.25 mg, Benadryl and Pepcid prior to starting her infusion. Carboplatin was paused and Naturi Alarid was given Solu-Medrol 125 mg IV and an albuterol nebulizer treatment along with supplemental oxygen after onset of her symptoms. Viann Fish did  respond to intervention.  Her hypoxia and hypotension responded to therapy.  She continued to have some flushing of her face. This case was discussed with Dr. Heath Lark, who directed that the following be done: The patient will no longer receive carboplatin.  Medications: I have reviewed the patient's current medications.  Allergies: No Known Allergies  Past Medical History:  Diagnosis Date  . Cushing's disease ([place])   . Elevated cholesterol   . Osteopenia   . Ovarian cancer [facility])     Past Surgical History:  Procedure Laterality Date  .  IR FLUORO GUIDE PORT INSERTION RIGHT  01/03/2018  . IR US GUIDE VASC ACCESS RIGHT  01/03/2018  . VAGINAL HYSTERECTOMY      Family History  Problem Relation Age of Onset  . Parkinson's disease Mother 35       w/ dementia  . Heart attack Father        hardening of arteries  . Other Sister 31       hysterectomy due to heavy bleeding; still has ovaries  . Cancer Maternal Grandmother        maybe liver  cancer; dx. at least in 40s-50s  . Heart attack Paternal Grandfather 75    Social History   Socioeconomic History  . Marital status: Married    Spouse name: Not on file  . Number of children: 2  . Years of education: Not on file  . Highest education level: Not on file  Social Needs  . Financial resource strain: Not on file  . Food insecurity - worry: Not on file  . Food insecurity - inability: Not on file  . Transportation needs - medical: Not on file  . Transportation needs - non-medical: Not on file  Occupational History  . Occupation: retired Pharmacist, hospital  Tobacco Use  . Smoking status: Former Smoker    Packs/day: 0.25    Years: 2.00    Pack years: 0.50    Types: Cigarettes    Last attempt to quit: 11/21/1972    Years since quitting: 45.2  . Smokeless tobacco: Never Used  Substance and Sexual Activity  . Alcohol use: No  . Drug use: No  . Sexual activity: Not on file  Other Topics Concern  . Not on file  Social History Narrative  . Not on file    Past Medical History, Surgical history, Social history, and Family history were reviewed and updated as appropriate.   Please see review of systems for further details on the patient's review from today.   Review of Systems:  Review of Systems  Constitutional: Negative for chills, diaphoresis and fever.  HENT: Negative for trouble swallowing.   Respiratory: Positive for cough, chest tightness and shortness of breath. Negative for choking and wheezing.   Cardiovascular: Negative for chest pain and palpitations.  Skin: Positive for color change (Flushing of the face).    Objective:   Physical Exam:  There were no vitals taken for this visit.  Physical Exam  Constitutional: No distress.  HENT:  Head: Normocephalic and atraumatic.  Eyes: Right eye exhibits no discharge. Left eye exhibits no discharge. No scleral icterus.  Cardiovascular: Normal rate, regular rhythm and normal heart sounds. Exam reveals no gallop and no  friction rub.  No murmur heard. Pulmonary/Chest: Effort normal. No respiratory distress. She has wheezes (Subtle bibasilar inspiratory wheezes). She has no rales.  Neurological: She is alert.  Skin: Skin is warm and dry. She is not diaphoretic.  Flushing of the face and neck was noted.   Pre-albuterol exam: See above physical exam  Post-albuterol exam: The patient's bibasilar wheezes resolved after an albuterol nebulizer treatment.  Lab Review:     Component Value Date/Time   NA 140 01/30/2018 0814   NA 142 11/28/2016 0916   K 4.2 01/30/2018 0814   K 3.9 11/28/2016 0916   CL 108 01/30/2018 0814   CO2 22 01/30/2018 0814   CO2 28 11/28/2016 0916   GLUCOSE 131 01/30/2018 0814   GLUCOSE 78 11/28/2016 0916   BUN 15 01/30/2018 0814   BUN  16.8 11/28/2016 0916   CREATININE 0.77 01/30/2018 0814   CREATININE 0.8 11/28/2016 0916   CALCIUM 10.1 01/30/2018 0814   CALCIUM 10.5 (H) 11/28/2016 0916   PROT 7.6 01/30/2018 0814   PROT 7.3 11/28/2016 0916   ALBUMIN 4.1 01/30/2018 0814   ALBUMIN 4.3 11/28/2016 0916   AST 17 01/30/2018 0814   AST 19 11/28/2016 0916   ALT 18 01/30/2018 0814   ALT 19 11/28/2016 0916   ALKPHOS 71 01/30/2018 0814   ALKPHOS 50 11/28/2016 0916   BILITOT 0.5 01/30/2018 0814   BILITOT 0.48 11/28/2016 0916   GFRNONAA >60 01/30/2018 0814   GFRAA >60 01/30/2018 0814       Component Value Date/Time   WBC 3.9 01/30/2018 0814   WBC 3.6 (L) 01/03/2018 0938   RBC 4.72 01/30/2018 0814   HGB 15.3 (H) 01/03/2018 0938   HGB 14.1 11/28/2016 0916   HCT 42.9 01/30/2018 0814   HCT 42.2 11/28/2016 0916   PLT 172 01/30/2018 0814   PLT 141 (L) 11/28/2016 0916   MCV 91.0 01/30/2018 0814   MCV 91.9 11/28/2016 0916   MCH 30.3 01/30/2018 0814   MCHC 33.3 01/30/2018 0814   RDW 14.0 01/30/2018 0814   RDW 13.5 11/28/2016 0916   LYMPHSABS 0.5 (L) 01/30/2018 0814   LYMPHSABS 1.0 11/28/2016 0916   MONOABS 0.0 (L) 01/30/2018 0814   MONOABS 0.4 11/28/2016 0916   EOSABS 0.0  01/30/2018 0814   EOSABS 0.1 11/28/2016 0916   BASOSABS 0.0 01/30/2018 0814   BASOSABS 0.0 11/28/2016 0916   -------------------------------  Imaging from last 24 hours (if applicable):  Radiology interpretation: Ir US Guide Vasc Access Right  Result Date: 01/03/2018 CLINICAL DATA:  Ovarian carcinoma EXAM: TUNNEL POWER PORT PLACEMENT WITH SUBCUTANEOUS POCKET UTILIZING ULTRASOUND & FLOUROSCOPY FLUOROSCOPY TIME:  48 seconds.  Five mGy. MEDICATIONS AND MEDICAL HISTORY: Versed 1.5 mg, Fentanyl 75 mcg. Additional Medications: Ancef 2 g. Antibiotics were given within 2 hours of the procedure. ANESTHESIA/SEDATION: Moderate sedation time: 28 minutes. Nursing monitored the the patient during the procedure. PROCEDURE: After written informed consent was obtained, patient was placed in the supine position on angiographic table. The right neck and chest was prepped and draped in a sterile fashion. Lidocaine was utilized for local anesthesia. The right jugular vein was noted to be patent initially with ultrasound. Under sonographic guidance, a micropuncture needle was inserted into the right IJ vein (Ultrasound and fluoroscopic image documentation was performed). The needle was removed over an 018 wire which was exchanged for a Amplatz. This was advanced into the IVC. An 8-French dilator was advanced over the Amplatz. A small incision was made in the right upper chest over the anterior right second rib. Utilizing blunt dissection, a subcutaneous pocket was created in the caudal direction. The pocket was irrigated with a copious amount of sterile normal saline. The port catheter was tunneled from the chest incision, and out the neck incision. The reservoir was inserted into the subcutaneous pocket and secured with two 3-0 Ethilon stitches. A peel-away sheath was advanced over the Amplatz wire. The port catheter was cut to measure length and inserted through the peel-away sheath. The peel-away sheath was removed. The  chest incision was closed with 3-0 Vicryl interrupted stitches for the subcutaneous tissue and a running of 4-0 Vicryl subcuticular stitch for the skin. The neck incision was closed with a 4-0 Vicryl subcuticular stitch. Derma-bond was applied to both surgical incisions. The port reservoir was flushed and instilled with heparinized saline. No  complications. FINDINGS: A right IJ vein Port-A-Cath is in place with its tip at the cavoatrial junction. COMPLICATIONS: None IMPRESSION: Successful 8 French right internal jugular vein power port placement with its tip at the SVC/RA junction. Electronically Signed   By: Marybelle Killings M.D.   On: 01/03/2018 12:40   Ir Fluoro Guide Port Insertion Right  Result Date: 01/03/2018 CLINICAL DATA:  Ovarian carcinoma EXAM: TUNNEL POWER PORT PLACEMENT WITH SUBCUTANEOUS POCKET UTILIZING ULTRASOUND & FLOUROSCOPY FLUOROSCOPY TIME:  48 seconds.  Five mGy. MEDICATIONS AND MEDICAL HISTORY: Versed 1.5 mg, Fentanyl 75 mcg. Additional Medications: Ancef 2 g. Antibiotics were given within 2 hours of the procedure. ANESTHESIA/SEDATION: Moderate sedation time: 28 minutes. Nursing monitored the the patient during the procedure. PROCEDURE: After written informed consent was obtained, patient was placed in the supine position on angiographic table. The right neck and chest was prepped and draped in a sterile fashion. Lidocaine was utilized for local anesthesia. The right jugular vein was noted to be patent initially with ultrasound. Under sonographic guidance, a micropuncture needle was inserted into the right IJ vein (Ultrasound and fluoroscopic image documentation was performed). The needle was removed over an 018 wire which was exchanged for a Amplatz. This was advanced into the IVC. An 8-French dilator was advanced over the Amplatz. A small incision was made in the right upper chest over the anterior right second rib. Utilizing blunt dissection, a subcutaneous pocket was created in the caudal  direction. The pocket was irrigated with a copious amount of sterile normal saline. The port catheter was tunneled from the chest incision, and out the neck incision. The reservoir was inserted into the subcutaneous pocket and secured with two 3-0 Ethilon stitches. A peel-away sheath was advanced over the Amplatz wire. The port catheter was cut to measure length and inserted through the peel-away sheath. The peel-away sheath was removed. The chest incision was closed with 3-0 Vicryl interrupted stitches for the subcutaneous tissue and a running of 4-0 Vicryl subcuticular stitch for the skin. The neck incision was closed with a 4-0 Vicryl subcuticular stitch. Derma-bond was applied to both surgical incisions. The port reservoir was flushed and instilled with heparinized saline. No complications. FINDINGS: A right IJ vein Port-A-Cath is in place with its tip at the cavoatrial junction. COMPLICATIONS: None IMPRESSION: Successful 8 French right internal jugular vein power port placement with its tip at the SVC/RA junction. Electronically Signed   By: Marybelle Killings M.D.   On: 01/03/2018 12:40        This case was discussed with Dr. Alvy Bimler. She expresses agreement with my management of this patient.

## 2018-01-31 ENCOUNTER — Telehealth: Payer: Self-pay | Admitting: *Deleted

## 2018-01-31 LAB — CA 125: Cancer Antigen (CA) 125: 30 U/mL (ref 0.0–38.1)

## 2018-01-31 NOTE — Telephone Encounter (Signed)
States she is doing well today. No problems. Wants to know if this will be discussed with  Dr Fermin Schwab.   Would like to be called by the nurse when Dr Alvy Bimler knows how she is going to proceed with future chemo- does not need a lot of details in the call. Knows she will discuss with Dr Alvy Bimler in April.

## 2018-01-31 NOTE — Telephone Encounter (Signed)
-----   Message from Heath Lark, MD sent at 01/31/2018  9:58 AM EDT ----- Regarding: reaction to carboplatin  Can you call and check on her? She had [place] reaction yesterday

## 2018-02-01 ENCOUNTER — Telehealth: Payer: Self-pay | Admitting: Gynecologic Oncology

## 2018-02-01 NOTE — Telephone Encounter (Signed)
I have cancelled all her followup appt in [facility]

## 2018-02-01 NOTE — Telephone Encounter (Signed)
Called patient to discuss Dr. Mora Bellman recommendations.  Patient had an allergic reaction to last carboplatin dose (cycle 2) at [facility] in [place].  Dr. Fermin Schwab was informed of the situation and recommends the patient continuing with carboplatin.  He stated he could treat her at [facility] with a 24 hour admission for [place] desensitization.  Discussed with the patient.  Advised her that all her chemo-related appointments in [place] would be cancelled if she decided to continue with carboplatin with desensitization at [facility] under the care of Dr. Fermin Schwab.  She states she will do whatever he recommends and she will "work it out" to get to [name].  Dr. Fermin Schwab and Shelby Dubin, RN notified of the patient's decision along with Dr. Alvy Bimler.  Patient asking about the desensitization process and advised that that information would be given by [facility].  No concerns voiced.  Advised she would be contacted with additional information about transferring treatment to [facility].  Verbalizing understanding.  Advised to call for any needs or concerns.

## 2018-02-01 NOTE — Telephone Encounter (Signed)
Melissa,  I need your help to get her in to be seen by Dr. Wayland Salinas. She has carboplatin allergy. Infusion was stopped due to this. I plan on Taxol and Avastin only but since she has excellent rapport with Dr. Wayland Salinas, I hope he can weigh in on this before we proceed with future chemo  Thanks

## 2018-02-07 ENCOUNTER — Telehealth: Payer: Self-pay | Admitting: *Deleted

## 2018-02-07 NOTE — Telephone Encounter (Signed)
Returned the patient's call and cancelled the appt on March 25th per her request. Patient to see Dr. Fermin Schwab at [facility]

## 2018-02-12 ENCOUNTER — Ambulatory Visit: Payer: Medicare Other | Admitting: Gynecology

## 2018-02-20 ENCOUNTER — Other Ambulatory Visit: Payer: Medicare Other

## 2018-02-20 ENCOUNTER — Ambulatory Visit: Payer: Medicare Other

## 2018-02-20 ENCOUNTER — Ambulatory Visit: Payer: Medicare Other | Admitting: Hematology and Oncology

## 2018-02-26 ENCOUNTER — Other Ambulatory Visit: Payer: Self-pay | Admitting: Gynecologic Oncology

## 2018-02-26 DIAGNOSIS — C561 Malignant neoplasm of right ovary: Secondary | ICD-10-CM

## 2018-02-27 ENCOUNTER — Telehealth: Payer: Self-pay | Admitting: *Deleted

## 2018-02-27 NOTE — Telephone Encounter (Signed)
Called and spoke with the patient, gave her the appts for her weekly labs

## 2018-03-07 ENCOUNTER — Inpatient Hospital Stay: Payer: Medicare Other | Attending: Gynecology

## 2018-03-07 ENCOUNTER — Ambulatory Visit (HOSPITAL_COMMUNITY): Payer: Medicare Other

## 2018-03-07 DIAGNOSIS — C561 Malignant neoplasm of right ovary: Secondary | ICD-10-CM | POA: Diagnosis present

## 2018-03-07 LAB — COMPREHENSIVE METABOLIC PANEL
ALBUMIN: 3.7 g/dL (ref 3.5–5.0)
ALT: 13 U/L (ref 0–55)
AST: 18 U/L (ref 5–34)
Alkaline Phosphatase: 57 U/L (ref 40–150)
Anion gap: 5 (ref 3–11)
BUN: 24 mg/dL (ref 7–26)
CHLORIDE: 107 mmol/L (ref 98–109)
CO2: 27 mmol/L (ref 22–29)
Calcium: 9.5 mg/dL (ref 8.4–10.4)
Creatinine, Ser: 1.07 mg/dL (ref 0.60–1.10)
GFR calc Af Amer: 57 mL/min — ABNORMAL LOW (ref >60)
GFR calc non Af Amer: 49 mL/min — ABNORMAL LOW (ref >60)
Glucose, Bld: 74 mg/dL (ref 70–140)
POTASSIUM: 4.2 mmol/L (ref 3.5–5.1)
Sodium: 139 mmol/L (ref 136–145)
Total Bilirubin: 0.4 mg/dL (ref 0.2–1.2)
Total Protein: 7.1 g/dL (ref 6.4–8.3)

## 2018-03-07 LAB — CBC WITH DIFFERENTIAL (CANCER CENTER ONLY)
BASOS ABS: 0 10*3/uL (ref 0.0–0.1)
BASOS PCT: 1 %
Eosinophils Absolute: 0 10*3/uL (ref 0.0–0.5)
Eosinophils Relative: 3 %
HEMATOCRIT: 35.7 % (ref 34.8–46.6)
Hemoglobin: 12.1 g/dL (ref 11.6–15.9)
Lymphocytes Relative: 45 %
Lymphs Abs: 0.6 10*3/uL — ABNORMAL LOW (ref 0.9–3.3)
MCH: 31.1 pg (ref 25.1–34.0)
MCHC: 33.9 g/dL (ref 31.5–36.0)
MCV: 91.5 fL (ref 79.5–101.0)
MONO ABS: 0.4 10*3/uL (ref 0.1–0.9)
Monocytes Relative: 28 %
NEUTROS ABS: 0.3 10*3/uL — AB (ref 1.5–6.5)
Neutrophils Relative %: 23 %
Platelet Count: 89 10*3/uL — ABNORMAL LOW (ref 145–400)
RBC: 3.91 MIL/uL (ref 3.70–5.45)
RDW: 14.8 % — AB (ref 11.2–14.5)
WBC: 1.4 10*3/uL — AB (ref 3.9–10.3)

## 2018-03-07 LAB — MAGNESIUM: Magnesium: 1.8 mg/dL (ref 1.7–2.4)

## 2018-03-08 ENCOUNTER — Ambulatory Visit: Payer: Medicare Other | Admitting: Hematology and Oncology

## 2018-03-12 ENCOUNTER — Other Ambulatory Visit: Payer: Self-pay | Admitting: Gynecologic Oncology

## 2018-03-12 ENCOUNTER — Telehealth: Payer: Self-pay | Admitting: *Deleted

## 2018-03-12 DIAGNOSIS — C561 Malignant neoplasm of right ovary: Secondary | ICD-10-CM

## 2018-03-12 NOTE — Progress Notes (Signed)
Labs per [facility]. 

## 2018-03-12 NOTE — Telephone Encounter (Signed)
Patient called in to see what day this week  her lab appointment was scheduled for. Told patient that her lab  Appointment is scheduled for Thursday, April 25th, 2019 at 1:15p.m. Patient verbalized understanding.

## 2018-03-13 ENCOUNTER — Other Ambulatory Visit: Payer: Medicare Other

## 2018-03-13 ENCOUNTER — Ambulatory Visit: Payer: Medicare Other

## 2018-03-15 ENCOUNTER — Inpatient Hospital Stay: Payer: Medicare Other

## 2018-03-15 DIAGNOSIS — C561 Malignant neoplasm of right ovary: Secondary | ICD-10-CM

## 2018-03-15 LAB — CBC WITH DIFFERENTIAL (CANCER CENTER ONLY)
BASOS ABS: 0 10*3/uL (ref 0.0–0.1)
BASOS PCT: 1 %
Eosinophils Absolute: 0.1 10*3/uL (ref 0.0–0.5)
Eosinophils Relative: 3 %
HEMATOCRIT: 36.6 % (ref 34.8–46.6)
HEMOGLOBIN: 12.2 g/dL (ref 11.6–15.9)
Lymphocytes Relative: 29 %
Lymphs Abs: 0.9 10*3/uL (ref 0.9–3.3)
MCH: 31.3 pg (ref 25.1–34.0)
MCHC: 33.3 g/dL (ref 31.5–36.0)
MCV: 93.8 fL (ref 79.5–101.0)
Monocytes Absolute: 0.4 10*3/uL (ref 0.1–0.9)
Monocytes Relative: 12 %
NEUTROS ABS: 1.8 10*3/uL (ref 1.5–6.5)
NEUTROS PCT: 55 %
Platelet Count: 77 10*3/uL — ABNORMAL LOW (ref 145–400)
RBC: 3.9 MIL/uL (ref 3.70–5.45)
RDW: 14.5 % (ref 11.2–14.5)
WBC Count: 3.2 10*3/uL — ABNORMAL LOW (ref 3.9–10.3)

## 2018-03-15 LAB — COMPREHENSIVE METABOLIC PANEL
ALK PHOS: 63 U/L (ref 40–150)
ALT: 12 U/L (ref 0–55)
AST: 20 U/L (ref 5–34)
Albumin: 4 g/dL (ref 3.5–5.0)
Anion gap: 7 (ref 3–11)
BUN: 21 mg/dL (ref 7–26)
CALCIUM: 9.3 mg/dL (ref 8.4–10.4)
CO2: 25 mmol/L (ref 22–29)
CREATININE: 0.98 mg/dL (ref 0.60–1.10)
Chloride: 102 mmol/L (ref 98–109)
GFR, EST NON AFRICAN AMERICAN: 55 mL/min — AB (ref >60)
Glucose, Bld: 84 mg/dL (ref 70–140)
Potassium: 4.8 mmol/L (ref 3.5–5.1)
Sodium: 134 mmol/L — ABNORMAL LOW (ref 136–145)
Total Bilirubin: 0.6 mg/dL (ref 0.2–1.2)
Total Protein: 7.2 g/dL (ref 6.4–8.3)

## 2018-03-15 LAB — MAGNESIUM: MAGNESIUM: 2 mg/dL (ref 1.7–2.4)

## 2018-03-21 ENCOUNTER — Inpatient Hospital Stay: Payer: Medicare Other | Attending: Gynecology

## 2018-03-21 DIAGNOSIS — Z5189 Encounter for other specified aftercare: Secondary | ICD-10-CM | POA: Insufficient documentation

## 2018-03-21 DIAGNOSIS — C561 Malignant neoplasm of right ovary: Secondary | ICD-10-CM | POA: Insufficient documentation

## 2018-03-21 DIAGNOSIS — Z7189 Other specified counseling: Secondary | ICD-10-CM

## 2018-03-21 LAB — CBC WITH DIFFERENTIAL (CANCER CENTER ONLY)
BASOS ABS: 0 10*3/uL (ref 0.0–0.1)
Basophils Relative: 2 %
EOS ABS: 0.1 10*3/uL (ref 0.0–0.5)
Eosinophils Relative: 5 %
HCT: 37.9 % (ref 34.8–46.6)
HEMOGLOBIN: 12.6 g/dL (ref 11.6–15.9)
LYMPHS ABS: 0.8 10*3/uL — AB (ref 0.9–3.3)
LYMPHS PCT: 30 %
MCH: 31.3 pg (ref 25.1–34.0)
MCHC: 33.2 g/dL (ref 31.5–36.0)
MCV: 94.3 fL (ref 79.5–101.0)
Monocytes Absolute: 0.3 10*3/uL (ref 0.1–0.9)
Monocytes Relative: 11 %
NEUTROS PCT: 52 %
Neutro Abs: 1.4 10*3/uL — ABNORMAL LOW (ref 1.5–6.5)
Platelet Count: 113 10*3/uL — ABNORMAL LOW (ref 145–400)
RBC: 4.02 MIL/uL (ref 3.70–5.45)
RDW: 14.4 % (ref 11.2–14.5)
WBC Count: 2.6 10*3/uL — ABNORMAL LOW (ref 3.9–10.3)

## 2018-03-21 LAB — COMPREHENSIVE METABOLIC PANEL
ALT: 12 U/L (ref 0–55)
AST: 20 U/L (ref 5–34)
Albumin: 4.2 g/dL (ref 3.5–5.0)
Alkaline Phosphatase: 62 U/L (ref 40–150)
Anion gap: 7 (ref 3–11)
BUN: 16 mg/dL (ref 7–26)
CHLORIDE: 104 mmol/L (ref 98–109)
CO2: 26 mmol/L (ref 22–29)
Calcium: 9.6 mg/dL (ref 8.4–10.4)
Creatinine, Ser: 0.89 mg/dL (ref 0.60–1.10)
Glucose, Bld: 77 mg/dL (ref 70–140)
POTASSIUM: 4.1 mmol/L (ref 3.5–5.1)
Sodium: 137 mmol/L (ref 136–145)
Total Bilirubin: 0.5 mg/dL (ref 0.2–1.2)
Total Protein: 7.6 g/dL (ref 6.4–8.3)

## 2018-03-21 LAB — MAGNESIUM: Magnesium: 2 mg/dL (ref 1.7–2.4)

## 2018-03-23 ENCOUNTER — Telehealth: Payer: Self-pay | Admitting: *Deleted

## 2018-03-23 ENCOUNTER — Other Ambulatory Visit: Payer: Self-pay | Admitting: Gynecologic Oncology

## 2018-03-23 DIAGNOSIS — D702 Other drug-induced agranulocytosis: Secondary | ICD-10-CM

## 2018-03-23 MED ORDER — PEGFILGRASTIM-CBQV 6 MG/0.6ML ~~LOC~~ SOSY: 6.0000 mg | PREFILLED_SYRINGE | Freq: Once | SUBCUTANEOUS | Status: DC

## 2018-03-23 NOTE — Telephone Encounter (Signed)
Spoke with the patient and scheduled her injection appt for Wednesday May 8th at 10:15am. Cancelled her lab appt

## 2018-03-28 ENCOUNTER — Inpatient Hospital Stay: Payer: Medicare Other

## 2018-03-28 ENCOUNTER — Other Ambulatory Visit: Payer: Self-pay | Admitting: Hematology and Oncology

## 2018-03-28 ENCOUNTER — Other Ambulatory Visit: Payer: Self-pay | Admitting: Gynecologic Oncology

## 2018-03-28 ENCOUNTER — Other Ambulatory Visit: Payer: Medicare Other

## 2018-03-28 VITALS — BP 156/66 | HR 63 | Temp 97.8°F | Resp 20

## 2018-03-28 DIAGNOSIS — D696 Thrombocytopenia, unspecified: Secondary | ICD-10-CM

## 2018-03-28 DIAGNOSIS — C561 Malignant neoplasm of right ovary: Secondary | ICD-10-CM

## 2018-03-28 DIAGNOSIS — D702 Other drug-induced agranulocytosis: Secondary | ICD-10-CM

## 2018-03-28 MED ORDER — HYDRALAZINE HCL 25 MG PO TABS
25.00 | ORAL_TABLET | ORAL | Status: DC
Start: 2018-03-27 — End: 2018-03-28

## 2018-03-28 MED ORDER — PROCHLORPERAZINE MALEATE 10 MG PO TABS
10.00 | ORAL_TABLET | ORAL | Status: DC
Start: ? — End: 2018-03-28

## 2018-03-28 MED ORDER — PROMETHAZINE HCL 25 MG PO TABS
25.00 | ORAL_TABLET | ORAL | Status: DC
Start: ? — End: 2018-03-28

## 2018-03-28 MED ORDER — ONDANSETRON HCL 4 MG/2ML IJ SOLN
4.00 | INTRAMUSCULAR | Status: DC
Start: ? — End: 2018-03-28

## 2018-03-28 MED ORDER — FAMOTIDINE 20 MG/2ML IV SOLN
20.00 | INTRAVENOUS | Status: DC
Start: ? — End: 2018-03-28

## 2018-03-28 MED ORDER — GENERIC EXTERNAL MEDICATION
10.00 | Status: DC
Start: ? — End: 2018-03-28

## 2018-03-28 MED ORDER — DIPHENHYDRAMINE HCL 50 MG/ML IJ SOLN
25.00 | INTRAMUSCULAR | Status: DC
Start: ? — End: 2018-03-28

## 2018-03-28 MED ORDER — MEPERIDINE HCL 25 MG/ML IJ SOLN
25.00 | INTRAMUSCULAR | Status: DC
Start: ? — End: 2018-03-28

## 2018-03-28 MED ORDER — PEGFILGRASTIM-CBQV 6 MG/0.6ML ~~LOC~~ SOSY
6.0000 mg | PREFILLED_SYRINGE | Freq: Once | SUBCUTANEOUS | Status: AC
  Administered 2018-03-28: 6 mg via SUBCUTANEOUS

## 2018-03-28 MED ORDER — PEGFILGRASTIM-CBQV 6 MG/0.6ML ~~LOC~~ SOSY: 6.0000 mg | PREFILLED_SYRINGE | Freq: Once | SUBCUTANEOUS | 0 refills | Status: DC

## 2018-03-28 MED ORDER — PEGFILGRASTIM-CBQV 6 MG/0.6ML ~~LOC~~ SOSY: 6.0000 mg | PREFILLED_SYRINGE | Freq: Once | SUBCUTANEOUS | Status: DC

## 2018-03-28 MED ORDER — LISINOPRIL 20 MG PO TABS
20.00 | ORAL_TABLET | ORAL | Status: DC
Start: 2018-03-28 — End: 2018-03-28

## 2018-03-28 MED ORDER — ACETAMINOPHEN 325 MG PO TABS
650.00 | ORAL_TABLET | ORAL | Status: DC
Start: ? — End: 2018-03-28

## 2018-03-28 MED ORDER — SODIUM CHLORIDE 0.9 % IV SOLN
1000.00 | INTRAVENOUS | Status: DC
Start: ? — End: 2018-03-28

## 2018-03-28 MED ORDER — EPINEPHRINE 0.3 MG/0.3ML IJ SOAJ
.30 | INTRAMUSCULAR | Status: DC
Start: ? — End: 2018-03-28

## 2018-03-28 MED ORDER — ONDANSETRON HCL 8 MG PO TABS
4.00 | ORAL_TABLET | ORAL | Status: DC
Start: ? — End: 2018-03-28

## 2018-03-28 MED ORDER — SODIUM CHLORIDE 0.9 % IV SOLN
100.00 | INTRAVENOUS | Status: DC
Start: ? — End: 2018-03-28

## 2018-03-28 MED ORDER — METHYLPREDNISOLONE SODIUM SUCC 125 MG IJ SOLR
125.00 | INTRAMUSCULAR | Status: DC
Start: ? — End: 2018-03-28

## 2018-03-28 MED ORDER — PROMETHAZINE HCL 25 MG RE SUPP
25.00 | RECTAL | Status: DC
Start: ? — End: 2018-03-28

## 2018-03-28 MED ORDER — SODIUM CHLORIDE 0.9 % IV SOLN
20.00 | INTRAVENOUS | Status: DC
Start: ? — End: 2018-03-28

## 2018-03-28 MED ORDER — GENERIC EXTERNAL MEDICATION
Status: DC
Start: ? — End: 2018-03-28

## 2018-03-28 MED ORDER — PEGFILGRASTIM-CBQV 6 MG/0.6ML ~~LOC~~ SOSY
PREFILLED_SYRINGE | SUBCUTANEOUS | Status: AC
  Filled 2018-03-28: qty 0.6

## 2018-03-29 NOTE — Progress Notes (Signed)
Udenyca ordered per Dr. Fermin Schwab.  Patient receiving chemo at [facility].  Patient was previously followed by Dr Alvy Bimler but transferred care to [facility] to undergo carboplatin desensitization protocol.  Patient lives locally and Dr. C-P ordered neupogen.  Medication substituted for udenyca per facility protocol and Dr. C-P agreeable with the change.  Pharmacist put in order for medication under Dr. Calton Dach name for co-sign not realizing transition of care to Dr. C-P.  Co-signature on verbal order declined per Dr. Alvy Bimler since she no longer sees patient.  New order for medication unable to be transcribed since medication given and charted against the previous order. Medication and dosing correct as given.

## 2018-04-04 ENCOUNTER — Inpatient Hospital Stay: Payer: Medicare Other

## 2018-04-04 DIAGNOSIS — C561 Malignant neoplasm of right ovary: Secondary | ICD-10-CM | POA: Diagnosis not present

## 2018-04-04 LAB — CBC WITH DIFFERENTIAL (CANCER CENTER ONLY)
Basophils Absolute: 0.1 10*3/uL (ref 0.0–0.1)
Basophils Relative: 1 %
EOS ABS: 0 10*3/uL (ref 0.0–0.5)
Eosinophils Relative: 0 %
HEMATOCRIT: 36.9 % (ref 34.8–46.6)
Hemoglobin: 12.4 g/dL (ref 11.6–15.9)
Lymphocytes Relative: 12 %
Lymphs Abs: 1.3 10*3/uL (ref 0.9–3.3)
MCH: 31.7 pg (ref 25.1–34.0)
MCHC: 33.6 g/dL (ref 31.5–36.0)
MCV: 94.4 fL (ref 79.5–101.0)
MONO ABS: 0.4 10*3/uL (ref 0.1–0.9)
MONOS PCT: 4 %
Neutro Abs: 8.9 10*3/uL — ABNORMAL HIGH (ref 1.5–6.5)
Neutrophils Relative %: 83 %
Platelet Count: 81 10*3/uL — ABNORMAL LOW (ref 145–400)
RBC: 3.91 MIL/uL (ref 3.70–5.45)
RDW: 14.6 % — AB (ref 11.2–14.5)
WBC Count: 10.8 10*3/uL — ABNORMAL HIGH (ref 3.9–10.3)

## 2018-04-11 ENCOUNTER — Inpatient Hospital Stay: Payer: Medicare Other

## 2018-04-11 DIAGNOSIS — C561 Malignant neoplasm of right ovary: Secondary | ICD-10-CM | POA: Diagnosis not present

## 2018-04-11 LAB — CBC WITH DIFFERENTIAL (CANCER CENTER ONLY)
BASOS ABS: 0 10*3/uL (ref 0.0–0.1)
BASOS PCT: 1 %
EOS ABS: 0 10*3/uL (ref 0.0–0.5)
EOS PCT: 1 %
HEMATOCRIT: 37 % (ref 34.8–46.6)
Hemoglobin: 12.5 g/dL (ref 11.6–15.9)
Lymphocytes Relative: 16 %
Lymphs Abs: 1 10*3/uL (ref 0.9–3.3)
MCH: 31.8 pg (ref 25.1–34.0)
MCHC: 33.7 g/dL (ref 31.5–36.0)
MCV: 94.3 fL (ref 79.5–101.0)
MONO ABS: 0.5 10*3/uL (ref 0.1–0.9)
MONOS PCT: 8 %
NEUTROS ABS: 4.7 10*3/uL (ref 1.5–6.5)
Neutrophils Relative %: 74 %
PLATELETS: 113 10*3/uL — AB (ref 145–400)
RBC: 3.92 MIL/uL (ref 3.70–5.45)
RDW: 14.5 % (ref 11.2–14.5)
WBC Count: 6.2 10*3/uL (ref 3.9–10.3)

## 2018-04-11 LAB — COMPREHENSIVE METABOLIC PANEL
ALBUMIN: 3.9 g/dL (ref 3.5–5.0)
ALK PHOS: 81 U/L (ref 40–150)
ALT: 13 U/L (ref 0–55)
ANION GAP: 9 (ref 3–11)
AST: 17 U/L (ref 5–34)
BILIRUBIN TOTAL: 0.3 mg/dL (ref 0.2–1.2)
BUN: 24 mg/dL (ref 7–26)
CALCIUM: 9.6 mg/dL (ref 8.4–10.4)
CO2: 25 mmol/L (ref 22–29)
Chloride: 105 mmol/L (ref 98–109)
Creatinine, Ser: 0.79 mg/dL (ref 0.60–1.10)
GFR calc Af Amer: >60 mL/min (ref >60)
GFR calc non Af Amer: >60 mL/min (ref >60)
GLUCOSE: 70 mg/dL (ref 70–140)
Potassium: 3.9 mmol/L (ref 3.5–5.1)
SODIUM: 139 mmol/L (ref 136–145)
TOTAL PROTEIN: 7.3 g/dL (ref 6.4–8.3)

## 2018-04-11 LAB — MAGNESIUM: Magnesium: 1.5 mg/dL — ABNORMAL LOW (ref 1.7–2.4)

## 2018-04-12 ENCOUNTER — Other Ambulatory Visit: Payer: Self-pay | Admitting: Gynecologic Oncology

## 2018-04-12 DIAGNOSIS — D702 Other drug-induced agranulocytosis: Secondary | ICD-10-CM

## 2018-04-12 NOTE — Progress Notes (Signed)
Per Lyn with Dr. Judeth Porch at [facility]: cancel lab appt for Wed, May 29 and arrange for Udenyca injection that am.

## 2018-04-13 ENCOUNTER — Telehealth: Payer: Self-pay | Admitting: *Deleted

## 2018-04-13 NOTE — Telephone Encounter (Signed)
Called the patient and gave the injection appt for Wednesday at [facility]

## 2018-04-18 ENCOUNTER — Inpatient Hospital Stay: Payer: Medicare Other

## 2018-04-18 DIAGNOSIS — C561 Malignant neoplasm of right ovary: Secondary | ICD-10-CM | POA: Diagnosis not present

## 2018-04-18 DIAGNOSIS — D702 Other drug-induced agranulocytosis: Secondary | ICD-10-CM

## 2018-04-18 MED ORDER — EPINEPHRINE 0.3 MG/0.3ML IJ SOAJ
0.30 | INTRAMUSCULAR | Status: DC
Start: ? — End: 2018-04-18

## 2018-04-18 MED ORDER — SODIUM CHLORIDE 0.9 % IV SOLN
1000.00 | INTRAVENOUS | Status: DC
Start: ? — End: 2018-04-18

## 2018-04-18 MED ORDER — FAMOTIDINE 20 MG/2ML IV SOLN
20.00 | INTRAVENOUS | Status: DC
Start: ? — End: 2018-04-18

## 2018-04-18 MED ORDER — GENERIC EXTERNAL MEDICATION
Status: DC
Start: ? — End: 2018-04-18

## 2018-04-18 MED ORDER — ENOXAPARIN SODIUM 40 MG/0.4ML ~~LOC~~ SOLN
40.00 | SUBCUTANEOUS | Status: DC
Start: 2018-04-18 — End: 2018-04-18

## 2018-04-18 MED ORDER — PEGFILGRASTIM-CBQV 6 MG/0.6ML ~~LOC~~ SOSY
6.0000 mg | PREFILLED_SYRINGE | Freq: Once | SUBCUTANEOUS | Status: AC
  Administered 2018-04-18: 6 mg via SUBCUTANEOUS

## 2018-04-18 MED ORDER — PROMETHAZINE HCL 25 MG RE SUPP
25.00 | RECTAL | Status: DC
Start: ? — End: 2018-04-18

## 2018-04-18 MED ORDER — ACETAMINOPHEN 325 MG PO TABS
650.00 | ORAL_TABLET | ORAL | Status: DC
Start: ? — End: 2018-04-18

## 2018-04-18 MED ORDER — PROMETHAZINE HCL 25 MG PO TABS
25.00 | ORAL_TABLET | ORAL | Status: DC
Start: ? — End: 2018-04-18

## 2018-04-18 MED ORDER — ONDANSETRON HCL 4 MG/2ML IJ SOLN
4.00 | INTRAMUSCULAR | Status: DC
Start: ? — End: 2018-04-18

## 2018-04-18 MED ORDER — ONDANSETRON HCL 8 MG PO TABS
4.00 | ORAL_TABLET | ORAL | Status: DC
Start: ? — End: 2018-04-18

## 2018-04-18 MED ORDER — SODIUM CHLORIDE 0.9 % IV SOLN
100.00 | INTRAVENOUS | Status: DC
Start: ? — End: 2018-04-18

## 2018-04-18 MED ORDER — SODIUM CHLORIDE 0.9 % IV SOLN
20.00 | INTRAVENOUS | Status: DC
Start: ? — End: 2018-04-18

## 2018-04-18 MED ORDER — DIPHENHYDRAMINE HCL 50 MG/ML IJ SOLN
25.00 | INTRAMUSCULAR | Status: DC
Start: ? — End: 2018-04-18

## 2018-04-18 MED ORDER — GENERIC EXTERNAL MEDICATION
30.00 | Status: DC
Start: 2018-04-18 — End: 2018-04-18

## 2018-04-18 MED ORDER — HYDRALAZINE HCL 25 MG PO TABS
25.00 | ORAL_TABLET | ORAL | Status: DC
Start: 2018-04-17 — End: 2018-04-18

## 2018-04-18 MED ORDER — MEPERIDINE HCL 25 MG/ML IJ SOLN
25.00 | INTRAMUSCULAR | Status: DC
Start: ? — End: 2018-04-18

## 2018-04-18 MED ORDER — GENERIC EXTERNAL MEDICATION
10.00 | Status: DC
Start: ? — End: 2018-04-18

## 2018-04-18 MED ORDER — METHYLPREDNISOLONE SODIUM SUCC 125 MG IJ SOLR
125.00 | INTRAMUSCULAR | Status: DC
Start: ? — End: 2018-04-18

## 2018-04-18 MED ORDER — PEGFILGRASTIM-CBQV 6 MG/0.6ML ~~LOC~~ SOSY
PREFILLED_SYRINGE | SUBCUTANEOUS | Status: AC
  Filled 2018-04-18: qty 0.6

## 2018-04-18 MED ORDER — PROCHLORPERAZINE MALEATE 10 MG PO TABS
10.00 | ORAL_TABLET | ORAL | Status: DC
Start: ? — End: 2018-04-18

## 2018-04-18 NOTE — Patient Instructions (Signed)
Pegfilgrastim injection What is this medicine? PEGFILGRASTIM (PEG fil gra stim) is a long-acting granulocyte colony-stimulating factor that stimulates the growth of neutrophils, a type of white blood cell important in the body's fight against infection. It is used to reduce the incidence of fever and infection in patients with certain types of cancer who are receiving chemotherapy that affects the bone marrow, and to increase survival after being exposed to high doses of radiation. This medicine may be used for other purposes; ask your health care provider or pharmacist if you have questions. COMMON BRAND NAME(S): Neulasta What should I tell my health care provider before I take this medicine? They need to know if you have any of these conditions: -kidney disease -latex allergy -ongoing radiation therapy -sickle cell disease -skin reactions to acrylic adhesives (On-Body Injector only) -an unusual or allergic reaction to pegfilgrastim, filgrastim, other medicines, foods, dyes, or preservatives -pregnant or trying to get pregnant -breast-feeding How should I use this medicine? This medicine is for injection under the skin. If you get this medicine at home, you will be taught how to prepare and give the pre-filled syringe or how to use the On-body Injector. Refer to the patient Instructions for Use for detailed instructions. Use exactly as directed. Tell your healthcare provider immediately if you suspect that the On-body Injector may not have performed as intended or if you suspect the use of the On-body Injector resulted in a missed or partial dose. It is important that you put your used needles and syringes in a special sharps container. Do not put them in a trash can. If you do not have a sharps container, call your pharmacist or healthcare provider to get one. Talk to your pediatrician regarding the use of this medicine in children. While this drug may be prescribed for selected conditions,  precautions do apply. Overdosage: If you think you have taken too much of this medicine contact a poison control center or emergency room at once. NOTE: This medicine is only for you. Do not share this medicine with others. What if I miss a dose? It is important not to miss your dose. Call your doctor or health care professional if you miss your dose. If you miss a dose due to an On-body Injector failure or leakage, a new dose should be administered as soon as possible using a single prefilled syringe for manual use. What may interact with this medicine? Interactions have not been studied. Give your health care provider a list of all the medicines, herbs, non-prescription drugs, or dietary supplements you use. Also tell them if you smoke, drink alcohol, or use illegal drugs. Some items may interact with your medicine. This list may not describe all possible interactions. Give your health care provider a list of all the medicines, herbs, non-prescription drugs, or dietary supplements you use. Also tell them if you smoke, drink alcohol, or use illegal drugs. Some items may interact with your medicine. What should I watch for while using this medicine? You may need blood work done while you are taking this medicine. If you are going to need a MRI, CT scan, or other procedure, tell your doctor that you are using this medicine (On-Body Injector only). What side effects may I notice from receiving this medicine? Side effects that you should report to your doctor or health care professional as soon as possible: -allergic reactions like skin rash, itching or hives, swelling of the face, lips, or tongue -dizziness -fever -pain, redness, or irritation at site   where injected -pinpoint red spots on the skin -red or dark-brown urine -shortness of breath or breathing problems -stomach or side pain, or pain at the shoulder -swelling -tiredness -trouble passing urine or change in the amount of urine Side  effects that usually do not require medical attention (report to your doctor or health care professional if they continue or are bothersome): -bone pain -muscle pain This list may not describe all possible side effects. Call your doctor for medical advice about side effects. You may report side effects to FDA at 1-800-FDA-1088. Where should I keep my medicine? Keep out of the reach of children. Store pre-filled syringes in a refrigerator between 2 and 8 degrees C (36 and 46 degrees F). Do not freeze. Keep in carton to protect from light. Throw away this medicine if it is left out of the refrigerator for more than 48 hours. Throw away any unused medicine after the expiration date. NOTE: This sheet is a summary. It may not cover all possible information. If you have questions about this medicine, talk to your doctor, pharmacist, or health care provider.  2018 Elsevier/Gold Standard (2016-11-03 12:58:03)  

## 2018-04-25 ENCOUNTER — Inpatient Hospital Stay: Payer: Medicare Other | Attending: Gynecology

## 2018-04-25 DIAGNOSIS — C561 Malignant neoplasm of right ovary: Secondary | ICD-10-CM

## 2018-04-25 DIAGNOSIS — Z5189 Encounter for other specified aftercare: Secondary | ICD-10-CM | POA: Diagnosis not present

## 2018-04-25 DIAGNOSIS — Z5111 Encounter for antineoplastic chemotherapy: Secondary | ICD-10-CM | POA: Insufficient documentation

## 2018-04-25 LAB — CBC WITH DIFFERENTIAL (CANCER CENTER ONLY)
Basophils Absolute: 0.1 10*3/uL (ref 0.0–0.1)
Basophils Relative: 1 %
EOS PCT: 0 %
Eosinophils Absolute: 0 10*3/uL (ref 0.0–0.5)
HCT: 35.7 % (ref 34.8–46.6)
Hemoglobin: 11.8 g/dL (ref 11.6–15.9)
LYMPHS ABS: 1.5 10*3/uL (ref 0.9–3.3)
LYMPHS PCT: 15 %
MCH: 32 pg (ref 25.1–34.0)
MCHC: 33.1 g/dL (ref 31.5–36.0)
MCV: 96.7 fL (ref 79.5–101.0)
Monocytes Absolute: 1.1 10*3/uL — ABNORMAL HIGH (ref 0.1–0.9)
Monocytes Relative: 11 %
NEUTROS PCT: 73 %
Neutro Abs: 7.2 10*3/uL — ABNORMAL HIGH (ref 1.5–6.5)
PLATELETS: 81 10*3/uL — AB (ref 145–400)
RBC: 3.69 MIL/uL — AB (ref 3.70–5.45)
RDW: 14.1 % (ref 11.2–14.5)
WBC: 9.8 10*3/uL (ref 3.9–10.3)

## 2018-05-02 ENCOUNTER — Inpatient Hospital Stay: Payer: Medicare Other

## 2018-05-02 DIAGNOSIS — Z5111 Encounter for antineoplastic chemotherapy: Secondary | ICD-10-CM | POA: Diagnosis not present

## 2018-05-02 DIAGNOSIS — C561 Malignant neoplasm of right ovary: Secondary | ICD-10-CM

## 2018-05-02 LAB — CBC WITH DIFFERENTIAL (CANCER CENTER ONLY)
BASOS ABS: 0 10*3/uL (ref 0.0–0.1)
Basophils Relative: 1 %
EOS PCT: 1 %
Eosinophils Absolute: 0 10*3/uL (ref 0.0–0.5)
HEMATOCRIT: 38.6 % (ref 34.8–46.6)
Hemoglobin: 12.8 g/dL (ref 11.6–15.9)
LYMPHS ABS: 1.1 10*3/uL (ref 0.9–3.3)
Lymphocytes Relative: 18 %
MCH: 32.1 pg (ref 25.1–34.0)
MCHC: 33.2 g/dL (ref 31.5–36.0)
MCV: 96.7 fL (ref 79.5–101.0)
MONO ABS: 0.7 10*3/uL (ref 0.1–0.9)
Monocytes Relative: 11 %
NEUTROS ABS: 4.4 10*3/uL (ref 1.5–6.5)
Neutrophils Relative %: 69 %
Platelet Count: 113 10*3/uL — ABNORMAL LOW (ref 145–400)
RBC: 3.99 MIL/uL (ref 3.70–5.45)
RDW: 14.3 % (ref 11.2–14.5)
WBC Count: 6.2 10*3/uL (ref 3.9–10.3)

## 2018-05-03 ENCOUNTER — Telehealth: Payer: Self-pay | Admitting: *Deleted

## 2018-05-03 ENCOUNTER — Other Ambulatory Visit: Payer: Self-pay | Admitting: Gynecologic Oncology

## 2018-05-03 ENCOUNTER — Inpatient Hospital Stay: Payer: Medicare Other

## 2018-05-03 DIAGNOSIS — Z5111 Encounter for antineoplastic chemotherapy: Secondary | ICD-10-CM | POA: Diagnosis not present

## 2018-05-03 DIAGNOSIS — D702 Other drug-induced agranulocytosis: Secondary | ICD-10-CM

## 2018-05-03 DIAGNOSIS — C561 Malignant neoplasm of right ovary: Secondary | ICD-10-CM

## 2018-05-03 LAB — COMPREHENSIVE METABOLIC PANEL
ALBUMIN: 4.3 g/dL (ref 3.5–5.0)
ALT: 12 U/L (ref 0–55)
ANION GAP: 12 — AB (ref 3–11)
AST: 24 U/L (ref 5–34)
Alkaline Phosphatase: 81 U/L (ref 40–150)
BILIRUBIN TOTAL: 0.5 mg/dL (ref 0.2–1.2)
BUN: 18 mg/dL (ref 7–26)
CO2: 23 mmol/L (ref 22–29)
Calcium: 9.9 mg/dL (ref 8.4–10.4)
Chloride: 102 mmol/L (ref 98–109)
Creatinine, Ser: 0.8 mg/dL (ref 0.60–1.10)
GFR calc Af Amer: >60 mL/min (ref >60)
GFR calc non Af Amer: >60 mL/min (ref >60)
GLUCOSE: 64 mg/dL — AB (ref 70–140)
POTASSIUM: 4.4 mmol/L (ref 3.5–5.1)
SODIUM: 137 mmol/L (ref 136–145)
TOTAL PROTEIN: 7.8 g/dL (ref 6.4–8.3)

## 2018-05-03 LAB — MAGNESIUM: Magnesium: 1.6 mg/dL — ABNORMAL LOW (ref 1.7–2.4)

## 2018-05-03 NOTE — Telephone Encounter (Signed)
Returned the patient's call and scheduled lab appt for today. Patient also scheduled for her injection on 6/19. Cancelled all other labs due to next week being the last chemo.

## 2018-05-03 NOTE — Progress Notes (Signed)
Per Dr. Fermin Schwab, pt is having her sixth cycle of chemo at [facility].  Udenyca ordered per Dr. Judeth Porch

## 2018-05-09 ENCOUNTER — Inpatient Hospital Stay: Payer: Medicare Other

## 2018-05-09 ENCOUNTER — Other Ambulatory Visit: Payer: Medicare Other

## 2018-05-09 DIAGNOSIS — Z5111 Encounter for antineoplastic chemotherapy: Secondary | ICD-10-CM | POA: Diagnosis not present

## 2018-05-09 DIAGNOSIS — D702 Other drug-induced agranulocytosis: Secondary | ICD-10-CM

## 2018-05-09 MED ORDER — METHYLPREDNISOLONE SODIUM SUCC 125 MG IJ SOLR
125.00 | INTRAMUSCULAR | Status: DC
Start: ? — End: 2018-05-09

## 2018-05-09 MED ORDER — FAMOTIDINE 20 MG/2ML IV SOLN
20.00 | INTRAVENOUS | Status: DC
Start: ? — End: 2018-05-09

## 2018-05-09 MED ORDER — ACETAMINOPHEN 325 MG PO TABS
650.00 | ORAL_TABLET | ORAL | Status: DC
Start: ? — End: 2018-05-09

## 2018-05-09 MED ORDER — LISINOPRIL 10 MG PO TABS
10.00 | ORAL_TABLET | ORAL | Status: DC
Start: 2018-05-09 — End: 2018-05-09

## 2018-05-09 MED ORDER — PROCHLORPERAZINE MALEATE 10 MG PO TABS
10.00 | ORAL_TABLET | ORAL | Status: DC
Start: ? — End: 2018-05-09

## 2018-05-09 MED ORDER — PROMETHAZINE HCL 25 MG PO TABS
25.00 | ORAL_TABLET | ORAL | Status: DC
Start: ? — End: 2018-05-09

## 2018-05-09 MED ORDER — GENERIC EXTERNAL MEDICATION
Status: DC
Start: ? — End: 2018-05-09

## 2018-05-09 MED ORDER — ENOXAPARIN SODIUM 40 MG/0.4ML ~~LOC~~ SOLN
40.00 | SUBCUTANEOUS | Status: DC
Start: 2018-05-09 — End: 2018-05-09

## 2018-05-09 MED ORDER — PEGFILGRASTIM-CBQV 6 MG/0.6ML ~~LOC~~ SOSY
PREFILLED_SYRINGE | SUBCUTANEOUS | Status: AC
  Filled 2018-05-09: qty 0.6

## 2018-05-09 MED ORDER — LORATADINE 10 MG PO TABS
10.00 | ORAL_TABLET | ORAL | Status: DC
Start: 2018-05-09 — End: 2018-05-09

## 2018-05-09 MED ORDER — HYDRALAZINE HCL 25 MG PO TABS
25.00 | ORAL_TABLET | ORAL | Status: DC
Start: 2018-05-08 — End: 2018-05-09

## 2018-05-09 MED ORDER — ONDANSETRON HCL 8 MG PO TABS
4.00 | ORAL_TABLET | ORAL | Status: DC
Start: ? — End: 2018-05-09

## 2018-05-09 MED ORDER — PROMETHAZINE HCL 25 MG RE SUPP
25.00 | RECTAL | Status: DC
Start: ? — End: 2018-05-09

## 2018-05-09 MED ORDER — SODIUM CHLORIDE 0.9 % IV SOLN
20.00 | INTRAVENOUS | Status: DC
Start: ? — End: 2018-05-09

## 2018-05-09 MED ORDER — GENERIC EXTERNAL MEDICATION
10.00 | Status: DC
Start: ? — End: 2018-05-09

## 2018-05-09 MED ORDER — MEPERIDINE HCL 25 MG/ML IJ SOLN
25.00 | INTRAMUSCULAR | Status: DC
Start: ? — End: 2018-05-09

## 2018-05-09 MED ORDER — EPINEPHRINE 0.3 MG/0.3ML IJ SOAJ
.30 | INTRAMUSCULAR | Status: DC
Start: ? — End: 2018-05-09

## 2018-05-09 MED ORDER — PEGFILGRASTIM-CBQV 6 MG/0.6ML ~~LOC~~ SOSY
6.0000 mg | PREFILLED_SYRINGE | Freq: Once | SUBCUTANEOUS | Status: AC
  Administered 2018-05-09: 6 mg via SUBCUTANEOUS

## 2018-05-09 MED ORDER — POLYETHYLENE GLYCOL 3350 17 G PO PACK
17.00 | PACK | ORAL | Status: DC
Start: ? — End: 2018-05-09

## 2018-05-09 MED ORDER — VITAMIN E 45 MG (100 UNIT) PO CAPS
400.00 | ORAL_CAPSULE | ORAL | Status: DC
Start: 2018-05-09 — End: 2018-05-09

## 2018-05-09 MED ORDER — IBUPROFEN 600 MG PO TABS
600.00 | ORAL_TABLET | ORAL | Status: DC
Start: ? — End: 2018-05-09

## 2018-05-09 MED ORDER — SODIUM CHLORIDE 0.9 % IV SOLN
1000.00 | INTRAVENOUS | Status: DC
Start: ? — End: 2018-05-09

## 2018-05-09 MED ORDER — DIPHENHYDRAMINE HCL 50 MG/ML IJ SOLN
25.00 | INTRAMUSCULAR | Status: DC
Start: ? — End: 2018-05-09

## 2018-05-09 MED ORDER — ALUM & MAG HYDROXIDE-SIMETH 400-400-40 MG/5ML PO SUSP
30.00 | ORAL | Status: DC
Start: ? — End: 2018-05-09

## 2018-05-09 MED ORDER — ONDANSETRON HCL 4 MG/2ML IJ SOLN
4.00 | INTRAMUSCULAR | Status: DC
Start: ? — End: 2018-05-09

## 2018-05-09 MED ORDER — SODIUM CHLORIDE 0.9 % IV SOLN
100.00 | INTRAVENOUS | Status: DC
Start: ? — End: 2018-05-09

## 2018-05-09 NOTE — Patient Instructions (Signed)
Pegfilgrastim injection What is this medicine? PEGFILGRASTIM (PEG fil gra stim) is a long-acting granulocyte colony-stimulating factor that stimulates the growth of neutrophils, a type of white blood cell important in the body's fight against infection. It is used to reduce the incidence of fever and infection in patients with certain types of cancer who are receiving chemotherapy that affects the bone marrow, and to increase survival after being exposed to high doses of radiation. This medicine may be used for other purposes; ask your health care provider or pharmacist if you have questions. COMMON BRAND NAME(S): Neulasta What should I tell my health care provider before I take this medicine? They need to know if you have any of these conditions: -kidney disease -latex allergy -ongoing radiation therapy -sickle cell disease -skin reactions to acrylic adhesives (On-Body Injector only) -an unusual or allergic reaction to pegfilgrastim, filgrastim, other medicines, foods, dyes, or preservatives -pregnant or trying to get pregnant -breast-feeding How should I use this medicine? This medicine is for injection under the skin. If you get this medicine at home, you will be taught how to prepare and give the pre-filled syringe or how to use the On-body Injector. Refer to the patient Instructions for Use for detailed instructions. Use exactly as directed. Tell your healthcare provider immediately if you suspect that the On-body Injector may not have performed as intended or if you suspect the use of the On-body Injector resulted in a missed or partial dose. It is important that you put your used needles and syringes in a special sharps container. Do not put them in a trash can. If you do not have a sharps container, call your pharmacist or healthcare provider to get one. Talk to your pediatrician regarding the use of this medicine in children. While this drug may be prescribed for selected conditions,  precautions do apply. Overdosage: If you think you have taken too much of this medicine contact a poison control center or emergency room at once. NOTE: This medicine is only for you. Do not share this medicine with others. What if I miss a dose? It is important not to miss your dose. Call your doctor or health care professional if you miss your dose. If you miss a dose due to an On-body Injector failure or leakage, a new dose should be administered as soon as possible using a single prefilled syringe for manual use. What may interact with this medicine? Interactions have not been studied. Give your health care provider a list of all the medicines, herbs, non-prescription drugs, or dietary supplements you use. Also tell them if you smoke, drink alcohol, or use illegal drugs. Some items may interact with your medicine. This list may not describe all possible interactions. Give your health care provider a list of all the medicines, herbs, non-prescription drugs, or dietary supplements you use. Also tell them if you smoke, drink alcohol, or use illegal drugs. Some items may interact with your medicine. What should I watch for while using this medicine? You may need blood work done while you are taking this medicine. If you are going to need a MRI, CT scan, or other procedure, tell your doctor that you are using this medicine (On-Body Injector only). What side effects may I notice from receiving this medicine? Side effects that you should report to your doctor or health care professional as soon as possible: -allergic reactions like skin rash, itching or hives, swelling of the face, lips, or tongue -dizziness -fever -pain, redness, or irritation at site   where injected -pinpoint red spots on the skin -red or dark-brown urine -shortness of breath or breathing problems -stomach or side pain, or pain at the shoulder -swelling -tiredness -trouble passing urine or change in the amount of urine Side  effects that usually do not require medical attention (report to your doctor or health care professional if they continue or are bothersome): -bone pain -muscle pain This list may not describe all possible side effects. Call your doctor for medical advice about side effects. You may report side effects to FDA at 1-800-FDA-1088. Where should I keep my medicine? Keep out of the reach of children. Store pre-filled syringes in a refrigerator between 2 and 8 degrees C (36 and 46 degrees F). Do not freeze. Keep in carton to protect from light. Throw away this medicine if it is left out of the refrigerator for more than 48 hours. Throw away any unused medicine after the expiration date. NOTE: This sheet is a summary. It may not cover all possible information. If you have questions about this medicine, talk to your doctor, pharmacist, or health care provider.  2018 Elsevier/Gold Standard (2016-11-03 12:58:03)  

## 2018-05-09 NOTE — Progress Notes (Signed)
Per note from [facility], patient completed carboplatin @ 0300 on 05/09/2018.

## 2018-05-14 ENCOUNTER — Telehealth: Payer: Self-pay

## 2018-05-14 NOTE — Telephone Encounter (Signed)
Returned pt's call regarding coordinating appts here for labs, CT, office appt here since she lives in [place].  Pt would like the following addressed with Joylene John NP/ Dr Glennon Mac at [facility]:  1. Augustin Coupe at [facility] would like pt to reach out to our office in regards to whether she needs CBC this week and / or needs CMET also.   2. Does she need repeat CT scan to check on the small soft mass as discussed previously with Dr Loletta SpecterDianah Field.  3. If so, then needs appt with Dr Aldean Ast to discuss results.   4. Supposed to have Avastin every 3 weeks at [facility]- when will that start?  Per patient, for any labs, tests, or office appts- prefers in [place] and best time to schedule her appts are in morning from 9-10 am - around that time if possible. I told pt, I would discuss these questions with Melissa NP and call her back with appts if that is what is ordered. Pt voiced understanding. No other needs per pt at this time.

## 2018-05-16 ENCOUNTER — Other Ambulatory Visit: Payer: Medicare Other

## 2018-05-21 ENCOUNTER — Other Ambulatory Visit: Payer: Self-pay | Admitting: Gynecologic Oncology

## 2018-05-21 ENCOUNTER — Telehealth: Payer: Self-pay | Admitting: Gynecologic Oncology

## 2018-05-21 DIAGNOSIS — C561 Malignant neoplasm of right ovary: Secondary | ICD-10-CM

## 2018-05-22 ENCOUNTER — Telehealth: Payer: Self-pay | Admitting: *Deleted

## 2018-05-22 ENCOUNTER — Other Ambulatory Visit: Payer: Medicare Other

## 2018-05-22 ENCOUNTER — Other Ambulatory Visit: Payer: Self-pay | Admitting: Hematology and Oncology

## 2018-05-22 DIAGNOSIS — C561 Malignant neoplasm of right ovary: Secondary | ICD-10-CM

## 2018-05-22 NOTE — Telephone Encounter (Signed)
Called and gave the date/time for her appts on July 11th and July 15th. Also gave instructions

## 2018-05-22 NOTE — Telephone Encounter (Signed)
Returned call to the patient.  Advised we are working on appts for her CT scan, an appt with Dr. Alvy Bimler, and an appt to see Dr. Fermin Schwab.  Advised she would be contacted with this information.

## 2018-05-22 NOTE — Progress Notes (Signed)
DISCONTINUE ON PATHWAY REGIMEN - Ovarian     A cycle is every 21 days:     Paclitaxel      Bevacizumab      Carboplatin   **Always confirm dose/schedule in your pharmacy ordering system**  REASON: Other Reason PRIOR TREATMENT: [id number]: Carboplatin AUC=5 + Paclitaxel 175 mg/m2 + Bevacizumab 15 mg/kg q21 Days TREATMENT RESPONSE: Unable to Evaluate  START OFF PATHWAY REGIMEN - Ovarian   [id number]:Bevacizumab 15 mg/kg q21d:   A cycle is every 21 days:     Bevacizumab   **Always confirm dose/schedule in your pharmacy ordering system**  Patient Characteristics: Recurrent or Progressive Disease, Maintenance Therapy Therapeutic Status: Recurrent or Progressive Disease AJCC T Category: T3 AJCC N Category: N0 AJCC M Category: M0 AJCC 8 Stage Grouping: Unknown BRCA Mutation Status: Absent Line of Therapy: Maintenance  Intent of Therapy: Non-Curative / Palliative Intent, Discussed with Patient

## 2018-05-23 ENCOUNTER — Other Ambulatory Visit: Payer: Medicare Other

## 2018-05-30 ENCOUNTER — Other Ambulatory Visit: Payer: Medicare Other

## 2018-05-31 ENCOUNTER — Inpatient Hospital Stay: Payer: Medicare Other

## 2018-05-31 ENCOUNTER — Ambulatory Visit (HOSPITAL_COMMUNITY): Payer: Medicare Other

## 2018-05-31 ENCOUNTER — Inpatient Hospital Stay: Payer: Medicare Other | Attending: Gynecology

## 2018-05-31 ENCOUNTER — Other Ambulatory Visit: Payer: Self-pay | Admitting: Hematology and Oncology

## 2018-05-31 ENCOUNTER — Ambulatory Visit (HOSPITAL_COMMUNITY)
Admission: RE | Admit: 2018-05-31 | Discharge: 2018-05-31 | Disposition: A | Discharge status: Home / Self Care | Payer: Medicare Other | Source: Ambulatory Visit | Attending: Gynecologic Oncology | Admitting: Gynecologic Oncology

## 2018-05-31 ENCOUNTER — Encounter (HOSPITAL_COMMUNITY): Payer: Self-pay

## 2018-05-31 DIAGNOSIS — K449 Diaphragmatic hernia without obstruction or gangrene: Secondary | ICD-10-CM | POA: Insufficient documentation

## 2018-05-31 DIAGNOSIS — G620 Drug-induced polyneuropathy: Secondary | ICD-10-CM | POA: Insufficient documentation

## 2018-05-31 DIAGNOSIS — K8020 Calculus of gallbladder without cholecystitis without obstruction: Secondary | ICD-10-CM | POA: Insufficient documentation

## 2018-05-31 DIAGNOSIS — I700 Atherosclerosis of aorta: Secondary | ICD-10-CM | POA: Insufficient documentation

## 2018-05-31 DIAGNOSIS — C561 Malignant neoplasm of right ovary: Secondary | ICD-10-CM | POA: Insufficient documentation

## 2018-05-31 DIAGNOSIS — R918 Other nonspecific abnormal finding of lung field: Secondary | ICD-10-CM | POA: Insufficient documentation

## 2018-05-31 DIAGNOSIS — Z7189 Other specified counseling: Secondary | ICD-10-CM | POA: Diagnosis not present

## 2018-05-31 DIAGNOSIS — I10 Essential (primary) hypertension: Secondary | ICD-10-CM | POA: Insufficient documentation

## 2018-05-31 DIAGNOSIS — Z5112 Encounter for antineoplastic immunotherapy: Secondary | ICD-10-CM | POA: Insufficient documentation

## 2018-05-31 LAB — CBC WITH DIFFERENTIAL (CANCER CENTER ONLY)
BASOS ABS: 0 10*3/uL (ref 0.0–0.1)
BASOS PCT: 1 %
EOS PCT: 2 %
Eosinophils Absolute: 0.1 10*3/uL (ref 0.0–0.5)
HCT: 34.4 % — ABNORMAL LOW (ref 34.8–46.6)
Hemoglobin: 11.5 g/dL — ABNORMAL LOW (ref 11.6–15.9)
Lymphocytes Relative: 24 %
Lymphs Abs: 1 10*3/uL (ref 0.9–3.3)
MCH: 32.5 pg (ref 25.1–34.0)
MCHC: 33.4 g/dL (ref 31.5–36.0)
MCV: 97.2 fL (ref 79.5–101.0)
MONO ABS: 0.7 10*3/uL (ref 0.1–0.9)
Monocytes Relative: 16 %
Neutro Abs: 2.5 10*3/uL (ref 1.5–6.5)
Neutrophils Relative %: 57 %
PLATELETS: 100 10*3/uL — AB (ref 145–400)
RBC: 3.54 MIL/uL — ABNORMAL LOW (ref 3.70–5.45)
RDW: 14.8 % — AB (ref 11.2–14.5)
WBC Count: 4.3 10*3/uL (ref 3.9–10.3)

## 2018-05-31 LAB — COMPREHENSIVE METABOLIC PANEL
ALBUMIN: 4.1 g/dL (ref 3.5–5.0)
ALT: 13 U/L (ref 0–44)
ANION GAP: 7 (ref 5–15)
AST: 20 U/L (ref 15–41)
Alkaline Phosphatase: 60 U/L (ref 38–126)
BUN: 17 mg/dL (ref 8–23)
CO2: 24 mmol/L (ref 22–32)
Calcium: 9.6 mg/dL (ref 8.9–10.3)
Chloride: 104 mmol/L (ref 98–111)
Creatinine, Ser: 0.79 mg/dL (ref 0.44–1.00)
GFR calc Af Amer: >60 mL/min (ref >60)
GFR calc non Af Amer: >60 mL/min (ref >60)
GLUCOSE: 89 mg/dL (ref 70–99)
POTASSIUM: 4.2 mmol/L (ref 3.5–5.1)
Sodium: 135 mmol/L (ref 135–145)
Total Bilirubin: 0.5 mg/dL (ref 0.3–1.2)
Total Protein: 7.2 g/dL (ref 6.5–8.1)

## 2018-05-31 LAB — TOTAL PROTEIN, URINE DIPSTICK: Protein, ur: NEGATIVE mg/dL

## 2018-05-31 LAB — MAGNESIUM: MAGNESIUM: 1.6 mg/dL — AB (ref 1.7–2.4)

## 2018-05-31 MED ORDER — IOPAMIDOL (ISOVUE-300) INJECTION 61%
30.0000 mL | Freq: Once | INTRAVENOUS | Status: AC | PRN
Start: 2018-05-31 — End: 2018-05-31
  Administered 2018-05-31: 30 mL via ORAL

## 2018-05-31 MED ORDER — HEPARIN SOD (PORK) LOCK FLUSH 100 UNIT/ML IV SOLN
INTRAVENOUS | Status: AC
  Administered 2018-05-31: 500 [IU] via INTRAVENOUS
  Filled 2018-05-31: qty 5

## 2018-05-31 MED ORDER — IOPAMIDOL (ISOVUE-300) INJECTION 61%
100.0000 mL | Freq: Once | INTRAVENOUS | Status: AC | PRN
  Administered 2018-05-31: 100 mL via INTRAVENOUS

## 2018-05-31 MED ORDER — HEPARIN SOD (PORK) LOCK FLUSH 100 UNIT/ML IV SOLN
250.0000 [IU] | Freq: Once | INTRAVENOUS | Status: DC
  Filled 2018-05-31: qty 5

## 2018-05-31 MED ORDER — HEPARIN SOD (PORK) LOCK FLUSH 100 UNIT/ML IV SOLN
500.0000 [IU] | Freq: Once | INTRAVENOUS | Status: AC
  Administered 2018-05-31: 500 [IU] via INTRAVENOUS

## 2018-05-31 MED ORDER — IOPAMIDOL (ISOVUE-300) INJECTION 61%
INTRAVENOUS | Status: AC
  Filled 2018-05-31: qty 30

## 2018-05-31 MED ORDER — IOPAMIDOL (ISOVUE-370) INJECTION 76%
INTRAVENOUS | Status: AC
  Filled 2018-05-31: qty 100

## 2018-05-31 MED ORDER — HEPARIN SOD (PORK) LOCK FLUSH 100 UNIT/ML IV SOLN
500.0000 [IU] | Freq: Once | INTRAVENOUS | Status: AC
  Administered 2018-05-31: 500 [IU] via INTRAVENOUS
  Filled 2018-05-31: qty 5

## 2018-05-31 MED ORDER — SODIUM CHLORIDE 0.9% FLUSH
10.0000 mL | Freq: Once | INTRAVENOUS | Status: AC
  Administered 2018-05-31: 10 mL
  Filled 2018-05-31: qty 10

## 2018-06-01 ENCOUNTER — Telehealth: Payer: Self-pay | Admitting: Gynecologic Oncology

## 2018-06-01 ENCOUNTER — Telehealth: Payer: Self-pay

## 2018-06-01 LAB — CA 125: Cancer Antigen (CA) 125: 26 U/mL (ref 0.0–38.1)

## 2018-06-01 NOTE — Telephone Encounter (Signed)
Patient returned call.  Discussed CT scan in detail.  Patient also advised of CA 125 results.  Advised of Dr. Lunette Stands recommendations for Avastin.  All questions answered.  Patient to follow up as scheduled.  She is advised to call for any needs.

## 2018-06-01 NOTE — Telephone Encounter (Signed)
Spoke with pt by phone to confirm appts for 7/15 at 10:15 and 11:45

## 2018-06-01 NOTE — Telephone Encounter (Signed)
Attempted to call patient with CT scan results and Dr. Mora Bellman recommendation to continue with plans for maintenance Avastin with Dr. Alvy Bimler.  Left message for patient advising her to please call the office when available.

## 2018-06-04 ENCOUNTER — Telehealth: Payer: Self-pay | Admitting: Hematology and Oncology

## 2018-06-04 ENCOUNTER — Inpatient Hospital Stay: Payer: Medicare Other

## 2018-06-04 ENCOUNTER — Ambulatory Visit: Payer: Medicare Other | Admitting: Hematology and Oncology

## 2018-06-04 ENCOUNTER — Inpatient Hospital Stay (HOSPITAL_BASED_OUTPATIENT_CLINIC_OR_DEPARTMENT_OTHER): Payer: Medicare Other | Admitting: Hematology and Oncology

## 2018-06-04 ENCOUNTER — Encounter: Payer: Self-pay | Admitting: Hematology and Oncology

## 2018-06-04 VITALS — BP 178/65 | HR 62 | Resp 17

## 2018-06-04 VITALS — BP 151/72 | HR 78 | Temp 98.1°F | Resp 18 | Ht 62.75 in | Wt 146.6 lb

## 2018-06-04 DIAGNOSIS — T63461A Toxic effect of venom of wasps, accidental (unintentional), initial encounter: Secondary | ICD-10-CM | POA: Insufficient documentation

## 2018-06-04 DIAGNOSIS — Z5112 Encounter for antineoplastic immunotherapy: Secondary | ICD-10-CM | POA: Diagnosis not present

## 2018-06-04 DIAGNOSIS — Z7189 Other specified counseling: Secondary | ICD-10-CM | POA: Diagnosis not present

## 2018-06-04 DIAGNOSIS — C561 Malignant neoplasm of right ovary: Secondary | ICD-10-CM

## 2018-06-04 DIAGNOSIS — T451X5A Adverse effect of antineoplastic and immunosuppressive drugs, initial encounter: Secondary | ICD-10-CM

## 2018-06-04 DIAGNOSIS — G620 Drug-induced polyneuropathy: Secondary | ICD-10-CM

## 2018-06-04 DIAGNOSIS — I10 Essential (primary) hypertension: Secondary | ICD-10-CM

## 2018-06-04 MED ORDER — SODIUM CHLORIDE 0.9% FLUSH
10.0000 mL | INTRAVENOUS | Status: DC | PRN
  Administered 2018-06-04: 10 mL
  Filled 2018-06-04: qty 10

## 2018-06-04 MED ORDER — HEPARIN SOD (PORK) LOCK FLUSH 100 UNIT/ML IV SOLN
500.0000 [IU] | Freq: Once | INTRAVENOUS | Status: AC | PRN
  Administered 2018-06-04: 500 [IU]
  Filled 2018-06-04: qty 5

## 2018-06-04 MED ORDER — SODIUM CHLORIDE 0.9 % IV SOLN
15.0000 mg/kg | Freq: Once | INTRAVENOUS | Status: AC
  Administered 2018-06-04: 1000 mg via INTRAVENOUS
  Filled 2018-06-04: qty 8

## 2018-06-04 MED ORDER — SODIUM CHLORIDE 0.9 % IV SOLN
Freq: Once | INTRAVENOUS | Status: AC
  Administered 2018-06-04: 12:00:00 via INTRAVENOUS

## 2018-06-04 NOTE — Assessment & Plan Note (Addendum)
I have reviewed the plan of care with the patient and her husband I have reviewed the CT scan We discussed tumor marker monitoring We discussed the role of maintenance bevacizumab The risk, benefits, side effects of bevacizumab is discussed with the patient and she agreed to proceed I will see her back in 3 weeks before her next dose of treatment I plan to repeat imaging study again in October 2019

## 2018-06-04 NOTE — Assessment & Plan Note (Signed)
She has minimum trace neuropathy from prior treatment It is not significant Recommend observation only

## 2018-06-04 NOTE — Progress Notes (Signed)
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[facility]) I have reviewed the plan of care with the patient and her husband I have reviewed the CT scan We discussed tumor marker monitoring We discussed the role of maintenance bevacizumab The risk, benefits, side effects of bevacizumab is discussed with the patient and she agreed to proceed I will see her back in 3 weeks before her next dose of treatment I plan to repeat imaging study again in October 2019  Peripheral neuropathy due to chemotherapy [facility]) She has minimum trace neuropathy from prior treatment It is not significant Recommend observation only  Essential hypertension We discussed chronic blood pressure medication management While on bevacizumab, I expect that her blood pressure might be elevated The patient is instructed to monitor blood pressure at least twice a day, with goal systolic blood pressure under 741 or diastolic blood pressure under 90 She is not symptomatic   Accidental wasp sting She has mild redness on the palm from recent wasp sting I recommend Claritin along with Benadryl as needed  Goals of care, counseling/discussion We had extensive discussion about goals of care and the role of maintenance treatment with bevacizumab   Orders Placed This Encounter  Procedures  . Comprehensive metabolic panel    Standing Status:   Standing    Number of Occurrences:   22    Standing Expiration Date:   06/05/2019  . CBC with Differential/Platelet    Standing Status:   Standing    Number of Occurrences:   22    Standing Expiration Date:   06/05/2019    INTERVAL HISTORY: Please see below for problem oriented charting. She returns to resume care locally after completion of chemotherapy treatment with chemo desensitizing at [facility] She had repeat blood work and CT imaging last  week She is not symptomatic Denies nausea, changes in bowel habits, bloating or abnormal vaginal bleeding She denies leg swelling Denies dizziness, headache or symptoms of hypertension from treatment She has mild residual peripheral neuropathy from prior treatment, stable and it does not bother her Over the weekend, she suffered from an accidental wasp sting affecting the left palm  she started using some Claritin Her hand is mildly swollen but it does not bother her She brought with her documentation of her blood pressure over the last few weeks  with systolic blood pressure ranges from 118 to 165  SUMMARY OF ONCOLOGIC HISTORY: Oncology History   Neg genetics. Additional tests revealed ER: 80%, PR 3%      Right ovarian epithelial cancer ([place])   07/01/2015 Initial Diagnosis    She presented with postmenopausal bleeding      07/02/2015 Imaging    US pelvis showed bilateral ovarian cyst      07/13/2015 Imaging    5.2 cm left adnexal cystic lesion, with indeterminate but probably benign characteristics. In a postmenopausal female, consider continued annual imaging followup with CT or MRI versus surgical evaluation.  Colonic diverticulosis. No radiographic evidence of diverticulitis.  Cholelithiasis.  No radiographic evidence of cholecystitis.  Small hiatal hernia.      07/30/2015 Pathology Results    Outside pathology showed right ovary with high-grade serous carcinoma, 2 cm in maximum dimension.  The tumor involves serosal surface of the right ovary and adjacent right fallopian tube.  Cervix and endometrium were within normal limits. ER positive Peritoneal washing was  positive.      07/30/2015 Surgery    SHe underwent laparoscopic-assisted total vaginal hysterectomy, bilateral salpingo-oophorectomy      07/30/2015 Pathology Results    PERITONEAL WASHING PELVIC (SPECIMEN 1 OF 1, COLLECTED ON 07/30/2015): MALIGNANT CELLS CONSISTENT WITH HIGH GRADE CARCINOMA      08/13/2015 Tumor  Marker    Patient's tumor was tested for the following markers: CA-125 Results of the tumor marker test revealed 96      08/25/2015 - 12/08/2015 Chemotherapy    The patient had 6 cycles of carboplatin and taxol      09/07/2015 Tumor Marker    Patient's tumor was tested for the following markers: CA-125 Results of the tumor marker test revealed 51      10/05/2015 Tumor Marker    Patient's tumor was tested for the following markers: CA-125 Results of the tumor marker test revealed 29      11/09/2015 Tumor Marker    Patient's tumor was tested for the following markers: CA-125 Results of the tumor marker test revealed 44      12/08/2015 Tumor Marker    Patient's tumor was tested for the following markers: CA-125 Results of the tumor marker test revealed 30      12/15/2015 Genetic Testing    Genetics testing normal by [facility] Breast Ovarian panel 12-15-15      01/11/2016 Imaging    1. The only finding of note are several adjacent peripheral abnormal hypodensities along the anterior superior spleen margin, differential diagnostic considerations including small peripheral interval splenic infarcts, splenic injury with subcapsular a fluid collections, or less likely metastatic disease to the splenic margin. This likely merits observation. 2. Other imaging findings of potential clinical significance: Small type 1 hiatal hernia. Aortoiliac atherosclerotic vascular disease. Lower lumbar spondylosis and degenerative disc disease. Gallstones with potential mild gallbladder wall thickening.      03/07/2016 Tumor Marker    Patient's tumor was tested for the following markers: CA-125 Results of the tumor marker test revealed 24.2      04/06/2016 Imaging    1. No evidence of a ovarian cancer metastasis. 2. No ascites. 3. Post hysterectomy and oophorectomy. 4. Cholelithiasis with several large gallstones      04/06/2016 Tumor Marker    Patient's tumor was tested for the following markers:  CA-125 Results of the tumor marker test revealed 22.8      05/30/2016 Tumor Marker    Patient's tumor was tested for the following markers: CA-125 Results of the tumor marker test revealed 21      09/07/2016 Tumor Marker    Patient's tumor was tested for the following markers: CA-125 Results of the tumor marker test revealed 22.4      11/28/2016 Tumor Marker    Patient's tumor was tested for the following markers: CA-125 Results of the tumor marker test revealed 18.8      02/23/2017 Tumor Marker    Patient's tumor was tested for the following markers: CA-125 Results of the tumor marker test revealed 19.1      06/02/2017 Tumor Marker    Patient's tumor was tested for the following markers: CA-125 Results of the tumor marker test revealed 18.3      08/24/2017 Mammogram    Pt reports mammogram complete      08/28/2017 Tumor Marker    Patient's tumor was tested for the following markers: CA-125 Results of the tumor marker test revealed 21.1      12/01/2017 Tumor Marker    Patient's  tumor was tested for the following markers: CA-125 Results of the tumor marker test revealed 31.2      12/13/2017 Imaging    New 2.7 cm peritoneal soft tissue mass in anterior left lower quadrant, consistent with metastatic disease.  No other sites of metastatic disease identified.  Incidental findings including: Cholelithiasis. Colonic diverticulosis. Tiny hiatal hernia. Aortic atherosclerosis.      01/09/2018 - 05/07/2018 Chemotherapy    The patient had carboplatin and taxol; After 2nd cycle, she developed carboplatin allergy. She received carboplatin desensitization protocol at [facility] for final 4 cycles, completed by 6/17. Avastin was added from 04/16/18 onwards      01/30/2018 Adverse Reaction    Potential carboplatin allergy is suspected. She received half the dose of prescribed carboplatin on cycle 2      05/31/2018 Tumor Marker    Patient's tumor was tested for the following markers:  CA-125 Results of the tumor marker test revealed 26      05/31/2018 Imaging    1. Peritoneal soft tissue nodule identified on the previous study has decreased in the interval the. No progressive findings in the abdomen or pelvis on today's study to suggest disease progression. 2. Tiny pulmonary nodules, likely benign. Attention on follow-up recommended. 3. Cholelithiasis. 4.  Aortic Atherosclerois (ICD10-170.0) 5. Tiny hiatal hernia.        06/04/2018 -  Chemotherapy    The patient is placed on Avastin for maintenance       REVIEW OF SYSTEMS:   Constitutional: Denies fevers, chills or abnormal weight loss Eyes: Denies blurriness of vision Ears, nose, mouth, throat, and face: Denies mucositis or sore throat Respiratory: Denies cough, dyspnea or wheezes Cardiovascular: Denies palpitation, chest discomfort or lower extremity swelling Gastrointestinal:  Denies nausea, heartburn or change in bowel habits Lymphatics: Denies new lymphadenopathy or easy bruising Behavioral/Psych: Mood is stable, no new changes  All other systems were reviewed with the patient and are negative.  I have reviewed the past medical history, past surgical history, social history and family history with the patient and they are unchanged from previous note.  ALLERGIES:  is allergic to carboplatin.  MEDICATIONS:  Current Outpatient Medications  Medication Sig Dispense Refill  . lisinopril (PRINIVIL,ZESTRIL) 30 MG tablet Take 30 mg by mouth daily.    [name] atorvastatin (LIPITOR) 10 MG tablet Take 1 tablet (10 mg total) by mouth daily. 30 tablet 2  . Coenzyme Q10 (CO Q 10) 100 MG CAPS Take 1 capsule by mouth daily.     [name] dexamethasone (DECADRON) 4 MG tablet Take 5 tabs at midnight and 5 tabs at 6 am the morning of chemotherapy, every 3 weeks 60 tablet 0  . Glucosamine-Chondroit-Vit C-Mn (GLUCOSAMINE 1500 COMPLEX PO) Take 1 capsule by mouth 2 (two) times daily.    [name] lidocaine-prilocaine (EMLA) cream Apply to  affected area once 30 g 3  . loratadine (CLARITIN) 10 MG tablet Take 10 mg by mouth daily as needed. Reported on 01/15/2016    . Multiple Vitamin (MULTIVITAMIN) capsule Take 1 capsule by mouth daily.     . Omega-3 Fatty Acids (OMEGA-3 FISH OIL) 300 MG CAPS Take 1 capsule by mouth daily.     . ondansetron (ZOFRAN) 8 MG tablet Take 1 tablet (8 mg total) by mouth 2 (two) times daily as needed for refractory nausea / vomiting. Start on day 3 after chemo. 30 tablet 1  . Polyethyl Glycol-Propyl Glycol (SYSTANE ULTRA OP) Apply 1 drop to eye 2 (two) times daily at 10  AM and 5 PM.    . prochlorperazine (COMPAZINE) 10 MG tablet Take 1 tablet (10 mg total) by mouth every 6 (six) hours as needed (Nausea or vomiting). 60 tablet 1  . vitamin E 400 UNIT capsule Take 400 Units by mouth daily.      No current facility-administered medications for this visit.     PHYSICAL EXAMINATION: ECOG PERFORMANCE STATUS: 1 - Symptomatic but completely ambulatory  Vitals:   06/04/18 1018  BP: (!) 151/72  Pulse: 78  Resp: 18  Temp: 98.1 F (36.7 C)  SpO2: 100%   Filed Weights   06/04/18 1018  Weight: 146 lb 9.6 oz (66.5 kg)    GENERAL:alert, no distress and comfortable SKIN: Noted some redness of her left palm EYES: normal, Conjunctiva are pink and non-injected, sclera clear OROPHARYNX:no exudate, no erythema and lips, buccal mucosa, and tongue normal  NECK: supple, thyroid normal size, non-tender, without nodularity LYMPH:  no palpable lymphadenopathy in the cervical, axillary or inguinal LUNGS: clear to auscultation and percussion with normal breathing effort HEART: regular rate & rhythm and no murmurs and no lower extremity edema ABDOMEN:abdomen soft, non-tender and normal bowel sounds Musculoskeletal:no cyanosis of digits and no clubbing  NEURO: alert & oriented x 3 with fluent speech, no focal motor/sensory deficits  LABORATORY DATA:  I have reviewed the data as listed    Component Value Date/Time    NA 135 05/31/2018 0942   NA 142 11/28/2016 0916   K 4.2 05/31/2018 0942   K 3.9 11/28/2016 0916   CL 104 05/31/2018 0942   CO2 24 05/31/2018 0942   CO2 28 11/28/2016 0916   GLUCOSE 89 05/31/2018 0942   GLUCOSE 78 11/28/2016 0916   BUN 17 05/31/2018 0942   BUN 16.8 11/28/2016 0916   CREATININE 0.79 05/31/2018 0942   CREATININE 0.77 01/30/2018 0814   CREATININE 0.8 11/28/2016 0916   CALCIUM 9.6 05/31/2018 0942   CALCIUM 10.5 (H) 11/28/2016 0916   PROT 7.2 05/31/2018 0942   PROT 7.3 11/28/2016 0916   ALBUMIN 4.1 05/31/2018 0942   ALBUMIN 4.3 11/28/2016 0916   AST 20 05/31/2018 0942   AST 17 01/30/2018 0814   AST 19 11/28/2016 0916   ALT 13 05/31/2018 0942   ALT 18 01/30/2018 0814   ALT 19 11/28/2016 0916   ALKPHOS 60 05/31/2018 0942   ALKPHOS 50 11/28/2016 0916   BILITOT 0.5 05/31/2018 0942   BILITOT 0.5 01/30/2018 0814   BILITOT 0.48 11/28/2016 0916   GFRNONAA >60 05/31/2018 0942   GFRNONAA >60 01/30/2018 0814   GFRAA >60 05/31/2018 0942   GFRAA >60 01/30/2018 0814    No results found for: SPEP, UPEP  Lab Results  Component Value Date   WBC 4.3 05/31/2018   NEUTROABS 2.5 05/31/2018   HGB 11.5 (L) 05/31/2018   HCT 34.4 (L) 05/31/2018   MCV 97.2 05/31/2018   PLT 100 (L) 05/31/2018      Chemistry      Component Value Date/Time   NA 135 05/31/2018 0942   NA 142 11/28/2016 0916   K 4.2 05/31/2018 0942   K 3.9 11/28/2016 0916   CL 104 05/31/2018 0942   CO2 24 05/31/2018 0942   CO2 28 11/28/2016 0916   BUN 17 05/31/2018 0942   BUN 16.8 11/28/2016 0916   CREATININE 0.79 05/31/2018 0942   CREATININE 0.77 01/30/2018 0814   CREATININE 0.8 11/28/2016 0916      Component Value Date/Time   CALCIUM 9.6 05/31/2018 0942  CALCIUM 10.5 (H) 11/28/2016 0916   ALKPHOS 60 05/31/2018 0942   ALKPHOS 50 11/28/2016 0916   AST 20 05/31/2018 0942   AST 17 01/30/2018 0814   AST 19 11/28/2016 0916   ALT 13 05/31/2018 0942   ALT 18 01/30/2018 0814   ALT 19 11/28/2016 0916    BILITOT 0.5 05/31/2018 0942   BILITOT 0.5 01/30/2018 0814   BILITOT 0.48 11/28/2016 0916       RADIOGRAPHIC STUDIES: I have personally reviewed the radiological images as listed and agreed with the findings in the report. Ct Chest W Contrast  Result Date: 05/31/2018 CLINICAL DATA:  Ovarian cancer. EXAM: CT CHEST, ABDOMEN, AND PELVIS WITH CONTRAST TECHNIQUE: Multidetector CT imaging of the chest, abdomen and pelvis was performed following the standard protocol during bolus administration of intravenous contrast. CONTRAST:  185mL ISOVUE-300 IOPAMIDOL (ISOVUE-300) INJECTION 61% COMPARISON:  12/13/2017 abdomen and pelvis CT. FINDINGS: CT CHEST FINDINGS Cardiovascular: The heart size is normal. No substantial pericardial effusion. Coronary artery calcification is evident. Atherosclerotic calcification is noted in the wall of the thoracic aorta. Right Port-A-Cath tip is positioned at the SVC/RA junction. Mediastinum/Nodes: Scattered small mediastinal and hilar lymph nodes are evident without lymphadenopathy. The esophagus has normal imaging features. There is no axillary lymphadenopathy. 8 mm left thyroid nodule, likely not clinically relevant in this individual. The esophagus has normal imaging features. Small hiatal hernia noted. Lungs/Pleura: The central tracheobronchial airways are patent. 6 mm pulmonary nodule at the right lung base (7:115) is stable since prior study. 2 mm posterior left upper lobe nodule (7:35). Several additional tiny peripheral left upper lobe pulmonary nodules are evident. Calcified granuloma noted in the left lower lobe. No suspicious pulmonary nodule or mass. No pleural effusion. Musculoskeletal: No worrisome lytic or sclerotic osseous abnormality. CT ABDOMEN PELVIS FINDINGS Hepatobiliary: No focal abnormality within the liver parenchyma. Multiple stones are seen in the lumen of the gallbladder. No intrahepatic or extrahepatic biliary dilation. Pancreas: No focal mass lesion. No  dilatation of the main duct. No intraparenchymal cyst. No peripancreatic edema. Spleen: No splenomegaly. No focal mass lesion. Adrenals/Urinary Tract: No adrenal hemorrhage or renal injury identified. Bladder is unremarkable. No evidence for hydroureter. The urinary bladder appears normal for the degree of distention. Stomach/Bowel: Tiny hiatal hernia. Stomach otherwise unremarkable. Duodenum is normally positioned as is the ligament of Treitz. No small bowel wall thickening. No small bowel dilatation. The terminal ileum is normal. The appendix is not visualized, but there is no edema or inflammation in the region of the cecum. Diverticular changes are noted in the left colon without evidence of diverticulitis. Vascular/Lymphatic: There is abdominal aortic atherosclerosis without aneurysm. There is no gastrohepatic or hepatoduodenal ligament lymphadenopathy. No intraperitoneal or retroperitoneal lymphadenopathy. No pelvic sidewall lymphadenopathy. Reproductive: Uterus surgically absent.  There is no adnexal mass. Other: Soft tissue lesion anterior left pelvis measured on the prior study at 2.7 x 1.9 cm now measures 1.0 x 0.6 cm (2:92). No new or progressive findings in the omentum, mesentery, or along the peritoneum. Musculoskeletal: Stable sclerotic foci in the left acetabulum. Tiny sclerotic focus right T11 vertebral body is unchanged. No worrisome lytic or sclerotic osseous abnormality. IMPRESSION: 1. Peritoneal soft tissue nodule identified on the previous study has decreased in the interval the. No progressive findings in the abdomen or pelvis on today's study to suggest disease progression. 2. Tiny pulmonary nodules, likely benign. Attention on follow-up recommended. 3. Cholelithiasis. 4.  Aortic Atherosclerois (ICD10-170.0) 5. Tiny hiatal hernia. Electronically Signed   By: Randall Hiss  Tery Sanfilippo M.D.   On: 05/31/2018 15:09   Ct Abdomen Pelvis W Contrast  Result Date: 05/31/2018 CLINICAL DATA:  Ovarian cancer.  EXAM: CT CHEST, ABDOMEN, AND PELVIS WITH CONTRAST TECHNIQUE: Multidetector CT imaging of the chest, abdomen and pelvis was performed following the standard protocol during bolus administration of intravenous contrast. CONTRAST:  155mL ISOVUE-300 IOPAMIDOL (ISOVUE-300) INJECTION 61% COMPARISON:  12/13/2017 abdomen and pelvis CT. FINDINGS: CT CHEST FINDINGS Cardiovascular: The heart size is normal. No substantial pericardial effusion. Coronary artery calcification is evident. Atherosclerotic calcification is noted in the wall of the thoracic aorta. Right Port-A-Cath tip is positioned at the SVC/RA junction. Mediastinum/Nodes: Scattered small mediastinal and hilar lymph nodes are evident without lymphadenopathy. The esophagus has normal imaging features. There is no axillary lymphadenopathy. 8 mm left thyroid nodule, likely not clinically relevant in this individual. The esophagus has normal imaging features. Small hiatal hernia noted. Lungs/Pleura: The central tracheobronchial airways are patent. 6 mm pulmonary nodule at the right lung base (7:115) is stable since prior study. 2 mm posterior left upper lobe nodule (7:35). Several additional tiny peripheral left upper lobe pulmonary nodules are evident. Calcified granuloma noted in the left lower lobe. No suspicious pulmonary nodule or mass. No pleural effusion. Musculoskeletal: No worrisome lytic or sclerotic osseous abnormality. CT ABDOMEN PELVIS FINDINGS Hepatobiliary: No focal abnormality within the liver parenchyma. Multiple stones are seen in the lumen of the gallbladder. No intrahepatic or extrahepatic biliary dilation. Pancreas: No focal mass lesion. No dilatation of the main duct. No intraparenchymal cyst. No peripancreatic edema. Spleen: No splenomegaly. No focal mass lesion. Adrenals/Urinary Tract: No adrenal hemorrhage or renal injury identified. Bladder is unremarkable. No evidence for hydroureter. The urinary bladder appears normal for the degree of  distention. Stomach/Bowel: Tiny hiatal hernia. Stomach otherwise unremarkable. Duodenum is normally positioned as is the ligament of Treitz. No small bowel wall thickening. No small bowel dilatation. The terminal ileum is normal. The appendix is not visualized, but there is no edema or inflammation in the region of the cecum. Diverticular changes are noted in the left colon without evidence of diverticulitis. Vascular/Lymphatic: There is abdominal aortic atherosclerosis without aneurysm. There is no gastrohepatic or hepatoduodenal ligament lymphadenopathy. No intraperitoneal or retroperitoneal lymphadenopathy. No pelvic sidewall lymphadenopathy. Reproductive: Uterus surgically absent.  There is no adnexal mass. Other: Soft tissue lesion anterior left pelvis measured on the prior study at 2.7 x 1.9 cm now measures 1.0 x 0.6 cm (2:92). No new or progressive findings in the omentum, mesentery, or along the peritoneum. Musculoskeletal: Stable sclerotic foci in the left acetabulum. Tiny sclerotic focus right T11 vertebral body is unchanged. No worrisome lytic or sclerotic osseous abnormality. IMPRESSION: 1. Peritoneal soft tissue nodule identified on the previous study has decreased in the interval the. No progressive findings in the abdomen or pelvis on today's study to suggest disease progression. 2. Tiny pulmonary nodules, likely benign. Attention on follow-up recommended. 3. Cholelithiasis. 4.  Aortic Atherosclerois (ICD10-170.0) 5. Tiny hiatal hernia. Electronically Signed   By: Misty Stanley M.D.   On: 05/31/2018 15:09    All questions were answered. The patient knows to call the clinic with any problems, questions or concerns. No barriers to learning was detected.  I spent 30 minutes counseling the patient face to face. The total time spent in the appointment was 40 minutes and more than 50% was on counseling and review of test results  Heath Lark, MD 06/04/2018 11:32 AM

## 2018-06-04 NOTE — Patient Instructions (Signed)
[facility] Discharge Instructions for Patients Receiving Chemotherapy  Today you received the following chemotherapy agents Avastin.   To help prevent nausea and vomiting after your treatment, we encourage you to take your nausea medication as prescribed.    If you develop nausea and vomiting that is not controlled by your nausea medication, call the clinic.   BELOW ARE SYMPTOMS THAT SHOULD BE REPORTED IMMEDIATELY:  *FEVER GREATER THAN 100.5 F  *CHILLS WITH OR WITHOUT FEVER  NAUSEA AND VOMITING THAT IS NOT CONTROLLED WITH YOUR NAUSEA MEDICATION  *UNUSUAL SHORTNESS OF BREATH  *UNUSUAL BRUISING OR BLEEDING  TENDERNESS IN MOUTH AND THROAT WITH OR WITHOUT PRESENCE OF ULCERS  *URINARY PROBLEMS  *BOWEL PROBLEMS  UNUSUAL RASH Items with * indicate a potential emergency and should be followed up as soon as possible.  Feel free to call the clinic should you have any questions or concerns. The clinic phone number is [phone].  Please show the CHEMO ALERT CARD at check-in to the Emergency Department and triage nurse.   

## 2018-06-04 NOTE — Assessment & Plan Note (Signed)
We discussed chronic blood pressure medication management While on bevacizumab, I expect that her blood pressure might be elevated The patient is instructed to monitor blood pressure at least twice a day, with goal systolic blood pressure under 656 or diastolic blood pressure under 90 She is not symptomatic

## 2018-06-04 NOTE — Assessment & Plan Note (Signed)
She has mild redness on the palm from recent wasp sting I recommend Claritin along with Benadryl as needed

## 2018-06-04 NOTE — Assessment & Plan Note (Signed)
We had extensive discussion about goals of care and the role of maintenance treatment with bevacizumab

## 2018-06-04 NOTE — Telephone Encounter (Signed)
Gave avs and calendar ° °

## 2018-06-06 ENCOUNTER — Other Ambulatory Visit: Payer: Medicare Other

## 2018-06-13 ENCOUNTER — Other Ambulatory Visit: Payer: Medicare Other

## 2018-06-20 ENCOUNTER — Other Ambulatory Visit: Payer: Medicare Other

## 2018-06-22 ENCOUNTER — Encounter: Payer: Self-pay | Admitting: Hematology and Oncology

## 2018-06-25 ENCOUNTER — Inpatient Hospital Stay: Payer: Medicare Other

## 2018-06-25 ENCOUNTER — Telehealth: Payer: Self-pay | Admitting: Hematology and Oncology

## 2018-06-25 ENCOUNTER — Inpatient Hospital Stay: Payer: Medicare Other | Attending: Gynecology

## 2018-06-25 ENCOUNTER — Encounter: Payer: Self-pay | Admitting: Hematology and Oncology

## 2018-06-25 ENCOUNTER — Inpatient Hospital Stay: Payer: Medicare Other | Admitting: Hematology and Oncology

## 2018-06-25 VITALS — BP 149/58 | HR 62 | Resp 18

## 2018-06-25 DIAGNOSIS — D61818 Other pancytopenia: Secondary | ICD-10-CM | POA: Diagnosis not present

## 2018-06-25 DIAGNOSIS — C561 Malignant neoplasm of right ovary: Secondary | ICD-10-CM | POA: Insufficient documentation

## 2018-06-25 DIAGNOSIS — Z5112 Encounter for antineoplastic immunotherapy: Secondary | ICD-10-CM | POA: Insufficient documentation

## 2018-06-25 DIAGNOSIS — Z7189 Other specified counseling: Secondary | ICD-10-CM

## 2018-06-25 DIAGNOSIS — R030 Elevated blood-pressure reading, without diagnosis of hypertension: Secondary | ICD-10-CM | POA: Diagnosis not present

## 2018-06-25 DIAGNOSIS — I10 Essential (primary) hypertension: Secondary | ICD-10-CM | POA: Insufficient documentation

## 2018-06-25 LAB — COMPREHENSIVE METABOLIC PANEL
ALBUMIN: 3.9 g/dL (ref 3.5–5.0)
ALK PHOS: 46 U/L (ref 38–126)
ALT: 16 U/L (ref 0–44)
ANION GAP: 8 (ref 5–15)
AST: 20 U/L (ref 15–41)
BILIRUBIN TOTAL: 0.5 mg/dL (ref 0.3–1.2)
BUN: 18 mg/dL (ref 8–23)
CALCIUM: 9.6 mg/dL (ref 8.9–10.3)
CO2: 26 mmol/L (ref 22–32)
CREATININE: 0.87 mg/dL (ref 0.44–1.00)
Chloride: 107 mmol/L (ref 98–111)
GFR calc Af Amer: >60 mL/min (ref >60)
GFR calc non Af Amer: >60 mL/min (ref >60)
GLUCOSE: 98 mg/dL (ref 70–99)
Potassium: 4.4 mmol/L (ref 3.5–5.1)
SODIUM: 141 mmol/L (ref 135–145)
Total Protein: 7 g/dL (ref 6.5–8.1)

## 2018-06-25 LAB — CBC WITH DIFFERENTIAL/PLATELET
BASOS PCT: 1 %
Basophils Absolute: 0 10*3/uL (ref 0.0–0.1)
EOS PCT: 10 %
Eosinophils Absolute: 0.3 10*3/uL (ref 0.0–0.5)
HEMATOCRIT: 34.4 % — AB (ref 34.8–46.6)
Hemoglobin: 11.9 g/dL (ref 11.6–15.9)
Lymphocytes Relative: 34 %
Lymphs Abs: 0.9 10*3/uL (ref 0.9–3.3)
MCH: 33 pg (ref 25.1–34.0)
MCHC: 34.5 g/dL (ref 31.5–36.0)
MCV: 95.8 fL (ref 79.5–101.0)
MONOS PCT: 12 %
Monocytes Absolute: 0.3 10*3/uL (ref 0.1–0.9)
NEUTROS ABS: 1.1 10*3/uL — AB (ref 1.5–6.5)
Neutrophils Relative %: 43 %
PLATELETS: 106 10*3/uL — AB (ref 145–400)
RBC: 3.59 MIL/uL — ABNORMAL LOW (ref 3.70–5.45)
RDW: 13.7 % (ref 11.2–14.5)
WBC: 2.6 10*3/uL — ABNORMAL LOW (ref 3.9–10.3)

## 2018-06-25 LAB — TOTAL PROTEIN, URINE DIPSTICK: Protein, ur: NEGATIVE mg/dL

## 2018-06-25 MED ORDER — SODIUM CHLORIDE 0.9 % IV SOLN
Freq: Once | INTRAVENOUS | Status: AC
  Administered 2018-06-25: 13:00:00 via INTRAVENOUS
  Filled 2018-06-25: qty 250

## 2018-06-25 MED ORDER — BEVACIZUMAB CHEMO INJECTION 400 MG/16ML
15.0000 mg/kg | Freq: Once | INTRAVENOUS | Status: AC
  Administered 2018-06-25: 1000 mg via INTRAVENOUS
  Filled 2018-06-25: qty 8

## 2018-06-25 MED ORDER — SODIUM CHLORIDE 0.9% FLUSH
10.0000 mL | Freq: Once | INTRAVENOUS | Status: AC
  Administered 2018-06-25: 10 mL
  Filled 2018-06-25: qty 10

## 2018-06-25 MED ORDER — HEPARIN SOD (PORK) LOCK FLUSH 100 UNIT/ML IV SOLN
500.0000 [IU] | Freq: Once | INTRAVENOUS | Status: AC | PRN
  Administered 2018-06-25: 500 [IU]
  Filled 2018-06-25: qty 5

## 2018-06-25 MED ORDER — SODIUM CHLORIDE 0.9% FLUSH
10.0000 mL | INTRAVENOUS | Status: DC | PRN
  Administered 2018-06-25: 10 mL
  Filled 2018-06-25: qty 10

## 2018-06-25 NOTE — Patient Instructions (Signed)
[facility] Discharge Instructions for Patients Receiving Chemotherapy  Today you received the following chemotherapy agents Avastin  To help prevent nausea and vomiting after your treatment, we encourage you to take your nausea medication as directed   If you develop nausea and vomiting that is not controlled by your nausea medication, call the clinic.   BELOW ARE SYMPTOMS THAT SHOULD BE REPORTED IMMEDIATELY:  *FEVER GREATER THAN 100.5 F  *CHILLS WITH OR WITHOUT FEVER  NAUSEA AND VOMITING THAT IS NOT CONTROLLED WITH YOUR NAUSEA MEDICATION  *UNUSUAL SHORTNESS OF BREATH  *UNUSUAL BRUISING OR BLEEDING  TENDERNESS IN MOUTH AND THROAT WITH OR WITHOUT PRESENCE OF ULCERS  *URINARY PROBLEMS  *BOWEL PROBLEMS  UNUSUAL RASH Items with * indicate a potential emergency and should be followed up as soon as possible.  Feel free to call the clinic should you have any questions or concerns. The clinic phone number is [phone].  Please show the CHEMO ALERT CARD at check-in to the Emergency Department and triage nurse.   

## 2018-06-25 NOTE — Assessment & Plan Note (Signed)
We discussed tumor marker monitoring, result pending, I will call her with test results We discussed the role of maintenance bevacizumab The risk, benefits, side effects of bevacizumab is discussed with the patient and she agreed to proceed I will see her back in 3 weeks before her next dose of treatment I plan to repeat imaging study again in October 2019

## 2018-06-25 NOTE — Assessment & Plan Note (Signed)
The blood pressure monitoring at home was excellent We will proceed without dose adjustment

## 2018-06-25 NOTE — Assessment & Plan Note (Signed)
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

## 2018-06-25 NOTE — Telephone Encounter (Signed)
No 8/5 los orders.

## 2018-06-25 NOTE — Progress Notes (Signed)
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[facility]) We discussed tumor marker monitoring, result pending, I will call her with test results We discussed the role of maintenance bevacizumab The risk, benefits, side effects of bevacizumab is discussed with the patient and she agreed to proceed I will see her back in 3 weeks before her next dose of treatment I plan to repeat imaging study again in October 2019  Blood pressure elevated without history of HTN The blood pressure monitoring at home was excellent We will proceed without dose adjustment  Pancytopenia, acquired ([place]) 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   INTERVAL HISTORY: Please see below for problem oriented charting. She returns for further follow-up She tolerated recent treatment well Denies abdominal pain, bloating or changes in bowel habits No recent infection, fever or chills The patient denies any recent signs or symptoms of bleeding such as spontaneous epistaxis, hematuria or hematochezia. Her blood pressure readings at home were satisfactory  SUMMARY OF ONCOLOGIC HISTORY: Oncology History   Neg genetics. Additional tests revealed ER: 80%, PR 3%      Right ovarian epithelial cancer ([place])   07/01/2015 Initial Diagnosis    She presented with postmenopausal bleeding      07/02/2015 Imaging    US pelvis showed bilateral ovarian cyst      07/13/2015 Imaging    5.2 cm left adnexal cystic lesion, with indeterminate but probably benign characteristics. In a postmenopausal female, consider continued annual imaging followup with CT or MRI versus surgical evaluation.  Colonic diverticulosis. No radiographic evidence of  diverticulitis.  Cholelithiasis.  No radiographic evidence of cholecystitis.  Small hiatal hernia.      07/30/2015 Pathology Results    Outside pathology showed right ovary with high-grade serous carcinoma, 2 cm in maximum dimension.  The tumor involves serosal surface of the right ovary and adjacent right fallopian tube.  Cervix and endometrium were within normal limits. ER positive Peritoneal washing was positive.      07/30/2015 Surgery    SHe underwent laparoscopic-assisted total vaginal hysterectomy, bilateral salpingo-oophorectomy      07/30/2015 Pathology Results    PERITONEAL WASHING PELVIC (SPECIMEN 1 OF 1, COLLECTED ON 07/30/2015): MALIGNANT CELLS CONSISTENT WITH HIGH GRADE CARCINOMA      08/13/2015 Tumor Marker    Patient's tumor was tested for the following markers: CA-125 Results of the tumor marker test revealed 96      08/25/2015 - 12/08/2015 Chemotherapy    The patient had 6 cycles of carboplatin and taxol      09/07/2015 Tumor Marker    Patient's tumor was tested for the following markers: CA-125 Results of the tumor marker test revealed 51      10/05/2015 Tumor Marker    Patient's tumor was tested for the following markers: CA-125 Results of the tumor marker test revealed 29      11/09/2015 Tumor Marker    Patient's tumor was tested for the following markers: CA-125 Results of the tumor marker test revealed 44      12/08/2015 Tumor Marker    Patient's tumor was tested for the following markers: CA-125 Results of the tumor marker test revealed 30      12/15/2015 Genetic Testing    Genetics  testing normal by [facility] Breast Ovarian panel 12-15-15      01/11/2016 Imaging    1. The only finding of note are several adjacent peripheral abnormal hypodensities along the anterior superior spleen margin, differential diagnostic considerations including small peripheral interval splenic infarcts, splenic injury with subcapsular a fluid collections, or less likely  metastatic disease to the splenic margin. This likely merits observation. 2. Other imaging findings of potential clinical significance: Small type 1 hiatal hernia. Aortoiliac atherosclerotic vascular disease. Lower lumbar spondylosis and degenerative disc disease. Gallstones with potential mild gallbladder wall thickening.      03/07/2016 Tumor Marker    Patient's tumor was tested for the following markers: CA-125 Results of the tumor marker test revealed 24.2      04/06/2016 Imaging    1. No evidence of a ovarian cancer metastasis. 2. No ascites. 3. Post hysterectomy and oophorectomy. 4. Cholelithiasis with several large gallstones      04/06/2016 Tumor Marker    Patient's tumor was tested for the following markers: CA-125 Results of the tumor marker test revealed 22.8      05/30/2016 Tumor Marker    Patient's tumor was tested for the following markers: CA-125 Results of the tumor marker test revealed 21      09/07/2016 Tumor Marker    Patient's tumor was tested for the following markers: CA-125 Results of the tumor marker test revealed 22.4      11/28/2016 Tumor Marker    Patient's tumor was tested for the following markers: CA-125 Results of the tumor marker test revealed 18.8      02/23/2017 Tumor Marker    Patient's tumor was tested for the following markers: CA-125 Results of the tumor marker test revealed 19.1      06/02/2017 Tumor Marker    Patient's tumor was tested for the following markers: CA-125 Results of the tumor marker test revealed 18.3      08/24/2017 Mammogram    Pt reports mammogram complete      08/28/2017 Tumor Marker    Patient's tumor was tested for the following markers: CA-125 Results of the tumor marker test revealed 21.1      12/01/2017 Tumor Marker    Patient's tumor was tested for the following markers: CA-125 Results of the tumor marker test revealed 31.2      12/13/2017 Imaging    New 2.7 cm peritoneal soft tissue mass in anterior left  lower quadrant, consistent with metastatic disease.  No other sites of metastatic disease identified.  Incidental findings including: Cholelithiasis. Colonic diverticulosis. Tiny hiatal hernia. Aortic atherosclerosis.      01/09/2018 - 05/07/2018 Chemotherapy    The patient had carboplatin and taxol; After 2nd cycle, she developed carboplatin allergy. She received carboplatin desensitization protocol at [facility] for final 4 cycles, completed by 6/17. Avastin was added from 04/16/18 onwards      01/30/2018 Adverse Reaction    Potential carboplatin allergy is suspected. She received half the dose of prescribed carboplatin on cycle 2      05/31/2018 Tumor Marker    Patient's tumor was tested for the following markers: CA-125 Results of the tumor marker test revealed 26      05/31/2018 Imaging    1. Peritoneal soft tissue nodule identified on the previous study has decreased in the interval the. No progressive findings in the abdomen or pelvis on today's study to suggest disease progression. 2. Tiny pulmonary nodules, likely benign. Attention on follow-up recommended. 3. Cholelithiasis. 4.  Aortic Atherosclerois (ICD10-170.0)  5. Tiny hiatal hernia.        06/04/2018 -  Chemotherapy    The patient is placed on Avastin for maintenance       REVIEW OF SYSTEMS:   Constitutional: Denies fevers, chills or abnormal weight loss Eyes: Denies blurriness of vision Ears, nose, mouth, throat, and face: Denies mucositis or sore throat Respiratory: Denies cough, dyspnea or wheezes Cardiovascular: Denies palpitation, chest discomfort or lower extremity swelling Gastrointestinal:  Denies nausea, heartburn or change in bowel habits Skin: Denies abnormal skin rashes Lymphatics: Denies new lymphadenopathy or easy bruising Neurological:Denies numbness, tingling or new weaknesses Behavioral/Psych: Mood is stable, no new changes  All other systems were reviewed with the patient and are negative.  I  have reviewed the past medical history, past surgical history, social history and family history with the patient and they are unchanged from previous note.  ALLERGIES:  is allergic to carboplatin.  MEDICATIONS:  Current Outpatient Medications  Medication Sig Dispense Refill  . atorvastatin (LIPITOR) 10 MG tablet Take 1 tablet (10 mg total) by mouth daily. 30 tablet 2  . Coenzyme Q10 (CO Q 10) 100 MG CAPS Take 1 capsule by mouth daily.     . Glucosamine-Chondroit-Vit C-Mn (GLUCOSAMINE 1500 COMPLEX PO) Take 1 capsule by mouth 2 (two) times daily.    [name] lidocaine-prilocaine (EMLA) cream Apply to affected area once 30 g 3  . lisinopril (PRINIVIL,ZESTRIL) 30 MG tablet Take 30 mg by mouth daily.    . Multiple Vitamin (MULTIVITAMIN) capsule Take 1 capsule by mouth daily.     . Omega-3 Fatty Acids (OMEGA-3 FISH OIL) 300 MG CAPS Take 1 capsule by mouth daily.     . ondansetron (ZOFRAN) 8 MG tablet Take 1 tablet (8 mg total) by mouth 2 (two) times daily as needed for refractory nausea / vomiting. Start on day 3 after chemo. 30 tablet 1  . Polyethyl Glycol-Propyl Glycol (SYSTANE ULTRA OP) Apply 1 drop to eye 2 (two) times daily at 10 AM and 5 PM.    . prochlorperazine (COMPAZINE) 10 MG tablet Take 1 tablet (10 mg total) by mouth every 6 (six) hours as needed (Nausea or vomiting). 60 tablet 1  . vitamin E 400 UNIT capsule Take 400 Units by mouth daily.      No current facility-administered medications for this visit.    Facility-Administered Medications Ordered in Other Visits  Medication Dose Route Frequency Provider Last Rate Last Dose  . bevacizumab (AVASTIN) 1,000 mg in sodium chloride 0.9 % 100 mL chemo infusion  15 mg/kg (Treatment Plan Recorded) Intravenous Once Alvy Bimler, Pauleen Goleman, MD 280 mL/hr at 06/25/18 1340 1,000 mg at 06/25/18 1340  . heparin lock flush 100 unit/mL  500 Units Intracatheter Once PRN Alvy Bimler, Yerick Eggebrecht, MD      . sodium chloride flush (NS) 0.9 % injection 10 mL  10 mL Intracatheter PRN  Alvy Bimler, Selinda Korzeniewski, MD        PHYSICAL EXAMINATION: ECOG PERFORMANCE STATUS: 0 - Asymptomatic  Vitals:   06/25/18 1148  BP: (!) 150/84  Pulse: 69  Resp: 18  Temp: 98.1 F (36.7 C)  SpO2: 100%   Filed Weights   06/25/18 1148  Weight: 144 lb 6.4 oz (65.5 kg)    GENERAL:alert, no distress and comfortable SKIN: skin color, texture, turgor are normal, no rashes or significant lesions EYES: normal, Conjunctiva are pink and non-injected, sclera clear OROPHARYNX:no exudate, no erythema and lips, buccal mucosa, and tongue normal  NECK: supple, thyroid normal size, non-tender, without nodularity  LYMPH:  no palpable lymphadenopathy in the cervical, axillary or inguinal LUNGS: clear to auscultation and percussion with normal breathing effort HEART: regular rate & rhythm and no murmurs and no lower extremity edema ABDOMEN:abdomen soft, non-tender and normal bowel sounds Musculoskeletal:no cyanosis of digits and no clubbing  NEURO: alert & oriented x 3 with fluent speech, no focal motor/sensory deficits  LABORATORY DATA:  I have reviewed the data as listed    Component Value Date/Time   NA 141 06/25/2018 1042   NA 142 11/28/2016 0916   K 4.4 06/25/2018 1042   K 3.9 11/28/2016 0916   CL 107 06/25/2018 1042   CO2 26 06/25/2018 1042   CO2 28 11/28/2016 0916   GLUCOSE 98 06/25/2018 1042   GLUCOSE 78 11/28/2016 0916   BUN 18 06/25/2018 1042   BUN 16.8 11/28/2016 0916   CREATININE 0.87 06/25/2018 1042   CREATININE 0.77 01/30/2018 0814   CREATININE 0.8 11/28/2016 0916   CALCIUM 9.6 06/25/2018 1042   CALCIUM 10.5 (H) 11/28/2016 0916   PROT 7.0 06/25/2018 1042   PROT 7.3 11/28/2016 0916   ALBUMIN 3.9 06/25/2018 1042   ALBUMIN 4.3 11/28/2016 0916   AST 20 06/25/2018 1042   AST 17 01/30/2018 0814   AST 19 11/28/2016 0916   ALT 16 06/25/2018 1042   ALT 18 01/30/2018 0814   ALT 19 11/28/2016 0916   ALKPHOS 46 06/25/2018 1042   ALKPHOS 50 11/28/2016 0916   BILITOT 0.5 06/25/2018 1042    BILITOT 0.5 01/30/2018 0814   BILITOT 0.48 11/28/2016 0916   GFRNONAA >60 06/25/2018 1042   GFRNONAA >60 01/30/2018 0814   GFRAA >60 06/25/2018 1042   GFRAA >60 01/30/2018 0814    No results found for: SPEP, UPEP  Lab Results  Component Value Date   WBC 2.6 (L) 06/25/2018   NEUTROABS 1.1 (L) 06/25/2018   HGB 11.9 06/25/2018   HCT 34.4 (L) 06/25/2018   MCV 95.8 06/25/2018   PLT 106 (L) 06/25/2018      Chemistry      Component Value Date/Time   NA 141 06/25/2018 1042   NA 142 11/28/2016 0916   K 4.4 06/25/2018 1042   K 3.9 11/28/2016 0916   CL 107 06/25/2018 1042   CO2 26 06/25/2018 1042   CO2 28 11/28/2016 0916   BUN 18 06/25/2018 1042   BUN 16.8 11/28/2016 0916   CREATININE 0.87 06/25/2018 1042   CREATININE 0.77 01/30/2018 0814   CREATININE 0.8 11/28/2016 0916      Component Value Date/Time   CALCIUM 9.6 06/25/2018 1042   CALCIUM 10.5 (H) 11/28/2016 0916   ALKPHOS 46 06/25/2018 1042   ALKPHOS 50 11/28/2016 0916   AST 20 06/25/2018 1042   AST 17 01/30/2018 0814   AST 19 11/28/2016 0916   ALT 16 06/25/2018 1042   ALT 18 01/30/2018 0814   ALT 19 11/28/2016 0916   BILITOT 0.5 06/25/2018 1042   BILITOT 0.5 01/30/2018 0814   BILITOT 0.48 11/28/2016 0916       RADIOGRAPHIC STUDIES: I have personally reviewed the radiological images as listed and agreed with the findings in the report. Ct Chest W Contrast  Result Date: 05/31/2018 CLINICAL DATA:  Ovarian cancer. EXAM: CT CHEST, ABDOMEN, AND PELVIS WITH CONTRAST TECHNIQUE: Multidetector CT imaging of the chest, abdomen and pelvis was performed following the standard protocol during bolus administration of intravenous contrast. CONTRAST:  162mL ISOVUE-300 IOPAMIDOL (ISOVUE-300) INJECTION 61% COMPARISON:  12/13/2017 abdomen and pelvis CT. FINDINGS: CT CHEST  FINDINGS Cardiovascular: The heart size is normal. No substantial pericardial effusion. Coronary artery calcification is evident. Atherosclerotic calcification is  noted in the wall of the thoracic aorta. Right Port-A-Cath tip is positioned at the SVC/RA junction. Mediastinum/Nodes: Scattered small mediastinal and hilar lymph nodes are evident without lymphadenopathy. The esophagus has normal imaging features. There is no axillary lymphadenopathy. 8 mm left thyroid nodule, likely not clinically relevant in this individual. The esophagus has normal imaging features. Small hiatal hernia noted. Lungs/Pleura: The central tracheobronchial airways are patent. 6 mm pulmonary nodule at the right lung base (7:115) is stable since prior study. 2 mm posterior left upper lobe nodule (7:35). Several additional tiny peripheral left upper lobe pulmonary nodules are evident. Calcified granuloma noted in the left lower lobe. No suspicious pulmonary nodule or mass. No pleural effusion. Musculoskeletal: No worrisome lytic or sclerotic osseous abnormality. CT ABDOMEN PELVIS FINDINGS Hepatobiliary: No focal abnormality within the liver parenchyma. Multiple stones are seen in the lumen of the gallbladder. No intrahepatic or extrahepatic biliary dilation. Pancreas: No focal mass lesion. No dilatation of the main duct. No intraparenchymal cyst. No peripancreatic edema. Spleen: No splenomegaly. No focal mass lesion. Adrenals/Urinary Tract: No adrenal hemorrhage or renal injury identified. Bladder is unremarkable. No evidence for hydroureter. The urinary bladder appears normal for the degree of distention. Stomach/Bowel: Tiny hiatal hernia. Stomach otherwise unremarkable. Duodenum is normally positioned as is the ligament of Treitz. No small bowel wall thickening. No small bowel dilatation. The terminal ileum is normal. The appendix is not visualized, but there is no edema or inflammation in the region of the cecum. Diverticular changes are noted in the left colon without evidence of diverticulitis. Vascular/Lymphatic: There is abdominal aortic atherosclerosis without aneurysm. There is no  gastrohepatic or hepatoduodenal ligament lymphadenopathy. No intraperitoneal or retroperitoneal lymphadenopathy. No pelvic sidewall lymphadenopathy. Reproductive: Uterus surgically absent.  There is no adnexal mass. Other: Soft tissue lesion anterior left pelvis measured on the prior study at 2.7 x 1.9 cm now measures 1.0 x 0.6 cm (2:92). No new or progressive findings in the omentum, mesentery, or along the peritoneum. Musculoskeletal: Stable sclerotic foci in the left acetabulum. Tiny sclerotic focus right T11 vertebral body is unchanged. No worrisome lytic or sclerotic osseous abnormality. IMPRESSION: 1. Peritoneal soft tissue nodule identified on the previous study has decreased in the interval the. No progressive findings in the abdomen or pelvis on today's study to suggest disease progression. 2. Tiny pulmonary nodules, likely benign. Attention on follow-up recommended. 3. Cholelithiasis. 4.  Aortic Atherosclerois (ICD10-170.0) 5. Tiny hiatal hernia. Electronically Signed   By: Misty Stanley M.D.   On: 05/31/2018 15:09   Ct Abdomen Pelvis W Contrast  Result Date: 05/31/2018 CLINICAL DATA:  Ovarian cancer. EXAM: CT CHEST, ABDOMEN, AND PELVIS WITH CONTRAST TECHNIQUE: Multidetector CT imaging of the chest, abdomen and pelvis was performed following the standard protocol during bolus administration of intravenous contrast. CONTRAST:  151mL ISOVUE-300 IOPAMIDOL (ISOVUE-300) INJECTION 61% COMPARISON:  12/13/2017 abdomen and pelvis CT. FINDINGS: CT CHEST FINDINGS Cardiovascular: The heart size is normal. No substantial pericardial effusion. Coronary artery calcification is evident. Atherosclerotic calcification is noted in the wall of the thoracic aorta. Right Port-A-Cath tip is positioned at the SVC/RA junction. Mediastinum/Nodes: Scattered small mediastinal and hilar lymph nodes are evident without lymphadenopathy. The esophagus has normal imaging features. There is no axillary lymphadenopathy. 8 mm left  thyroid nodule, likely not clinically relevant in this individual. The esophagus has normal imaging features. Small hiatal hernia noted. Lungs/Pleura: The central  tracheobronchial airways are patent. 6 mm pulmonary nodule at the right lung base (7:115) is stable since prior study. 2 mm posterior left upper lobe nodule (7:35). Several additional tiny peripheral left upper lobe pulmonary nodules are evident. Calcified granuloma noted in the left lower lobe. No suspicious pulmonary nodule or mass. No pleural effusion. Musculoskeletal: No worrisome lytic or sclerotic osseous abnormality. CT ABDOMEN PELVIS FINDINGS Hepatobiliary: No focal abnormality within the liver parenchyma. Multiple stones are seen in the lumen of the gallbladder. No intrahepatic or extrahepatic biliary dilation. Pancreas: No focal mass lesion. No dilatation of the main duct. No intraparenchymal cyst. No peripancreatic edema. Spleen: No splenomegaly. No focal mass lesion. Adrenals/Urinary Tract: No adrenal hemorrhage or renal injury identified. Bladder is unremarkable. No evidence for hydroureter. The urinary bladder appears normal for the degree of distention. Stomach/Bowel: Tiny hiatal hernia. Stomach otherwise unremarkable. Duodenum is normally positioned as is the ligament of Treitz. No small bowel wall thickening. No small bowel dilatation. The terminal ileum is normal. The appendix is not visualized, but there is no edema or inflammation in the region of the cecum. Diverticular changes are noted in the left colon without evidence of diverticulitis. Vascular/Lymphatic: There is abdominal aortic atherosclerosis without aneurysm. There is no gastrohepatic or hepatoduodenal ligament lymphadenopathy. No intraperitoneal or retroperitoneal lymphadenopathy. No pelvic sidewall lymphadenopathy. Reproductive: Uterus surgically absent.  There is no adnexal mass. Other: Soft tissue lesion anterior left pelvis measured on the prior study at 2.7 x 1.9 cm now  measures 1.0 x 0.6 cm (2:92). No new or progressive findings in the omentum, mesentery, or along the peritoneum. Musculoskeletal: Stable sclerotic foci in the left acetabulum. Tiny sclerotic focus right T11 vertebral body is unchanged. No worrisome lytic or sclerotic osseous abnormality. IMPRESSION: 1. Peritoneal soft tissue nodule identified on the previous study has decreased in the interval the. No progressive findings in the abdomen or pelvis on today's study to suggest disease progression. 2. Tiny pulmonary nodules, likely benign. Attention on follow-up recommended. 3. Cholelithiasis. 4.  Aortic Atherosclerois (ICD10-170.0) 5. Tiny hiatal hernia. Electronically Signed   By: Misty Stanley M.D.   On: 05/31/2018 15:09    All questions were answered. The patient knows to call the clinic with any problems, questions or concerns. No barriers to learning was detected.  I spent 15 minutes counseling the patient face to face. The total time spent in the appointment was 20 minutes and more than 50% was on counseling and review of test results  Heath Lark, MD 06/25/2018 2:02 PM

## 2018-06-26 ENCOUNTER — Telehealth: Payer: Self-pay | Admitting: *Deleted

## 2018-06-26 LAB — CA 125: CANCER ANTIGEN (CA) 125: 22.5 U/mL (ref 0.0–38.1)

## 2018-06-26 NOTE — Telephone Encounter (Signed)
Notified of Ca 125 results

## 2018-06-26 NOTE — Telephone Encounter (Signed)
-----   Message from Heath Lark, MD sent at 06/26/2018  8:44 AM EDT ----- Regarding: CA-125 is better Please let her know ----- Message ----- From: Interface, Lab In [place] Sent: 06/25/2018  10:55 AM To: Heath Lark, MD

## 2018-06-27 ENCOUNTER — Other Ambulatory Visit: Payer: Medicare Other

## 2018-06-29 ENCOUNTER — Encounter: Payer: Self-pay | Admitting: Hematology and Oncology

## 2018-06-29 ENCOUNTER — Encounter (HOSPITAL_COMMUNITY): Payer: Self-pay | Admitting: Emergency Medicine

## 2018-06-29 ENCOUNTER — Emergency Department (HOSPITAL_COMMUNITY)
Admission: EM | Admit: 2018-06-29 | Discharge: 2018-06-29 | Disposition: A | Discharge status: Home / Self Care | Payer: Medicare Other | Attending: Emergency Medicine | Admitting: Emergency Medicine

## 2018-06-29 ENCOUNTER — Other Ambulatory Visit: Payer: Self-pay

## 2018-06-29 DIAGNOSIS — I10 Essential (primary) hypertension: Secondary | ICD-10-CM | POA: Insufficient documentation

## 2018-06-29 DIAGNOSIS — L509 Urticaria, unspecified: Secondary | ICD-10-CM | POA: Diagnosis present

## 2018-06-29 DIAGNOSIS — Z87891 Personal history of nicotine dependence: Secondary | ICD-10-CM | POA: Insufficient documentation

## 2018-06-29 DIAGNOSIS — Z79899 Other long term (current) drug therapy: Secondary | ICD-10-CM | POA: Diagnosis not present

## 2018-06-29 MED ORDER — FAMOTIDINE IN NACL 20-0.9 MG/50ML-% IV SOLN
20.0000 mg | Freq: Once | INTRAVENOUS | Status: AC
  Administered 2018-06-29: 20 mg via INTRAVENOUS
  Filled 2018-06-29: qty 50

## 2018-06-29 MED ORDER — METHYLPREDNISOLONE SODIUM SUCC 125 MG IJ SOLR
125.0000 mg | Freq: Once | INTRAMUSCULAR | Status: AC
  Administered 2018-06-29: 125 mg via INTRAVENOUS
  Filled 2018-06-29: qty 2

## 2018-06-29 MED ORDER — HEPARIN SOD (PORK) LOCK FLUSH 100 UNIT/ML IV SOLN
500.0000 [IU] | Freq: Once | INTRAVENOUS | Status: AC
  Administered 2018-06-29: 500 [IU]
  Filled 2018-06-29: qty 5

## 2018-06-29 MED ORDER — AMLODIPINE BESYLATE 5 MG PO TABS: 5.0000 mg | ORAL_TABLET | Freq: Every day | ORAL | 0 refills | Status: DC

## 2018-06-29 NOTE — Discharge Instructions (Signed)
We are switching her blood pressure medications.  Discontinue the use of lisinopril.  You can start taking amlodipine 5 mg daily. Continue loratadine as needed. Return to ED for worsening symptoms, trouble breathing or trouble swallowing, chest pain, fever.

## 2018-06-29 NOTE — ED Provider Notes (Signed)
Medical screening examination/treatment/procedure(s) were conducted as a shared visit with non-physician practitioner(s) and myself.  I personally evaluated the patient during the encounter.  None 77 year old female presents with acute onset of rash consistent with hives with associated pruritus which began today.  Took Claritin with some relief.  Patient recently started on lisinopril back in April suspect this may be the culprit.  We will have her stop this and place on Norvasc.  She will contact Dr. Alvy Bimler for further medication management   Lacretia Leigh, MD 06/29/18 1353

## 2018-06-29 NOTE — ED Provider Notes (Signed)
[facility] DEPT Provider Note   CSN: [id number] Arrival date & time: 06/29/18  1114     History   Chief Complaint Chief Complaint  Patient presents with  . Urticaria    HPI Emily Hull is a 77 y.o. female with a past medical history of ovarian cancer currently on chemotherapy, who presents to ED for evaluation of urticaria to bilateral upper and lower extremities, torso that began 2 days ago.  She states that she initially had symptoms 2 days ago which improved with out taking any medications.  States that she did not have symptoms yesterday.  She woke up this morning with return of the urticaria.  She took 1 dose of loratadine this morning with improvement in her itching but continues to have the rash.  She cannot recall any inciting event that may have triggered the symptoms.  The only medication allergy that she knows is carboplatin.  She does admit to using a new face wash for the past 2 weeks.  Of note, patient received an infusion of Avastin on Monday, which she has received several times in the past. Denies any chest pain, shortness of breath, lip swelling, hemoptysis, fever.  HPI  Past Medical History:  Diagnosis Date  . Cushing's disease ([place])   . Elevated cholesterol   . Osteopenia   . Ovarian cancer [facility])     Patient Active Problem List   Diagnosis Date Noted  . Pancytopenia, acquired ([place]) 06/25/2018  . Essential hypertension 06/04/2018  . Accidental wasp sting 06/04/2018  . 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 Goals of care, counseling/discussion 12/29/2017  . Thrombocytopenia ([place]) 03/08/2016  . Genetic testing 01/14/2016  . Elevated cholesterol 08/16/2015  . History of Cushing's syndrome 08/16/2015  . Osteopenia determined by x-ray 08/16/2015  . 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 Calculus of gallbladder without  cholecystitis without obstruction 07/23/2015  . H/O endocrine disorder 07/23/2015  . Generalized OA 07/23/2015  . Allergic rhinitis, seasonal 07/23/2015  . Blood pressure elevated without history of HTN 07/23/2015    Past Surgical History:  Procedure Laterality Date  . IR FLUORO GUIDE PORT INSERTION RIGHT  01/03/2018  . IR US GUIDE VASC ACCESS RIGHT  01/03/2018  . VAGINAL HYSTERECTOMY       OB History   None      Home Medications    Prior to Admission medications   Medication Sig Start Date End Date Taking? Authorizing Provider  atorvastatin (LIPITOR) 10 MG tablet Take 1 tablet (10 mg total) by mouth daily. 12/24/15  Yes Livesay, Lennis P, MD  Coenzyme Q10 (CO Q 10) 100 MG CAPS Take 1 capsule by mouth daily.    Yes [provider]  Glucosamine-Chondroit-Vit C-Mn (GLUCOSAMINE 1500 COMPLEX PO) Take 1 capsule by mouth 2 (two) times daily.   Yes [provider]  loratadine (CLARITIN) 10 MG tablet Take 10 mg by mouth daily as needed for allergies (hives).   Yes [provider]  Multiple Vitamin (MULTIVITAMIN) capsule Take 1 capsule by mouth daily.    Yes [provider]  Omega-3 Fatty Acids (OMEGA-3 FISH OIL) 300 MG CAPS Take 1 capsule by mouth daily.    Yes [provider]  ondansetron (ZOFRAN) 8 MG tablet Take 1 tablet (8 mg total) by mouth 2 (two) times daily as needed for refractory nausea / vomiting. Start on day 3 after chemo. 12/29/17  Yes Gorsuch, Ni,  MD  Polyethyl Glycol-Propyl Glycol (SYSTANE ULTRA OP) Apply 1 drop to eye 2 (two) times daily at 10 AM and 5 PM.   Yes [provider]  prochlorperazine (COMPAZINE) 10 MG tablet Take 1 tablet (10 mg total) by mouth every 6 (six) hours as needed (Nausea or vomiting). 12/29/17  Yes Heath Lark, MD  vitamin E 400 UNIT capsule Take 400 Units by mouth every evening.    Yes [provider]  amLODipine (NORVASC) 5 MG tablet Take 1 tablet (5 mg total) by mouth daily. 06/29/18   Delia Heady, PA-C  lidocaine-prilocaine (EMLA) cream Apply to affected area once 12/29/17   Heath Lark, MD    Family History Family History  Problem Relation Age of Onset  . Parkinson's disease Mother 73       w/ dementia  . Heart attack Father        hardening of arteries  . Other Sister 62       hysterectomy due to heavy bleeding; still has ovaries  . Cancer Maternal Grandmother        maybe liver cancer; dx. at least in 40s-50s  . Heart attack Paternal Grandfather 23    Social History Social History   Tobacco Use  . Smoking status: Former Smoker    Packs/day: 0.25    Years: 2.00    Pack years: 0.50    Types: Cigarettes    Last attempt to quit: 11/21/1972    Years since quitting: 45.6  . Smokeless tobacco: Never Used  Substance Use Topics  . Alcohol use: No  . Drug use: No     Allergies   Carboplatin   Review of Systems Review of Systems  Constitutional: Negative for appetite change, chills and fever.  HENT: Negative for ear pain, rhinorrhea, sneezing and sore throat.   Eyes: Negative for photophobia and visual disturbance.  Respiratory: Negative for cough, chest tightness, shortness of breath and wheezing.   Cardiovascular: Negative for chest pain and palpitations.  Gastrointestinal: Negative for abdominal pain, blood in stool, constipation, diarrhea, nausea and vomiting.  Genitourinary: Negative for dysuria, hematuria and urgency.  Musculoskeletal: Negative for myalgias.  Skin: Positive for rash.  Neurological: Negative for dizziness, weakness and light-headedness.     Physical Exam Updated Vital Signs BP (!) 148/69   Pulse 79   Temp 97.9 F (36.6 C) (Oral)   Resp 16   SpO2 100%   Physical Exam  Constitutional: She appears well-developed and well-nourished. No distress.  No signs of angioedema or anaphylaxis.  Speaking complete sentences without difficulty.  Nontoxic-appearing.  HENT:  Head: Normocephalic and atraumatic.  Nose: Nose normal.  Eyes:  Conjunctivae and EOM are normal. Left eye exhibits no discharge. No scleral icterus.  Neck: Normal range of motion. Neck supple.  Cardiovascular: Normal rate, regular rhythm, normal heart sounds and intact distal pulses. Exam reveals no gallop and no friction rub.  No murmur heard. Pulmonary/Chest: Effort normal and breath sounds normal. No respiratory distress.  Abdominal: Soft. Bowel sounds are normal. She exhibits no distension. There is no tenderness. There is no guarding.  Musculoskeletal: Normal range of motion. She exhibits no edema.  Neurological: She is alert. She exhibits normal muscle tone. Coordination normal.  Skin: Skin is warm and dry. Rash noted.  Urticaria noted on torso, groin area, bilateral upper and lower extremities.  Psychiatric: She has a normal mood and affect.  Nursing note and vitals reviewed.    ED Treatments / Results  Labs (all labs  ordered are listed, but only abnormal results are displayed) Labs Reviewed - No data to display  EKG None  Radiology No results found.  Procedures Procedures (including critical care time)  Medications Ordered in ED Medications  heparin lock flush 100 unit/mL (has no administration in time range)  methylPREDNISolone sodium succinate (SOLU-MEDROL) 125 mg/2 mL injection 125 mg (125 mg Intravenous Given 06/29/18 1256)  famotidine (PEPCID) IVPB 20 mg premix (0 mg Intravenous Stopped 06/29/18 1321)     Initial Impression / Assessment and Plan / ED Course  I have reviewed the triage vital signs and the nursing notes.  Pertinent labs & imaging results that were available during my care of the patient were reviewed by me and considered in my medical decision making (see chart for details).     77 year old female with a past medical history of ovarian cancer, presents to ED for evaluation of urticaria bilateral upper and lower extremities, torso that began 2 days ago.  Symptoms improved yesterday but then recurred this morning.   She has had improvement in itching with 1 dose of loratadine taken prior to arrival.  She cannot recall any inciting trigger but does admit to using a new face wash 2 weeks ago.  There has been no recent medication changes or changes to her chemo regimen.  She received an infusion of Avastin on Monday which she has been receiving for the past several weeks.  She has urticaria and noted on bilateral upper and lower extremities, torso.  There are no signs of angioedema or anaphylaxis.  She is speaking complete sentences without difficulty.  Lungs are clear to auscultation bilaterally.  Patient was given 1 dose of Solu-Medrol, Pepcid IV with significant improvement in her symptoms.  Her symptoms could be due to lisinopril.  Will switch to amlodipine 5 mg.  I sent a message to her oncologist who is managing her hypertension and patient states that she will also contact her.  She has had no recurrence of her symptoms and is requesting discharge home.  Will advised to return to ED for any severe worsening symptoms.  Due to her comorbidities, will not discharge with p.o. steroids. Patient discussed with and seen by my attending, Dr. Zenia Resides.  Portions of this note were generated with [employer]. Dictation errors may occur despite best attempts at proofreading.  Final Clinical Impressions(s) / ED Diagnoses   Final diagnoses:  Urticaria    ED Discharge Orders         Ordered    amLODipine (NORVASC) 5 MG tablet  Daily     06/29/18 1455           Delia Heady, PA-C 06/29/18 1459    Lacretia Leigh, MD 06/30/18 [phone]

## 2018-06-29 NOTE — ED Notes (Signed)
Bed: WA02 Expected date:  Expected time:  Means of arrival:  Comments: Hold triage

## 2018-06-29 NOTE — ED Triage Notes (Signed)
Pt c/o hives x 2 days, upper /lower extremities and torso, has taken loratadine 10 mg this morning at 10 am.

## 2018-07-02 ENCOUNTER — Telehealth: Payer: Self-pay | Admitting: *Deleted

## 2018-07-02 NOTE — Telephone Encounter (Signed)
Spoke with Emily Hull. States she continues to have some itching- is going to call PCP today to make an appt.

## 2018-07-04 ENCOUNTER — Other Ambulatory Visit: Payer: Medicare Other

## 2018-07-05 ENCOUNTER — Encounter: Payer: Self-pay | Admitting: Hematology and Oncology

## 2018-07-09 ENCOUNTER — Encounter: Payer: Self-pay | Admitting: Hematology and Oncology

## 2018-07-11 ENCOUNTER — Other Ambulatory Visit: Payer: Medicare Other

## 2018-07-15 ENCOUNTER — Encounter: Payer: Self-pay | Admitting: Hematology and Oncology

## 2018-07-16 ENCOUNTER — Telehealth: Payer: Self-pay | Admitting: Hematology and Oncology

## 2018-07-16 ENCOUNTER — Inpatient Hospital Stay (HOSPITAL_BASED_OUTPATIENT_CLINIC_OR_DEPARTMENT_OTHER): Payer: Medicare Other | Admitting: Hematology and Oncology

## 2018-07-16 ENCOUNTER — Inpatient Hospital Stay: Payer: Medicare Other

## 2018-07-16 VITALS — BP 153/76 | HR 68

## 2018-07-16 VITALS — BP 158/69 | HR 59 | Temp 97.9°F | Resp 17 | Ht 62.75 in | Wt 143.7 lb

## 2018-07-16 DIAGNOSIS — C561 Malignant neoplasm of right ovary: Secondary | ICD-10-CM

## 2018-07-16 DIAGNOSIS — D61818 Other pancytopenia: Secondary | ICD-10-CM

## 2018-07-16 DIAGNOSIS — Z5112 Encounter for antineoplastic immunotherapy: Secondary | ICD-10-CM | POA: Diagnosis not present

## 2018-07-16 DIAGNOSIS — I10 Essential (primary) hypertension: Secondary | ICD-10-CM | POA: Diagnosis not present

## 2018-07-16 DIAGNOSIS — Z7189 Other specified counseling: Secondary | ICD-10-CM

## 2018-07-16 LAB — CBC WITH DIFFERENTIAL/PLATELET
BASOS ABS: 0 10*3/uL (ref 0.0–0.1)
Basophils Relative: 1 %
EOS ABS: 0.1 10*3/uL (ref 0.0–0.5)
Eosinophils Relative: 5 %
HEMATOCRIT: 34.8 % (ref 34.8–46.6)
Hemoglobin: 11.8 g/dL (ref 11.6–15.9)
Lymphocytes Relative: 33 %
Lymphs Abs: 0.9 10*3/uL (ref 0.9–3.3)
MCH: 32.6 pg (ref 25.1–34.0)
MCHC: 34 g/dL (ref 31.5–36.0)
MCV: 95.7 fL (ref 79.5–101.0)
MONO ABS: 0.3 10*3/uL (ref 0.1–0.9)
Monocytes Relative: 12 %
NEUTROS ABS: 1.4 10*3/uL — AB (ref 1.5–6.5)
NEUTROS PCT: 49 %
Platelets: 106 10*3/uL — ABNORMAL LOW (ref 145–400)
RBC: 3.63 MIL/uL — ABNORMAL LOW (ref 3.70–5.45)
RDW: 13 % (ref 11.2–14.5)
WBC: 2.8 10*3/uL — ABNORMAL LOW (ref 3.9–10.3)

## 2018-07-16 LAB — COMPREHENSIVE METABOLIC PANEL
ALT: 13 U/L (ref 0–44)
ANION GAP: 7 (ref 5–15)
AST: 17 U/L (ref 15–41)
Albumin: 3.7 g/dL (ref 3.5–5.0)
Alkaline Phosphatase: 46 U/L (ref 38–126)
BILIRUBIN TOTAL: 0.3 mg/dL (ref 0.3–1.2)
BUN: 19 mg/dL (ref 8–23)
CO2: 27 mmol/L (ref 22–32)
Calcium: 9.4 mg/dL (ref 8.9–10.3)
Chloride: 108 mmol/L (ref 98–111)
Creatinine, Ser: 0.79 mg/dL (ref 0.44–1.00)
Glucose, Bld: 87 mg/dL (ref 70–99)
Potassium: 3.8 mmol/L (ref 3.5–5.1)
Sodium: 142 mmol/L (ref 135–145)
TOTAL PROTEIN: 6.7 g/dL (ref 6.5–8.1)

## 2018-07-16 LAB — TOTAL PROTEIN, URINE DIPSTICK

## 2018-07-16 MED ORDER — SODIUM CHLORIDE 0.9 % IV SOLN
15.0000 mg/kg | Freq: Once | INTRAVENOUS | Status: AC
  Administered 2018-07-16: 1000 mg via INTRAVENOUS
  Filled 2018-07-16: qty 40

## 2018-07-16 MED ORDER — SODIUM CHLORIDE 0.9% FLUSH
10.0000 mL | Freq: Once | INTRAVENOUS | Status: AC
  Administered 2018-07-16: 10 mL
  Filled 2018-07-16: qty 10

## 2018-07-16 MED ORDER — SODIUM CHLORIDE 0.9% FLUSH
10.0000 mL | INTRAVENOUS | Status: DC | PRN
  Administered 2018-07-16: 10 mL
  Filled 2018-07-16: qty 10

## 2018-07-16 MED ORDER — HYDROCHLOROTHIAZIDE 25 MG PO TABS: 25.0000 mg | ORAL_TABLET | Freq: Every day | ORAL | 9 refills | Status: DC

## 2018-07-16 MED ORDER — LISINOPRIL 30 MG PO TABS: 30.0000 mg | ORAL_TABLET | Freq: Every day | ORAL | 11 refills | Status: DC

## 2018-07-16 MED ORDER — HEPARIN SOD (PORK) LOCK FLUSH 100 UNIT/ML IV SOLN
500.0000 [IU] | Freq: Once | INTRAVENOUS | Status: AC | PRN
  Administered 2018-07-16: 500 [IU]
  Filled 2018-07-16: qty 5

## 2018-07-16 MED ORDER — SODIUM CHLORIDE 0.9 % IV SOLN
Freq: Once | INTRAVENOUS | Status: AC
  Administered 2018-07-16: 12:00:00 via INTRAVENOUS
  Filled 2018-07-16: qty 250

## 2018-07-16 NOTE — Progress Notes (Signed)
OK to treat with elevated BP today per MD Alvy Bimler

## 2018-07-16 NOTE — Progress Notes (Signed)
OK to treat with today's lab results per Dr Alvy Bimler

## 2018-07-16 NOTE — Telephone Encounter (Signed)
Per 8/26 los, gave patient avs and calendar. Patient needed mid day appt.

## 2018-07-16 NOTE — Patient Instructions (Signed)
[facility] Discharge Instructions for Patients Receiving Chemotherapy  Today you received the following chemotherapy agents Avastin  To help prevent nausea and vomiting after your treatment, we encourage you to take your nausea medication as directed   If you develop nausea and vomiting that is not controlled by your nausea medication, call the clinic.   BELOW ARE SYMPTOMS THAT SHOULD BE REPORTED IMMEDIATELY:  *FEVER GREATER THAN 100.5 F  *CHILLS WITH OR WITHOUT FEVER  NAUSEA AND VOMITING THAT IS NOT CONTROLLED WITH YOUR NAUSEA MEDICATION  *UNUSUAL SHORTNESS OF BREATH  *UNUSUAL BRUISING OR BLEEDING  TENDERNESS IN MOUTH AND THROAT WITH OR WITHOUT PRESENCE OF ULCERS  *URINARY PROBLEMS  *BOWEL PROBLEMS  UNUSUAL RASH Items with * indicate a potential emergency and should be followed up as soon as possible.  Feel free to call the clinic should you have any questions or concerns. The clinic phone number is [phone].  Please show the CHEMO ALERT CARD at check-in to the Emergency Department and triage nurse.   

## 2018-07-17 ENCOUNTER — Encounter: Payer: Self-pay | Admitting: Hematology and Oncology

## 2018-07-17 NOTE — Assessment & Plan Note (Signed)
On review of her blood pressure monitoring at home, it is clear that her blood pressure control needs to be treated more aggressively I recommend continuing amlodipine and lisinopril and I plan to add hydrochlorothiazide I advised her to take hydrochlorothiazide in the morning, starting with half a pill daily for 3 to 5 days If she does not have significant side effects from the treatment, she can increase to full dose The goal is to get systolic blood pressure less than 935 and diastolic blood pressure less than 90

## 2018-07-17 NOTE — Assessment & Plan Note (Signed)
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

## 2018-07-17 NOTE — Assessment & Plan Note (Signed)
Tumor marker is stable.  She tolerated bevacizumab well except for worsening control of her blood pressure We will proceed with treatment as scheduled along with aggressive blood pressure management I will see her back in 3 weeks before her next dose of treatment I plan to repeat imaging study again in October 2019

## 2018-07-17 NOTE — Progress Notes (Signed)
This is Dr. 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  She tolerated bevacizumab well except for worsening control of her blood pressure We will proceed with treatment as scheduled along with aggressive blood pressure management I will see her back in 3 weeks before her next dose of treatment I plan to repeat imaging study again in October 2019  Essential hypertension On review of her blood pressure monitoring at home, it is clear that her blood pressure control needs to be treated more aggressively I recommend continuing amlodipine and lisinopril and I plan to add hydrochlorothiazide I advised her to take hydrochlorothiazide in the morning, starting with half a pill daily for 3 to 5 days If she does not have significant side effects from the treatment, she can increase to full dose The goal is to get systolic blood pressure less than 619 and diastolic blood pressure less than 90   Pancytopenia, acquired ([place]) 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   INTERVAL HISTORY: Please see below for problem oriented charting. She returns for further follow-up She brought with her a diary of her blood pressure control over the last few weeks Over the last week or so, her systolic blood pressure is trending up She denies headache No chest pain or shortness of breath She was seen recently for hives/skin rash but that has resolved Denies abdominal symptoms such as nausea, changes in bowel habits or bloating  SUMMARY OF ONCOLOGIC HISTORY: Oncology History   Neg genetics. Additional tests revealed ER: 80%, PR 3%      Right ovarian epithelial cancer ([place])   07/01/2015  Initial Diagnosis    She presented with postmenopausal bleeding    07/02/2015 Imaging    US pelvis showed bilateral ovarian cyst    07/13/2015 Imaging    5.2 cm left adnexal cystic lesion, with indeterminate but probably benign characteristics. In a postmenopausal female, consider continued annual imaging followup with CT or MRI versus surgical evaluation.  Colonic diverticulosis. No radiographic evidence of diverticulitis.  Cholelithiasis.  No radiographic evidence of cholecystitis.  Small hiatal hernia.    07/30/2015 Pathology Results    Outside pathology showed right ovary with high-grade serous carcinoma, 2 cm in maximum dimension.  The tumor involves serosal surface of the right ovary and adjacent right fallopian tube.  Cervix and endometrium were within normal limits. ER positive Peritoneal washing was positive.    07/30/2015 Surgery    SHe underwent laparoscopic-assisted total vaginal hysterectomy, bilateral salpingo-oophorectomy    07/30/2015 Pathology Results    PERITONEAL WASHING PELVIC (SPECIMEN 1 OF 1, COLLECTED ON 07/30/2015): MALIGNANT CELLS CONSISTENT WITH HIGH GRADE CARCINOMA    08/13/2015 Tumor Marker    Patient's tumor was tested for the following markers: CA-125 Results of the tumor marker test revealed 96    08/25/2015 - 12/08/2015 Chemotherapy    The patient had 6 cycles of carboplatin and taxol    09/07/2015 Tumor Marker    Patient's tumor was tested for the following markers: CA-125 Results of the tumor marker test revealed 51    10/05/2015 Tumor Marker    Patient's tumor was tested for the following markers: CA-125 Results of the tumor marker test revealed 29  11/09/2015 Tumor Marker    Patient's tumor was tested for the following markers: CA-125 Results of the tumor marker test revealed 44    12/08/2015 Tumor Marker    Patient's tumor was tested for the following markers: CA-125 Results of the tumor marker test revealed 30    12/15/2015 Genetic Testing     Genetics testing normal by [facility] Breast Ovarian panel 12-15-15    01/11/2016 Imaging    1. The only finding of note are several adjacent peripheral abnormal hypodensities along the anterior superior spleen margin, differential diagnostic considerations including small peripheral interval splenic infarcts, splenic injury with subcapsular a fluid collections, or less likely metastatic disease to the splenic margin. This likely merits observation. 2. Other imaging findings of potential clinical significance: Small type 1 hiatal hernia. Aortoiliac atherosclerotic vascular disease. Lower lumbar spondylosis and degenerative disc disease. Gallstones with potential mild gallbladder wall thickening.    03/07/2016 Tumor Marker    Patient's tumor was tested for the following markers: CA-125 Results of the tumor marker test revealed 24.2    04/06/2016 Imaging    1. No evidence of a ovarian cancer metastasis. 2. No ascites. 3. Post hysterectomy and oophorectomy. 4. Cholelithiasis with several large gallstones    04/06/2016 Tumor Marker    Patient's tumor was tested for the following markers: CA-125 Results of the tumor marker test revealed 22.8    05/30/2016 Tumor Marker    Patient's tumor was tested for the following markers: CA-125 Results of the tumor marker test revealed 21    09/07/2016 Tumor Marker    Patient's tumor was tested for the following markers: CA-125 Results of the tumor marker test revealed 22.4    11/28/2016 Tumor Marker    Patient's tumor was tested for the following markers: CA-125 Results of the tumor marker test revealed 18.8    02/23/2017 Tumor Marker    Patient's tumor was tested for the following markers: CA-125 Results of the tumor marker test revealed 19.1    06/02/2017 Tumor Marker    Patient's tumor was tested for the following markers: CA-125 Results of the tumor marker test revealed 18.3    08/24/2017 Mammogram    Pt reports mammogram complete    08/28/2017 Tumor  Marker    Patient's tumor was tested for the following markers: CA-125 Results of the tumor marker test revealed 21.1    12/01/2017 Tumor Marker    Patient's tumor was tested for the following markers: CA-125 Results of the tumor marker test revealed 31.2    12/13/2017 Imaging    New 2.7 cm peritoneal soft tissue mass in anterior left lower quadrant, consistent with metastatic disease.  No other sites of metastatic disease identified.  Incidental findings including: Cholelithiasis. Colonic diverticulosis. Tiny hiatal hernia. Aortic atherosclerosis.    01/09/2018 - 05/07/2018 Chemotherapy    The patient had carboplatin and taxol; After 2nd cycle, she developed carboplatin allergy. She received carboplatin desensitization protocol [facility] for final 4 cycles, completed by 6/17. Avastin was added from 04/16/18 onwards    01/30/2018 Adverse Reaction    Potential carboplatin allergy is suspected. She received half the dose of prescribed carboplatin on cycle 2    05/31/2018 Tumor Marker    Patient's tumor was tested for the following markers: CA-125 Results of the tumor marker test revealed 26    05/31/2018 Imaging    1. Peritoneal soft tissue nodule identified on the previous study has decreased in the interval the. No progressive findings in the abdomen or  pelvis on today's study to suggest disease progression. 2. Tiny pulmonary nodules, likely benign. Attention on follow-up recommended. 3. Cholelithiasis. 4.  Aortic Atherosclerois (ICD10-170.0) 5. Tiny hiatal hernia.      06/04/2018 -  Chemotherapy    The patient is placed on Avastin for maintenance    06/25/2018 Tumor Marker    Patient's tumor was tested for the following markers: CA-125 Results of the tumor marker test revealed 22.5     REVIEW OF SYSTEMS:   Constitutional: Denies fevers, chills or abnormal weight loss Eyes: Denies blurriness of vision Ears, nose, mouth, throat, and face: Denies mucositis or sore  throat Respiratory: Denies cough, dyspnea or wheezes Cardiovascular: Denies palpitation, chest discomfort or lower extremity swelling Gastrointestinal:  Denies nausea, heartburn or change in bowel habits Skin: Denies abnormal skin rashes Lymphatics: Denies new lymphadenopathy or easy bruising Neurological:Denies numbness, tingling or new weaknesses Behavioral/Psych: Mood is stable, no new changes  All other systems were reviewed with the patient and are negative.  I have reviewed the past medical history, past surgical history, social history and family history with the patient and they are unchanged from previous note.  ALLERGIES:  is allergic to carboplatin.  MEDICATIONS:  Current Outpatient Medications  Medication Sig Dispense Refill  . lisinopril (PRINIVIL,ZESTRIL) 30 MG tablet Take 1 tablet (30 mg total) by mouth daily. 30 tablet 11  . amLODipine (NORVASC) 5 MG tablet Take 1 tablet (5 mg total) by mouth daily. 30 tablet 0  . atorvastatin (LIPITOR) 10 MG tablet Take 1 tablet (10 mg total) by mouth daily. 30 tablet 2  . Coenzyme Q10 (CO Q 10) 100 MG CAPS Take 1 capsule by mouth daily.     . Glucosamine-Chondroit-Vit C-Mn (GLUCOSAMINE 1500 COMPLEX PO) Take 1 capsule by mouth 2 (two) times daily.    . hydrochlorothiazide (HYDRODIURIL) 25 MG tablet Take 1 tablet (25 mg total) by mouth daily. 30 tablet 9  . lidocaine-prilocaine (EMLA) cream Apply to affected area once 30 g 3  . loratadine (CLARITIN) 10 MG tablet Take 10 mg by mouth daily as needed for allergies (hives).    . Multiple Vitamin (MULTIVITAMIN) capsule Take 1 capsule by mouth daily.     . Omega-3 Fatty Acids (OMEGA-3 FISH OIL) 300 MG CAPS Take 1 capsule by mouth daily.     . ondansetron (ZOFRAN) 8 MG tablet Take 1 tablet (8 mg total) by mouth 2 (two) times daily as needed for refractory nausea / vomiting. Start on day 3 after chemo. 30 tablet 1  . Polyethyl Glycol-Propyl Glycol (SYSTANE ULTRA OP) Apply 1 drop to eye 2 (two)  times daily at 10 AM and 5 PM.    . prochlorperazine (COMPAZINE) 10 MG tablet Take 1 tablet (10 mg total) by mouth every 6 (six) hours as needed (Nausea or vomiting). 60 tablet 1  . vitamin E 400 UNIT capsule Take 400 Units by mouth every evening.      No current facility-administered medications for this visit.     PHYSICAL EXAMINATION: ECOG PERFORMANCE STATUS: 1 - Symptomatic but completely ambulatory  Vitals:   07/16/18 1042  BP: (!) 158/69  Pulse: (!) 59  Resp: 17  Temp: 97.9 F (36.6 C)  SpO2: 100%   Filed Weights   07/16/18 1042  Weight: 143 lb 11.2 oz (65.2 kg)    GENERAL:alert, no distress and comfortable SKIN: skin color, texture, turgor are normal, no rashes or significant lesions EYES: normal, Conjunctiva are pink and non-injected, sclera clear OROPHARYNX:no exudate, no  erythema and lips, buccal mucosa, and tongue normal  NECK: supple, thyroid normal size, non-tender, without nodularity LYMPH:  no palpable lymphadenopathy in the cervical, axillary or inguinal LUNGS: clear to auscultation and percussion with normal breathing effort HEART: regular rate & rhythm and no murmurs and no lower extremity edema ABDOMEN:abdomen soft, non-tender and normal bowel sounds Musculoskeletal:no cyanosis of digits and no clubbing  NEURO: alert & oriented x 3 with fluent speech, no focal motor/sensory deficits  LABORATORY DATA:  I have reviewed the data as listed    Component Value Date/Time   NA 142 07/16/2018 0904   NA 142 11/28/2016 0916   K 3.8 07/16/2018 0904   K 3.9 11/28/2016 0916   CL 108 07/16/2018 0904   CO2 27 07/16/2018 0904   CO2 28 11/28/2016 0916   GLUCOSE 87 07/16/2018 0904   GLUCOSE 78 11/28/2016 0916   BUN 19 07/16/2018 0904   BUN 16.8 11/28/2016 0916   CREATININE 0.79 07/16/2018 0904   CREATININE 0.77 01/30/2018 0814   CREATININE 0.8 11/28/2016 0916   CALCIUM 9.4 07/16/2018 0904   CALCIUM 10.5 (H) 11/28/2016 0916   PROT 6.7 07/16/2018 0904   PROT 7.3  11/28/2016 0916   ALBUMIN 3.7 07/16/2018 0904   ALBUMIN 4.3 11/28/2016 0916   AST 17 07/16/2018 0904   AST 17 01/30/2018 0814   AST 19 11/28/2016 0916   ALT 13 07/16/2018 0904   ALT 18 01/30/2018 0814   ALT 19 11/28/2016 0916   ALKPHOS 46 07/16/2018 0904   ALKPHOS 50 11/28/2016 0916   BILITOT 0.3 07/16/2018 0904   BILITOT 0.5 01/30/2018 0814   BILITOT 0.48 11/28/2016 0916   GFRNONAA >60 07/16/2018 0904   GFRNONAA >60 01/30/2018 0814   GFRAA >60 07/16/2018 0904   GFRAA >60 01/30/2018 0814    No results found for: SPEP, UPEP  Lab Results  Component Value Date   WBC 2.8 (L) 07/16/2018   NEUTROABS 1.4 (L) 07/16/2018   HGB 11.8 07/16/2018   HCT 34.8 07/16/2018   MCV 95.7 07/16/2018   PLT 106 (L) 07/16/2018      Chemistry      Component Value Date/Time   NA 142 07/16/2018 0904   NA 142 11/28/2016 0916   K 3.8 07/16/2018 0904   K 3.9 11/28/2016 0916   CL 108 07/16/2018 0904   CO2 27 07/16/2018 0904   CO2 28 11/28/2016 0916   BUN 19 07/16/2018 0904   BUN 16.8 11/28/2016 0916   CREATININE 0.79 07/16/2018 0904   CREATININE 0.77 01/30/2018 0814   CREATININE 0.8 11/28/2016 0916      Component Value Date/Time   CALCIUM 9.4 07/16/2018 0904   CALCIUM 10.5 (H) 11/28/2016 0916   ALKPHOS 46 07/16/2018 0904   ALKPHOS 50 11/28/2016 0916   AST 17 07/16/2018 0904   AST 17 01/30/2018 0814   AST 19 11/28/2016 0916   ALT 13 07/16/2018 0904   ALT 18 01/30/2018 0814   ALT 19 11/28/2016 0916   BILITOT 0.3 07/16/2018 0904   BILITOT 0.5 01/30/2018 0814   BILITOT 0.48 11/28/2016 0916      All questions were answered. The patient knows to call the clinic with any problems, questions or concerns. No barriers to learning was detected.  I spent 15 minutes counseling the patient face to face. The total time spent in the appointment was 20 minutes and more than 50% was on counseling and review of test results  Heath Lark, MD 07/17/2018 3:26 PM

## 2018-07-31 ENCOUNTER — Encounter: Payer: Self-pay | Admitting: Hematology and Oncology

## 2018-08-03 ENCOUNTER — Other Ambulatory Visit: Payer: Self-pay | Admitting: *Deleted

## 2018-08-03 DIAGNOSIS — C561 Malignant neoplasm of right ovary: Secondary | ICD-10-CM

## 2018-08-06 ENCOUNTER — Inpatient Hospital Stay: Payer: Medicare Other

## 2018-08-06 ENCOUNTER — Inpatient Hospital Stay: Payer: Medicare Other | Attending: Gynecology

## 2018-08-06 ENCOUNTER — Inpatient Hospital Stay (HOSPITAL_BASED_OUTPATIENT_CLINIC_OR_DEPARTMENT_OTHER): Payer: Medicare Other | Admitting: Hematology and Oncology

## 2018-08-06 VITALS — BP 125/49 | HR 64

## 2018-08-06 DIAGNOSIS — I10 Essential (primary) hypertension: Secondary | ICD-10-CM

## 2018-08-06 DIAGNOSIS — Z5112 Encounter for antineoplastic immunotherapy: Secondary | ICD-10-CM | POA: Insufficient documentation

## 2018-08-06 DIAGNOSIS — C561 Malignant neoplasm of right ovary: Secondary | ICD-10-CM | POA: Diagnosis not present

## 2018-08-06 DIAGNOSIS — D61818 Other pancytopenia: Secondary | ICD-10-CM

## 2018-08-06 DIAGNOSIS — Z79899 Other long term (current) drug therapy: Secondary | ICD-10-CM | POA: Diagnosis not present

## 2018-08-06 DIAGNOSIS — Z7189 Other specified counseling: Secondary | ICD-10-CM

## 2018-08-06 LAB — COMPREHENSIVE METABOLIC PANEL
ALT: 13 U/L (ref 0–44)
ANION GAP: 9 (ref 5–15)
AST: 21 U/L (ref 15–41)
Albumin: 4.3 g/dL (ref 3.5–5.0)
Alkaline Phosphatase: 45 U/L (ref 38–126)
BUN: 23 mg/dL (ref 8–23)
CHLORIDE: 101 mmol/L (ref 98–111)
CO2: 25 mmol/L (ref 22–32)
CREATININE: 0.93 mg/dL (ref 0.44–1.00)
Calcium: 10.2 mg/dL (ref 8.9–10.3)
GFR, EST NON AFRICAN AMERICAN: 58 mL/min — AB (ref >60)
Glucose, Bld: 89 mg/dL (ref 70–99)
POTASSIUM: 4.6 mmol/L (ref 3.5–5.1)
SODIUM: 135 mmol/L (ref 135–145)
Total Bilirubin: 0.5 mg/dL (ref 0.3–1.2)
Total Protein: 7.5 g/dL (ref 6.5–8.1)

## 2018-08-06 LAB — CBC WITH DIFFERENTIAL/PLATELET
BASOS PCT: 2 %
Basophils Absolute: 0.1 10*3/uL (ref 0.0–0.1)
EOS ABS: 0.1 10*3/uL (ref 0.0–0.5)
EOS PCT: 4 %
HCT: 37 % (ref 34.8–46.6)
Hemoglobin: 12.5 g/dL (ref 11.6–15.9)
LYMPHS ABS: 1 10*3/uL (ref 0.9–3.3)
Lymphocytes Relative: 30 %
MCH: 31.8 pg (ref 25.1–34.0)
MCHC: 33.9 g/dL (ref 31.5–36.0)
MCV: 93.8 fL (ref 79.5–101.0)
MONOS PCT: 9 %
Monocytes Absolute: 0.3 10*3/uL (ref 0.1–0.9)
Neutro Abs: 1.9 10*3/uL (ref 1.5–6.5)
Neutrophils Relative %: 55 %
PLATELETS: 119 10*3/uL — AB (ref 145–400)
RBC: 3.94 MIL/uL (ref 3.70–5.45)
RDW: 12.8 % (ref 11.2–14.5)
WBC: 3.4 10*3/uL — AB (ref 3.9–10.3)

## 2018-08-06 LAB — TOTAL PROTEIN, URINE DIPSTICK: PROTEIN: NEGATIVE mg/dL

## 2018-08-06 MED ORDER — SODIUM CHLORIDE 0.9 % IV SOLN
Freq: Once | INTRAVENOUS | Status: AC
  Administered 2018-08-06: 14:00:00 via INTRAVENOUS
  Filled 2018-08-06: qty 250

## 2018-08-06 MED ORDER — SODIUM CHLORIDE 0.9 % IV SOLN
15.0000 mg/kg | Freq: Once | INTRAVENOUS | Status: AC
  Administered 2018-08-06: 1000 mg via INTRAVENOUS
  Filled 2018-08-06: qty 32

## 2018-08-06 MED ORDER — HEPARIN SOD (PORK) LOCK FLUSH 100 UNIT/ML IV SOLN
500.0000 [IU] | Freq: Once | INTRAVENOUS | Status: AC | PRN
  Administered 2018-08-06: 500 [IU]
  Filled 2018-08-06: qty 5

## 2018-08-06 MED ORDER — SODIUM CHLORIDE 0.9 % IJ SOLN
10.0000 mL | Freq: Once | INTRAMUSCULAR | Status: AC
  Administered 2018-08-06: 10 mL
  Filled 2018-08-06: qty 10

## 2018-08-06 MED ORDER — SODIUM CHLORIDE 0.9% FLUSH
10.0000 mL | INTRAVENOUS | Status: DC | PRN
  Administered 2018-08-06: 10 mL
  Filled 2018-08-06: qty 10

## 2018-08-06 NOTE — Patient Instructions (Signed)
[facility] Discharge Instructions for Patients Receiving Chemotherapy  Today you received the following chemotherapy agents avastin  To help prevent nausea and vomiting after your treatment, we encourage you to take your nausea medication as directed.   If you develop nausea and vomiting that is not controlled by your nausea medication, call the clinic.   BELOW ARE SYMPTOMS THAT SHOULD BE REPORTED IMMEDIATELY:  *FEVER GREATER THAN 100.5 F  *CHILLS WITH OR WITHOUT FEVER  NAUSEA AND VOMITING THAT IS NOT CONTROLLED WITH YOUR NAUSEA MEDICATION  *UNUSUAL SHORTNESS OF BREATH  *UNUSUAL BRUISING OR BLEEDING  TENDERNESS IN MOUTH AND THROAT WITH OR WITHOUT PRESENCE OF ULCERS  *URINARY PROBLEMS  *BOWEL PROBLEMS  UNUSUAL RASH Items with * indicate a potential emergency and should be followed up as soon as possible.  Feel free to call the clinic should you have any questions or concerns. The clinic phone number is [phone].  Please show the CHEMO ALERT CARD at check-in to the Emergency Department and triage nurse.   

## 2018-08-07 ENCOUNTER — Encounter: Payer: Self-pay | Admitting: Hematology and Oncology

## 2018-08-07 LAB — CA 125: CANCER ANTIGEN (CA) 125: 20.7 U/mL (ref 0.0–38.1)

## 2018-08-07 NOTE — Assessment & Plan Note (Signed)
Clinically, she is doing well and has not experienced any signs or symptoms of cancer recurrence Her tumor marker is stable I do not plan to repeat imaging study until end of October and that she have new symptoms.  She agreed with the plan of care.

## 2018-08-07 NOTE — Progress Notes (Signed)
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  She agreed with the plan of care.  Essential hypertension Her blood pressure is improved with additional blood pressure medication She will continue close monitoring at home.  Pancytopenia, acquired ([place]) Pancytopenia is improving She is not symptomatic Observe only.  I recommend influenza vaccination   No orders of the defined types were placed in this encounter.   INTERVAL HISTORY: Please see below for problem oriented charting. She returns for further follow-up She brought with her copies of her blood pressure monitoring at home Systolic blood pressure highest measured was around 140 She is not symptomatic She denies nausea, vomiting, bloating or changes in bowel habits Denies recent infection, fever or chills  SUMMARY OF ONCOLOGIC HISTORY: Oncology History   Neg genetics. Additional tests revealed ER: 80%, PR 3%      Right ovarian epithelial cancer ([place])   07/01/2015 Initial Diagnosis    She presented with postmenopausal bleeding    07/02/2015 Imaging    US pelvis showed bilateral ovarian cyst    07/13/2015 Imaging    5.2 cm left adnexal cystic lesion, with indeterminate but probably benign characteristics. In a postmenopausal female, consider continued annual imaging followup with CT or MRI versus surgical evaluation.  Colonic diverticulosis. No radiographic evidence of diverticulitis.  Cholelithiasis.  No radiographic evidence of cholecystitis.  Small hiatal hernia.    07/30/2015 Pathology Results    Outside pathology showed right ovary with high-grade serous  carcinoma, 2 cm in maximum dimension.  The tumor involves serosal surface of the right ovary and adjacent right fallopian tube.  Cervix and endometrium were within normal limits. ER positive Peritoneal washing was positive.    07/30/2015 Surgery    SHe underwent laparoscopic-assisted total vaginal hysterectomy, bilateral salpingo-oophorectomy    07/30/2015 Pathology Results    PERITONEAL WASHING PELVIC (SPECIMEN 1 OF 1, COLLECTED ON 07/30/2015): MALIGNANT CELLS CONSISTENT WITH HIGH GRADE CARCINOMA    08/13/2015 Tumor Marker    Patient's tumor was tested for the following markers: CA-125 Results of the tumor marker test revealed 96    08/25/2015 - 12/08/2015 Chemotherapy    The patient had 6 cycles of carboplatin and taxol    09/07/2015 Tumor Marker    Patient's tumor was tested for the following markers: CA-125 Results of the tumor marker test revealed 51    10/05/2015 Tumor Marker    Patient's tumor was tested for the following markers: CA-125 Results of the tumor marker test revealed 29    11/09/2015 Tumor Marker    Patient's tumor was tested for the following markers: CA-125 Results of the tumor marker test revealed 44    12/08/2015 Tumor Marker    Patient's tumor was tested for the following markers: CA-125 Results of the tumor marker test revealed 30    12/15/2015 Genetic Testing    Genetics testing normal by [facility] Breast Ovarian panel 12-15-15    01/11/2016 Imaging    1. The only finding of note are several adjacent peripheral abnormal hypodensities along the anterior superior spleen margin, differential diagnostic considerations including small peripheral interval splenic infarcts, splenic injury with subcapsular a fluid collections,  or less likely metastatic disease to the splenic margin. This likely merits observation. 2. Other imaging findings of potential clinical significance: Small type 1 hiatal hernia. Aortoiliac atherosclerotic vascular disease. 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 Gallstones with potential mild gallbladder wall thickening.    03/07/2016 Tumor Marker    Patient's tumor was tested for the following markers: CA-125 Results of the tumor marker test revealed 24.2    04/06/2016 Imaging    1. No evidence of a ovarian cancer metastasis. 2. No ascites. 3. Post hysterectomy and oophorectomy. 4. Cholelithiasis with several large gallstones    04/06/2016 Tumor Marker    Patient's tumor was tested for the following markers: CA-125 Results of the tumor marker test revealed 22.8    05/30/2016 Tumor Marker    Patient's tumor was tested for the following markers: CA-125 Results of the tumor marker test revealed 21    09/07/2016 Tumor Marker    Patient's tumor was tested for the following markers: CA-125 Results of the tumor marker test revealed 22.4    11/28/2016 Tumor Marker    Patient's tumor was tested for the following markers: CA-125 Results of the tumor marker test revealed 18.8    02/23/2017 Tumor Marker    Patient's tumor was tested for the following markers: CA-125 Results of the tumor marker test revealed 19.1    06/02/2017 Tumor Marker    Patient's tumor was tested for the following markers: CA-125 Results of the tumor marker test revealed 18.3    08/24/2017 Mammogram    Pt reports mammogram complete    08/28/2017 Tumor Marker    Patient's tumor was tested for the following markers: CA-125 Results of the tumor marker test revealed 21.1    12/01/2017 Tumor Marker    Patient's tumor was tested for the following markers: CA-125 Results of the tumor marker test revealed 31.2    12/13/2017 Imaging    New 2.7 cm peritoneal soft tissue mass in anterior left lower quadrant, consistent with metastatic disease.  No other sites of metastatic disease identified.  Incidental findings including: Cholelithiasis. Colonic diverticulosis. Tiny hiatal hernia. Aortic atherosclerosis.    01/09/2018 - 05/07/2018 Chemotherapy    The  patient had carboplatin and taxol; After 2nd cycle, she developed carboplatin allergy. She received carboplatin desensitization protocol at [facility] for final 4 cycles, completed by 6/17. Avastin was added from 04/16/18 onwards    01/30/2018 Adverse Reaction    Potential carboplatin allergy is suspected. She received half the dose of prescribed carboplatin on cycle 2    05/31/2018 Tumor Marker    Patient's tumor was tested for the following markers: CA-125 Results of the tumor marker test revealed 26    05/31/2018 Imaging    1. Peritoneal soft tissue nodule identified on the previous study has decreased in the interval the. No progressive findings in the abdomen or pelvis on today's study to suggest disease progression. 2. Tiny pulmonary nodules, likely benign. Attention on follow-up recommended. 3. Cholelithiasis. 4.  Aortic Atherosclerois (ICD10-170.0) 5. Tiny hiatal hernia.      06/04/2018 -  Chemotherapy    The patient is placed on Avastin for maintenance    06/25/2018 Tumor Marker    Patient's tumor was tested for the following markers: CA-125 Results of the tumor marker test revealed 22.5    08/06/2018 Tumor Marker    Patient's tumor was tested for the following markers: CA-125 Results of the tumor marker test revealed 20.7     REVIEW OF SYSTEMS:  Constitutional: Denies fevers, chills or abnormal weight loss Eyes: Denies blurriness of vision Ears, nose, mouth, throat, and face: Denies mucositis or sore throat Respiratory: Denies cough, dyspnea or wheezes Cardiovascular: Denies palpitation, chest discomfort or lower extremity swelling Gastrointestinal:  Denies nausea, heartburn or change in bowel habits Skin: Denies abnormal skin rashes Lymphatics: Denies new lymphadenopathy or easy bruising Neurological:Denies numbness, tingling or new weaknesses Behavioral/Psych: Mood is stable, no new changes  All other systems were reviewed with the patient and are negative.  I have  reviewed the past medical history, past surgical history, social history and family history with the patient and they are unchanged from previous note.  ALLERGIES:  is allergic to carboplatin.  MEDICATIONS:  Current Outpatient Medications  Medication Sig Dispense Refill  . amLODipine (NORVASC) 5 MG tablet Take 1 tablet (5 mg total) by mouth daily. 30 tablet 0  . atorvastatin (LIPITOR) 10 MG tablet Take 1 tablet (10 mg total) by mouth daily. 30 tablet 2  . Coenzyme Q10 (CO Q 10) 100 MG CAPS Take 1 capsule by mouth daily.     . Glucosamine-Chondroit-Vit C-Mn (GLUCOSAMINE 1500 COMPLEX PO) Take 1 capsule by mouth 2 (two) times daily.    . hydrochlorothiazide (HYDRODIURIL) 25 MG tablet Take 1 tablet (25 mg total) by mouth daily. 30 tablet 9  . lidocaine-prilocaine (EMLA) cream Apply to affected area once 30 g 3  . lisinopril (PRINIVIL,ZESTRIL) 30 MG tablet Take 1 tablet (30 mg total) by mouth daily. 30 tablet 11  . loratadine (CLARITIN) 10 MG tablet Take 10 mg by mouth daily as needed for allergies (hives).    . Multiple Vitamin (MULTIVITAMIN) capsule Take 1 capsule by mouth daily.     . Omega-3 Fatty Acids (OMEGA-3 FISH OIL) 300 MG CAPS Take 1 capsule by mouth daily.     . ondansetron (ZOFRAN) 8 MG tablet Take 1 tablet (8 mg total) by mouth 2 (two) times daily as needed for refractory nausea / vomiting. Start on day 3 after chemo. 30 tablet 1  . Polyethyl Glycol-Propyl Glycol (SYSTANE ULTRA OP) Apply 1 drop to eye 2 (two) times daily at 10 AM and 5 PM.    . prochlorperazine (COMPAZINE) 10 MG tablet Take 1 tablet (10 mg total) by mouth every 6 (six) hours as needed (Nausea or vomiting). 60 tablet 1  . vitamin E 400 UNIT capsule Take 400 Units by mouth every evening.      No current facility-administered medications for this visit.     PHYSICAL EXAMINATION: ECOG PERFORMANCE STATUS: 1 - Symptomatic but completely ambulatory  Vitals:   08/06/18 1302  BP: 129/61  Pulse: (!) 120  Resp: 18   Temp: 98.2 F (36.8 C)  SpO2: 100%   Filed Weights   08/06/18 1302  Weight: 141 lb (64 kg)    GENERAL:alert, no distress and comfortable SKIN: skin color, texture, turgor are normal, no rashes or significant lesions EYES: normal, Conjunctiva are pink and non-injected, sclera clear OROPHARYNX:no exudate, no erythema and lips, buccal mucosa, and tongue normal  NECK: supple, thyroid normal size, non-tender, without nodularity LYMPH:  no palpable lymphadenopathy in the cervical, axillary or inguinal LUNGS: clear to auscultation and percussion with normal breathing effort HEART: regular rate & rhythm and no murmurs and no lower extremity edema ABDOMEN:abdomen soft, non-tender and normal bowel sounds Musculoskeletal:no cyanosis of digits and no clubbing  NEURO: alert & oriented x 3 with fluent speech, no focal motor/sensory deficits  LABORATORY DATA:  I have reviewed the  data as listed    Component Value Date/Time   NA 135 08/06/2018 1112   NA 142 11/28/2016 0916   K 4.6 08/06/2018 1112   K 3.9 11/28/2016 0916   CL 101 08/06/2018 1112   CO2 25 08/06/2018 1112   CO2 28 11/28/2016 0916   GLUCOSE 89 08/06/2018 1112   GLUCOSE 78 11/28/2016 0916   BUN 23 08/06/2018 1112   BUN 16.8 11/28/2016 0916   CREATININE 0.93 08/06/2018 1112   CREATININE 0.77 01/30/2018 0814   CREATININE 0.8 11/28/2016 0916   CALCIUM 10.2 08/06/2018 1112   CALCIUM 10.5 (H) 11/28/2016 0916   PROT 7.5 08/06/2018 1112   PROT 7.3 11/28/2016 0916   ALBUMIN 4.3 08/06/2018 1112   ALBUMIN 4.3 11/28/2016 0916   AST 21 08/06/2018 1112   AST 17 01/30/2018 0814   AST 19 11/28/2016 0916   ALT 13 08/06/2018 1112   ALT 18 01/30/2018 0814   ALT 19 11/28/2016 0916   ALKPHOS 45 08/06/2018 1112   ALKPHOS 50 11/28/2016 0916   BILITOT 0.5 08/06/2018 1112   BILITOT 0.5 01/30/2018 0814   BILITOT 0.48 11/28/2016 0916   GFRNONAA 58 (L) 08/06/2018 1112   GFRNONAA >60 01/30/2018 0814   GFRAA >60 08/06/2018 1112   GFRAA  >60 01/30/2018 0814    No results found for: SPEP, UPEP  Lab Results  Component Value Date   WBC 3.4 (L) 08/06/2018   NEUTROABS 1.9 08/06/2018   HGB 12.5 08/06/2018   HCT 37.0 08/06/2018   MCV 93.8 08/06/2018   PLT 119 (L) 08/06/2018      Chemistry      Component Value Date/Time   NA 135 08/06/2018 1112   NA 142 11/28/2016 0916   K 4.6 08/06/2018 1112   K 3.9 11/28/2016 0916   CL 101 08/06/2018 1112   CO2 25 08/06/2018 1112   CO2 28 11/28/2016 0916   BUN 23 08/06/2018 1112   BUN 16.8 11/28/2016 0916   CREATININE 0.93 08/06/2018 1112   CREATININE 0.77 01/30/2018 0814   CREATININE 0.8 11/28/2016 0916      Component Value Date/Time   CALCIUM 10.2 08/06/2018 1112   CALCIUM 10.5 (H) 11/28/2016 0916   ALKPHOS 45 08/06/2018 1112   ALKPHOS 50 11/28/2016 0916   AST 21 08/06/2018 1112   AST 17 01/30/2018 0814   AST 19 11/28/2016 0916   ALT 13 08/06/2018 1112   ALT 18 01/30/2018 0814   ALT 19 11/28/2016 0916   BILITOT 0.5 08/06/2018 1112   BILITOT 0.5 01/30/2018 0814   BILITOT 0.48 11/28/2016 0916       All questions were answered. The patient knows to call the clinic with any problems, questions or concerns. No barriers to learning was detected.  I spent 15 minutes counseling the patient face to face. The total time spent in the appointment was 20 minutes and more than 50% was on counseling and review of test results  Heath Lark, MD 08/07/2018 10:07 AM

## 2018-08-07 NOTE — Assessment & Plan Note (Signed)
Pancytopenia is improving She is not symptomatic Observe only.  I recommend influenza vaccination

## 2018-08-07 NOTE — Assessment & Plan Note (Signed)
Her blood pressure is improved with additional blood pressure medication She will continue close monitoring at home.

## 2018-08-10 ENCOUNTER — Encounter: Payer: Self-pay | Admitting: Hematology and Oncology

## 2018-08-27 ENCOUNTER — Inpatient Hospital Stay: Payer: Medicare Other | Admitting: Hematology and Oncology

## 2018-08-27 ENCOUNTER — Inpatient Hospital Stay: Payer: Medicare Other

## 2018-08-27 ENCOUNTER — Telehealth: Payer: Self-pay | Admitting: Hematology and Oncology

## 2018-08-27 ENCOUNTER — Inpatient Hospital Stay: Payer: Medicare Other | Attending: Gynecology

## 2018-08-27 VITALS — BP 146/63 | HR 64 | Temp 98.3°F | Resp 18 | Ht 62.75 in | Wt 139.0 lb

## 2018-08-27 VITALS — BP 132/62 | HR 60

## 2018-08-27 DIAGNOSIS — I10 Essential (primary) hypertension: Secondary | ICD-10-CM

## 2018-08-27 DIAGNOSIS — D61818 Other pancytopenia: Secondary | ICD-10-CM

## 2018-08-27 DIAGNOSIS — C561 Malignant neoplasm of right ovary: Secondary | ICD-10-CM

## 2018-08-27 DIAGNOSIS — Z79899 Other long term (current) drug therapy: Secondary | ICD-10-CM | POA: Insufficient documentation

## 2018-08-27 DIAGNOSIS — Z5112 Encounter for antineoplastic immunotherapy: Secondary | ICD-10-CM | POA: Insufficient documentation

## 2018-08-27 DIAGNOSIS — Z7189 Other specified counseling: Secondary | ICD-10-CM

## 2018-08-27 LAB — CBC WITH DIFFERENTIAL/PLATELET
BASOS ABS: 0 10*3/uL (ref 0.0–0.1)
BASOS PCT: 1 %
Eosinophils Absolute: 0.2 10*3/uL (ref 0.0–0.5)
Eosinophils Relative: 4 %
HEMATOCRIT: 36.2 % (ref 34.8–46.6)
HEMOGLOBIN: 12.2 g/dL (ref 11.6–15.9)
Lymphocytes Relative: 29 %
Lymphs Abs: 1.1 10*3/uL (ref 0.9–3.3)
MCH: 31.5 pg (ref 25.1–34.0)
MCHC: 33.8 g/dL (ref 31.5–36.0)
MCV: 93.2 fL (ref 79.5–101.0)
Monocytes Absolute: 0.4 10*3/uL (ref 0.1–0.9)
Monocytes Relative: 12 %
NEUTROS PCT: 54 %
Neutro Abs: 2 10*3/uL (ref 1.5–6.5)
Platelets: 136 10*3/uL — ABNORMAL LOW (ref 145–400)
RBC: 3.88 MIL/uL (ref 3.70–5.45)
RDW: 13 % (ref 11.2–14.5)
WBC: 3.8 10*3/uL — AB (ref 3.9–10.3)

## 2018-08-27 LAB — COMPREHENSIVE METABOLIC PANEL
ALBUMIN: 4.1 g/dL (ref 3.5–5.0)
ALK PHOS: 44 U/L (ref 38–126)
ALT: 13 U/L (ref 0–44)
AST: 21 U/L (ref 15–41)
Anion gap: 8 (ref 5–15)
BILIRUBIN TOTAL: 0.4 mg/dL (ref 0.3–1.2)
BUN: 22 mg/dL (ref 8–23)
CALCIUM: 10.2 mg/dL (ref 8.9–10.3)
CO2: 27 mmol/L (ref 22–32)
Chloride: 106 mmol/L (ref 98–111)
Creatinine, Ser: 0.93 mg/dL (ref 0.44–1.00)
GFR calc Af Amer: >60 mL/min (ref >60)
GFR calc non Af Amer: 58 mL/min — ABNORMAL LOW (ref >60)
GLUCOSE: 86 mg/dL (ref 70–99)
Potassium: 4.2 mmol/L (ref 3.5–5.1)
SODIUM: 141 mmol/L (ref 135–145)
TOTAL PROTEIN: 7.5 g/dL (ref 6.5–8.1)

## 2018-08-27 LAB — TOTAL PROTEIN, URINE DIPSTICK: Protein, ur: <30 mg/dL — AB

## 2018-08-27 MED ORDER — SODIUM CHLORIDE 0.9% FLUSH
10.0000 mL | INTRAVENOUS | Status: DC | PRN
  Administered 2018-08-27: 10 mL
  Filled 2018-08-27: qty 10

## 2018-08-27 MED ORDER — SODIUM CHLORIDE 0.9 % IV SOLN
Freq: Once | INTRAVENOUS | Status: AC
  Administered 2018-08-27: 13:00:00 via INTRAVENOUS
  Filled 2018-08-27: qty 250

## 2018-08-27 MED ORDER — SODIUM CHLORIDE 0.9% FLUSH
10.0000 mL | Freq: Once | INTRAVENOUS | Status: AC
  Administered 2018-08-27: 10 mL
  Filled 2018-08-27: qty 10

## 2018-08-27 MED ORDER — SODIUM CHLORIDE 0.9 % IV SOLN
15.0000 mg/kg | Freq: Once | INTRAVENOUS | Status: AC
  Administered 2018-08-27: 1000 mg via INTRAVENOUS
  Filled 2018-08-27: qty 32

## 2018-08-27 MED ORDER — HEPARIN SOD (PORK) LOCK FLUSH 100 UNIT/ML IV SOLN
500.0000 [IU] | Freq: Once | INTRAVENOUS | Status: AC | PRN
  Administered 2018-08-27: 500 [IU]
  Filled 2018-08-27: qty 5

## 2018-08-27 NOTE — Telephone Encounter (Signed)
Gave patient avs and calendar.  Patient preferred to get water based contrast from Radiology for CT ordered.

## 2018-08-27 NOTE — Patient Instructions (Signed)
[facility] Discharge Instructions for Patients Receiving Chemotherapy  Today you received the following chemotherapy agents Avastin  To help prevent nausea and vomiting after your treatment, we encourage you to take your nausea medication as directed   If you develop nausea and vomiting that is not controlled by your nausea medication, call the clinic.   BELOW ARE SYMPTOMS THAT SHOULD BE REPORTED IMMEDIATELY:  *FEVER GREATER THAN 100.5 F  *CHILLS WITH OR WITHOUT FEVER  NAUSEA AND VOMITING THAT IS NOT CONTROLLED WITH YOUR NAUSEA MEDICATION  *UNUSUAL SHORTNESS OF BREATH  *UNUSUAL BRUISING OR BLEEDING  TENDERNESS IN MOUTH AND THROAT WITH OR WITHOUT PRESENCE OF ULCERS  *URINARY PROBLEMS  *BOWEL PROBLEMS  UNUSUAL RASH Items with * indicate a potential emergency and should be followed up as soon as possible.  Feel free to call the clinic should you have any questions or concerns. The clinic phone number is [phone].  Please show the CHEMO ALERT CARD at check-in to the Emergency Department and triage nurse.   

## 2018-08-28 ENCOUNTER — Encounter: Payer: Self-pay | Admitting: Hematology and Oncology

## 2018-08-28 NOTE — Assessment & Plan Note (Signed)
She has intermittent proteinuria She brought with her a copy of her blood pressure monitoring over the last few weeks Majority of her blood pressure control is excellent, with rare occasional systolic blood pressure exceeding 141 She will continue current prescription blood pressure medications as directed.

## 2018-08-28 NOTE — Assessment & Plan Note (Signed)
Clinically, she is doing well and has not experienced any signs or symptoms of cancer recurrence Her tumor marker is stable I do not plan to repeat imaging study until end of October and that she have new symptoms.  She agreed with the plan of care.

## 2018-08-28 NOTE — Progress Notes (Signed)
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  She agreed with the plan of care.  Pancytopenia, acquired ([place]) Overall improving.  She is not symptomatic.  Observe only.  Essential hypertension She has intermittent proteinuria She brought with her a copy of her blood pressure monitoring over the last few weeks Majority of her blood pressure control is excellent, with rare occasional systolic blood pressure exceeding 141 She will continue current prescription blood pressure medications as directed.   Orders Placed This Encounter  Procedures  . CT ABDOMEN PELVIS W CONTRAST    Standing Status:   Future    Standing Expiration Date:   08/28/2019    Order Specific Question:   If indicated for the ordered procedure, I authorize the administration of contrast media per Radiology protocol    Answer:   Yes    Order Specific Question:   Preferred imaging location?    Answer:   [facility]    Order Specific Question:   Radiology Contrast Protocol - do NOT remove file path    Answer:   \\[email or web address]  . CT CHEST W CONTRAST    Standing Status:   Future    Standing Expiration Date:   08/28/2019    Order Specific Question:   If indicated for the ordered procedure, I authorize the administration of contrast media per Radiology protocol    Answer:   Yes    Order Specific Question:   Preferred imaging location?    Answer:   [facility]    Order Specific Question:   Radiology Contrast Protocol - do NOT remove file path    Answer:   \\[email or web address]    INTERVAL HISTORY: Please  see below for problem oriented charting. She returns for further follow-up She feels well She brought with her a copy of her blood pressure measurement over the past few weeks Her blood pressure control is excellent She is not symptomatic Denies recent infection, fever or chills No recent nausea, abdominal pain or changes in bowel habits   SUMMARY OF ONCOLOGIC HISTORY: Oncology History   Neg genetics. Additional tests revealed ER: 80%, PR 3%      Right ovarian epithelial cancer ([place])   07/01/2015 Initial Diagnosis    She presented with postmenopausal bleeding    07/02/2015 Imaging    US pelvis showed bilateral ovarian cyst    07/13/2015 Imaging    5.2 cm left adnexal cystic lesion, with indeterminate but probably benign characteristics. In a postmenopausal female, consider continued annual imaging followup with CT or MRI versus surgical evaluation.  Colonic diverticulosis. No radiographic evidence of diverticulitis.  Cholelithiasis.  No radiographic evidence of cholecystitis.  Small hiatal hernia.    07/30/2015 Pathology Results    Outside pathology showed right ovary with high-grade serous carcinoma, 2 cm in maximum dimension.  The tumor involves serosal surface of the right ovary and adjacent right fallopian tube.  Cervix and endometrium were within normal limits. ER positive Peritoneal washing was positive.    07/30/2015 Surgery    SHe underwent laparoscopic-assisted total vaginal hysterectomy, bilateral salpingo-oophorectomy    07/30/2015 Pathology Results    PERITONEAL WASHING  PELVIC (SPECIMEN 1 OF 1, COLLECTED ON 07/30/2015): MALIGNANT CELLS CONSISTENT WITH HIGH GRADE CARCINOMA    08/13/2015 Tumor Marker    Patient's tumor was tested for the following markers: CA-125 Results of the tumor marker test revealed 96    08/25/2015 - 12/08/2015 Chemotherapy    The patient had 6 cycles of carboplatin and taxol    09/07/2015 Tumor Marker    Patient's tumor was tested for the  following markers: CA-125 Results of the tumor marker test revealed 51    10/05/2015 Tumor Marker    Patient's tumor was tested for the following markers: CA-125 Results of the tumor marker test revealed 29    11/09/2015 Tumor Marker    Patient's tumor was tested for the following markers: CA-125 Results of the tumor marker test revealed 44    12/08/2015 Tumor Marker    Patient's tumor was tested for the following markers: CA-125 Results of the tumor marker test revealed 30    12/15/2015 Genetic Testing    Genetics testing normal by [facility] Breast Ovarian panel 12-15-15    01/11/2016 Imaging    1. The only finding of note are several adjacent peripheral abnormal hypodensities along the anterior superior spleen margin, differential diagnostic considerations including small peripheral interval splenic infarcts, splenic injury with subcapsular a fluid collections, or less likely metastatic disease to the splenic margin. This likely merits observation. 2. Other imaging findings of potential clinical significance: Small type 1 hiatal hernia. Aortoiliac atherosclerotic vascular disease. Lower lumbar spondylosis and degenerative disc disease. Gallstones with potential mild gallbladder wall thickening.    03/07/2016 Tumor Marker    Patient's tumor was tested for the following markers: CA-125 Results of the tumor marker test revealed 24.2    04/06/2016 Imaging    1. No evidence of a ovarian cancer metastasis. 2. No ascites. 3. Post hysterectomy and oophorectomy. 4. Cholelithiasis with several large gallstones    04/06/2016 Tumor Marker    Patient's tumor was tested for the following markers: CA-125 Results of the tumor marker test revealed 22.8    05/30/2016 Tumor Marker    Patient's tumor was tested for the following markers: CA-125 Results of the tumor marker test revealed 21    09/07/2016 Tumor Marker    Patient's tumor was tested for the following markers: CA-125 Results of the tumor  marker test revealed 22.4    11/28/2016 Tumor Marker    Patient's tumor was tested for the following markers: CA-125 Results of the tumor marker test revealed 18.8    02/23/2017 Tumor Marker    Patient's tumor was tested for the following markers: CA-125 Results of the tumor marker test revealed 19.1    06/02/2017 Tumor Marker    Patient's tumor was tested for the following markers: CA-125 Results of the tumor marker test revealed 18.3    08/24/2017 Mammogram    Pt reports mammogram complete    08/28/2017 Tumor Marker    Patient's tumor was tested for the following markers: CA-125 Results of the tumor marker test revealed 21.1    12/01/2017 Tumor Marker    Patient's tumor was tested for the following markers: CA-125 Results of the tumor marker test revealed 31.2    12/13/2017 Imaging    New 2.7 cm peritoneal soft tissue mass in anterior left lower quadrant, consistent with metastatic disease.  No other sites of metastatic disease identified.  Incidental findings including: Cholelithiasis. Colonic diverticulosis. Tiny hiatal hernia. Aortic atherosclerosis.    01/09/2018 - 05/07/2018 Chemotherapy  The patient had carboplatin and taxol; After 2nd cycle, she developed carboplatin allergy. She received carboplatin desensitization protocol at [facility] for final 4 cycles, completed by 6/17. Avastin was added from 04/16/18 onwards    01/30/2018 Adverse Reaction    Potential carboplatin allergy is suspected. She received half the dose of prescribed carboplatin on cycle 2    05/31/2018 Tumor Marker    Patient's tumor was tested for the following markers: CA-125 Results of the tumor marker test revealed 26    05/31/2018 Imaging    1. Peritoneal soft tissue nodule identified on the previous study has decreased in the interval the. No progressive findings in the abdomen or pelvis on today's study to suggest disease progression. 2. Tiny pulmonary nodules, likely benign. Attention on follow-up  recommended. 3. Cholelithiasis. 4.  Aortic Atherosclerois (ICD10-170.0) 5. Tiny hiatal hernia.      06/04/2018 -  Chemotherapy    The patient is placed on Avastin for maintenance    06/25/2018 Tumor Marker    Patient's tumor was tested for the following markers: CA-125 Results of the tumor marker test revealed 22.5    08/06/2018 Tumor Marker    Patient's tumor was tested for the following markers: CA-125 Results of the tumor marker test revealed 20.7     REVIEW OF SYSTEMS:   Constitutional: Denies fevers, chills or abnormal weight loss Eyes: Denies blurriness of vision Ears, nose, mouth, throat, and face: Denies mucositis or sore throat Respiratory: Denies cough, dyspnea or wheezes Cardiovascular: Denies palpitation, chest discomfort or lower extremity swelling Gastrointestinal:  Denies nausea, heartburn or change in bowel habits Skin: Denies abnormal skin rashes Lymphatics: Denies new lymphadenopathy or easy bruising Neurological:Denies numbness, tingling or new weaknesses Behavioral/Psych: Mood is stable, no new changes  All other systems were reviewed with the patient and are negative.  I have reviewed the past medical history, past surgical history, social history and family history with the patient and they are unchanged from previous note.  ALLERGIES:  is allergic to carboplatin.  MEDICATIONS:  Current Outpatient Medications  Medication Sig Dispense Refill  . amLODipine (NORVASC) 5 MG tablet Take 1 tablet (5 mg total) by mouth daily. 30 tablet 0  . atorvastatin (LIPITOR) 10 MG tablet Take 1 tablet (10 mg total) by mouth daily. 30 tablet 2  . Coenzyme Q10 (CO Q 10) 100 MG CAPS Take 1 capsule by mouth daily.     . Glucosamine-Chondroit-Vit C-Mn (GLUCOSAMINE 1500 COMPLEX PO) Take 1 capsule by mouth 2 (two) times daily.    . hydrochlorothiazide (HYDRODIURIL) 25 MG tablet Take 1 tablet (25 mg total) by mouth daily. 30 tablet 9  . lidocaine-prilocaine (EMLA) cream Apply to  affected area once 30 g 3  . lisinopril (PRINIVIL,ZESTRIL) 30 MG tablet Take 1 tablet (30 mg total) by mouth daily. 30 tablet 11  . loratadine (CLARITIN) 10 MG tablet Take 10 mg by mouth daily as needed for allergies (hives).    . Multiple Vitamin (MULTIVITAMIN) capsule Take 1 capsule by mouth daily.     . Omega-3 Fatty Acids (OMEGA-3 FISH OIL) 300 MG CAPS Take 1 capsule by mouth daily.     . ondansetron (ZOFRAN) 8 MG tablet Take 1 tablet (8 mg total) by mouth 2 (two) times daily as needed for refractory nausea / vomiting. Start on day 3 after chemo. 30 tablet 1  . Polyethyl Glycol-Propyl Glycol (SYSTANE ULTRA OP) Apply 1 drop to eye 2 (two) times daily at 10 AM and 5 PM.    .  prochlorperazine (COMPAZINE) 10 MG tablet Take 1 tablet (10 mg total) by mouth every 6 (six) hours as needed (Nausea or vomiting). 60 tablet 1  . vitamin E 400 UNIT capsule Take 400 Units by mouth every evening.      No current facility-administered medications for this visit.     PHYSICAL EXAMINATION: ECOG PERFORMANCE STATUS: 0 - Asymptomatic  Vitals:   08/27/18 1240  BP: (!) 146/63  Pulse: 64  Resp: 18  Temp: 98.3 F (36.8 C)  SpO2: 100%   Filed Weights   08/27/18 1240  Weight: 139 lb (63 kg)    GENERAL:alert, no distress and comfortable SKIN: skin color, texture, turgor are normal, no rashes or significant lesions EYES: normal, Conjunctiva are pink and non-injected, sclera clear OROPHARYNX:no exudate, no erythema and lips, buccal mucosa, and tongue normal  NECK: supple, thyroid normal size, non-tender, without nodularity LYMPH:  no palpable lymphadenopathy in the cervical, axillary or inguinal LUNGS: clear to auscultation and percussion with normal breathing effort HEART: regular rate & rhythm and no murmurs and no lower extremity edema ABDOMEN:abdomen soft, non-tender and normal bowel sounds Musculoskeletal:no cyanosis of digits and no clubbing  NEURO: alert & oriented x 3 with fluent speech, no  focal motor/sensory deficits  LABORATORY DATA:  I have reviewed the data as listed    Component Value Date/Time   NA 141 08/27/2018 1138   NA 142 11/28/2016 0916   K 4.2 08/27/2018 1138   K 3.9 11/28/2016 0916   CL 106 08/27/2018 1138   CO2 27 08/27/2018 1138   CO2 28 11/28/2016 0916   GLUCOSE 86 08/27/2018 1138   GLUCOSE 78 11/28/2016 0916   BUN 22 08/27/2018 1138   BUN 16.8 11/28/2016 0916   CREATININE 0.93 08/27/2018 1138   CREATININE 0.77 01/30/2018 0814   CREATININE 0.8 11/28/2016 0916   CALCIUM 10.2 08/27/2018 1138   CALCIUM 10.5 (H) 11/28/2016 0916   PROT 7.5 08/27/2018 1138   PROT 7.3 11/28/2016 0916   ALBUMIN 4.1 08/27/2018 1138   ALBUMIN 4.3 11/28/2016 0916   AST 21 08/27/2018 1138   AST 17 01/30/2018 0814   AST 19 11/28/2016 0916   ALT 13 08/27/2018 1138   ALT 18 01/30/2018 0814   ALT 19 11/28/2016 0916   ALKPHOS 44 08/27/2018 1138   ALKPHOS 50 11/28/2016 0916   BILITOT 0.4 08/27/2018 1138   BILITOT 0.5 01/30/2018 0814   BILITOT 0.48 11/28/2016 0916   GFRNONAA 58 (L) 08/27/2018 1138   GFRNONAA >60 01/30/2018 0814   GFRAA >60 08/27/2018 1138   GFRAA >60 01/30/2018 0814    No results found for: SPEP, UPEP  Lab Results  Component Value Date   WBC 3.8 (L) 08/27/2018   NEUTROABS 2.0 08/27/2018   HGB 12.2 08/27/2018   HCT 36.2 08/27/2018   MCV 93.2 08/27/2018   PLT 136 (L) 08/27/2018      Chemistry      Component Value Date/Time   NA 141 08/27/2018 1138   NA 142 11/28/2016 0916   K 4.2 08/27/2018 1138   K 3.9 11/28/2016 0916   CL 106 08/27/2018 1138   CO2 27 08/27/2018 1138   CO2 28 11/28/2016 0916   BUN 22 08/27/2018 1138   BUN 16.8 11/28/2016 0916   CREATININE 0.93 08/27/2018 1138   CREATININE 0.77 01/30/2018 0814   CREATININE 0.8 11/28/2016 0916      Component Value Date/Time   CALCIUM 10.2 08/27/2018 1138   CALCIUM 10.5 (H) 11/28/2016 3419  ALKPHOS 44 08/27/2018 1138   ALKPHOS 50 11/28/2016 0916   AST 21 08/27/2018 1138   AST 17  01/30/2018 0814   AST 19 11/28/2016 0916   ALT 13 08/27/2018 1138   ALT 18 01/30/2018 0814   ALT 19 11/28/2016 0916   BILITOT 0.4 08/27/2018 1138   BILITOT 0.5 01/30/2018 0814   BILITOT 0.48 11/28/2016 0916      All questions were answered. The patient knows to call the clinic with any problems, questions or concerns. No barriers to learning was detected.  I spent 15 minutes counseling the patient face to face. The total time spent in the appointment was 20 minutes and more than 50% was on counseling and review of test results  Heath Lark, MD 08/28/2018 8:39 AM

## 2018-08-28 NOTE — Assessment & Plan Note (Signed)
Overall improving.  She is not symptomatic.  Observe only.

## 2018-09-14 ENCOUNTER — Inpatient Hospital Stay: Payer: Medicare Other

## 2018-09-14 ENCOUNTER — Ambulatory Visit (HOSPITAL_COMMUNITY)
Admission: RE | Admit: 2018-09-14 | Discharge: 2018-09-14 | Disposition: A | Discharge status: Home / Self Care | Payer: Medicare Other | Source: Ambulatory Visit | Attending: Hematology and Oncology | Admitting: Hematology and Oncology

## 2018-09-14 ENCOUNTER — Other Ambulatory Visit: Payer: Medicare Other

## 2018-09-14 ENCOUNTER — Encounter (HOSPITAL_COMMUNITY): Payer: Self-pay

## 2018-09-14 DIAGNOSIS — Z5112 Encounter for antineoplastic immunotherapy: Secondary | ICD-10-CM | POA: Diagnosis not present

## 2018-09-14 DIAGNOSIS — C561 Malignant neoplasm of right ovary: Secondary | ICD-10-CM

## 2018-09-14 DIAGNOSIS — R918 Other nonspecific abnormal finding of lung field: Secondary | ICD-10-CM | POA: Insufficient documentation

## 2018-09-14 DIAGNOSIS — Z90710 Acquired absence of both cervix and uterus: Secondary | ICD-10-CM | POA: Diagnosis not present

## 2018-09-14 LAB — COMPREHENSIVE METABOLIC PANEL
ALBUMIN: 4 g/dL (ref 3.5–5.0)
ALK PHOS: 46 U/L (ref 38–126)
ALT: 16 U/L (ref 0–44)
AST: 23 U/L (ref 15–41)
Anion gap: 9 (ref 5–15)
BILIRUBIN TOTAL: 0.5 mg/dL (ref 0.3–1.2)
BUN: 23 mg/dL (ref 8–23)
CALCIUM: 9.6 mg/dL (ref 8.9–10.3)
CO2: 25 mmol/L (ref 22–32)
CREATININE: 0.82 mg/dL (ref 0.44–1.00)
Chloride: 100 mmol/L (ref 98–111)
GFR calc Af Amer: >60 mL/min (ref >60)
GFR calc non Af Amer: >60 mL/min (ref >60)
GLUCOSE: 93 mg/dL (ref 70–99)
Potassium: 4.2 mmol/L (ref 3.5–5.1)
Sodium: 134 mmol/L — ABNORMAL LOW (ref 135–145)
Total Protein: 7.1 g/dL (ref 6.5–8.1)

## 2018-09-14 LAB — TOTAL PROTEIN, URINE DIPSTICK: Protein, ur: NEGATIVE mg/dL

## 2018-09-14 LAB — CBC WITH DIFFERENTIAL/PLATELET
ABS IMMATURE GRANULOCYTES: 0.01 10*3/uL (ref 0.00–0.07)
BASOS ABS: 0 10*3/uL (ref 0.0–0.1)
Basophils Relative: 1 %
EOS PCT: 3 %
Eosinophils Absolute: 0.1 10*3/uL (ref 0.0–0.5)
HEMATOCRIT: 36.4 % (ref 36.0–46.0)
HEMOGLOBIN: 12.1 g/dL (ref 12.0–15.0)
Immature Granulocytes: 0 %
LYMPHS ABS: 1.4 10*3/uL (ref 0.7–4.0)
LYMPHS PCT: 27 %
MCH: 31.4 pg (ref 26.0–34.0)
MCHC: 33.2 g/dL (ref 30.0–36.0)
MCV: 94.5 fL (ref 80.0–100.0)
Monocytes Absolute: 0.5 10*3/uL (ref 0.1–1.0)
Monocytes Relative: 10 %
NEUTROS ABS: 3 10*3/uL (ref 1.7–7.7)
NRBC: 0 % (ref 0.0–0.2)
Neutrophils Relative %: 59 %
Platelets: 132 10*3/uL — ABNORMAL LOW (ref 150–400)
RBC: 3.85 MIL/uL — ABNORMAL LOW (ref 3.87–5.11)
RDW: 12.7 % (ref 11.5–15.5)
WBC: 5.1 10*3/uL (ref 4.0–10.5)

## 2018-09-14 MED ORDER — HEPARIN SOD (PORK) LOCK FLUSH 100 UNIT/ML IV SOLN
INTRAVENOUS | Status: AC
  Administered 2018-09-14: 500 [IU] via INTRAVENOUS
  Filled 2018-09-14: qty 5

## 2018-09-14 MED ORDER — SODIUM CHLORIDE 0.9 % IJ SOLN
INTRAMUSCULAR | Status: AC
  Filled 2018-09-14: qty 50

## 2018-09-14 MED ORDER — IOHEXOL 300 MG/ML  SOLN
30.0000 mL | Freq: Once | INTRAMUSCULAR | Status: AC | PRN
  Administered 2018-09-14: 30 mL via ORAL

## 2018-09-14 MED ORDER — HEPARIN SOD (PORK) LOCK FLUSH 100 UNIT/ML IV SOLN
500.0000 [IU] | Freq: Once | INTRAVENOUS | Status: AC
  Administered 2018-09-14: 500 [IU] via INTRAVENOUS

## 2018-09-14 MED ORDER — IOHEXOL 300 MG/ML  SOLN
100.0000 mL | Freq: Once | INTRAMUSCULAR | Status: AC | PRN
  Administered 2018-09-14: 100 mL via INTRAVENOUS

## 2018-09-15 LAB — CA 125: CANCER ANTIGEN (CA) 125: 21.3 U/mL (ref 0.0–38.1)

## 2018-09-17 ENCOUNTER — Other Ambulatory Visit: Payer: Medicare Other

## 2018-09-17 ENCOUNTER — Ambulatory Visit: Payer: Medicare Other

## 2018-09-17 ENCOUNTER — Ambulatory Visit: Payer: Medicare Other | Admitting: Hematology and Oncology

## 2018-09-18 ENCOUNTER — Inpatient Hospital Stay: Payer: Medicare Other

## 2018-09-18 ENCOUNTER — Encounter: Payer: Self-pay | Admitting: Hematology and Oncology

## 2018-09-18 ENCOUNTER — Other Ambulatory Visit: Payer: Self-pay | Admitting: Hematology and Oncology

## 2018-09-18 ENCOUNTER — Telehealth: Payer: Self-pay | Admitting: Hematology and Oncology

## 2018-09-18 ENCOUNTER — Inpatient Hospital Stay: Payer: Medicare Other | Admitting: Hematology and Oncology

## 2018-09-18 VITALS — BP 137/64 | HR 59 | Temp 98.0°F | Resp 18

## 2018-09-18 DIAGNOSIS — Z7189 Other specified counseling: Secondary | ICD-10-CM

## 2018-09-18 DIAGNOSIS — C561 Malignant neoplasm of right ovary: Secondary | ICD-10-CM | POA: Diagnosis not present

## 2018-09-18 DIAGNOSIS — D61818 Other pancytopenia: Secondary | ICD-10-CM | POA: Diagnosis not present

## 2018-09-18 DIAGNOSIS — I10 Essential (primary) hypertension: Secondary | ICD-10-CM

## 2018-09-18 DIAGNOSIS — Z5112 Encounter for antineoplastic immunotherapy: Secondary | ICD-10-CM | POA: Diagnosis not present

## 2018-09-18 DIAGNOSIS — R809 Proteinuria, unspecified: Secondary | ICD-10-CM | POA: Diagnosis not present

## 2018-09-18 MED ORDER — SODIUM CHLORIDE 0.9% FLUSH
10.0000 mL | INTRAVENOUS | Status: DC | PRN
  Administered 2018-09-18: 10 mL
  Filled 2018-09-18: qty 10

## 2018-09-18 MED ORDER — SODIUM CHLORIDE 0.9 % IV SOLN
15.0000 mg/kg | Freq: Once | INTRAVENOUS | Status: AC
  Administered 2018-09-18: 1000 mg via INTRAVENOUS
  Filled 2018-09-18: qty 32

## 2018-09-18 MED ORDER — SODIUM CHLORIDE 0.9 % IV SOLN
Freq: Once | INTRAVENOUS | Status: AC
  Administered 2018-09-18: 13:00:00 via INTRAVENOUS
  Filled 2018-09-18: qty 250

## 2018-09-18 MED ORDER — HEPARIN SOD (PORK) LOCK FLUSH 100 UNIT/ML IV SOLN
500.0000 [IU] | Freq: Once | INTRAVENOUS | Status: AC | PRN
  Administered 2018-09-18: 500 [IU]
  Filled 2018-09-18: qty 5

## 2018-09-18 NOTE — Progress Notes (Signed)
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  Observe only  Essential hypertension She has intermittent proteinuria With aggressive blood pressure medication and monitoring, blood pressure control is excellent, with rare occasional systolic blood pressure exceeding upper limits of normal She will continue current prescription blood pressure medications as directed.   No orders of the defined types were placed in this encounter.   INTERVAL HISTORY: Please see below for problem oriented charting. She returns for further follow-up. She feels well Denies recent infection, fever or chills Her blood pressure control at home is excellent Denies abdominal bloating, changes in bowel habits or nausea. No recent bleeding  SUMMARY OF ONCOLOGIC HISTORY: Oncology History   Neg genetics. 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 In a postmenopausal female, consider continued annual imaging followup with CT or MRI versus surgical evaluation.  Colonic diverticulosis. No radiographic evidence of diverticulitis.  Cholelithiasis.  No radiographic evidence of  cholecystitis.  Small hiatal hernia.    07/30/2015 Pathology Results    Outside pathology showed right ovary with high-grade serous carcinoma, 2 cm in maximum dimension.  The tumor involves serosal surface of the right ovary and adjacent right fallopian tube.  Cervix and endometrium were within normal limits. ER positive Peritoneal washing was positive.    07/30/2015 Surgery    SHe underwent laparoscopic-assisted total vaginal hysterectomy, bilateral salpingo-oophorectomy    07/30/2015 Pathology Results    PERITONEAL WASHING PELVIC (SPECIMEN 1 OF 1, COLLECTED ON 07/30/2015): MALIGNANT CELLS CONSISTENT WITH HIGH GRADE CARCINOMA    08/13/2015 Tumor Marker    Patient's tumor was tested for the following markers: CA-125 Results of the tumor marker test revealed 96    08/25/2015 - 12/08/2015 Chemotherapy    The patient had 6 cycles of carboplatin and taxol    09/07/2015 Tumor Marker    Patient's tumor was tested for the following markers: CA-125 Results of the tumor marker test revealed 51    10/05/2015 Tumor Marker    Patient's tumor was tested for the following markers: CA-125 Results of the tumor marker test revealed 29    11/09/2015 Tumor Marker    Patient's tumor was tested for the following markers: CA-125 Results of the tumor marker test revealed 44    12/08/2015 Tumor Marker    Patient's tumor was tested for the following markers: CA-125 Results of the tumor marker test revealed 30    12/15/2015 Genetic Testing    Genetics testing normal by [facility] Breast Ovarian panel 12-15-15    01/11/2016 Imaging    1. The only finding of note are several adjacent peripheral abnormal hypodensities along the anterior superior spleen margin, differential diagnostic considerations including small peripheral interval splenic  infarcts, splenic injury with subcapsular a fluid collections, or less likely metastatic disease to the splenic margin. This likely merits observation. 2. Other imaging findings of  potential clinical significance: Small type 1 hiatal hernia. Aortoiliac atherosclerotic vascular disease. Lower lumbar spondylosis and degenerative disc disease. Gallstones with potential mild gallbladder wall thickening.    03/07/2016 Tumor Marker    Patient's tumor was tested for the following markers: CA-125 Results of the tumor marker test revealed 24.2    04/06/2016 Imaging    1. No evidence of a ovarian cancer metastasis. 2. No ascites. 3. Post hysterectomy and oophorectomy. 4. Cholelithiasis with several large gallstones    04/06/2016 Tumor Marker    Patient's tumor was tested for the following markers: CA-125 Results of the tumor marker test revealed 22.8    05/30/2016 Tumor Marker    Patient's tumor was tested for the following markers: CA-125 Results of the tumor marker test revealed 21    09/07/2016 Tumor Marker    Patient's tumor was tested for the following markers: CA-125 Results of the tumor marker test revealed 22.4    11/28/2016 Tumor Marker    Patient's tumor was tested for the following markers: CA-125 Results of the tumor marker test revealed 18.8    02/23/2017 Tumor Marker    Patient's tumor was tested for the following markers: CA-125 Results of the tumor marker test revealed 19.1    06/02/2017 Tumor Marker    Patient's tumor was tested for the following markers: CA-125 Results of the tumor marker test revealed 18.3    08/24/2017 Mammogram    Pt reports mammogram complete    08/28/2017 Tumor Marker    Patient's tumor was tested for the following markers: CA-125 Results of the tumor marker test revealed 21.1    12/01/2017 Tumor Marker    Patient's tumor was tested for the following markers: CA-125 Results of the tumor marker test revealed 31.2    12/13/2017 Imaging    New 2.7 cm peritoneal soft tissue mass in anterior left lower quadrant, consistent with metastatic disease.  No other sites of metastatic disease identified.  Incidental findings including:  Cholelithiasis. Colonic diverticulosis. Tiny hiatal hernia. Aortic atherosclerosis.    01/09/2018 - 05/07/2018 Chemotherapy    The patient had carboplatin and taxol; After 2nd cycle, she developed carboplatin allergy. She received carboplatin desensitization protocol at [facility] for final 4 cycles, completed by 6/17. Avastin was added from 04/16/18 onwards    01/30/2018 Adverse Reaction    Potential carboplatin allergy is suspected. She received half the dose of prescribed carboplatin on cycle 2    05/31/2018 Tumor Marker    Patient's tumor was tested for the following markers: CA-125 Results of the tumor marker test revealed 26    05/31/2018 Imaging    1. Peritoneal soft tissue nodule identified on the previous study has decreased in the interval the. No progressive findings in the abdomen or pelvis on today's study to suggest disease progression. 2. Tiny pulmonary nodules, likely benign. Attention on follow-up recommended. 3. Cholelithiasis. 4.  Aortic Atherosclerois (ICD10-170.0) 5. Tiny hiatal hernia.      06/04/2018 -  Chemotherapy    The patient is placed on Avastin for maintenance    06/25/2018 Tumor Marker    Patient's tumor was tested for the following markers: CA-125 Results of the tumor marker test revealed 22.5    08/06/2018 Tumor Marker    Patient's tumor was tested for the following markers: CA-125 Results of the tumor marker test revealed 20.7  09/14/2018 Imaging    Status post hysterectomy and bilateral salpingo-oophorectomy.  Stable soft tissue nodule beneath the left lower anterior abdominal wall, likely reflecting stable peritoneal disease.  Scattered small subpleural nodules in the lungs bilaterally, measuring up to 3 mm, technically indeterminate although likely benign. Please note that Fleischner Society guidelines do not apply. Attention on follow-up is suggested.  No evidence of new/progressive metastatic disease.    09/14/2018 Tumor Marker    Patient's tumor  was tested for the following markers: CA-125 Results of the tumor marker test revealed 21.3     REVIEW OF SYSTEMS:   Constitutional: Denies fevers, chills or abnormal weight loss Eyes: Denies blurriness of vision Ears, nose, mouth, throat, and face: Denies mucositis or sore throat Respiratory: Denies cough, dyspnea or wheezes Cardiovascular: Denies palpitation, chest discomfort or lower extremity swelling Gastrointestinal:  Denies nausea, heartburn or change in bowel habits Skin: Denies abnormal skin rashes Lymphatics: Denies new lymphadenopathy or easy bruising Neurological:Denies numbness, tingling or new weaknesses Behavioral/Psych: Mood is stable, no new changes  All other systems were reviewed with the patient and are negative.  I have reviewed the past medical history, past surgical history, social history and family history with the patient and they are unchanged from previous note.  ALLERGIES:  is allergic to carboplatin.  MEDICATIONS:  Current Outpatient Medications  Medication Sig Dispense Refill  . Probiotic Product (PROBIOTIC ADVANCED PO) Take 1 capsule by mouth daily.    [name] amLODipine (NORVASC) 5 MG tablet Take 1 tablet (5 mg total) by mouth daily. 30 tablet 0  . atorvastatin (LIPITOR) 10 MG tablet Take 1 tablet (10 mg total) by mouth daily. 30 tablet 2  . Coenzyme Q10 (CO Q 10) 100 MG CAPS Take 1 capsule by mouth daily.     . Glucosamine-Chondroit-Vit C-Mn (GLUCOSAMINE 1500 COMPLEX PO) Take 1 capsule by mouth 2 (two) times daily.    . hydrochlorothiazide (HYDRODIURIL) 25 MG tablet Take 1 tablet (25 mg total) by mouth daily. 30 tablet 9  . lidocaine-prilocaine (EMLA) cream Apply to affected area once 30 g 3  . lisinopril (PRINIVIL,ZESTRIL) 30 MG tablet Take 1 tablet (30 mg total) by mouth daily. 30 tablet 11  . loratadine (CLARITIN) 10 MG tablet Take 10 mg by mouth daily as needed for allergies (hives).    . Multiple Vitamin (MULTIVITAMIN) capsule Take 1 capsule by mouth  daily.     . Omega-3 Fatty Acids (OMEGA-3 FISH OIL) 300 MG CAPS Take 1 capsule by mouth daily.     . ondansetron (ZOFRAN) 8 MG tablet Take 1 tablet (8 mg total) by mouth 2 (two) times daily as needed for refractory nausea / vomiting. Start on day 3 after chemo. 30 tablet 1  . Polyethyl Glycol-Propyl Glycol (SYSTANE ULTRA OP) Apply 1 drop to eye 2 (two) times daily at 10 AM and 5 PM.    . prochlorperazine (COMPAZINE) 10 MG tablet Take 1 tablet (10 mg total) by mouth every 6 (six) hours as needed (Nausea or vomiting). 60 tablet 1  . vitamin E 400 UNIT capsule Take 400 Units by mouth every evening.      No current facility-administered medications for this visit.     PHYSICAL EXAMINATION: ECOG PERFORMANCE STATUS: 0 - Asymptomatic  Vitals:   09/18/18 1231  BP: (!) 132/53  Pulse: 73  Resp: 18  Temp: 97.6 F (36.4 C)  SpO2: 100%   Filed Weights   09/18/18 1231  Weight: 139 lb 6.4 oz (63.2 kg)  GENERAL:alert, no distress and comfortable SKIN: skin color, texture, turgor are normal, no rashes or significant lesions Musculoskeletal:no cyanosis of digits and no clubbing  NEURO: alert & oriented x 3 with fluent speech, no focal motor/sensory deficits  LABORATORY DATA:  I have reviewed the data as listed    Component Value Date/Time   NA 134 (L) 09/14/2018 1106   NA 142 11/28/2016 0916   K 4.2 09/14/2018 1106   K 3.9 11/28/2016 0916   CL 100 09/14/2018 1106   CO2 25 09/14/2018 1106   CO2 28 11/28/2016 0916   GLUCOSE 93 09/14/2018 1106   GLUCOSE 78 11/28/2016 0916   BUN 23 09/14/2018 1106   BUN 16.8 11/28/2016 0916   CREATININE 0.82 09/14/2018 1106   CREATININE 0.77 01/30/2018 0814   CREATININE 0.8 11/28/2016 0916   CALCIUM 9.6 09/14/2018 1106   CALCIUM 10.5 (H) 11/28/2016 0916   PROT 7.1 09/14/2018 1106   PROT 7.3 11/28/2016 0916   ALBUMIN 4.0 09/14/2018 1106   ALBUMIN 4.3 11/28/2016 0916   AST 23 09/14/2018 1106   AST 17 01/30/2018 0814   AST 19 11/28/2016 0916   ALT  16 09/14/2018 1106   ALT 18 01/30/2018 0814   ALT 19 11/28/2016 0916   ALKPHOS 46 09/14/2018 1106   ALKPHOS 50 11/28/2016 0916   BILITOT 0.5 09/14/2018 1106   BILITOT 0.5 01/30/2018 0814   BILITOT 0.48 11/28/2016 0916   GFRNONAA >60 09/14/2018 1106   GFRNONAA >60 01/30/2018 0814   GFRAA >60 09/14/2018 1106   GFRAA >60 01/30/2018 0814    No results found for: SPEP, UPEP  Lab Results  Component Value Date   WBC 5.1 09/14/2018   NEUTROABS 3.0 09/14/2018   HGB 12.1 09/14/2018   HCT 36.4 09/14/2018   MCV 94.5 09/14/2018   PLT 132 (L) 09/14/2018      Chemistry      Component Value Date/Time   NA 134 (L) 09/14/2018 1106   NA 142 11/28/2016 0916   K 4.2 09/14/2018 1106   K 3.9 11/28/2016 0916   CL 100 09/14/2018 1106   CO2 25 09/14/2018 1106   CO2 28 11/28/2016 0916   BUN 23 09/14/2018 1106   BUN 16.8 11/28/2016 0916   CREATININE 0.82 09/14/2018 1106   CREATININE 0.77 01/30/2018 0814   CREATININE 0.8 11/28/2016 0916      Component Value Date/Time   CALCIUM 9.6 09/14/2018 1106   CALCIUM 10.5 (H) 11/28/2016 0916   ALKPHOS 46 09/14/2018 1106   ALKPHOS 50 11/28/2016 0916   AST 23 09/14/2018 1106   AST 17 01/30/2018 0814   AST 19 11/28/2016 0916   ALT 16 09/14/2018 1106   ALT 18 01/30/2018 0814   ALT 19 11/28/2016 0916   BILITOT 0.5 09/14/2018 1106   BILITOT 0.5 01/30/2018 0814   BILITOT 0.48 11/28/2016 0916       RADIOGRAPHIC STUDIES: I have reviewed multiple imaging studies with the patient I have personally reviewed the radiological images as listed and agreed with the findings in the report. Ct Chest W Contrast  Result Date: 09/15/2018 CLINICAL DATA:  Ovarian cancer, diagnosed 2016, with immunotherapy in progress. Status post hysterectomy. Chemotherapy complete. EXAM: CT CHEST, ABDOMEN, AND PELVIS WITH CONTRAST TECHNIQUE: Multidetector CT imaging of the chest, abdomen and pelvis was performed following the standard protocol during bolus administration of  intravenous contrast. CONTRAST:  152mL OMNIPAQUE IOHEXOL 300 MG/ML  SOLN COMPARISON:  05/31/2018 FINDINGS: CT CHEST FINDINGS Cardiovascular: The heart is normal in  size. No pericardial effusion. No evidence of thoracic aortic aneurysm. Mild Atherosclerotic calcifications of the aortic arch. Coronary atherosclerosis of the LAD and left circumflex. Right chest port terminates the cavoatrial junction. Mediastinum/Nodes: No suspicious mediastinal lymphadenopathy. 9 mm left thyroid nodule, likely benign. Lungs/Pleura: Small subpleural nodules in the posterior left upper lobe measuring 2-3 mm, grossly unchanged. Small subpleural nodules in the superior segment right lower lobe measuring up to 3 mm. While Fleischner Society guidelines do not apply in the setting of known malignancy, the stability is reassuring. No focal consolidation. No pleural effusion or pneumothorax. Musculoskeletal: No focal osseous lesions. CT ABDOMEN PELVIS FINDINGS Hepatobiliary: Liver is within normal limits. Cholelithiasis with a dominant 2.2 cm gallstone (series 2/image 54). No gallbladder wall thickening or pericholecystic fluid. Pancreas: Within normal limits. Spleen: Within normal limits. Adrenals/Urinary Tract: Adrenal glands are within normal limits. Kidneys are within normal limits.  No hydronephrosis. Bladder is within normal limits. Stomach/Bowel: Stomach is notable for a small hiatal hernia. No evidence of bowel obstruction. Appendix is not discretely visualized. Sigmoid diverticulosis, without evidence of diverticulitis. Vascular/Lymphatic: No evidence of abdominal aortic aneurysm. Atherosclerotic calcifications of the abdominal aorta and branch vessels. No suspicious abdominopelvic lymphadenopathy. Reproductive: Status post hysterectomy and bilateral salpingo-oophorectomy. Other: No abdominopelvic ascites. 5 x 11 mm soft tissue nodule beneath the left lower anterior abdominal wall (series 2/image 90), previously 6 x 10 mm, grossly  unchanged. Musculoskeletal: Mild degenerative changes at L5-S1. Stable probable benign bone islands in the left acetabulum. IMPRESSION: Status post hysterectomy and bilateral salpingo-oophorectomy. Stable soft tissue nodule beneath the left lower anterior abdominal wall, likely reflecting stable peritoneal disease. Scattered small subpleural nodules in the lungs bilaterally, measuring up to 3 mm, technically indeterminate although likely benign. Please note that Fleischner Society guidelines do not apply. Attention on follow-up is suggested. No evidence of new/progressive metastatic disease. Electronically Signed   By: Julian Hy M.D.   On: 09/15/2018 14:56   Ct Abdomen Pelvis W Contrast  Result Date: 09/15/2018 CLINICAL DATA:  Ovarian cancer, diagnosed 2016, with immunotherapy in progress. Status post hysterectomy. Chemotherapy complete. EXAM: CT CHEST, ABDOMEN, AND PELVIS WITH CONTRAST TECHNIQUE: Multidetector CT imaging of the chest, abdomen and pelvis was performed following the standard protocol during bolus administration of intravenous contrast. CONTRAST:  150mL OMNIPAQUE IOHEXOL 300 MG/ML  SOLN COMPARISON:  05/31/2018 FINDINGS: CT CHEST FINDINGS Cardiovascular: The heart is normal in size. No pericardial effusion. No evidence of thoracic aortic aneurysm. Mild Atherosclerotic calcifications of the aortic arch. Coronary atherosclerosis of the LAD and left circumflex. Right chest port terminates the cavoatrial junction. Mediastinum/Nodes: No suspicious mediastinal lymphadenopathy. 9 mm left thyroid nodule, likely benign. Lungs/Pleura: Small subpleural nodules in the posterior left upper lobe measuring 2-3 mm, grossly unchanged. Small subpleural nodules in the superior segment right lower lobe measuring up to 3 mm. While Fleischner Society guidelines do not apply in the setting of known malignancy, the stability is reassuring. No focal consolidation. No pleural effusion or pneumothorax.  Musculoskeletal: No focal osseous lesions. CT ABDOMEN PELVIS FINDINGS Hepatobiliary: Liver is within normal limits. Cholelithiasis with a dominant 2.2 cm gallstone (series 2/image 54). No gallbladder wall thickening or pericholecystic fluid. Pancreas: Within normal limits. Spleen: Within normal limits. Adrenals/Urinary Tract: Adrenal glands are within normal limits. Kidneys are within normal limits.  No hydronephrosis. Bladder is within normal limits. Stomach/Bowel: Stomach is notable for a small hiatal hernia. No evidence of bowel obstruction. Appendix is not discretely visualized. Sigmoid diverticulosis, without evidence of diverticulitis. Vascular/Lymphatic: No evidence of  abdominal aortic aneurysm. Atherosclerotic calcifications of the abdominal aorta and branch vessels. No suspicious abdominopelvic lymphadenopathy. Reproductive: Status post hysterectomy and bilateral salpingo-oophorectomy. Other: No abdominopelvic ascites. 5 x 11 mm soft tissue nodule beneath the left lower anterior abdominal wall (series 2/image 90), previously 6 x 10 mm, grossly unchanged. Musculoskeletal: Mild degenerative changes at L5-S1. Stable probable benign bone islands in the left acetabulum. IMPRESSION: Status post hysterectomy and bilateral salpingo-oophorectomy. Stable soft tissue nodule beneath the left lower anterior abdominal wall, likely reflecting stable peritoneal disease. Scattered small subpleural nodules in the lungs bilaterally, measuring up to 3 mm, technically indeterminate although likely benign. Please note that Fleischner Society guidelines do not apply. Attention on follow-up is suggested. No evidence of new/progressive metastatic disease. Electronically Signed   By: Julian Hy M.D.   On: 09/15/2018 14:56    All questions were answered. The patient knows to call the clinic with any problems, questions or concerns. No barriers to learning was detected.  I spent 15 minutes counseling the patient face to  face. The total time spent in the appointment was 20 minutes and more than 50% was on counseling and review of test results  Heath Lark, MD 09/18/2018 1:12 PM

## 2018-09-18 NOTE — Telephone Encounter (Signed)
Gave patient avs and calendar.  Patient needed mid day appts if possible.

## 2018-09-18 NOTE — Assessment & Plan Note (Signed)
Her pancytopenia is gradually improving with time away from treatment.  Observe only

## 2018-09-18 NOTE — Patient Instructions (Signed)
[facility] Discharge Instructions for Patients Receiving Chemotherapy  Today you received the following chemotherapy agents avastin  To help prevent nausea and vomiting after your treatment, we encourage you to take your nausea medication as directed.   If you develop nausea and vomiting that is not controlled by your nausea medication, call the clinic.   BELOW ARE SYMPTOMS THAT SHOULD BE REPORTED IMMEDIATELY:  *FEVER GREATER THAN 100.5 F  *CHILLS WITH OR WITHOUT FEVER  NAUSEA AND VOMITING THAT IS NOT CONTROLLED WITH YOUR NAUSEA MEDICATION  *UNUSUAL SHORTNESS OF BREATH  *UNUSUAL BRUISING OR BLEEDING  TENDERNESS IN MOUTH AND THROAT WITH OR WITHOUT PRESENCE OF ULCERS  *URINARY PROBLEMS  *BOWEL PROBLEMS  UNUSUAL RASH Items with * indicate a potential emergency and should be followed up as soon as possible.  Feel free to call the clinic should you have any questions or concerns. The clinic phone number is [phone].  Please show the CHEMO ALERT CARD at check-in to the Emergency Department and triage nurse.   

## 2018-09-18 NOTE — Assessment & Plan Note (Signed)
She has intermittent proteinuria With aggressive blood pressure medication and monitoring, blood pressure control is excellent, with rare occasional systolic blood pressure exceeding upper limits of normal She will continue current prescription blood pressure medications as directed. 

## 2018-09-18 NOTE — Assessment & Plan Note (Signed)
Clinically, she is doing well and has not experienced any signs or symptoms of cancer recurrence Her tumor marker is stable CT imaging showed no signs of cancer recurrence She will continue bevacizumab maintenance therapy I do not recommend repeat imaging study again until early next year

## 2018-09-25 ENCOUNTER — Telehealth: Payer: Self-pay

## 2018-09-25 NOTE — Telephone Encounter (Signed)
Per 11/5 vm return call. Spoke with patient concerning r/s her appointment from 1/31 to 12/31 at patient agreed time. Patient has the new appointment dates on MyChart.

## 2018-10-09 ENCOUNTER — Inpatient Hospital Stay: Payer: Medicare Other | Attending: Gynecology

## 2018-10-09 ENCOUNTER — Inpatient Hospital Stay: Payer: Medicare Other

## 2018-10-09 VITALS — BP 128/60 | HR 70 | Temp 98.0°F | Resp 16

## 2018-10-09 DIAGNOSIS — C561 Malignant neoplasm of right ovary: Secondary | ICD-10-CM

## 2018-10-09 DIAGNOSIS — Z5112 Encounter for antineoplastic immunotherapy: Secondary | ICD-10-CM | POA: Diagnosis present

## 2018-10-09 DIAGNOSIS — Z7189 Other specified counseling: Secondary | ICD-10-CM

## 2018-10-09 LAB — COMPREHENSIVE METABOLIC PANEL
ALBUMIN: 3.8 g/dL (ref 3.5–5.0)
ALT: 14 U/L (ref 0–44)
ANION GAP: 9 (ref 5–15)
AST: 21 U/L (ref 15–41)
Alkaline Phosphatase: 46 U/L (ref 38–126)
BILIRUBIN TOTAL: 0.4 mg/dL (ref 0.3–1.2)
BUN: 23 mg/dL (ref 8–23)
CO2: 26 mmol/L (ref 22–32)
Calcium: 9.9 mg/dL (ref 8.9–10.3)
Chloride: 106 mmol/L (ref 98–111)
Creatinine, Ser: 0.93 mg/dL (ref 0.44–1.00)
GFR calc Af Amer: >60 mL/min (ref >60)
GFR, EST NON AFRICAN AMERICAN: 58 mL/min — AB (ref >60)
GLUCOSE: 103 mg/dL — AB (ref 70–99)
POTASSIUM: 4 mmol/L (ref 3.5–5.1)
Sodium: 141 mmol/L (ref 135–145)
TOTAL PROTEIN: 7.1 g/dL (ref 6.5–8.1)

## 2018-10-09 LAB — CBC WITH DIFFERENTIAL/PLATELET
Abs Immature Granulocytes: 0 10*3/uL (ref 0.00–0.07)
BASOS ABS: 0 10*3/uL (ref 0.0–0.1)
BASOS PCT: 1 %
EOS ABS: 0.1 10*3/uL (ref 0.0–0.5)
EOS PCT: 3 %
HEMATOCRIT: 35.2 % — AB (ref 36.0–46.0)
Hemoglobin: 11.7 g/dL — ABNORMAL LOW (ref 12.0–15.0)
IMMATURE GRANULOCYTES: 0 %
LYMPHS ABS: 1.2 10*3/uL (ref 0.7–4.0)
Lymphocytes Relative: 29 %
MCH: 31.1 pg (ref 26.0–34.0)
MCHC: 33.2 g/dL (ref 30.0–36.0)
MCV: 93.6 fL (ref 80.0–100.0)
Monocytes Absolute: 0.5 10*3/uL (ref 0.1–1.0)
Monocytes Relative: 11 %
NEUTROS PCT: 56 %
NRBC: 0 % (ref 0.0–0.2)
Neutro Abs: 2.4 10*3/uL (ref 1.7–7.7)
PLATELETS: 119 10*3/uL — AB (ref 150–400)
RBC: 3.76 MIL/uL — ABNORMAL LOW (ref 3.87–5.11)
RDW: 13.1 % (ref 11.5–15.5)
WBC: 4.2 10*3/uL (ref 4.0–10.5)

## 2018-10-09 LAB — TOTAL PROTEIN, URINE DIPSTICK

## 2018-10-09 MED ORDER — SODIUM CHLORIDE 0.9 % IV SOLN
15.0000 mg/kg | Freq: Once | INTRAVENOUS | Status: AC
  Administered 2018-10-09: 1000 mg via INTRAVENOUS
  Filled 2018-10-09: qty 32

## 2018-10-09 MED ORDER — SODIUM CHLORIDE 0.9% FLUSH
10.0000 mL | INTRAVENOUS | Status: DC | PRN
  Administered 2018-10-09: 10 mL
  Filled 2018-10-09: qty 10

## 2018-10-09 MED ORDER — HEPARIN SOD (PORK) LOCK FLUSH 100 UNIT/ML IV SOLN
500.0000 [IU] | Freq: Once | INTRAVENOUS | Status: AC | PRN
  Administered 2018-10-09: 500 [IU]
  Filled 2018-10-09: qty 5

## 2018-10-09 MED ORDER — SODIUM CHLORIDE 0.9% FLUSH
10.0000 mL | Freq: Once | INTRAVENOUS | Status: AC
  Administered 2018-10-09: 10 mL
  Filled 2018-10-09: qty 10

## 2018-10-09 MED ORDER — SODIUM CHLORIDE 0.9 % IV SOLN
Freq: Once | INTRAVENOUS | Status: AC
  Administered 2018-10-09: 14:00:00 via INTRAVENOUS
  Filled 2018-10-09: qty 250

## 2018-10-09 NOTE — Patient Instructions (Signed)
[facility] Discharge Instructions for Patients Receiving Chemotherapy  Today you received the following chemotherapy agents avastin  To help prevent nausea and vomiting after your treatment, we encourage you to take your nausea medication as directed.   If you develop nausea and vomiting that is not controlled by your nausea medication, call the clinic.   BELOW ARE SYMPTOMS THAT SHOULD BE REPORTED IMMEDIATELY:  *FEVER GREATER THAN 100.5 F  *CHILLS WITH OR WITHOUT FEVER  NAUSEA AND VOMITING THAT IS NOT CONTROLLED WITH YOUR NAUSEA MEDICATION  *UNUSUAL SHORTNESS OF BREATH  *UNUSUAL BRUISING OR BLEEDING  TENDERNESS IN MOUTH AND THROAT WITH OR WITHOUT PRESENCE OF ULCERS  *URINARY PROBLEMS  *BOWEL PROBLEMS  UNUSUAL RASH Items with * indicate a potential emergency and should be followed up as soon as possible.  Feel free to call the clinic should you have any questions or concerns. The clinic phone number is [phone].  Please show the CHEMO ALERT CARD at check-in to the Emergency Department and triage nurse.   

## 2018-10-10 LAB — CA 125: Cancer Antigen (CA) 125: 22.3 U/mL (ref 0.0–38.1)

## 2018-10-30 ENCOUNTER — Inpatient Hospital Stay: Payer: Medicare Other

## 2018-10-30 ENCOUNTER — Inpatient Hospital Stay: Payer: Medicare Other | Attending: Gynecology

## 2018-10-30 ENCOUNTER — Other Ambulatory Visit: Payer: Self-pay | Admitting: Hematology and Oncology

## 2018-10-30 ENCOUNTER — Inpatient Hospital Stay: Payer: Medicare Other | Admitting: Hematology and Oncology

## 2018-10-30 ENCOUNTER — Telehealth: Payer: Self-pay | Admitting: Hematology and Oncology

## 2018-10-30 ENCOUNTER — Encounter: Payer: Self-pay | Admitting: Hematology and Oncology

## 2018-10-30 VITALS — HR 70

## 2018-10-30 DIAGNOSIS — D61818 Other pancytopenia: Secondary | ICD-10-CM

## 2018-10-30 DIAGNOSIS — Z79899 Other long term (current) drug therapy: Secondary | ICD-10-CM | POA: Insufficient documentation

## 2018-10-30 DIAGNOSIS — R809 Proteinuria, unspecified: Secondary | ICD-10-CM

## 2018-10-30 DIAGNOSIS — Z5112 Encounter for antineoplastic immunotherapy: Secondary | ICD-10-CM | POA: Insufficient documentation

## 2018-10-30 DIAGNOSIS — I10 Essential (primary) hypertension: Secondary | ICD-10-CM

## 2018-10-30 DIAGNOSIS — Z7189 Other specified counseling: Secondary | ICD-10-CM

## 2018-10-30 DIAGNOSIS — C561 Malignant neoplasm of right ovary: Secondary | ICD-10-CM | POA: Insufficient documentation

## 2018-10-30 LAB — CBC WITH DIFFERENTIAL/PLATELET
Abs Immature Granulocytes: 0.01 10*3/uL (ref 0.00–0.07)
Basophils Absolute: 0 10*3/uL (ref 0.0–0.1)
Basophils Relative: 1 %
EOS PCT: 4 %
Eosinophils Absolute: 0.2 10*3/uL (ref 0.0–0.5)
HCT: 37.1 % (ref 36.0–46.0)
Hemoglobin: 12.3 g/dL (ref 12.0–15.0)
Immature Granulocytes: 0 %
Lymphocytes Relative: 27 %
Lymphs Abs: 1.3 10*3/uL (ref 0.7–4.0)
MCH: 31.1 pg (ref 26.0–34.0)
MCHC: 33.2 g/dL (ref 30.0–36.0)
MCV: 93.9 fL (ref 80.0–100.0)
Monocytes Absolute: 0.5 10*3/uL (ref 0.1–1.0)
Monocytes Relative: 10 %
Neutro Abs: 2.9 10*3/uL (ref 1.7–7.7)
Neutrophils Relative %: 58 %
Platelets: 132 10*3/uL — ABNORMAL LOW (ref 150–400)
RBC: 3.95 MIL/uL (ref 3.87–5.11)
RDW: 12.8 % (ref 11.5–15.5)
WBC: 4.9 10*3/uL (ref 4.0–10.5)
nRBC: 0 % (ref 0.0–0.2)

## 2018-10-30 LAB — COMPREHENSIVE METABOLIC PANEL
ALT: 14 U/L (ref 0–44)
AST: 22 U/L (ref 15–41)
Albumin: 4 g/dL (ref 3.5–5.0)
Alkaline Phosphatase: 48 U/L (ref 38–126)
Anion gap: 11 (ref 5–15)
BUN: 21 mg/dL (ref 8–23)
CO2: 24 mmol/L (ref 22–32)
Calcium: 9.8 mg/dL (ref 8.9–10.3)
Chloride: 105 mmol/L (ref 98–111)
Creatinine, Ser: 0.93 mg/dL (ref 0.44–1.00)
GFR calc Af Amer: >60 mL/min (ref >60)
GFR calc non Af Amer: 59 mL/min — ABNORMAL LOW (ref >60)
Glucose, Bld: 92 mg/dL (ref 70–99)
Potassium: 4.4 mmol/L (ref 3.5–5.1)
Sodium: 140 mmol/L (ref 135–145)
Total Bilirubin: 0.5 mg/dL (ref 0.3–1.2)
Total Protein: 7.3 g/dL (ref 6.5–8.1)

## 2018-10-30 LAB — TOTAL PROTEIN, URINE DIPSTICK: Protein, ur: NEGATIVE mg/dL

## 2018-10-30 MED ORDER — SODIUM CHLORIDE 0.9 % IV SOLN
15.0000 mg/kg | Freq: Once | INTRAVENOUS | Status: AC
  Administered 2018-10-30: 1000 mg via INTRAVENOUS
  Filled 2018-10-30: qty 32

## 2018-10-30 MED ORDER — HEPARIN SOD (PORK) LOCK FLUSH 100 UNIT/ML IV SOLN
500.0000 [IU] | Freq: Once | INTRAVENOUS | Status: AC
  Administered 2018-10-30: 500 [IU]
  Filled 2018-10-30: qty 5

## 2018-10-30 MED ORDER — SODIUM CHLORIDE 0.9% FLUSH
10.0000 mL | Freq: Once | INTRAVENOUS | Status: AC
  Administered 2018-10-30: 10 mL
  Filled 2018-10-30: qty 10

## 2018-10-30 MED ORDER — SODIUM CHLORIDE 0.9 % IV SOLN
Freq: Once | INTRAVENOUS | Status: AC
  Administered 2018-10-30: 13:00:00 via INTRAVENOUS
  Filled 2018-10-30: qty 250

## 2018-10-30 NOTE — Progress Notes (Signed)
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  Observe only She is not symptomatic  Essential hypertension She has intermittent proteinuria With aggressive blood pressure medication and monitoring, blood pressure control is excellent, with rare occasional systolic blood pressure exceeding upper limits of normal She will continue current prescription blood pressure medications as directed.   No orders of the defined types were placed in this encounter.   INTERVAL HISTORY: Please see below for problem oriented charting. She returns for further follow-up She feels well Denies side effects from recent treatment Denies abdominal bloating, pain, nausea or changes in bowel habits Her blood pressure control is stable Denies recent infection, fever or chills  SUMMARY OF ONCOLOGIC HISTORY: Oncology History   Neg genetics. 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 In a postmenopausal female, consider continued annual imaging followup with CT or MRI versus surgical evaluation.  Colonic diverticulosis. No radiographic  evidence of diverticulitis.  Cholelithiasis.  No radiographic evidence of cholecystitis.  Small hiatal hernia.    07/30/2015 Pathology Results    Outside pathology showed right ovary with high-grade serous carcinoma, 2 cm in maximum dimension.  The tumor involves serosal surface of the right ovary and adjacent right fallopian tube.  Cervix and endometrium were within normal limits. ER positive Peritoneal washing was positive.    07/30/2015 Surgery    SHe underwent laparoscopic-assisted total vaginal hysterectomy, bilateral salpingo-oophorectomy    07/30/2015 Pathology Results    PERITONEAL WASHING PELVIC (SPECIMEN 1 OF 1, COLLECTED ON 07/30/2015): MALIGNANT CELLS CONSISTENT WITH HIGH GRADE CARCINOMA    08/13/2015 Tumor Marker    Patient's tumor was tested for the following markers: CA-125 Results of the tumor marker test revealed 96    08/25/2015 - 12/08/2015 Chemotherapy    The patient had 6 cycles of carboplatin and taxol    09/07/2015 Tumor Marker    Patient's tumor was tested for the following markers: CA-125 Results of the tumor marker test revealed 51    10/05/2015 Tumor Marker    Patient's tumor was tested for the following markers: CA-125 Results of the tumor marker test revealed 29    11/09/2015 Tumor Marker    Patient's tumor was tested for the following markers: CA-125 Results of the tumor marker test revealed 44    12/08/2015 Tumor Marker    Patient's tumor was tested for the following markers: CA-125 Results of the tumor marker test revealed 30    12/15/2015 Genetic Testing    Genetics testing normal by [facility] Breast Ovarian panel 12-15-15    01/11/2016 Imaging    1. The only finding of note are several adjacent peripheral abnormal  hypodensities along the anterior superior spleen margin, differential diagnostic considerations including small peripheral interval splenic infarcts, splenic injury with subcapsular a fluid collections, or less likely metastatic disease to the  splenic margin. This likely merits observation. 2. Other imaging findings of potential clinical significance: Small type 1 hiatal hernia. Aortoiliac atherosclerotic vascular disease. Lower lumbar spondylosis and degenerative disc disease. Gallstones with potential mild gallbladder wall thickening.    03/07/2016 Tumor Marker    Patient's tumor was tested for the following markers: CA-125 Results of the tumor marker test revealed 24.2    04/06/2016 Imaging    1. No evidence of a ovarian cancer metastasis. 2. No ascites. 3. Post hysterectomy and oophorectomy. 4. Cholelithiasis with several large gallstones    04/06/2016 Tumor Marker    Patient's tumor was tested for the following markers: CA-125 Results of the tumor marker test revealed 22.8    05/30/2016 Tumor Marker    Patient's tumor was tested for the following markers: CA-125 Results of the tumor marker test revealed 21    09/07/2016 Tumor Marker    Patient's tumor was tested for the following markers: CA-125 Results of the tumor marker test revealed 22.4    11/28/2016 Tumor Marker    Patient's tumor was tested for the following markers: CA-125 Results of the tumor marker test revealed 18.8    02/23/2017 Tumor Marker    Patient's tumor was tested for the following markers: CA-125 Results of the tumor marker test revealed 19.1    06/02/2017 Tumor Marker    Patient's tumor was tested for the following markers: CA-125 Results of the tumor marker test revealed 18.3    08/24/2017 Mammogram    Pt reports mammogram complete    08/28/2017 Tumor Marker    Patient's tumor was tested for the following markers: CA-125 Results of the tumor marker test revealed 21.1    12/01/2017 Tumor Marker    Patient's tumor was tested for the following markers: CA-125 Results of the tumor marker test revealed 31.2    12/13/2017 Imaging    New 2.7 cm peritoneal soft tissue mass in anterior left lower quadrant, consistent with metastatic disease.  No  other sites of metastatic disease identified.  Incidental findings including: Cholelithiasis. Colonic diverticulosis. Tiny hiatal hernia. Aortic atherosclerosis.    01/09/2018 - 05/07/2018 Chemotherapy    The patient had carboplatin and taxol; After 2nd cycle, she developed carboplatin allergy. She received carboplatin desensitization protocol at [facility] for final 4 cycles, completed by 6/17. Avastin was added from 04/16/18 onwards    01/30/2018 Adverse Reaction    Potential carboplatin allergy is suspected. She received half the dose of prescribed carboplatin on cycle 2    05/31/2018 Tumor Marker    Patient's tumor was tested for the following markers: CA-125 Results of the tumor marker test revealed 26    05/31/2018 Imaging    1. Peritoneal soft tissue nodule identified on the previous study has decreased in the interval the. No progressive findings in the abdomen or pelvis on today's study to suggest disease progression. 2. Tiny pulmonary nodules, likely benign. Attention on follow-up recommended. 3. Cholelithiasis. 4.  Aortic Atherosclerois (ICD10-170.0) 5. Tiny hiatal hernia.      06/04/2018 -  Chemotherapy    The patient is placed on Avastin for maintenance    06/25/2018 Tumor Marker    Patient's tumor was tested for the following markers: CA-125 Results of the tumor marker test revealed 22.5    08/06/2018 Tumor Marker    Patient's tumor  was tested for the following markers: CA-125 Results of the tumor marker test revealed 20.7    09/14/2018 Imaging    Status post hysterectomy and bilateral salpingo-oophorectomy.  Stable soft tissue nodule beneath the left lower anterior abdominal wall, likely reflecting stable peritoneal disease.  Scattered small subpleural nodules in the lungs bilaterally, measuring up to 3 mm, technically indeterminate although likely benign. Please note that Fleischner Society guidelines do not apply. Attention on follow-up is suggested.  No evidence of  new/progressive metastatic disease.    09/14/2018 Tumor Marker    Patient's tumor was tested for the following markers: CA-125 Results of the tumor marker test revealed 21.3     REVIEW OF SYSTEMS:   Constitutional: Denies fevers, chills or abnormal weight loss Eyes: Denies blurriness of vision Ears, nose, mouth, throat, and face: Denies mucositis or sore throat Respiratory: Denies cough, dyspnea or wheezes Cardiovascular: Denies palpitation, chest discomfort or lower extremity swelling Gastrointestinal:  Denies nausea, heartburn or change in bowel habits Skin: Denies abnormal skin rashes Lymphatics: Denies new lymphadenopathy or easy bruising Neurological:Denies numbness, tingling or new weaknesses Behavioral/Psych: Mood is stable, no new changes  All other systems were reviewed with the patient and are negative.  I have reviewed the past medical history, past surgical history, social history and family history with the patient and they are unchanged from previous note.  ALLERGIES:  is allergic to carboplatin.  MEDICATIONS:  Current Outpatient Medications  Medication Sig Dispense Refill  . amLODipine (NORVASC) 5 MG tablet Take 1 tablet (5 mg total) by mouth daily. 30 tablet 0  . atorvastatin (LIPITOR) 10 MG tablet Take 1 tablet (10 mg total) by mouth daily. 30 tablet 2  . Coenzyme Q10 (CO Q 10) 100 MG CAPS Take 1 capsule by mouth daily.     . Glucosamine-Chondroit-Vit C-Mn (GLUCOSAMINE 1500 COMPLEX PO) Take 1 capsule by mouth 2 (two) times daily.    . hydrochlorothiazide (HYDRODIURIL) 25 MG tablet Take 1 tablet (25 mg total) by mouth daily. 30 tablet 9  . lidocaine-prilocaine (EMLA) cream Apply to affected area once 30 g 3  . lisinopril (PRINIVIL,ZESTRIL) 30 MG tablet Take 1 tablet (30 mg total) by mouth daily. 30 tablet 11  . loratadine (CLARITIN) 10 MG tablet Take 10 mg by mouth daily as needed for allergies (hives).    . Multiple Vitamin (MULTIVITAMIN) capsule Take 1 capsule by  mouth daily.     . Omega-3 Fatty Acids (OMEGA-3 FISH OIL) 300 MG CAPS Take 1 capsule by mouth daily.     . ondansetron (ZOFRAN) 8 MG tablet Take 1 tablet (8 mg total) by mouth 2 (two) times daily as needed for refractory nausea / vomiting. Start on day 3 after chemo. 30 tablet 1  . Polyethyl Glycol-Propyl Glycol (SYSTANE ULTRA OP) Apply 1 drop to eye 2 (two) times daily at 10 AM and 5 PM.    . Probiotic Product (PROBIOTIC ADVANCED PO) Take 1 capsule by mouth daily.    . prochlorperazine (COMPAZINE) 10 MG tablet Take 1 tablet (10 mg total) by mouth every 6 (six) hours as needed (Nausea or vomiting). 60 tablet 1  . vitamin E 400 UNIT capsule Take 400 Units by mouth every evening.      No current facility-administered medications for this visit.    Facility-Administered Medications Ordered in Other Visits  Medication Dose Route Frequency Provider Last Rate Last Dose  . bevacizumab (AVASTIN) 1,000 mg in sodium chloride 0.9 % 100 mL chemo infusion  15 mg/kg (  Treatment Plan Recorded) Intravenous Once Heath Lark, MD 280 mL/hr at 10/30/18 1412 1,000 mg at 10/30/18 1412    PHYSICAL EXAMINATION: ECOG PERFORMANCE STATUS: 0 - Asymptomatic  Vitals:   10/30/18 1239  BP: 139/62  Pulse: (!) 10  Resp: 18  Temp: 98.3 F (36.8 C)  SpO2: 100%   Filed Weights   10/30/18 1239  Weight: 138 lb 11.2 oz (62.9 kg)    GENERAL:alert, no distress and comfortable SKIN: skin color, texture, turgor are normal, no rashes or significant lesions EYES: normal, Conjunctiva are pink and non-injected, sclera clear OROPHARYNX:no exudate, no erythema and lips, buccal mucosa, and tongue normal  NECK: supple, thyroid normal size, non-tender, without nodularity LYMPH:  no palpable lymphadenopathy in the cervical, axillary or inguinal LUNGS: clear to auscultation and percussion with normal breathing effort HEART: regular rate & rhythm and no murmurs and no lower extremity edema ABDOMEN:abdomen soft, non-tender and normal  bowel sounds Musculoskeletal:no cyanosis of digits and no clubbing  NEURO: alert & oriented x 3 with fluent speech, no focal motor/sensory deficits  LABORATORY DATA:  I have reviewed the data as listed    Component Value Date/Time   NA 140 10/30/2018 1149   NA 142 11/28/2016 0916   K 4.4 10/30/2018 1149   K 3.9 11/28/2016 0916   CL 105 10/30/2018 1149   CO2 24 10/30/2018 1149   CO2 28 11/28/2016 0916   GLUCOSE 92 10/30/2018 1149   GLUCOSE 78 11/28/2016 0916   BUN 21 10/30/2018 1149   BUN 16.8 11/28/2016 0916   CREATININE 0.93 10/30/2018 1149   CREATININE 0.77 01/30/2018 0814   CREATININE 0.8 11/28/2016 0916   CALCIUM 9.8 10/30/2018 1149   CALCIUM 10.5 (H) 11/28/2016 0916   PROT 7.3 10/30/2018 1149   PROT 7.3 11/28/2016 0916   ALBUMIN 4.0 10/30/2018 1149   ALBUMIN 4.3 11/28/2016 0916   AST 22 10/30/2018 1149   AST 17 01/30/2018 0814   AST 19 11/28/2016 0916   ALT 14 10/30/2018 1149   ALT 18 01/30/2018 0814   ALT 19 11/28/2016 0916   ALKPHOS 48 10/30/2018 1149   ALKPHOS 50 11/28/2016 0916   BILITOT 0.5 10/30/2018 1149   BILITOT 0.5 01/30/2018 0814   BILITOT 0.48 11/28/2016 0916   GFRNONAA 59 (L) 10/30/2018 1149   GFRNONAA >60 01/30/2018 0814   GFRAA >60 10/30/2018 1149   GFRAA >60 01/30/2018 0814    No results found for: SPEP, UPEP  Lab Results  Component Value Date   WBC 4.9 10/30/2018   NEUTROABS 2.9 10/30/2018   HGB 12.3 10/30/2018   HCT 37.1 10/30/2018   MCV 93.9 10/30/2018   PLT 132 (L) 10/30/2018      Chemistry      Component Value Date/Time   NA 140 10/30/2018 1149   NA 142 11/28/2016 0916   K 4.4 10/30/2018 1149   K 3.9 11/28/2016 0916   CL 105 10/30/2018 1149   CO2 24 10/30/2018 1149   CO2 28 11/28/2016 0916   BUN 21 10/30/2018 1149   BUN 16.8 11/28/2016 0916   CREATININE 0.93 10/30/2018 1149   CREATININE 0.77 01/30/2018 0814   CREATININE 0.8 11/28/2016 0916      Component Value Date/Time   CALCIUM 9.8 10/30/2018 1149   CALCIUM 10.5  (H) 11/28/2016 0916   ALKPHOS 48 10/30/2018 1149   ALKPHOS 50 11/28/2016 0916   AST 22 10/30/2018 1149   AST 17 01/30/2018 0814   AST 19 11/28/2016 0916   ALT 14  10/30/2018 1149   ALT 18 01/30/2018 0814   ALT 19 11/28/2016 0916   BILITOT 0.5 10/30/2018 1149   BILITOT 0.5 01/30/2018 0814   BILITOT 0.48 11/28/2016 0916       All questions were answered. The patient knows to call the clinic with any problems, questions or concerns. No barriers to learning was detected.  I spent 15 minutes counseling the patient face to face. The total time spent in the appointment was 20 minutes and more than 50% was on counseling and review of test results  Heath Lark, MD 10/30/2018 2:17 PM

## 2018-10-30 NOTE — Assessment & Plan Note (Signed)
She has intermittent proteinuria With aggressive blood pressure medication and monitoring, blood pressure control is excellent, with rare occasional systolic blood pressure exceeding upper limits of normal She will continue current prescription blood pressure medications as directed.

## 2018-10-30 NOTE — Telephone Encounter (Signed)
Gave patient avs and calendar. Patient requests mid day to avoid traffic.

## 2018-10-30 NOTE — Assessment & Plan Note (Signed)
Clinically, she is doing well and has not experienced any signs or symptoms of cancer recurrence Her tumor marker is stable Her last CT imaging showed no signs of cancer recurrence She will continue bevacizumab maintenance therapy I do not recommend repeat imaging study again until early next year or sooner unless tumor marker is elevated

## 2018-10-30 NOTE — Assessment & Plan Note (Signed)
Her pancytopenia is gradually improving with time away from treatment.  Observe only She is not symptomatic

## 2018-10-30 NOTE — Patient Instructions (Signed)
[facility] Discharge Instructions for Patients Receiving Chemotherapy  Today you received the following chemotherapy agents avastin  To help prevent nausea and vomiting after your treatment, we encourage you to take your nausea medication as directed.   If you develop nausea and vomiting that is not controlled by your nausea medication, call the clinic.   BELOW ARE SYMPTOMS THAT SHOULD BE REPORTED IMMEDIATELY:  *FEVER GREATER THAN 100.5 F  *CHILLS WITH OR WITHOUT FEVER  NAUSEA AND VOMITING THAT IS NOT CONTROLLED WITH YOUR NAUSEA MEDICATION  *UNUSUAL SHORTNESS OF BREATH  *UNUSUAL BRUISING OR BLEEDING  TENDERNESS IN MOUTH AND THROAT WITH OR WITHOUT PRESENCE OF ULCERS  *URINARY PROBLEMS  *BOWEL PROBLEMS  UNUSUAL RASH Items with * indicate a potential emergency and should be followed up as soon as possible.  Feel free to call the clinic should you have any questions or concerns. The clinic phone number is [phone].  Please show the CHEMO ALERT CARD at check-in to the Emergency Department and triage nurse.   

## 2018-10-31 ENCOUNTER — Telehealth: Payer: Self-pay

## 2018-10-31 LAB — CA 125: Cancer Antigen (CA) 125: 20.8 U/mL (ref 0.0–38.1)

## 2018-10-31 NOTE — Telephone Encounter (Signed)
-----   Message from Heath Lark, MD sent at 10/31/2018  9:09 AM EST ----- Regarding: CA-125 ok   ----- Message ----- From: Interface, Lab In [facility] Sent: 10/30/2018  12:09 PM EST To: Heath Lark, MD

## 2018-10-31 NOTE — Telephone Encounter (Signed)
Called and given below message. She verbalized understanding. 

## 2018-11-20 ENCOUNTER — Inpatient Hospital Stay: Payer: Medicare Other

## 2018-11-20 VITALS — BP 146/59 | HR 69 | Temp 98.0°F | Resp 16

## 2018-11-20 DIAGNOSIS — C561 Malignant neoplasm of right ovary: Secondary | ICD-10-CM

## 2018-11-20 DIAGNOSIS — Z5112 Encounter for antineoplastic immunotherapy: Secondary | ICD-10-CM | POA: Diagnosis not present

## 2018-11-20 DIAGNOSIS — Z7189 Other specified counseling: Secondary | ICD-10-CM

## 2018-11-20 LAB — CBC WITH DIFFERENTIAL/PLATELET
Abs Immature Granulocytes: 0.01 10*3/uL (ref 0.00–0.07)
Basophils Absolute: 0 10*3/uL (ref 0.0–0.1)
Basophils Relative: 1 %
Eosinophils Absolute: 0.2 10*3/uL (ref 0.0–0.5)
Eosinophils Relative: 5 %
HCT: 37.1 % (ref 36.0–46.0)
HEMOGLOBIN: 12.1 g/dL (ref 12.0–15.0)
Immature Granulocytes: 0 %
LYMPHS PCT: 28 %
Lymphs Abs: 1.2 10*3/uL (ref 0.7–4.0)
MCH: 30.6 pg (ref 26.0–34.0)
MCHC: 32.6 g/dL (ref 30.0–36.0)
MCV: 93.9 fL (ref 80.0–100.0)
Monocytes Absolute: 0.5 10*3/uL (ref 0.1–1.0)
Monocytes Relative: 11 %
Neutro Abs: 2.5 10*3/uL (ref 1.7–7.7)
Neutrophils Relative %: 55 %
Platelets: 125 10*3/uL — ABNORMAL LOW (ref 150–400)
RBC: 3.95 MIL/uL (ref 3.87–5.11)
RDW: 12.6 % (ref 11.5–15.5)
WBC: 4.5 10*3/uL (ref 4.0–10.5)
nRBC: 0 % (ref 0.0–0.2)

## 2018-11-20 LAB — COMPREHENSIVE METABOLIC PANEL
ALK PHOS: 52 U/L (ref 38–126)
ALT: 17 U/L (ref 0–44)
AST: 24 U/L (ref 15–41)
Albumin: 4.1 g/dL (ref 3.5–5.0)
Anion gap: 12 (ref 5–15)
BUN: 23 mg/dL (ref 8–23)
CO2: 23 mmol/L (ref 22–32)
CREATININE: 0.84 mg/dL (ref 0.44–1.00)
Calcium: 10 mg/dL (ref 8.9–10.3)
Chloride: 106 mmol/L (ref 98–111)
GFR calc Af Amer: >60 mL/min (ref >60)
GFR calc non Af Amer: >60 mL/min (ref >60)
Glucose, Bld: 91 mg/dL (ref 70–99)
Potassium: 4 mmol/L (ref 3.5–5.1)
Sodium: 141 mmol/L (ref 135–145)
Total Bilirubin: 0.5 mg/dL (ref 0.3–1.2)
Total Protein: 7.4 g/dL (ref 6.5–8.1)

## 2018-11-20 LAB — TOTAL PROTEIN, URINE DIPSTICK: Protein, ur: <30 mg/dL — AB

## 2018-11-20 MED ORDER — SODIUM CHLORIDE 0.9 % IV SOLN
Freq: Once | INTRAVENOUS | Status: AC
  Administered 2018-11-20: 15:00:00 via INTRAVENOUS
  Filled 2018-11-20: qty 250

## 2018-11-20 MED ORDER — HEPARIN SOD (PORK) LOCK FLUSH 100 UNIT/ML IV SOLN
500.0000 [IU] | Freq: Once | INTRAVENOUS | Status: AC | PRN
  Administered 2018-11-20: 500 [IU]
  Filled 2018-11-20: qty 5

## 2018-11-20 MED ORDER — SODIUM CHLORIDE 0.9 % IV SOLN
15.0000 mg/kg | Freq: Once | INTRAVENOUS | Status: AC
  Administered 2018-11-20: 1000 mg via INTRAVENOUS
  Filled 2018-11-20: qty 32

## 2018-11-20 MED ORDER — SODIUM CHLORIDE 0.9% FLUSH
10.0000 mL | INTRAVENOUS | Status: DC | PRN
  Administered 2018-11-20: 10 mL
  Filled 2018-11-20: qty 10

## 2018-11-20 NOTE — Patient Instructions (Signed)
[facility] Discharge Instructions for Patients Receiving Chemotherapy  Today you received the following chemotherapy agents Avastin  To help prevent nausea and vomiting after your treatment, we encourage you to take your nausea medication as directed   If you develop nausea and vomiting that is not controlled by your nausea medication, call the clinic.   BELOW ARE SYMPTOMS THAT SHOULD BE REPORTED IMMEDIATELY:  *FEVER GREATER THAN 100.5 F  *CHILLS WITH OR WITHOUT FEVER  NAUSEA AND VOMITING THAT IS NOT CONTROLLED WITH YOUR NAUSEA MEDICATION  *UNUSUAL SHORTNESS OF BREATH  *UNUSUAL BRUISING OR BLEEDING  TENDERNESS IN MOUTH AND THROAT WITH OR WITHOUT PRESENCE OF ULCERS  *URINARY PROBLEMS  *BOWEL PROBLEMS  UNUSUAL RASH Items with * indicate a potential emergency and should be followed up as soon as possible.  Feel free to call the clinic should you have any questions or concerns. The clinic phone number is [phone].  Please show the CHEMO ALERT CARD at check-in to the Emergency Department and triage nurse.   

## 2018-12-11 ENCOUNTER — Inpatient Hospital Stay: Payer: Medicare Other | Attending: Gynecology

## 2018-12-11 ENCOUNTER — Encounter: Payer: Self-pay | Admitting: Hematology and Oncology

## 2018-12-11 ENCOUNTER — Inpatient Hospital Stay: Payer: Medicare Other

## 2018-12-11 ENCOUNTER — Inpatient Hospital Stay: Payer: Medicare Other | Admitting: Hematology and Oncology

## 2018-12-11 ENCOUNTER — Telehealth: Payer: Self-pay | Admitting: Hematology and Oncology

## 2018-12-11 VITALS — BP 146/52 | HR 71 | Temp 98.1°F | Resp 18 | Ht 62.75 in | Wt 142.2 lb

## 2018-12-11 DIAGNOSIS — C561 Malignant neoplasm of right ovary: Secondary | ICD-10-CM

## 2018-12-11 DIAGNOSIS — R809 Proteinuria, unspecified: Secondary | ICD-10-CM | POA: Diagnosis not present

## 2018-12-11 DIAGNOSIS — D61818 Other pancytopenia: Secondary | ICD-10-CM | POA: Insufficient documentation

## 2018-12-11 DIAGNOSIS — I10 Essential (primary) hypertension: Secondary | ICD-10-CM | POA: Insufficient documentation

## 2018-12-11 LAB — CBC WITH DIFFERENTIAL/PLATELET
Abs Immature Granulocytes: 0.01 10*3/uL (ref 0.00–0.07)
Basophils Absolute: 0.1 10*3/uL (ref 0.0–0.1)
Basophils Relative: 1 %
EOS PCT: 6 %
Eosinophils Absolute: 0.2 10*3/uL (ref 0.0–0.5)
HCT: 37 % (ref 36.0–46.0)
Hemoglobin: 12.3 g/dL (ref 12.0–15.0)
Immature Granulocytes: 0 %
LYMPHS PCT: 28 %
Lymphs Abs: 1 10*3/uL (ref 0.7–4.0)
MCH: 31.1 pg (ref 26.0–34.0)
MCHC: 33.2 g/dL (ref 30.0–36.0)
MCV: 93.4 fL (ref 80.0–100.0)
Monocytes Absolute: 0.4 10*3/uL (ref 0.1–1.0)
Monocytes Relative: 12 %
Neutro Abs: 2 10*3/uL (ref 1.7–7.7)
Neutrophils Relative %: 53 %
Platelets: 118 10*3/uL — ABNORMAL LOW (ref 150–400)
RBC: 3.96 MIL/uL (ref 3.87–5.11)
RDW: 12.5 % (ref 11.5–15.5)
WBC: 3.7 10*3/uL — ABNORMAL LOW (ref 4.0–10.5)
nRBC: 0 % (ref 0.0–0.2)

## 2018-12-11 LAB — TOTAL PROTEIN, URINE DIPSTICK: Protein, ur: 100 mg/dL — AB

## 2018-12-11 MED ORDER — ATENOLOL 25 MG PO TABS: 25.0000 mg | ORAL_TABLET | Freq: Every day | ORAL | 1 refills | Status: DC

## 2018-12-11 NOTE — Assessment & Plan Note (Signed)
She has not bee vigilant about BP check and dietary change I recommend additional of Atenolol daily and suggest we check her BP twice daily

## 2018-12-11 NOTE — Assessment & Plan Note (Signed)
Her pancytopenia is gradually improving with time away from treatment.  Observe only She is not symptomatic

## 2018-12-11 NOTE — Telephone Encounter (Signed)
No los °

## 2018-12-11 NOTE — Progress Notes (Signed)
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) Unfortunately, due to heavy proteinuria, I will hold treatment today We will focus on aggressive BP management She has no symptoms of recurrence Her recent tumor marker is stable I do not recommend rechecking CT for a while unless she is symptomatic with rising tumor marker  Essential hypertension She has not bee vigilant about BP check and dietary change I recommend additional of Atenolol daily and suggest we check her BP twice daily  Pancytopenia, acquired ([place]) Her pancytopenia is gradually improving with time away from treatment.  Observe only She is not symptomatic   No orders of the defined types were placed in this encounter.   INTERVAL HISTORY: Please see below for problem oriented charting. She returns for treatment today She denies headache of blurry vision No leg swelling. Her BP monitoring at home was not too bad No nausea, changes in bowel habits of bloating  SUMMARY OF ONCOLOGIC HISTORY: Oncology History   Neg genetics. Additional tests revealed ER: 80%, PR 3%      Right ovarian epithelial cancer ([place])   07/01/2015 Initial Diagnosis    She presented with postmenopausal bleeding    07/02/2015 Imaging    US pelvis showed bilateral ovarian cyst    07/13/2015 Imaging    5.2 cm left adnexal cystic lesion, with indeterminate but probably benign characteristics. In a postmenopausal female, consider continued annual imaging followup with CT or MRI versus surgical evaluation.  Colonic diverticulosis. No radiographic evidence of diverticulitis.  Cholelithiasis.  No radiographic evidence of cholecystitis.  Small hiatal hernia.    07/30/2015 Pathology Results    Outside pathology showed right ovary with high-grade serous carcinoma, 2 cm in maximum dimension.  The tumor involves  serosal surface of the right ovary and adjacent right fallopian tube.  Cervix and endometrium were within normal limits. ER positive Peritoneal washing was positive.    07/30/2015 Surgery    SHe underwent laparoscopic-assisted total vaginal hysterectomy, bilateral salpingo-oophorectomy    07/30/2015 Pathology Results    PERITONEAL WASHING PELVIC (SPECIMEN 1 OF 1, COLLECTED ON 07/30/2015): MALIGNANT CELLS CONSISTENT WITH HIGH GRADE CARCINOMA    08/13/2015 Tumor Marker    Patient's tumor was tested for the following markers: CA-125 Results of the tumor marker test revealed 96    08/25/2015 - 12/08/2015 Chemotherapy    The patient had 6 cycles of carboplatin and taxol    09/07/2015 Tumor Marker    Patient's tumor was tested for the following markers: CA-125 Results of the tumor marker test revealed 51    10/05/2015 Tumor Marker    Patient's tumor was tested for the following markers: CA-125 Results of the tumor marker test revealed 29    11/09/2015 Tumor Marker    Patient's tumor was tested for the following markers: CA-125 Results of the tumor marker test revealed 44    12/08/2015 Tumor Marker    Patient's tumor was tested for the following markers: CA-125 Results of the tumor marker test revealed 30    12/15/2015 Genetic Testing    Genetics testing normal by [facility] Breast Ovarian panel 12-15-15    01/11/2016 Imaging    1. The only finding of note are several adjacent peripheral abnormal hypodensities along the anterior superior spleen margin, differential diagnostic considerations including small peripheral interval splenic infarcts, splenic injury with subcapsular a fluid collections, or less likely metastatic  disease to the splenic margin. This likely merits observation. 2. Other imaging findings of potential clinical significance: Small type 1 hiatal hernia. Aortoiliac atherosclerotic vascular disease. Lower lumbar spondylosis and degenerative disc disease. Gallstones with potential mild  gallbladder wall thickening.    03/07/2016 Tumor Marker    Patient's tumor was tested for the following markers: CA-125 Results of the tumor marker test revealed 24.2    04/06/2016 Imaging    1. No evidence of a ovarian cancer metastasis. 2. No ascites. 3. Post hysterectomy and oophorectomy. 4. Cholelithiasis with several large gallstones    04/06/2016 Tumor Marker    Patient's tumor was tested for the following markers: CA-125 Results of the tumor marker test revealed 22.8    05/30/2016 Tumor Marker    Patient's tumor was tested for the following markers: CA-125 Results of the tumor marker test revealed 21    09/07/2016 Tumor Marker    Patient's tumor was tested for the following markers: CA-125 Results of the tumor marker test revealed 22.4    11/28/2016 Tumor Marker    Patient's tumor was tested for the following markers: CA-125 Results of the tumor marker test revealed 18.8    02/23/2017 Tumor Marker    Patient's tumor was tested for the following markers: CA-125 Results of the tumor marker test revealed 19.1    06/02/2017 Tumor Marker    Patient's tumor was tested for the following markers: CA-125 Results of the tumor marker test revealed 18.3    08/24/2017 Mammogram    Pt reports mammogram complete    08/28/2017 Tumor Marker    Patient's tumor was tested for the following markers: CA-125 Results of the tumor marker test revealed 21.1    12/01/2017 Tumor Marker    Patient's tumor was tested for the following markers: CA-125 Results of the tumor marker test revealed 31.2    12/13/2017 Imaging    New 2.7 cm peritoneal soft tissue mass in anterior left lower quadrant, consistent with metastatic disease.  No other sites of metastatic disease identified.  Incidental findings including: Cholelithiasis. Colonic diverticulosis. Tiny hiatal hernia. Aortic atherosclerosis.    01/09/2018 - 05/07/2018 Chemotherapy    The patient had carboplatin and taxol; After 2nd cycle, she  developed carboplatin allergy. She received carboplatin desensitization protocol at [facility] for final 4 cycles, completed by 6/17. Avastin was added from 04/16/18 onwards    01/30/2018 Adverse Reaction    Potential carboplatin allergy is suspected. She received half the dose of prescribed carboplatin on cycle 2    05/31/2018 Tumor Marker    Patient's tumor was tested for the following markers: CA-125 Results of the tumor marker test revealed 26    05/31/2018 Imaging    1. Peritoneal soft tissue nodule identified on the previous study has decreased in the interval the. No progressive findings in the abdomen or pelvis on today's study to suggest disease progression. 2. Tiny pulmonary nodules, likely benign. Attention on follow-up recommended. 3. Cholelithiasis. 4.  Aortic Atherosclerois (ICD10-170.0) 5. Tiny hiatal hernia.      06/04/2018 -  Chemotherapy    The patient is placed on Avastin for maintenance    06/25/2018 Tumor Marker    Patient's tumor was tested for the following markers: CA-125 Results of the tumor marker test revealed 22.5    08/06/2018 Tumor Marker    Patient's tumor was tested for the following markers: CA-125 Results of the tumor marker test revealed 20.7    09/14/2018 Imaging    Status post hysterectomy and  bilateral salpingo-oophorectomy.  Stable soft tissue nodule beneath the left lower anterior abdominal wall, likely reflecting stable peritoneal disease.  Scattered small subpleural nodules in the lungs bilaterally, measuring up to 3 mm, technically indeterminate although likely benign. Please note that Fleischner Society guidelines do not apply. Attention on follow-up is suggested.  No evidence of new/progressive metastatic disease.    09/14/2018 Tumor Marker    Patient's tumor was tested for the following markers: CA-125 Results of the tumor marker test revealed 21.3    10/30/2018 Tumor Marker    Patient's tumor was tested for the following markers:  CA-125 Results of the tumor marker test revealed 20.8     REVIEW OF SYSTEMS:   Constitutional: Denies fevers, chills or abnormal weight loss Eyes: Denies blurriness of vision Ears, nose, mouth, throat, and face: Denies mucositis or sore throat Respiratory: Denies cough, dyspnea or wheezes Cardiovascular: Denies palpitation, chest discomfort or lower extremity swelling Gastrointestinal:  Denies nausea, heartburn or change in bowel habits Skin: Denies abnormal skin rashes Lymphatics: Denies new lymphadenopathy or easy bruising Neurological:Denies numbness, tingling or new weaknesses Behavioral/Psych: Mood is stable, no new changes  All other systems were reviewed with the patient and are negative.  I have reviewed the past medical history, past surgical history, social history and family history with the patient and they are unchanged from previous note.  ALLERGIES:  is allergic to carboplatin.  MEDICATIONS:  Current Outpatient Medications  Medication Sig Dispense Refill  . amLODipine (NORVASC) 5 MG tablet Take 1 tablet (5 mg total) by mouth daily. 30 tablet 0  . atenolol (TENORMIN) 25 MG tablet Take 1 tablet (25 mg total) by mouth daily. 30 tablet 1  . atorvastatin (LIPITOR) 10 MG tablet Take 1 tablet (10 mg total) by mouth daily. 30 tablet 2  . Coenzyme Q10 (CO Q 10) 100 MG CAPS Take 1 capsule by mouth daily.     . Glucosamine-Chondroit-Vit C-Mn (GLUCOSAMINE 1500 COMPLEX PO) Take 1 capsule by mouth 2 (two) times daily.    . hydrochlorothiazide (HYDRODIURIL) 25 MG tablet Take 1 tablet (25 mg total) by mouth daily. 30 tablet 9  . lidocaine-prilocaine (EMLA) cream Apply to affected area once 30 g 3  . lisinopril (PRINIVIL,ZESTRIL) 30 MG tablet Take 1 tablet (30 mg total) by mouth daily. 30 tablet 11  . loratadine (CLARITIN) 10 MG tablet Take 10 mg by mouth daily as needed for allergies (hives).    . Multiple Vitamin (MULTIVITAMIN) capsule Take 1 capsule by mouth daily.     . Omega-3  Fatty Acids (OMEGA-3 FISH OIL) 300 MG CAPS Take 1 capsule by mouth daily.     . ondansetron (ZOFRAN) 8 MG tablet Take 1 tablet (8 mg total) by mouth 2 (two) times daily as needed for refractory nausea / vomiting. Start on day 3 after chemo. 30 tablet 1  . Polyethyl Glycol-Propyl Glycol (SYSTANE ULTRA OP) Apply 1 drop to eye 2 (two) times daily at 10 AM and 5 PM.    . Probiotic Product (PROBIOTIC ADVANCED PO) Take 1 capsule by mouth daily.    . prochlorperazine (COMPAZINE) 10 MG tablet Take 1 tablet (10 mg total) by mouth every 6 (six) hours as needed (Nausea or vomiting). 60 tablet 1  . vitamin E 400 UNIT capsule Take 400 Units by mouth every evening.      No current facility-administered medications for this visit.     PHYSICAL EXAMINATION: ECOG PERFORMANCE STATUS: 1 - Symptomatic but completely ambulatory  Vitals:  12/11/18 1040  BP: (!) 146/52  Pulse: 71  Resp: 18  Temp: 98.1 F (36.7 C)  SpO2: 100%   Filed Weights   12/11/18 1040  Weight: 142 lb 3.2 oz (64.5 kg)    GENERAL:alert, no distress and comfortable Musculoskeletal:no cyanosis of digits and no clubbing  NEURO: alert & oriented x 3 with fluent speech, no focal motor/sensory deficits  LABORATORY DATA:  I have reviewed the data as listed    Component Value Date/Time   NA 141 11/20/2018 1328   NA 142 11/28/2016 0916   K 4.0 11/20/2018 1328   K 3.9 11/28/2016 0916   CL 106 11/20/2018 1328   CO2 23 11/20/2018 1328   CO2 28 11/28/2016 0916   GLUCOSE 91 11/20/2018 1328   GLUCOSE 78 11/28/2016 0916   BUN 23 11/20/2018 1328   BUN 16.8 11/28/2016 0916   CREATININE 0.84 11/20/2018 1328   CREATININE 0.77 01/30/2018 0814   CREATININE 0.8 11/28/2016 0916   CALCIUM 10.0 11/20/2018 1328   CALCIUM 10.5 (H) 11/28/2016 0916   PROT 7.4 11/20/2018 1328   PROT 7.3 11/28/2016 0916   ALBUMIN 4.1 11/20/2018 1328   ALBUMIN 4.3 11/28/2016 0916   AST 24 11/20/2018 1328   AST 17 01/30/2018 0814   AST 19 11/28/2016 0916   ALT  17 11/20/2018 1328   ALT 18 01/30/2018 0814   ALT 19 11/28/2016 0916   ALKPHOS 52 11/20/2018 1328   ALKPHOS 50 11/28/2016 0916   BILITOT 0.5 11/20/2018 1328   BILITOT 0.5 01/30/2018 0814   BILITOT 0.48 11/28/2016 0916   GFRNONAA >60 11/20/2018 1328   GFRNONAA >60 01/30/2018 0814   GFRAA >60 11/20/2018 1328   GFRAA >60 01/30/2018 0814    No results found for: SPEP, UPEP  Lab Results  Component Value Date   WBC 3.7 (L) 12/11/2018   NEUTROABS 2.0 12/11/2018   HGB 12.3 12/11/2018   HCT 37.0 12/11/2018   MCV 93.4 12/11/2018   PLT 118 (L) 12/11/2018      Chemistry      Component Value Date/Time   NA 141 11/20/2018 1328   NA 142 11/28/2016 0916   K 4.0 11/20/2018 1328   K 3.9 11/28/2016 0916   CL 106 11/20/2018 1328   CO2 23 11/20/2018 1328   CO2 28 11/28/2016 0916   BUN 23 11/20/2018 1328   BUN 16.8 11/28/2016 0916   CREATININE 0.84 11/20/2018 1328   CREATININE 0.77 01/30/2018 0814   CREATININE 0.8 11/28/2016 0916      Component Value Date/Time   CALCIUM 10.0 11/20/2018 1328   CALCIUM 10.5 (H) 11/28/2016 0916   ALKPHOS 52 11/20/2018 1328   ALKPHOS 50 11/28/2016 0916   AST 24 11/20/2018 1328   AST 17 01/30/2018 0814   AST 19 11/28/2016 0916   ALT 17 11/20/2018 1328   ALT 18 01/30/2018 0814   ALT 19 11/28/2016 0916   BILITOT 0.5 11/20/2018 1328   BILITOT 0.5 01/30/2018 0814   BILITOT 0.48 11/28/2016 0916       All questions were answered. The patient knows to call the clinic with any problems, questions or concerns. No barriers to learning was detected.  I spent 15 minutes counseling the patient face to face. The total time spent in the appointment was 20 minutes and more than 50% was on counseling and review of test results  Heath Lark, MD 12/11/2018 3:59 PM

## 2018-12-11 NOTE — Assessment & Plan Note (Signed)
Unfortunately, due to heavy proteinuria, I will hold treatment today We will focus on aggressive BP management She has no symptoms of recurrence Her recent tumor marker is stable I do not recommend rechecking CT for a while unless she is symptomatic with rising tumor marker

## 2018-12-12 LAB — CA 125: Cancer Antigen (CA) 125: 19.7 U/mL (ref 0.0–38.1)

## 2018-12-17 ENCOUNTER — Encounter: Payer: Self-pay | Admitting: Hematology and Oncology

## 2018-12-18 ENCOUNTER — Telehealth: Payer: Self-pay

## 2018-12-18 NOTE — Telephone Encounter (Signed)
Called with below message.   12/17/18 BP-pm-134/65 and 5 minutes later BP- 128/67 12/18/18 am BP- 145/72 She is upset today about her son having surgery this morning.  She will send blood readings thru mychart in a couple of days.

## 2018-12-18 NOTE — Telephone Encounter (Signed)
-----   Message from Heath Lark, MD sent at 12/18/2018  8:06 AM EST ----- Regarding: BP control Can you call her about her BP?

## 2018-12-18 NOTE — Telephone Encounter (Signed)
OK, no change on BP for now

## 2018-12-21 ENCOUNTER — Encounter: Payer: Self-pay | Admitting: Hematology and Oncology

## 2018-12-21 ENCOUNTER — Ambulatory Visit: Payer: Medicare Other

## 2018-12-21 ENCOUNTER — Other Ambulatory Visit: Payer: Medicare Other

## 2018-12-24 ENCOUNTER — Encounter: Payer: Self-pay | Admitting: Hematology and Oncology

## 2018-12-24 ENCOUNTER — Other Ambulatory Visit: Payer: Self-pay | Admitting: Hematology and Oncology

## 2018-12-24 MED ORDER — AMLODIPINE BESYLATE 10 MG PO TABS: 10.0000 mg | ORAL_TABLET | Freq: Every day | ORAL | Status: DC

## 2018-12-31 ENCOUNTER — Encounter: Payer: Self-pay | Admitting: Hematology and Oncology

## 2019-01-01 ENCOUNTER — Inpatient Hospital Stay: Payer: Medicare Other

## 2019-01-01 ENCOUNTER — Encounter: Payer: Self-pay | Admitting: Hematology and Oncology

## 2019-01-01 ENCOUNTER — Inpatient Hospital Stay: Payer: Medicare Other | Attending: Gynecology

## 2019-01-01 ENCOUNTER — Telehealth: Payer: Self-pay | Admitting: Hematology and Oncology

## 2019-01-01 ENCOUNTER — Inpatient Hospital Stay: Payer: Medicare Other | Admitting: Hematology and Oncology

## 2019-01-01 VITALS — BP 121/52

## 2019-01-01 DIAGNOSIS — I10 Essential (primary) hypertension: Secondary | ICD-10-CM

## 2019-01-01 DIAGNOSIS — M7989 Other specified soft tissue disorders: Secondary | ICD-10-CM

## 2019-01-01 DIAGNOSIS — Z5112 Encounter for antineoplastic immunotherapy: Secondary | ICD-10-CM | POA: Diagnosis not present

## 2019-01-01 DIAGNOSIS — C561 Malignant neoplasm of right ovary: Secondary | ICD-10-CM | POA: Diagnosis not present

## 2019-01-01 DIAGNOSIS — D61818 Other pancytopenia: Secondary | ICD-10-CM | POA: Diagnosis not present

## 2019-01-01 DIAGNOSIS — Z7189 Other specified counseling: Secondary | ICD-10-CM

## 2019-01-01 LAB — COMPREHENSIVE METABOLIC PANEL
ALT: 15 U/L (ref 0–44)
AST: 22 U/L (ref 15–41)
Albumin: 4 g/dL (ref 3.5–5.0)
Alkaline Phosphatase: 46 U/L (ref 38–126)
Anion gap: 9 (ref 5–15)
BUN: 26 mg/dL — ABNORMAL HIGH (ref 8–23)
CO2: 26 mmol/L (ref 22–32)
Calcium: 9.9 mg/dL (ref 8.9–10.3)
Chloride: 106 mmol/L (ref 98–111)
Creatinine, Ser: 0.98 mg/dL (ref 0.44–1.00)
GFR calc Af Amer: >60 mL/min (ref >60)
GFR calc non Af Amer: 56 mL/min — ABNORMAL LOW (ref >60)
Glucose, Bld: 85 mg/dL (ref 70–99)
Potassium: 4.4 mmol/L (ref 3.5–5.1)
Sodium: 141 mmol/L (ref 135–145)
Total Bilirubin: 0.5 mg/dL (ref 0.3–1.2)
Total Protein: 7 g/dL (ref 6.5–8.1)

## 2019-01-01 LAB — CBC WITH DIFFERENTIAL/PLATELET
Abs Immature Granulocytes: 0.01 10*3/uL (ref 0.00–0.07)
BASOS PCT: 1 %
Basophils Absolute: 0 10*3/uL (ref 0.0–0.1)
Eosinophils Absolute: 0.2 10*3/uL (ref 0.0–0.5)
Eosinophils Relative: 5 %
HCT: 37.3 % (ref 36.0–46.0)
Hemoglobin: 12.2 g/dL (ref 12.0–15.0)
Immature Granulocytes: 0 %
Lymphocytes Relative: 31 %
Lymphs Abs: 1.2 10*3/uL (ref 0.7–4.0)
MCH: 30.7 pg (ref 26.0–34.0)
MCHC: 32.7 g/dL (ref 30.0–36.0)
MCV: 94 fL (ref 80.0–100.0)
Monocytes Absolute: 0.4 10*3/uL (ref 0.1–1.0)
Monocytes Relative: 11 %
Neutro Abs: 2 10*3/uL (ref 1.7–7.7)
Neutrophils Relative %: 52 %
PLATELETS: 120 10*3/uL — AB (ref 150–400)
RBC: 3.97 MIL/uL (ref 3.87–5.11)
RDW: 12.8 % (ref 11.5–15.5)
WBC: 3.8 10*3/uL — AB (ref 4.0–10.5)
nRBC: 0 % (ref 0.0–0.2)

## 2019-01-01 LAB — TOTAL PROTEIN, URINE DIPSTICK: Protein, ur: NEGATIVE mg/dL

## 2019-01-01 MED ORDER — SODIUM CHLORIDE 0.9 % IV SOLN
Freq: Once | INTRAVENOUS | Status: AC
  Administered 2019-01-01: 11:00:00 via INTRAVENOUS
  Filled 2019-01-01: qty 250

## 2019-01-01 MED ORDER — SODIUM CHLORIDE 0.9 % IV SOLN
15.0000 mg/kg | Freq: Once | INTRAVENOUS | Status: AC
  Administered 2019-01-01: 1000 mg via INTRAVENOUS
  Filled 2019-01-01: qty 32

## 2019-01-01 MED ORDER — SODIUM CHLORIDE 0.9% FLUSH
10.0000 mL | Freq: Once | INTRAVENOUS | Status: AC
  Administered 2019-01-01: 10 mL
  Filled 2019-01-01: qty 10

## 2019-01-01 MED ORDER — AMLODIPINE BESYLATE 10 MG PO TABS: 10.0000 mg | ORAL_TABLET | Freq: Every day | ORAL | 3 refills | Status: DC

## 2019-01-01 MED ORDER — SODIUM CHLORIDE 0.9% FLUSH
10.0000 mL | INTRAVENOUS | Status: DC | PRN
  Administered 2019-01-01: 10 mL
  Filled 2019-01-01: qty 10

## 2019-01-01 MED ORDER — HEPARIN SOD (PORK) LOCK FLUSH 100 UNIT/ML IV SOLN
500.0000 [IU] | Freq: Once | INTRAVENOUS | Status: AC | PRN
  Administered 2019-01-01: 500 [IU]
  Filled 2019-01-01: qty 5

## 2019-01-01 NOTE — Patient Instructions (Signed)
[facility] Discharge Instructions for Patients Receiving Chemotherapy  Today you received the following chemotherapy agents: Bevacizumab (Avastin)  To help prevent nausea and vomiting after your treatment, we encourage you to take your nausea medication as directed.    If you develop nausea and vomiting that is not controlled by your nausea medication, call the clinic.   BELOW ARE SYMPTOMS THAT SHOULD BE REPORTED IMMEDIATELY:  *FEVER GREATER THAN 100.5 F  *CHILLS WITH OR WITHOUT FEVER  NAUSEA AND VOMITING THAT IS NOT CONTROLLED WITH YOUR NAUSEA MEDICATION  *UNUSUAL SHORTNESS OF BREATH  *UNUSUAL BRUISING OR BLEEDING  TENDERNESS IN MOUTH AND THROAT WITH OR WITHOUT PRESENCE OF ULCERS  *URINARY PROBLEMS  *BOWEL PROBLEMS  UNUSUAL RASH Items with * indicate a potential emergency and should be followed up as soon as possible.  Feel free to call the clinic should you have any questions or concerns. The clinic phone number is [phone].  Please show the CHEMO ALERT CARD at check-in to the Emergency Department and triage nurse.   

## 2019-01-01 NOTE — Assessment & Plan Note (Signed)
Her pancytopenia is gradually improving with time away from treatment.  Observe only She is not symptomatic

## 2019-01-01 NOTE — Progress Notes (Signed)
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  Pancytopenia, acquired [facility]) Her pancytopenia is gradually improving with time away from treatment.  Observe only She is not symptomatic  Essential hypertension Her blood pressure control is excellent and she has resolution of proteinuria I reassured her that the mild intermittent leg swelling is not unusual especially while she is on amlodipine.  She will continue her blood pressure medications as prescribed   No orders of the defined types were placed in this encounter.   INTERVAL HISTORY: Please see below for problem oriented charting. She returns for bevacizumab treatment and follow-up She noted intermittent leg swelling She tolerated her blood pressure medications well. She denies abdominal bloating, changes in bowel habits or nausea.  SUMMARY OF ONCOLOGIC HISTORY: Oncology History   Neg genetics. Additional tests revealed ER: 80%, PR 3%      Right ovarian epithelial cancer ([place])   07/01/2015 Initial Diagnosis    She presented with postmenopausal bleeding    07/02/2015 Imaging    US pelvis showed bilateral ovarian cyst    07/13/2015 Imaging    5.2 cm left adnexal cystic lesion, with indeterminate but probably benign characteristics. In a postmenopausal female, consider continued annual imaging followup with CT or MRI versus surgical evaluation.  Colonic diverticulosis. No radiographic evidence of diverticulitis.  Cholelithiasis.  No radiographic evidence of cholecystitis.  Small hiatal hernia.    07/30/2015 Pathology Results    Outside pathology showed right ovary with  high-grade serous carcinoma, 2 cm in maximum dimension.  The tumor involves serosal surface of the right ovary and adjacent right fallopian tube.  Cervix and endometrium were within normal limits. ER positive Peritoneal washing was positive.    07/30/2015 Surgery    SHe underwent laparoscopic-assisted total vaginal hysterectomy, bilateral salpingo-oophorectomy    07/30/2015 Pathology Results    PERITONEAL WASHING PELVIC (SPECIMEN 1 OF 1, COLLECTED ON 07/30/2015): MALIGNANT CELLS CONSISTENT WITH HIGH GRADE CARCINOMA    08/13/2015 Tumor Marker    Patient's tumor was tested for the following markers: CA-125 Results of the tumor marker test revealed 96    08/25/2015 - 12/08/2015 Chemotherapy    The patient had 6 cycles of carboplatin and taxol    09/07/2015 Tumor Marker    Patient's tumor was tested for the following markers: CA-125 Results of the tumor marker test revealed 51    10/05/2015 Tumor Marker    Patient's tumor was tested for the following markers: CA-125 Results of the tumor marker test revealed 29    11/09/2015 Tumor Marker    Patient's tumor was tested for the following markers: CA-125 Results of the tumor marker test revealed 44    12/08/2015 Tumor Marker    Patient's tumor was tested for the following markers: CA-125 Results of the tumor marker test revealed 30    12/15/2015 Genetic Testing    Genetics testing normal by [facility] Breast Ovarian panel 12-15-15    01/11/2016 Imaging    1. The only finding of note are several adjacent peripheral abnormal hypodensities along the anterior superior spleen margin, differential diagnostic considerations including small peripheral interval splenic infarcts, splenic injury with subcapsular a fluid collections, or less likely  metastatic disease to the splenic margin. This likely merits observation. 2. Other imaging findings of potential clinical significance: Small type 1 hiatal hernia. Aortoiliac atherosclerotic vascular disease. Lower lumbar  spondylosis and degenerative disc disease. Gallstones with potential mild gallbladder wall thickening.    03/07/2016 Tumor Marker    Patient's tumor was tested for the following markers: CA-125 Results of the tumor marker test revealed 24.2    04/06/2016 Imaging    1. No evidence of a ovarian cancer metastasis. 2. No ascites. 3. Post hysterectomy and oophorectomy. 4. Cholelithiasis with several large gallstones    04/06/2016 Tumor Marker    Patient's tumor was tested for the following markers: CA-125 Results of the tumor marker test revealed 22.8    05/30/2016 Tumor Marker    Patient's tumor was tested for the following markers: CA-125 Results of the tumor marker test revealed 21    09/07/2016 Tumor Marker    Patient's tumor was tested for the following markers: CA-125 Results of the tumor marker test revealed 22.4    11/28/2016 Tumor Marker    Patient's tumor was tested for the following markers: CA-125 Results of the tumor marker test revealed 18.8    02/23/2017 Tumor Marker    Patient's tumor was tested for the following markers: CA-125 Results of the tumor marker test revealed 19.1    06/02/2017 Tumor Marker    Patient's tumor was tested for the following markers: CA-125 Results of the tumor marker test revealed 18.3    08/24/2017 Mammogram    Pt reports mammogram complete    08/28/2017 Tumor Marker    Patient's tumor was tested for the following markers: CA-125 Results of the tumor marker test revealed 21.1    12/01/2017 Tumor Marker    Patient's tumor was tested for the following markers: CA-125 Results of the tumor marker test revealed 31.2    12/13/2017 Imaging    New 2.7 cm peritoneal soft tissue mass in anterior left lower quadrant, consistent with metastatic disease.  No other sites of metastatic disease identified.  Incidental findings including: Cholelithiasis. Colonic diverticulosis. Tiny hiatal hernia. Aortic atherosclerosis.    01/09/2018 - 05/07/2018  Chemotherapy    The patient had carboplatin and taxol; After 2nd cycle, she developed carboplatin allergy. She received carboplatin desensitization protocol at [facility] for final 4 cycles, completed by 6/17. Avastin was added from 04/16/18 onwards    01/30/2018 Adverse Reaction    Potential carboplatin allergy is suspected. She received half the dose of prescribed carboplatin on cycle 2    05/31/2018 Tumor Marker    Patient's tumor was tested for the following markers: CA-125 Results of the tumor marker test revealed 26    05/31/2018 Imaging    1. Peritoneal soft tissue nodule identified on the previous study has decreased in the interval the. No progressive findings in the abdomen or pelvis on today's study to suggest disease progression. 2. Tiny pulmonary nodules, likely benign. Attention on follow-up recommended. 3. Cholelithiasis. 4.  Aortic Atherosclerois (ICD10-170.0) 5. Tiny hiatal hernia.      06/04/2018 -  Chemotherapy    The patient is placed on Avastin for maintenance    06/25/2018 Tumor Marker    Patient's tumor was tested for the following markers: CA-125 Results of the tumor marker test revealed 22.5    08/06/2018 Tumor Marker    Patient's tumor was tested for the following markers: CA-125 Results of the tumor marker test revealed 20.7    09/14/2018 Imaging    Status post hysterectomy  and bilateral salpingo-oophorectomy.  Stable soft tissue nodule beneath the left lower anterior abdominal wall, likely reflecting stable peritoneal disease.  Scattered small subpleural nodules in the lungs bilaterally, measuring up to 3 mm, technically indeterminate although likely benign. Please note that Fleischner Society guidelines do not apply. Attention on follow-up is suggested.  No evidence of new/progressive metastatic disease.    09/14/2018 Tumor Marker    Patient's tumor was tested for the following markers: CA-125 Results of the tumor marker test revealed 21.3    10/30/2018  Tumor Marker    Patient's tumor was tested for the following markers: CA-125 Results of the tumor marker test revealed 20.8    12/12/2018 Tumor Marker    Patient's tumor was tested for the following markers: CA-125 Results of the tumor marker test revealed 19.7     REVIEW OF SYSTEMS:   Constitutional: Denies fevers, chills or abnormal weight loss Eyes: Denies blurriness of vision Ears, nose, mouth, throat, and face: Denies mucositis or sore throat Respiratory: Denies cough, dyspnea or wheezes Cardiovascular: Denies palpitation, chest discomfort or lower extremity swelling Gastrointestinal:  Denies nausea, heartburn or change in bowel habits Skin: Denies abnormal skin rashes Lymphatics: Denies new lymphadenopathy or easy bruising Neurological:Denies numbness, tingling or new weaknesses Behavioral/Psych: Mood is stable, no new changes  All other systems were reviewed with the patient and are negative.  I have reviewed the past medical history, past surgical history, social history and family history with the patient and they are unchanged from previous note.  ALLERGIES:  is allergic to carboplatin.  MEDICATIONS:  Current Outpatient Medications  Medication Sig Dispense Refill  . amLODipine (NORVASC) 10 MG tablet Take 1 tablet (10 mg total) by mouth daily. 90 tablet 3  . atenolol (TENORMIN) 25 MG tablet Take 1 tablet (25 mg total) by mouth daily. 30 tablet 1  . atorvastatin (LIPITOR) 10 MG tablet Take 1 tablet (10 mg total) by mouth daily. 30 tablet 2  . Coenzyme Q10 (CO Q 10) 100 MG CAPS Take 1 capsule by mouth daily.     . Glucosamine-Chondroit-Vit C-Mn (GLUCOSAMINE 1500 COMPLEX PO) Take 1 capsule by mouth 2 (two) times daily.    . hydrochlorothiazide (HYDRODIURIL) 25 MG tablet Take 1 tablet (25 mg total) by mouth daily. 30 tablet 9  . lidocaine-prilocaine (EMLA) cream Apply to affected area once 30 g 3  . lisinopril (PRINIVIL,ZESTRIL) 30 MG tablet Take 1 tablet (30 mg total) by  mouth daily. 30 tablet 11  . loratadine (CLARITIN) 10 MG tablet Take 10 mg by mouth daily as needed for allergies (hives).    . Multiple Vitamin (MULTIVITAMIN) capsule Take 1 capsule by mouth daily.     . Omega-3 Fatty Acids (OMEGA-3 FISH OIL) 300 MG CAPS Take 1 capsule by mouth daily.     . ondansetron (ZOFRAN) 8 MG tablet Take 1 tablet (8 mg total) by mouth 2 (two) times daily as needed for refractory nausea / vomiting. Start on day 3 after chemo. 30 tablet 1  . Polyethyl Glycol-Propyl Glycol (SYSTANE ULTRA OP) Apply 1 drop to eye 2 (two) times daily at 10 AM and 5 PM.    . Probiotic Product (PROBIOTIC ADVANCED PO) Take 1 capsule by mouth daily.    . prochlorperazine (COMPAZINE) 10 MG tablet Take 1 tablet (10 mg total) by mouth every 6 (six) hours as needed (Nausea or vomiting). 60 tablet 1  . vitamin E 400 UNIT capsule Take 400 Units by mouth every evening.  No current facility-administered medications for this visit.     PHYSICAL EXAMINATION: ECOG PERFORMANCE STATUS: 1 - Symptomatic but completely ambulatory  Vitals:   01/01/19 1025  BP: (!) 134/45  Pulse: (!) 57  Resp: 18  Temp: 98.1 F (36.7 C)  SpO2: 100%   Filed Weights   01/01/19 1025  Weight: 139 lb 12.8 oz (63.4 kg)    GENERAL:alert, no distress and comfortable SKIN: skin color, texture, turgor are normal, no rashes or significant lesions EYES: normal, Conjunctiva are pink and non-injected, sclera clear OROPHARYNX:no exudate, no erythema and lips, buccal mucosa, and tongue normal  NECK: supple, thyroid normal size, non-tender, without nodularity LYMPH:  no palpable lymphadenopathy in the cervical, axillary or inguinal LUNGS: clear to auscultation and percussion with normal breathing effort HEART: regular rate & rhythm and no murmurs with mild bilateral lower extremity edema ABDOMEN:abdomen soft, non-tender and normal bowel sounds Musculoskeletal:no cyanosis of digits and no clubbing  NEURO: alert & oriented x 3  with fluent speech, no focal motor/sensory deficits  LABORATORY DATA:  I have reviewed the data as listed    Component Value Date/Time   NA 141 01/01/2019 0952   NA 142 11/28/2016 0916   K 4.4 01/01/2019 0952   K 3.9 11/28/2016 0916   CL 106 01/01/2019 0952   CO2 26 01/01/2019 0952   CO2 28 11/28/2016 0916   GLUCOSE 85 01/01/2019 0952   GLUCOSE 78 11/28/2016 0916   BUN 26 (H) 01/01/2019 0952   BUN 16.8 11/28/2016 0916   CREATININE 0.98 01/01/2019 0952   CREATININE 0.77 01/30/2018 0814   CREATININE 0.8 11/28/2016 0916   CALCIUM 9.9 01/01/2019 0952   CALCIUM 10.5 (H) 11/28/2016 0916   PROT 7.0 01/01/2019 0952   PROT 7.3 11/28/2016 0916   ALBUMIN 4.0 01/01/2019 0952   ALBUMIN 4.3 11/28/2016 0916   AST 22 01/01/2019 0952   AST 17 01/30/2018 0814   AST 19 11/28/2016 0916   ALT 15 01/01/2019 0952   ALT 18 01/30/2018 0814   ALT 19 11/28/2016 0916   ALKPHOS 46 01/01/2019 0952   ALKPHOS 50 11/28/2016 0916   BILITOT 0.5 01/01/2019 0952   BILITOT 0.5 01/30/2018 0814   BILITOT 0.48 11/28/2016 0916   GFRNONAA 56 (L) 01/01/2019 0952   GFRNONAA >60 01/30/2018 0814   GFRAA >60 01/01/2019 0952   GFRAA >60 01/30/2018 0814    No results found for: SPEP, UPEP  Lab Results  Component Value Date   WBC 3.8 (L) 01/01/2019   NEUTROABS 2.0 01/01/2019   HGB 12.2 01/01/2019   HCT 37.3 01/01/2019   MCV 94.0 01/01/2019   PLT 120 (L) 01/01/2019      Chemistry      Component Value Date/Time   NA 141 01/01/2019 0952   NA 142 11/28/2016 0916   K 4.4 01/01/2019 0952   K 3.9 11/28/2016 0916   CL 106 01/01/2019 0952   CO2 26 01/01/2019 0952   CO2 28 11/28/2016 0916   BUN 26 (H) 01/01/2019 0952   BUN 16.8 11/28/2016 0916   CREATININE 0.98 01/01/2019 0952   CREATININE 0.77 01/30/2018 0814   CREATININE 0.8 11/28/2016 0916      Component Value Date/Time   CALCIUM 9.9 01/01/2019 0952   CALCIUM 10.5 (H) 11/28/2016 0916   ALKPHOS 46 01/01/2019 0952   ALKPHOS 50 11/28/2016 0916   AST  22 01/01/2019 0952   AST 17 01/30/2018 0814   AST 19 11/28/2016 0916   ALT 15 01/01/2019 0952  ALT 18 01/30/2018 0814   ALT 19 11/28/2016 0916   BILITOT 0.5 01/01/2019 0952   BILITOT 0.5 01/30/2018 0814   BILITOT 0.48 11/28/2016 0916       All questions were answered. The patient knows to call the clinic with any problems, questions or concerns. No barriers to learning was detected.  I spent 15 minutes counseling the patient face to face. The total time spent in the appointment was 20 minutes and more than 50% was on counseling and review of test results  Heath Lark, MD 01/01/2019 3:49 PM

## 2019-01-01 NOTE — Assessment & Plan Note (Signed)
Her blood pressure control is excellent and she has resolution of proteinuria I reassured her that the mild intermittent leg swelling is not unusual especially while she is on amlodipine.  She will continue her blood pressure medications as prescribed

## 2019-01-01 NOTE — Telephone Encounter (Signed)
Gave avs and calendar ° °

## 2019-01-01 NOTE — Assessment & Plan Note (Signed)
She has no symptoms of recurrence Her recent tumor marker is stable I do not recommend rechecking CT for a while unless she is symptomatic with rising tumor marker She will continue bevacizumab indefinitely. 

## 2019-01-02 LAB — CA 125: Cancer Antigen (CA) 125: 21.6 U/mL (ref 0.0–38.1)

## 2019-01-21 ENCOUNTER — Encounter: Payer: Self-pay | Admitting: Hematology and Oncology

## 2019-01-22 ENCOUNTER — Inpatient Hospital Stay: Payer: Medicare Other

## 2019-01-22 ENCOUNTER — Inpatient Hospital Stay: Payer: Medicare Other | Attending: Gynecology

## 2019-01-22 VITALS — BP 145/72 | HR 61 | Temp 98.1°F | Resp 18

## 2019-01-22 DIAGNOSIS — Z5112 Encounter for antineoplastic immunotherapy: Secondary | ICD-10-CM | POA: Insufficient documentation

## 2019-01-22 DIAGNOSIS — C561 Malignant neoplasm of right ovary: Secondary | ICD-10-CM

## 2019-01-22 DIAGNOSIS — Z7189 Other specified counseling: Secondary | ICD-10-CM

## 2019-01-22 LAB — CBC WITH DIFFERENTIAL/PLATELET
Abs Immature Granulocytes: 0.01 10*3/uL (ref 0.00–0.07)
BASOS PCT: 1 %
Basophils Absolute: 0 10*3/uL (ref 0.0–0.1)
Eosinophils Absolute: 0.2 10*3/uL (ref 0.0–0.5)
Eosinophils Relative: 6 %
HCT: 36 % (ref 36.0–46.0)
Hemoglobin: 11.8 g/dL — ABNORMAL LOW (ref 12.0–15.0)
Immature Granulocytes: 0 %
Lymphocytes Relative: 28 %
Lymphs Abs: 1.2 10*3/uL (ref 0.7–4.0)
MCH: 30.5 pg (ref 26.0–34.0)
MCHC: 32.8 g/dL (ref 30.0–36.0)
MCV: 93 fL (ref 80.0–100.0)
Monocytes Absolute: 0.5 10*3/uL (ref 0.1–1.0)
Monocytes Relative: 12 %
Neutro Abs: 2.2 10*3/uL (ref 1.7–7.7)
Neutrophils Relative %: 53 %
PLATELETS: 103 10*3/uL — AB (ref 150–400)
RBC: 3.87 MIL/uL (ref 3.87–5.11)
RDW: 12.9 % (ref 11.5–15.5)
WBC: 4.2 10*3/uL (ref 4.0–10.5)
nRBC: 0 % (ref 0.0–0.2)

## 2019-01-22 LAB — COMPREHENSIVE METABOLIC PANEL
ALT: 14 U/L (ref 0–44)
AST: 21 U/L (ref 15–41)
Albumin: 3.7 g/dL (ref 3.5–5.0)
Alkaline Phosphatase: 42 U/L (ref 38–126)
Anion gap: 8 (ref 5–15)
BUN: 29 mg/dL — ABNORMAL HIGH (ref 8–23)
CO2: 24 mmol/L (ref 22–32)
CREATININE: 0.91 mg/dL (ref 0.44–1.00)
Calcium: 9.2 mg/dL (ref 8.9–10.3)
Chloride: 111 mmol/L (ref 98–111)
GFR calc Af Amer: >60 mL/min (ref >60)
Glucose, Bld: 99 mg/dL (ref 70–99)
Potassium: 3.9 mmol/L (ref 3.5–5.1)
Sodium: 143 mmol/L (ref 135–145)
Total Bilirubin: 0.4 mg/dL (ref 0.3–1.2)
Total Protein: 6.7 g/dL (ref 6.5–8.1)

## 2019-01-22 LAB — TOTAL PROTEIN, URINE DIPSTICK: Protein, ur: NEGATIVE mg/dL

## 2019-01-22 MED ORDER — SODIUM CHLORIDE 0.9% FLUSH
10.0000 mL | INTRAVENOUS | Status: DC | PRN
  Administered 2019-01-22: 10 mL
  Filled 2019-01-22: qty 10

## 2019-01-22 MED ORDER — HEPARIN SOD (PORK) LOCK FLUSH 100 UNIT/ML IV SOLN
500.0000 [IU] | Freq: Once | INTRAVENOUS | Status: AC | PRN
  Administered 2019-01-22: 500 [IU]
  Filled 2019-01-22: qty 5

## 2019-01-22 MED ORDER — SODIUM CHLORIDE 0.9 % IV SOLN
15.0000 mg/kg | Freq: Once | INTRAVENOUS | Status: AC
  Administered 2019-01-22: 1000 mg via INTRAVENOUS
  Filled 2019-01-22: qty 32

## 2019-01-22 MED ORDER — SODIUM CHLORIDE 0.9 % IV SOLN
Freq: Once | INTRAVENOUS | Status: AC
  Administered 2019-01-22: 09:00:00 via INTRAVENOUS
  Filled 2019-01-22: qty 250

## 2019-01-22 MED ORDER — SODIUM CHLORIDE 0.9% FLUSH
10.0000 mL | Freq: Once | INTRAVENOUS | Status: AC
  Administered 2019-01-22: 10 mL
  Filled 2019-01-22: qty 10

## 2019-01-22 NOTE — Patient Instructions (Signed)
[facility] Discharge Instructions for Patients Receiving Chemotherapy  Today you received the following chemotherapy agents: Bevacizumab (Avastin)  To help prevent nausea and vomiting after your treatment, we encourage you to take your nausea medication as directed.    If you develop nausea and vomiting that is not controlled by your nausea medication, call the clinic.   BELOW ARE SYMPTOMS THAT SHOULD BE REPORTED IMMEDIATELY:  *FEVER GREATER THAN 100.5 F  *CHILLS WITH OR WITHOUT FEVER  NAUSEA AND VOMITING THAT IS NOT CONTROLLED WITH YOUR NAUSEA MEDICATION  *UNUSUAL SHORTNESS OF BREATH  *UNUSUAL BRUISING OR BLEEDING  TENDERNESS IN MOUTH AND THROAT WITH OR WITHOUT PRESENCE OF ULCERS  *URINARY PROBLEMS  *BOWEL PROBLEMS  UNUSUAL RASH Items with * indicate a potential emergency and should be followed up as soon as possible.  Feel free to call the clinic should you have any questions or concerns. The clinic phone number is [phone].  Please show the CHEMO ALERT CARD at check-in to the Emergency Department and triage nurse.   

## 2019-01-23 ENCOUNTER — Telehealth: Payer: Self-pay

## 2019-01-23 LAB — CA 125: Cancer Antigen (CA) 125: 21.3 U/mL (ref 0.0–38.1)

## 2019-01-23 NOTE — Telephone Encounter (Signed)
-----   Message from Heath Lark, MD sent at 01/23/2019  9:12 AM EST ----- Regarding: CA-125 Pls let her know result is stable ----- Message ----- From: Interface, Lab In [place] Sent: 01/22/2019   8:24 AM EST To: Heath Lark, MD

## 2019-01-23 NOTE — Telephone Encounter (Signed)
Called and given below message. She verbalized understanding. 

## 2019-02-05 ENCOUNTER — Other Ambulatory Visit: Payer: Self-pay | Admitting: Hematology and Oncology

## 2019-02-05 DIAGNOSIS — C561 Malignant neoplasm of right ovary: Secondary | ICD-10-CM

## 2019-02-08 ENCOUNTER — Encounter: Payer: Self-pay | Admitting: Hematology and Oncology

## 2019-02-12 ENCOUNTER — Inpatient Hospital Stay: Payer: Medicare Other

## 2019-02-12 ENCOUNTER — Inpatient Hospital Stay: Payer: Medicare Other | Admitting: Hematology and Oncology

## 2019-02-12 ENCOUNTER — Telehealth: Payer: Self-pay | Admitting: Hematology and Oncology

## 2019-02-12 ENCOUNTER — Other Ambulatory Visit: Payer: Self-pay

## 2019-02-12 ENCOUNTER — Encounter: Payer: Self-pay | Admitting: Hematology and Oncology

## 2019-02-12 VITALS — BP 132/56 | HR 55

## 2019-02-12 DIAGNOSIS — D61818 Other pancytopenia: Secondary | ICD-10-CM | POA: Diagnosis not present

## 2019-02-12 DIAGNOSIS — T464X5A Adverse effect of angiotensin-converting-enzyme inhibitors, initial encounter: Secondary | ICD-10-CM

## 2019-02-12 DIAGNOSIS — I10 Essential (primary) hypertension: Secondary | ICD-10-CM | POA: Diagnosis not present

## 2019-02-12 DIAGNOSIS — M7989 Other specified soft tissue disorders: Secondary | ICD-10-CM

## 2019-02-12 DIAGNOSIS — C561 Malignant neoplasm of right ovary: Secondary | ICD-10-CM

## 2019-02-12 DIAGNOSIS — Z5112 Encounter for antineoplastic immunotherapy: Secondary | ICD-10-CM | POA: Diagnosis not present

## 2019-02-12 DIAGNOSIS — D696 Thrombocytopenia, unspecified: Secondary | ICD-10-CM | POA: Diagnosis not present

## 2019-02-12 DIAGNOSIS — R05 Cough: Secondary | ICD-10-CM

## 2019-02-12 DIAGNOSIS — Z7189 Other specified counseling: Secondary | ICD-10-CM

## 2019-02-12 LAB — COMPREHENSIVE METABOLIC PANEL
ALT: 17 U/L (ref 0–44)
AST: 23 U/L (ref 15–41)
Albumin: 4 g/dL (ref 3.5–5.0)
Alkaline Phosphatase: 51 U/L (ref 38–126)
Anion gap: 12 (ref 5–15)
BUN: 31 mg/dL — ABNORMAL HIGH (ref 8–23)
CO2: 23 mmol/L (ref 22–32)
Calcium: 9.5 mg/dL (ref 8.9–10.3)
Chloride: 107 mmol/L (ref 98–111)
Creatinine, Ser: 0.86 mg/dL (ref 0.44–1.00)
GFR calc Af Amer: >60 mL/min (ref >60)
GFR calc non Af Amer: >60 mL/min (ref >60)
GLUCOSE: 93 mg/dL (ref 70–99)
Potassium: 4.2 mmol/L (ref 3.5–5.1)
SODIUM: 142 mmol/L (ref 135–145)
Total Bilirubin: 0.4 mg/dL (ref 0.3–1.2)
Total Protein: 7.5 g/dL (ref 6.5–8.1)

## 2019-02-12 LAB — CBC WITH DIFFERENTIAL/PLATELET
ABS IMMATURE GRANULOCYTES: 0 10*3/uL (ref 0.00–0.07)
Basophils Absolute: 0.1 10*3/uL (ref 0.0–0.1)
Basophils Relative: 1 %
Eosinophils Absolute: 0.2 10*3/uL (ref 0.0–0.5)
Eosinophils Relative: 5 %
HCT: 38.9 % (ref 36.0–46.0)
Hemoglobin: 12.6 g/dL (ref 12.0–15.0)
Immature Granulocytes: 0 %
Lymphocytes Relative: 33 %
Lymphs Abs: 1.5 10*3/uL (ref 0.7–4.0)
MCH: 30.2 pg (ref 26.0–34.0)
MCHC: 32.4 g/dL (ref 30.0–36.0)
MCV: 93.3 fL (ref 80.0–100.0)
Monocytes Absolute: 0.5 10*3/uL (ref 0.1–1.0)
Monocytes Relative: 10 %
NEUTROS ABS: 2.3 10*3/uL (ref 1.7–7.7)
NEUTROS PCT: 51 %
Platelets: 121 10*3/uL — ABNORMAL LOW (ref 150–400)
RBC: 4.17 MIL/uL (ref 3.87–5.11)
RDW: 13.2 % (ref 11.5–15.5)
WBC: 4.6 10*3/uL (ref 4.0–10.5)
nRBC: 0 % (ref 0.0–0.2)

## 2019-02-12 LAB — TOTAL PROTEIN, URINE DIPSTICK: Protein, ur: NEGATIVE mg/dL

## 2019-02-12 MED ORDER — HEPARIN SOD (PORK) LOCK FLUSH 100 UNIT/ML IV SOLN
500.0000 [IU] | Freq: Once | INTRAVENOUS | Status: AC | PRN
  Administered 2019-02-12: 500 [IU]
  Filled 2019-02-12: qty 5

## 2019-02-12 MED ORDER — SODIUM CHLORIDE 0.9 % IV SOLN
Freq: Once | INTRAVENOUS | Status: AC
  Administered 2019-02-12: 13:00:00 via INTRAVENOUS
  Filled 2019-02-12: qty 250

## 2019-02-12 MED ORDER — SODIUM CHLORIDE 0.9% FLUSH
10.0000 mL | Freq: Once | INTRAVENOUS | Status: AC
  Administered 2019-02-12: 10 mL
  Filled 2019-02-12: qty 10

## 2019-02-12 MED ORDER — SODIUM CHLORIDE 0.9 % IV SOLN
15.0000 mg/kg | Freq: Once | INTRAVENOUS | Status: AC
  Administered 2019-02-12: 1000 mg via INTRAVENOUS
  Filled 2019-02-12: qty 32

## 2019-02-12 MED ORDER — SODIUM CHLORIDE 0.9% FLUSH
10.0000 mL | INTRAVENOUS | Status: DC | PRN
  Administered 2019-02-12: 10 mL
  Filled 2019-02-12: qty 10

## 2019-02-12 NOTE — Progress Notes (Signed)
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  Essential hypertension Her blood pressure control is excellent and she has resolution of proteinuria I reassured her that the mild intermittent leg swelling is not unusual especially while she is on amlodipine.   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  Observe only She has mild chronic thrombocytopenia but not symptomatic   No orders of the defined types were placed in this encounter.   INTERVAL HISTORY: Please see below for problem oriented charting. She returns for further follow-up Her blood pressure at home has been mildly trending up but she is not symptomatic She has rare occasional leg swelling She has chronic cough secondary to lisinopril and that is not changed She denies nasal drainage, sore throat or fever Denies abdominal pain, bloating or vaginal bleeding  SUMMARY OF ONCOLOGIC HISTORY: Oncology History   Neg genetics. Additional tests revealed ER: 80%, PR 3%      Right ovarian epithelial cancer ([place])   07/01/2015 Initial Diagnosis    She presented with postmenopausal bleeding    07/02/2015 Imaging    US pelvis showed bilateral ovarian cyst    07/13/2015 Imaging    5.2 cm left adnexal cystic lesion, with indeterminate but probably benign characteristics. In a postmenopausal female, consider continued annual imaging  followup with CT or MRI versus surgical evaluation.  Colonic diverticulosis. No radiographic evidence of diverticulitis.  Cholelithiasis.  No radiographic evidence of cholecystitis.  Small hiatal hernia.    07/30/2015 Pathology Results    Outside pathology showed right ovary with high-grade serous carcinoma, 2 cm in maximum dimension.  The tumor involves serosal surface of the right ovary and adjacent right fallopian tube.  Cervix and endometrium were within normal limits. ER positive Peritoneal washing was positive.    07/30/2015 Surgery    SHe underwent laparoscopic-assisted total vaginal hysterectomy, bilateral salpingo-oophorectomy    07/30/2015 Pathology Results    PERITONEAL WASHING PELVIC (SPECIMEN 1 OF 1, COLLECTED ON 07/30/2015): MALIGNANT CELLS CONSISTENT WITH HIGH GRADE CARCINOMA    08/13/2015 Tumor Marker    Patient's tumor was tested for the following markers: CA-125 Results of the tumor marker test revealed 96    08/25/2015 - 12/08/2015 Chemotherapy    The patient had 6 cycles of carboplatin and taxol    09/07/2015 Tumor Marker    Patient's tumor was tested for the following markers: CA-125 Results of the tumor marker test revealed 51    10/05/2015 Tumor Marker    Patient's tumor was tested for the following markers: CA-125 Results of the tumor marker test revealed 29    11/09/2015 Tumor Marker    Patient's tumor was tested for the following markers: CA-125 Results of the tumor marker test revealed 44    12/08/2015 Tumor Marker    Patient's tumor was tested for the following markers: CA-125 Results of the tumor marker test revealed 30    12/15/2015 Genetic Testing    Genetics testing  normal by [facility] Breast Ovarian panel 12-15-15    01/11/2016 Imaging    1. The only finding of note are several adjacent peripheral abnormal hypodensities along the anterior superior spleen margin, differential diagnostic considerations including small peripheral interval splenic infarcts,  splenic injury with subcapsular a fluid collections, or less likely metastatic disease to the splenic margin. This likely merits observation. 2. Other imaging findings of potential clinical significance: Small type 1 hiatal hernia. Aortoiliac atherosclerotic vascular disease. Lower lumbar spondylosis and degenerative disc disease. Gallstones with potential mild gallbladder wall thickening.    03/07/2016 Tumor Marker    Patient's tumor was tested for the following markers: CA-125 Results of the tumor marker test revealed 24.2    04/06/2016 Imaging    1. No evidence of a ovarian cancer metastasis. 2. No ascites. 3. Post hysterectomy and oophorectomy. 4. Cholelithiasis with several large gallstones    04/06/2016 Tumor Marker    Patient's tumor was tested for the following markers: CA-125 Results of the tumor marker test revealed 22.8    05/30/2016 Tumor Marker    Patient's tumor was tested for the following markers: CA-125 Results of the tumor marker test revealed 21    09/07/2016 Tumor Marker    Patient's tumor was tested for the following markers: CA-125 Results of the tumor marker test revealed 22.4    11/28/2016 Tumor Marker    Patient's tumor was tested for the following markers: CA-125 Results of the tumor marker test revealed 18.8    02/23/2017 Tumor Marker    Patient's tumor was tested for the following markers: CA-125 Results of the tumor marker test revealed 19.1    06/02/2017 Tumor Marker    Patient's tumor was tested for the following markers: CA-125 Results of the tumor marker test revealed 18.3    08/24/2017 Mammogram    Pt reports mammogram complete    08/28/2017 Tumor Marker    Patient's tumor was tested for the following markers: CA-125 Results of the tumor marker test revealed 21.1    12/01/2017 Tumor Marker    Patient's tumor was tested for the following markers: CA-125 Results of the tumor marker test revealed 31.2    12/13/2017 Imaging    New 2.7 cm peritoneal  soft tissue mass in anterior left lower quadrant, consistent with metastatic disease.  No other sites of metastatic disease identified.  Incidental findings including: Cholelithiasis. Colonic diverticulosis. Tiny hiatal hernia. Aortic atherosclerosis.    01/09/2018 - 05/07/2018 Chemotherapy    The patient had carboplatin and taxol; After 2nd cycle, she developed carboplatin allergy. She received carboplatin desensitization protocol at [facility] for final 4 cycles, completed by 6/17. Avastin was added from 04/16/18 onwards    01/30/2018 Adverse Reaction    Potential carboplatin allergy is suspected. She received half the dose of prescribed carboplatin on cycle 2    05/31/2018 Tumor Marker    Patient's tumor was tested for the following markers: CA-125 Results of the tumor marker test revealed 26    05/31/2018 Imaging    1. Peritoneal soft tissue nodule identified on the previous study has decreased in the interval the. No progressive findings in the abdomen or pelvis on today's study to suggest disease progression. 2. Tiny pulmonary nodules, likely benign. Attention on follow-up recommended. 3. Cholelithiasis. 4.  Aortic Atherosclerois (ICD10-170.0) 5. Tiny hiatal hernia.      06/04/2018 -  Chemotherapy    The patient is placed on Avastin for maintenance    06/25/2018 Tumor Marker    Patient's tumor  was tested for the following markers: CA-125 Results of the tumor marker test revealed 22.5    08/06/2018 Tumor Marker    Patient's tumor was tested for the following markers: CA-125 Results of the tumor marker test revealed 20.7    09/14/2018 Imaging    Status post hysterectomy and bilateral salpingo-oophorectomy.  Stable soft tissue nodule beneath the left lower anterior abdominal wall, likely reflecting stable peritoneal disease.  Scattered small subpleural nodules in the lungs bilaterally, measuring up to 3 mm, technically indeterminate although likely benign. Please note that Fleischner  Society guidelines do not apply. Attention on follow-up is suggested.  No evidence of new/progressive metastatic disease.    09/14/2018 Tumor Marker    Patient's tumor was tested for the following markers: CA-125 Results of the tumor marker test revealed 21.3    10/30/2018 Tumor Marker    Patient's tumor was tested for the following markers: CA-125 Results of the tumor marker test revealed 20.8    12/12/2018 Tumor Marker    Patient's tumor was tested for the following markers: CA-125 Results of the tumor marker test revealed 19.7    01/01/2019 Tumor Marker    Patient's tumor was tested for the following markers: CA-125 Results of the tumor marker test revealed 21.6    01/22/2019 Tumor Marker    Patient's tumor was tested for the following markers: CA-125 Results of the tumor marker test revealed 21.3     REVIEW OF SYSTEMS:   Constitutional: Denies fevers, chills or abnormal weight loss Eyes: Denies blurriness of vision Ears, nose, mouth, throat, and face: Denies mucositis or sore throat Cardiovascular: Denies palpitation, chest discomfort or lower extremity swelling Gastrointestinal:  Denies nausea, heartburn or change in bowel habits Skin: Denies abnormal skin rashes Lymphatics: Denies new lymphadenopathy or easy bruising Neurological:Denies numbness, tingling or new weaknesses Behavioral/Psych: Mood is stable, no new changes  All other systems were reviewed with the patient and are negative.  I have reviewed the past medical history, past surgical history, social history and family history with the patient and they are unchanged from previous note.  ALLERGIES:  is allergic to carboplatin.  MEDICATIONS:  Current Outpatient Medications  Medication Sig Dispense Refill  . vitamin C (ASCORBIC ACID) 500 MG tablet Take 500 mg by mouth 2 (two) times daily.    [name] amLODipine (NORVASC) 10 MG tablet Take 1 tablet (10 mg total) by mouth daily. 90 tablet 3  . atenolol (TENORMIN) 25 MG  tablet Take 1 tablet (25 mg total) by mouth daily. 30 tablet 1  . atorvastatin (LIPITOR) 10 MG tablet Take 1 tablet (10 mg total) by mouth daily. 30 tablet 2  . Coenzyme Q10 (CO Q 10) 100 MG CAPS Take 1 capsule by mouth daily.     . Glucosamine-Chondroit-Vit C-Mn (GLUCOSAMINE 1500 COMPLEX PO) Take 1 capsule by mouth 2 (two) times daily.    . hydrochlorothiazide (HYDRODIURIL) 25 MG tablet Take 1 tablet (25 mg total) by mouth daily. 30 tablet 9  . lidocaine-prilocaine (EMLA) cream Apply to affected area once 30 g 3  . lisinopril (PRINIVIL,ZESTRIL) 30 MG tablet Take 1 tablet (30 mg total) by mouth daily. 30 tablet 11  . loratadine (CLARITIN) 10 MG tablet Take 10 mg by mouth daily as needed for allergies (hives).    . Multiple Vitamin (MULTIVITAMIN) capsule Take 1 capsule by mouth daily.     . Omega-3 Fatty Acids (OMEGA-3 FISH OIL) 300 MG CAPS Take 1 capsule by mouth daily.     [name]  ondansetron (ZOFRAN) 8 MG tablet Take 1 tablet (8 mg total) by mouth 2 (two) times daily as needed for refractory nausea / vomiting. Start on day 3 after chemo. 30 tablet 1  . Polyethyl Glycol-Propyl Glycol (SYSTANE ULTRA OP) Apply 1 drop to eye 2 (two) times daily at 10 AM and 5 PM.    . Probiotic Product (PROBIOTIC ADVANCED PO) Take 1 capsule by mouth daily.    . prochlorperazine (COMPAZINE) 10 MG tablet Take 1 tablet (10 mg total) by mouth every 6 (six) hours as needed (Nausea or vomiting). 60 tablet 1  . vitamin E 400 UNIT capsule Take 400 Units by mouth every evening.      No current facility-administered medications for this visit.     PHYSICAL EXAMINATION: ECOG PERFORMANCE STATUS: 0 - Asymptomatic  Vitals:   02/12/19 1239  BP: (!) 147/54  Pulse: 60  Resp: 18  Temp: 98.3 F (36.8 C)  SpO2: 100%   Filed Weights   02/12/19 1239  Weight: 139 lb 3.2 oz (63.1 kg)    GENERAL:alert, no distress and comfortable SKIN: skin color, texture, turgor are normal, no rashes or significant lesions EYES: normal,  Conjunctiva are pink and non-injected, sclera clear OROPHARYNX:no exudate, no erythema and lips, buccal mucosa, and tongue normal  NECK: supple, thyroid normal size, non-tender, without nodularity LYMPH:  no palpable lymphadenopathy in the cervical, axillary or inguinal LUNGS: clear to auscultation and percussion with normal breathing effort HEART: regular rate & rhythm and no murmurs and no lower extremity edema ABDOMEN:abdomen soft, non-tender and normal bowel sounds Musculoskeletal:no cyanosis of digits and no clubbing  NEURO: alert & oriented x 3 with fluent speech, no focal motor/sensory deficits  LABORATORY DATA:  I have reviewed the data as listed    Component Value Date/Time   NA 142 02/12/2019 1153   NA 142 11/28/2016 0916   K 4.2 02/12/2019 1153   K 3.9 11/28/2016 0916   CL 107 02/12/2019 1153   CO2 23 02/12/2019 1153   CO2 28 11/28/2016 0916   GLUCOSE 93 02/12/2019 1153   GLUCOSE 78 11/28/2016 0916   BUN 31 (H) 02/12/2019 1153   BUN 16.8 11/28/2016 0916   CREATININE 0.86 02/12/2019 1153   CREATININE 0.77 01/30/2018 0814   CREATININE 0.8 11/28/2016 0916   CALCIUM 9.5 02/12/2019 1153   CALCIUM 10.5 (H) 11/28/2016 0916   PROT 7.5 02/12/2019 1153   PROT 7.3 11/28/2016 0916   ALBUMIN 4.0 02/12/2019 1153   ALBUMIN 4.3 11/28/2016 0916   AST 23 02/12/2019 1153   AST 17 01/30/2018 0814   AST 19 11/28/2016 0916   ALT 17 02/12/2019 1153   ALT 18 01/30/2018 0814   ALT 19 11/28/2016 0916   ALKPHOS 51 02/12/2019 1153   ALKPHOS 50 11/28/2016 0916   BILITOT 0.4 02/12/2019 1153   BILITOT 0.5 01/30/2018 0814   BILITOT 0.48 11/28/2016 0916   GFRNONAA >60 02/12/2019 1153   GFRNONAA >60 01/30/2018 0814   GFRAA >60 02/12/2019 1153   GFRAA >60 01/30/2018 0814    No results found for: SPEP, UPEP  Lab Results  Component Value Date   WBC 4.6 02/12/2019   NEUTROABS 2.3 02/12/2019   HGB 12.6 02/12/2019   HCT 38.9 02/12/2019   MCV 93.3 02/12/2019   PLT 121 (L) 02/12/2019       Chemistry      Component Value Date/Time   NA 142 02/12/2019 1153   NA 142 11/28/2016 0916   K 4.2 02/12/2019 1153  K 3.9 11/28/2016 0916   CL 107 02/12/2019 1153   CO2 23 02/12/2019 1153   CO2 28 11/28/2016 0916   BUN 31 (H) 02/12/2019 1153   BUN 16.8 11/28/2016 0916   CREATININE 0.86 02/12/2019 1153   CREATININE 0.77 01/30/2018 0814   CREATININE 0.8 11/28/2016 0916      Component Value Date/Time   CALCIUM 9.5 02/12/2019 1153   CALCIUM 10.5 (H) 11/28/2016 0916   ALKPHOS 51 02/12/2019 1153   ALKPHOS 50 11/28/2016 0916   AST 23 02/12/2019 1153   AST 17 01/30/2018 0814   AST 19 11/28/2016 0916   ALT 17 02/12/2019 1153   ALT 18 01/30/2018 0814   ALT 19 11/28/2016 0916   BILITOT 0.4 02/12/2019 1153   BILITOT 0.5 01/30/2018 0814   BILITOT 0.48 11/28/2016 0916     All questions were answered. The patient knows to call the clinic with any problems, questions or concerns. No barriers to learning was detected.  I spent 15 minutes counseling the patient face to face. The total time spent in the appointment was 20 minutes and more than 50% was on counseling and review of test results  Heath Lark, MD 02/12/2019 1:05 PM

## 2019-02-12 NOTE — Patient Instructions (Addendum)
Coronavirus (COVID-19) Are you at risk?  Are you at risk for the Coronavirus (COVID-19)?  To be considered HIGH RISK for Coronavirus (COVID-19), you have to meet the following criteria:  . Traveled to [place], [place], [name], [place] or [place]; or in the [place] to [place], [place], [place], or [place]; and have fever, cough, and shortness of breath within the last 2 weeks of travel OR . Been in close contact with a person diagnosed with COVID-19 within the last 2 weeks and have fever, cough, and shortness of breath . IF YOU DO NOT MEET THESE CRITERIA, YOU ARE CONSIDERED LOW RISK FOR COVID-19.  What to do if you are HIGH RISK for COVID-19?  . If you are having a medical emergency, call 911. . Seek medical care right away. Before you go to a doctor's office, urgent care or emergency department, call ahead and tell them about your recent travel, contact with someone diagnosed with COVID-19, and your symptoms. You should receive instructions from your physician's office regarding next steps of care.  . When you arrive at healthcare provider, tell the healthcare staff immediately you have returned from visiting [place], [place], [place], [place] or [name]; or traveled in the [place] to [place], [place], [place], or [place]; in the last two weeks or you have been in close contact with a person diagnosed with COVID-19 in the last 2 weeks.   . Tell the health care staff about your symptoms: fever, cough and shortness of breath. . After you have been seen by a medical provider, you will be either: o Tested for (COVID-19) and discharged home on quarantine except to seek medical care if symptoms worsen, and asked to  - Stay home and avoid contact with others until you get your results (4-5 days)  - Avoid travel on public transportation if possible (such as bus, train, or airplane) or o Sent to the Emergency Department by EMS for evaluation, COVID-19 testing, and possible  admission depending on your condition and test results.  What to do if you are LOW RISK for COVID-19?  Reduce your risk of any infection by using the same precautions used for avoiding the common cold or flu:  . Wash your hands often with soap and warm water for at least 20 seconds.  If soap and water are not readily available, use an alcohol-based hand sanitizer with at least 60% alcohol.  . If coughing or sneezing, cover your mouth and nose by coughing or sneezing into the elbow areas of your shirt or coat, into a tissue or into your sleeve (not your hands). . Avoid shaking hands with others and consider head nods or verbal greetings only. . Avoid touching your eyes, nose, or mouth with unwashed hands.  . Avoid close contact with people who are sick. . Avoid places or events with large numbers of people in one location, like concerts or sporting events. . Carefully consider travel plans you have or are making. . If you are planning any travel outside or inside the [place], visit the CDC's Travelers' Health webpage for the latest health notices. . If you have some symptoms but not all symptoms, continue to monitor at home and seek medical attention if your symptoms worsen. . If you are having a medical emergency, call 911.  ADDITIONAL HEALTHCARE OPTIONS FOR PATIENTS  [facility] / e-Visit: [email or web address]         [facility]: [phone]  [facility]: [facility]: [facility] Discharge Instructions for Patients Receiving Chemotherapy  Today you received the following chemotherapy agents: Bevacizumab (Avastin)  To help prevent nausea and vomiting after your treatment, we encourage you to take your nausea medication as directed.    If you develop nausea and vomiting that is not controlled by your nausea medication, call the clinic.    BELOW ARE SYMPTOMS THAT SHOULD BE REPORTED IMMEDIATELY:  *FEVER GREATER THAN 100.5 F  *CHILLS WITH OR WITHOUT FEVER  NAUSEA AND VOMITING THAT IS NOT CONTROLLED WITH YOUR NAUSEA MEDICATION  *UNUSUAL SHORTNESS OF BREATH  *UNUSUAL BRUISING OR BLEEDING  TENDERNESS IN MOUTH AND THROAT WITH OR WITHOUT PRESENCE OF ULCERS  *URINARY PROBLEMS  *BOWEL PROBLEMS  UNUSUAL RASH Items with * indicate a potential emergency and should be followed up as soon as possible.  Feel free to call the clinic should you have any questions or concerns. The clinic phone number is [phone].  Please show the [facility] at check-in to the Emergency Department and triage nurse.  Bevacizumab injection What is this medicine? BEVACIZUMAB (be va SIZ yoo mab) is a monoclonal antibody. It is used to treat many types of cancer. This medicine may be used for other purposes; ask your health care provider or pharmacist if you have questions. COMMON BRAND NAME(S): Avastin, MVASI What should I tell my health care provider before I take this medicine? They need to know if you have any of these conditions: -diabetes -heart disease -high blood pressure -history of coughing up blood -prior anthracycline chemotherapy (e.g., doxorubicin, daunorubicin, epirubicin) -recent or ongoing radiation therapy -recent or planning to have surgery -stroke -an unusual or allergic reaction to bevacizumab, hamster proteins, mouse proteins, other medicines, foods, dyes, or preservatives -pregnant or trying to get pregnant -breast-feeding How should I use this medicine? This medicine is for infusion into a vein. It is given by a health care professional in a hospital or clinic setting. Talk to your pediatrician regarding the use of this medicine in children. Special care may be needed. Overdosage: If you think you have taken too much of this medicine contact a poison control center or emergency room at once. NOTE: This  medicine is only for you. Do not share this medicine with others. What if I miss a dose? It is important not to miss your dose. Call your doctor or health care professional if you are unable to keep an appointment. What may interact with this medicine? Interactions are not expected. This list may not describe all possible interactions. Give your health care provider a list of all the medicines, herbs, non-prescription drugs, or dietary supplements you use. Also tell them if you smoke, drink alcohol, or use illegal drugs. Some items may interact with your medicine. What should I watch for while using this medicine? Your condition will be monitored carefully while you are receiving this medicine. You will need important blood work and urine testing done while you are taking this medicine. This medicine may increase your risk to bruise or bleed. Call your doctor or health care professional if you notice any unusual bleeding. This medicine should be started at least 28 days following major surgery and the site of the surgery should be totally healed. Check with your doctor before scheduling dental work or surgery while you are receiving this treatment. Talk  to your doctor if you have recently had surgery or if you have a wound that has not healed. Do not become pregnant while taking this medicine or for 6 months after stopping it. Women should inform their doctor if they wish to become pregnant or think they might be pregnant. There is a potential for serious side effects to an unborn child. Talk to your health care professional or pharmacist for more information. Do not breast-feed an infant while taking this medicine and for 6 months after the last dose. This medicine has caused ovarian failure in some women. This medicine may interfere with the ability to have a child. You should talk to your doctor or health care professional if you are concerned about your fertility. What side effects may I notice from  receiving this medicine? Side effects that you should report to your doctor or health care professional as soon as possible: -allergic reactions like skin rash, itching or hives, swelling of the face, lips, or tongue -chest pain or chest tightness -chills -coughing up blood -high fever -seizures -severe constipation -signs and symptoms of bleeding such as bloody or black, tarry stools; red or dark-brown urine; spitting up blood or brown material that looks like coffee grounds; red spots on the skin; unusual bruising or bleeding from the eye, gums, or nose -signs and symptoms of a blood clot such as breathing problems; chest pain; severe, sudden headache; pain, swelling, warmth in the leg -signs and symptoms of a stroke like changes in vision; confusion; trouble speaking or understanding; severe headaches; sudden numbness or weakness of the face, arm or leg; trouble walking; dizziness; loss of balance or coordination -stomach pain -sweating -swelling of legs or ankles -vomiting -weight gain Side effects that usually do not require medical attention (report to your doctor or health care professional if they continue or are bothersome): -back pain -changes in taste -decreased appetite -dry skin -nausea -tiredness This list may not describe all possible side effects. Call your doctor for medical advice about side effects. You may report side effects to FDA at 1-800-FDA-1088. Where should I keep my medicine? This drug is given in a hospital or clinic and will not be stored at home. NOTE: This sheet is a summary. It may not cover all possible information. If you have questions about this medicine, talk to your doctor, pharmacist, or health care provider.  2019 Elsevier/Gold Standard (2016-11-04 14:33:29)

## 2019-02-12 NOTE — Assessment & Plan Note (Signed)
Her pancytopenia is gradually improving with time away from treatment.  Observe only She has mild chronic thrombocytopenia but not symptomatic 

## 2019-02-12 NOTE — Assessment & Plan Note (Signed)
She has no symptoms of recurrence Her recent tumor marker is stable I do not recommend rechecking CT for a while unless she is symptomatic with rising tumor marker She will continue bevacizumab indefinitely. 

## 2019-02-12 NOTE — Assessment & Plan Note (Signed)
Her blood pressure control is excellent and she has resolution of proteinuria I reassured her that the mild intermittent leg swelling is not unusual especially while she is on amlodipine.   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

## 2019-02-12 NOTE — Telephone Encounter (Signed)
Scheduled appt per 3/24 los. ° °Printed calendar and avs. °

## 2019-02-13 ENCOUNTER — Telehealth: Payer: Self-pay

## 2019-02-13 LAB — CA 125: Cancer Antigen (CA) 125: 21.6 U/mL (ref 0.0–38.1)

## 2019-02-13 NOTE — Telephone Encounter (Signed)
-----   Message from Heath Lark, MD sent at 02/13/2019  7:59 AM EDT ----- Regarding: CA-125 is stable Pls call and let her know

## 2019-02-13 NOTE — Telephone Encounter (Signed)
Pt called and given below msg.  No other needs at this time.

## 2019-02-18 ENCOUNTER — Encounter: Payer: Self-pay | Admitting: Hematology and Oncology

## 2019-03-02 ENCOUNTER — Other Ambulatory Visit: Payer: Self-pay | Admitting: Hematology and Oncology

## 2019-03-04 ENCOUNTER — Encounter: Payer: Self-pay | Admitting: Hematology and Oncology

## 2019-03-05 ENCOUNTER — Inpatient Hospital Stay: Payer: Medicare Other | Attending: Gynecology

## 2019-03-05 ENCOUNTER — Other Ambulatory Visit: Payer: Self-pay

## 2019-03-05 ENCOUNTER — Inpatient Hospital Stay: Payer: Medicare Other

## 2019-03-05 VITALS — BP 130/59 | HR 55 | Temp 98.1°F | Resp 16

## 2019-03-05 DIAGNOSIS — Z7189 Other specified counseling: Secondary | ICD-10-CM

## 2019-03-05 DIAGNOSIS — C561 Malignant neoplasm of right ovary: Secondary | ICD-10-CM

## 2019-03-05 DIAGNOSIS — Z5112 Encounter for antineoplastic immunotherapy: Secondary | ICD-10-CM | POA: Insufficient documentation

## 2019-03-05 LAB — CMP (CANCER CENTER ONLY)
ALT: 16 U/L (ref 0–44)
AST: 22 U/L (ref 15–41)
Albumin: 3.8 g/dL (ref 3.5–5.0)
Alkaline Phosphatase: 46 U/L (ref 38–126)
Anion gap: 12 (ref 5–15)
BUN: 30 mg/dL — ABNORMAL HIGH (ref 8–23)
CO2: 23 mmol/L (ref 22–32)
Calcium: 9.2 mg/dL (ref 8.9–10.3)
Chloride: 108 mmol/L (ref 98–111)
Creatinine: 1.02 mg/dL — ABNORMAL HIGH (ref 0.44–1.00)
GFR, Est AFR Am: >60 mL/min (ref >60)
GFR, Estimated: 53 mL/min — ABNORMAL LOW (ref >60)
Glucose, Bld: 84 mg/dL (ref 70–99)
Potassium: 4.4 mmol/L (ref 3.5–5.1)
Sodium: 143 mmol/L (ref 135–145)
Total Bilirubin: 0.3 mg/dL (ref 0.3–1.2)
Total Protein: 7 g/dL (ref 6.5–8.1)

## 2019-03-05 LAB — CBC WITH DIFFERENTIAL/PLATELET
Abs Immature Granulocytes: 0 10*3/uL (ref 0.00–0.07)
Basophils Absolute: 0.1 10*3/uL (ref 0.0–0.1)
Basophils Relative: 1 %
Eosinophils Absolute: 0.2 10*3/uL (ref 0.0–0.5)
Eosinophils Relative: 4 %
HCT: 36.7 % (ref 36.0–46.0)
Hemoglobin: 12.1 g/dL (ref 12.0–15.0)
Immature Granulocytes: 0 %
Lymphocytes Relative: 32 %
Lymphs Abs: 1.4 10*3/uL (ref 0.7–4.0)
MCH: 30.6 pg (ref 26.0–34.0)
MCHC: 33 g/dL (ref 30.0–36.0)
MCV: 92.9 fL (ref 80.0–100.0)
Monocytes Absolute: 0.5 10*3/uL (ref 0.1–1.0)
Monocytes Relative: 11 %
Neutro Abs: 2.3 10*3/uL (ref 1.7–7.7)
Neutrophils Relative %: 52 %
Platelets: 114 10*3/uL — ABNORMAL LOW (ref 150–400)
RBC: 3.95 MIL/uL (ref 3.87–5.11)
RDW: 13.8 % (ref 11.5–15.5)
WBC: 4.4 10*3/uL (ref 4.0–10.5)
nRBC: 0 % (ref 0.0–0.2)

## 2019-03-05 LAB — TOTAL PROTEIN, URINE DIPSTICK: Protein, ur: 30 mg/dL — AB

## 2019-03-05 MED ORDER — SODIUM CHLORIDE 0.9 % IV SOLN
15.0000 mg/kg | Freq: Once | INTRAVENOUS | Status: AC
  Administered 2019-03-05: 14:00:00 1000 mg via INTRAVENOUS
  Filled 2019-03-05: qty 32

## 2019-03-05 MED ORDER — HEPARIN SOD (PORK) LOCK FLUSH 100 UNIT/ML IV SOLN
500.0000 [IU] | Freq: Once | INTRAVENOUS | Status: AC | PRN
  Administered 2019-03-05: 15:00:00 500 [IU]
  Filled 2019-03-05: qty 5

## 2019-03-05 MED ORDER — SODIUM CHLORIDE 0.9 % IV SOLN
Freq: Once | INTRAVENOUS | Status: AC
  Administered 2019-03-05: 13:00:00 via INTRAVENOUS
  Filled 2019-03-05: qty 250

## 2019-03-05 MED ORDER — SODIUM CHLORIDE 0.9% FLUSH
10.0000 mL | Freq: Once | INTRAVENOUS | Status: AC
  Administered 2019-03-05: 13:00:00 10 mL
  Filled 2019-03-05: qty 10

## 2019-03-05 MED ORDER — SODIUM CHLORIDE 0.9% FLUSH
10.0000 mL | INTRAVENOUS | Status: DC | PRN
  Administered 2019-03-05: 15:00:00 10 mL
  Filled 2019-03-05: qty 10

## 2019-03-05 NOTE — Patient Instructions (Signed)

## 2019-03-05 NOTE — Patient Instructions (Signed)
[facility] Discharge Instructions for Patients Receiving Chemotherapy  Today you received the following chemotherapy agents: Bevacizumab (Avastin)  To help prevent nausea and vomiting after your treatment, we encourage you to take your nausea medication as directed.    If you develop nausea and vomiting that is not controlled by your nausea medication, call the clinic.   BELOW ARE SYMPTOMS THAT SHOULD BE REPORTED IMMEDIATELY:  *FEVER GREATER THAN 100.5 F  *CHILLS WITH OR WITHOUT FEVER  NAUSEA AND VOMITING THAT IS NOT CONTROLLED WITH YOUR NAUSEA MEDICATION  *UNUSUAL SHORTNESS OF BREATH  *UNUSUAL BRUISING OR BLEEDING  TENDERNESS IN MOUTH AND THROAT WITH OR WITHOUT PRESENCE OF ULCERS  *URINARY PROBLEMS  *BOWEL PROBLEMS  UNUSUAL RASH Items with * indicate a potential emergency and should be followed up as soon as possible.  Feel free to call the clinic should you have any questions or concerns. The clinic phone number is [phone].  Please show the CHEMO ALERT CARD at check-in to the Emergency Department and triage nurse.   

## 2019-03-06 LAB — CA 125: Cancer Antigen (CA) 125: 22.2 U/mL (ref 0.0–38.1)

## 2019-03-13 ENCOUNTER — Telehealth: Payer: Self-pay

## 2019-03-13 ENCOUNTER — Encounter: Payer: Self-pay | Admitting: Hematology and Oncology

## 2019-03-13 NOTE — Telephone Encounter (Signed)
Spoke with pt by phone regarding increasing her HCTZ dose or Lisinopril per Dr Alvy Bimler. Pt prefers to increase HCTZ at this time because she can break pills in have to equal 37.5mg s. Pt also advised on importance of low sodium diet.   Pt verbalizes understanding to watch salt intake and increase HCTZ to 37.5mg  daily.

## 2019-03-14 ENCOUNTER — Other Ambulatory Visit: Payer: Self-pay | Admitting: Hematology and Oncology

## 2019-03-14 MED ORDER — HYDROCHLOROTHIAZIDE 25 MG PO TABS: 37.5000 mg | ORAL_TABLET | Freq: Every day | ORAL | 9 refills | Status: DC

## 2019-03-25 ENCOUNTER — Encounter: Payer: Self-pay | Admitting: Hematology and Oncology

## 2019-03-26 ENCOUNTER — Inpatient Hospital Stay: Payer: Medicare Other | Attending: Gynecology

## 2019-03-26 ENCOUNTER — Inpatient Hospital Stay: Payer: Medicare Other | Admitting: Hematology and Oncology

## 2019-03-26 ENCOUNTER — Other Ambulatory Visit: Payer: Self-pay

## 2019-03-26 ENCOUNTER — Inpatient Hospital Stay: Payer: Medicare Other

## 2019-03-26 DIAGNOSIS — C561 Malignant neoplasm of right ovary: Secondary | ICD-10-CM

## 2019-03-26 DIAGNOSIS — Z5112 Encounter for antineoplastic immunotherapy: Secondary | ICD-10-CM | POA: Insufficient documentation

## 2019-03-26 DIAGNOSIS — D696 Thrombocytopenia, unspecified: Secondary | ICD-10-CM | POA: Diagnosis not present

## 2019-03-26 DIAGNOSIS — I10 Essential (primary) hypertension: Secondary | ICD-10-CM | POA: Diagnosis not present

## 2019-03-26 DIAGNOSIS — Z7189 Other specified counseling: Secondary | ICD-10-CM

## 2019-03-26 LAB — CBC WITH DIFFERENTIAL/PLATELET
Abs Immature Granulocytes: 0 10*3/uL (ref 0.00–0.07)
Basophils Absolute: 0.1 10*3/uL (ref 0.0–0.1)
Basophils Relative: 1 %
Eosinophils Absolute: 0.2 10*3/uL (ref 0.0–0.5)
Eosinophils Relative: 6 %
HCT: 38.1 % (ref 36.0–46.0)
Hemoglobin: 12.4 g/dL (ref 12.0–15.0)
Immature Granulocytes: 0 %
Lymphocytes Relative: 32 %
Lymphs Abs: 1.3 10*3/uL (ref 0.7–4.0)
MCH: 30.3 pg (ref 26.0–34.0)
MCHC: 32.5 g/dL (ref 30.0–36.0)
MCV: 93.2 fL (ref 80.0–100.0)
Monocytes Absolute: 0.5 10*3/uL (ref 0.1–1.0)
Monocytes Relative: 12 %
Neutro Abs: 2 10*3/uL (ref 1.7–7.7)
Neutrophils Relative %: 49 %
Platelets: 101 10*3/uL — ABNORMAL LOW (ref 150–400)
RBC: 4.09 MIL/uL (ref 3.87–5.11)
RDW: 13.6 % (ref 11.5–15.5)
WBC: 4 10*3/uL (ref 4.0–10.5)
nRBC: 0 % (ref 0.0–0.2)

## 2019-03-26 LAB — COMPREHENSIVE METABOLIC PANEL
ALT: 15 U/L (ref 0–44)
AST: 25 U/L (ref 15–41)
Albumin: 3.7 g/dL (ref 3.5–5.0)
Alkaline Phosphatase: 49 U/L (ref 38–126)
Anion gap: 9 (ref 5–15)
BUN: 29 mg/dL — ABNORMAL HIGH (ref 8–23)
CO2: 22 mmol/L (ref 22–32)
Calcium: 9.4 mg/dL (ref 8.9–10.3)
Chloride: 110 mmol/L (ref 98–111)
Creatinine, Ser: 0.85 mg/dL (ref 0.44–1.00)
GFR calc Af Amer: >60 mL/min (ref >60)
GFR calc non Af Amer: >60 mL/min (ref >60)
Glucose, Bld: 85 mg/dL (ref 70–99)
Potassium: 4.2 mmol/L (ref 3.5–5.1)
Sodium: 141 mmol/L (ref 135–145)
Total Bilirubin: 0.4 mg/dL (ref 0.3–1.2)
Total Protein: 7 g/dL (ref 6.5–8.1)

## 2019-03-26 LAB — TOTAL PROTEIN, URINE DIPSTICK: Protein, ur: NEGATIVE mg/dL

## 2019-03-26 MED ORDER — HYDROCHLOROTHIAZIDE 25 MG PO TABS: 37.5000 mg | ORAL_TABLET | Freq: Every day | ORAL | 9 refills | Status: DC

## 2019-03-26 MED ORDER — SODIUM CHLORIDE 0.9 % IV SOLN
15.0000 mg/kg | Freq: Once | INTRAVENOUS | Status: AC
  Administered 2019-03-26: 12:00:00 1000 mg via INTRAVENOUS
  Filled 2019-03-26: qty 32

## 2019-03-26 MED ORDER — SODIUM CHLORIDE 0.9% FLUSH
10.0000 mL | INTRAVENOUS | Status: DC | PRN
  Administered 2019-03-26: 13:00:00 10 mL
  Filled 2019-03-26: qty 10

## 2019-03-26 MED ORDER — HEPARIN SOD (PORK) LOCK FLUSH 100 UNIT/ML IV SOLN
500.0000 [IU] | Freq: Once | INTRAVENOUS | Status: AC | PRN
  Administered 2019-03-26: 13:00:00 500 [IU]
  Filled 2019-03-26: qty 5

## 2019-03-26 MED ORDER — SODIUM CHLORIDE 0.9 % IV SOLN
Freq: Once | INTRAVENOUS | Status: AC
  Administered 2019-03-26: 12:00:00 via INTRAVENOUS
  Filled 2019-03-26: qty 250

## 2019-03-26 MED ORDER — SODIUM CHLORIDE 0.9% FLUSH
10.0000 mL | Freq: Once | INTRAVENOUS | Status: AC
  Administered 2019-03-26: 10:00:00 10 mL
  Filled 2019-03-26: qty 10

## 2019-03-26 NOTE — Patient Instructions (Signed)

## 2019-03-26 NOTE — Patient Instructions (Signed)
[facility] Discharge Instructions for Patients Receiving Chemotherapy  Today you received the following chemotherapy agents Bevacizumab (AVASTIN).  To help prevent nausea and vomiting after your treatment, we encourage you to take your nausea medication as prescribed.   If you develop nausea and vomiting that is not controlled by your nausea medication, call the clinic.   BELOW ARE SYMPTOMS THAT SHOULD BE REPORTED IMMEDIATELY:  *FEVER GREATER THAN 100.5 F  *CHILLS WITH OR WITHOUT FEVER  NAUSEA AND VOMITING THAT IS NOT CONTROLLED WITH YOUR NAUSEA MEDICATION  *UNUSUAL SHORTNESS OF BREATH  *UNUSUAL BRUISING OR BLEEDING  TENDERNESS IN MOUTH AND THROAT WITH OR WITHOUT PRESENCE OF ULCERS  *URINARY PROBLEMS  *BOWEL PROBLEMS  UNUSUAL RASH Items with * indicate a potential emergency and should be followed up as soon as possible.  Feel free to call the clinic should you have any questions or concerns. The clinic phone number is [phone].  Please show the [place] at check-in to the Emergency Department and triage nurse.   Coronavirus (COVID-19) Are you at risk?  Are you at risk for the Coronavirus (COVID-19)?  To be considered HIGH RISK for Coronavirus (COVID-19), you have to meet the following criteria:  . Traveled to [place], [place], [place], [place] or [place]; or in the [place] to [place], [place], [place], or [place]; and have fever, cough, and shortness of breath within the last 2 weeks of travel OR . Been in close contact with a person diagnosed with COVID-19 within the last 2 weeks and have fever, cough, and shortness of breath . IF YOU DO NOT MEET THESE CRITERIA, YOU ARE CONSIDERED LOW RISK FOR COVID-19.  What to do if you are HIGH RISK for COVID-19?  [name] If you are having a medical emergency, call 911. . Seek medical care right away. Before you go to a doctor's office, urgent care or emergency department, call ahead and tell them  about your recent travel, contact with someone diagnosed with COVID-19, and your symptoms. You should receive instructions from your physician's office regarding next steps of care.  . When you arrive at healthcare provider, tell the healthcare staff immediately you have returned from visiting [place], [place], [place], [place] or [place]; or traveled in the [place] to [place], [place], [place], or [place]; in the last two weeks or you have been in close contact with a person diagnosed with COVID-19 in the last 2 weeks.   . Tell the health care staff about your symptoms: fever, cough and shortness of breath. . After you have been seen by a medical provider, you will be either: o Tested for (COVID-19) and discharged home on quarantine except to seek medical care if symptoms worsen, and asked to  - Stay home and avoid contact with others until you get your results (4-5 days)  - Avoid travel on public transportation if possible (such as bus, train, or airplane) or o Sent to the Emergency Department by EMS for evaluation, COVID-19 testing, and possible admission depending on your condition and test results.  What to do if you are LOW RISK for COVID-19?  Reduce your risk of any infection by using the same precautions used for avoiding the common cold or flu:  [name] Wash your hands often with soap and warm water for at least 20 seconds.  If soap and water are not readily available, use an alcohol-based hand sanitizer with at least 60% alcohol.  . If coughing  or sneezing, cover your mouth and nose by coughing or sneezing into the elbow areas of your shirt or coat, into a tissue or into your sleeve (not your hands). . Avoid shaking hands with others and consider head nods or verbal greetings only. . Avoid touching your eyes, nose, or mouth with unwashed hands.  . Avoid close contact with people who are sick. . Avoid places or events with large numbers of people in one location, like concerts or  sporting events. . Carefully consider travel plans you have or are making. . If you are planning any travel outside or inside the [place], visit the CDC's Travelers' Health webpage for the latest health notices. . If you have some symptoms but not all symptoms, continue to monitor at home and seek medical attention if your symptoms worsen. . If you are having a medical emergency, call 911.   ADDITIONAL HEALTHCARE OPTIONS FOR PATIENTS  [facility] / e-Visit: [email or web address]         [facility]: [phone]  [facility]: [phone]                   [facility]: [phone]   

## 2019-03-27 ENCOUNTER — Telehealth: Payer: Self-pay | Admitting: Hematology and Oncology

## 2019-03-27 ENCOUNTER — Encounter: Payer: Self-pay | Admitting: Hematology and Oncology

## 2019-03-27 NOTE — Progress Notes (Signed)
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  Essential hypertension Her blood pressure is satisfactory She has no signs of proteinuria She will continue her current prescriptions of blood pressure medicines  Thrombocytopenia (HCC) Her pancytopenia is gradually improving with time away from treatment.  Observe only She has mild chronic thrombocytopenia but not symptomatic   No orders of the defined types were placed in this encounter.   INTERVAL HISTORY: Please see below for problem oriented charting. She returns for further follow-up She feels well Denies recent headache No recent bleeding Denies abdominal pain, bloating or changes in bowel habits  SUMMARY OF ONCOLOGIC HISTORY: Oncology History   Neg genetics. Additional tests revealed ER: 80%, PR 3%      Right ovarian epithelial cancer ([place])   07/01/2015 Initial Diagnosis    She presented with postmenopausal bleeding    07/02/2015 Imaging    US pelvis showed bilateral ovarian cyst    07/13/2015 Imaging    5.2 cm left adnexal cystic lesion, with indeterminate but probably benign characteristics. In a postmenopausal female, consider continued annual imaging followup with CT or MRI versus surgical evaluation.  Colonic diverticulosis. No radiographic evidence of diverticulitis.  Cholelithiasis.  No radiographic evidence of cholecystitis.  Small hiatal hernia.    07/30/2015 Pathology Results    Outside pathology showed right ovary with high-grade serous carcinoma, 2 cm in maximum dimension.  The tumor involves serosal surface of the right ovary and adjacent right fallopian tube.   Cervix and endometrium were within normal limits. ER positive Peritoneal washing was positive.    07/30/2015 Surgery    SHe underwent laparoscopic-assisted total vaginal hysterectomy, bilateral salpingo-oophorectomy    07/30/2015 Pathology Results    PERITONEAL WASHING PELVIC (SPECIMEN 1 OF 1, COLLECTED ON 07/30/2015): MALIGNANT CELLS CONSISTENT WITH HIGH GRADE CARCINOMA    08/13/2015 Tumor Marker    Patient's tumor was tested for the following markers: CA-125 Results of the tumor marker test revealed 96    08/25/2015 - 12/08/2015 Chemotherapy    The patient had 6 cycles of carboplatin and taxol    09/07/2015 Tumor Marker    Patient's tumor was tested for the following markers: CA-125 Results of the tumor marker test revealed 51    10/05/2015 Tumor Marker    Patient's tumor was tested for the following markers: CA-125 Results of the tumor marker test revealed 29    11/09/2015 Tumor Marker    Patient's tumor was tested for the following markers: CA-125 Results of the tumor marker test revealed 44    12/08/2015 Tumor Marker    Patient's tumor was tested for the following markers: CA-125 Results of the tumor marker test revealed 30    12/15/2015 Genetic Testing    Genetics testing normal by [facility] Breast Ovarian panel 12-15-15    01/11/2016 Imaging    1. The only finding of note are several adjacent peripheral abnormal hypodensities along the anterior superior spleen margin, differential diagnostic considerations including small peripheral interval splenic infarcts, splenic injury with subcapsular a fluid collections, or less likely metastatic disease to the splenic margin. This likely merits observation. 2. Other imaging findings of potential clinical significance: Small type 1 hiatal  hernia. Aortoiliac atherosclerotic vascular disease. Lower lumbar spondylosis and degenerative disc disease. Gallstones with potential mild gallbladder wall thickening.    03/07/2016 Tumor Marker    Patient's  tumor was tested for the following markers: CA-125 Results of the tumor marker test revealed 24.2    04/06/2016 Imaging    1. No evidence of a ovarian cancer metastasis. 2. No ascites. 3. Post hysterectomy and oophorectomy. 4. Cholelithiasis with several large gallstones    04/06/2016 Tumor Marker    Patient's tumor was tested for the following markers: CA-125 Results of the tumor marker test revealed 22.8    05/30/2016 Tumor Marker    Patient's tumor was tested for the following markers: CA-125 Results of the tumor marker test revealed 21    09/07/2016 Tumor Marker    Patient's tumor was tested for the following markers: CA-125 Results of the tumor marker test revealed 22.4    11/28/2016 Tumor Marker    Patient's tumor was tested for the following markers: CA-125 Results of the tumor marker test revealed 18.8    02/23/2017 Tumor Marker    Patient's tumor was tested for the following markers: CA-125 Results of the tumor marker test revealed 19.1    06/02/2017 Tumor Marker    Patient's tumor was tested for the following markers: CA-125 Results of the tumor marker test revealed 18.3    08/24/2017 Mammogram    Pt reports mammogram complete    08/28/2017 Tumor Marker    Patient's tumor was tested for the following markers: CA-125 Results of the tumor marker test revealed 21.1    12/01/2017 Tumor Marker    Patient's tumor was tested for the following markers: CA-125 Results of the tumor marker test revealed 31.2    12/13/2017 Imaging    New 2.7 cm peritoneal soft tissue mass in anterior left lower quadrant, consistent with metastatic disease.  No other sites of metastatic disease identified.  Incidental findings including: Cholelithiasis. Colonic diverticulosis. Tiny hiatal hernia. Aortic atherosclerosis.    01/09/2018 - 05/07/2018 Chemotherapy    The patient had carboplatin and taxol; After 2nd cycle, she developed carboplatin allergy. She received carboplatin desensitization  protocol at [facility] for final 4 cycles, completed by 6/17. Avastin was added from 04/16/18 onwards    01/30/2018 Adverse Reaction    Potential carboplatin allergy is suspected. She received half the dose of prescribed carboplatin on cycle 2    05/31/2018 Tumor Marker    Patient's tumor was tested for the following markers: CA-125 Results of the tumor marker test revealed 26    05/31/2018 Imaging    1. Peritoneal soft tissue nodule identified on the previous study has decreased in the interval the. No progressive findings in the abdomen or pelvis on today's study to suggest disease progression. 2. Tiny pulmonary nodules, likely benign. Attention on follow-up recommended. 3. Cholelithiasis. 4.  Aortic Atherosclerois (ICD10-170.0) 5. Tiny hiatal hernia.      06/04/2018 -  Chemotherapy    The patient is placed on Avastin for maintenance    06/25/2018 Tumor Marker    Patient's tumor was tested for the following markers: CA-125 Results of the tumor marker test revealed 22.5    08/06/2018 Tumor Marker    Patient's tumor was tested for the following markers: CA-125 Results of the tumor marker test revealed 20.7    09/14/2018 Imaging    Status post hysterectomy and bilateral salpingo-oophorectomy.  Stable soft tissue nodule beneath the left lower anterior abdominal wall, likely reflecting stable peritoneal disease.  Scattered  small subpleural nodules in the lungs bilaterally, measuring up to 3 mm, technically indeterminate although likely benign. Please note that Fleischner Society guidelines do not apply. Attention on follow-up is suggested.  No evidence of new/progressive metastatic disease.    09/14/2018 Tumor Marker    Patient's tumor was tested for the following markers: CA-125 Results of the tumor marker test revealed 21.3    10/30/2018 Tumor Marker    Patient's tumor was tested for the following markers: CA-125 Results of the tumor marker test revealed 20.8    12/12/2018 Tumor Marker     Patient's tumor was tested for the following markers: CA-125 Results of the tumor marker test revealed 19.7    01/01/2019 Tumor Marker    Patient's tumor was tested for the following markers: CA-125 Results of the tumor marker test revealed 21.6    01/22/2019 Tumor Marker    Patient's tumor was tested for the following markers: CA-125 Results of the tumor marker test revealed 21.3    02/12/2019 Tumor Marker    Patient's tumor was tested for the following markers: CA-125 Results of the tumor marker test revealed 21.6     REVIEW OF SYSTEMS:   Constitutional: Denies fevers, chills or abnormal weight loss Eyes: Denies blurriness of vision Ears, nose, mouth, throat, and face: Denies mucositis or sore throat Respiratory: Denies cough, dyspnea or wheezes Cardiovascular: Denies palpitation, chest discomfort or lower extremity swelling Gastrointestinal:  Denies nausea, heartburn or change in bowel habits Skin: Denies abnormal skin rashes Lymphatics: Denies new lymphadenopathy or easy bruising Neurological:Denies numbness, tingling or new weaknesses Behavioral/Psych: Mood is stable, no new changes  All other systems were reviewed with the patient and are negative.  I have reviewed the past medical history, past surgical history, social history and family history with the patient and they are unchanged from previous note.  ALLERGIES:  is allergic to carboplatin.  MEDICATIONS:  Current Outpatient Medications  Medication Sig Dispense Refill  . amLODipine (NORVASC) 10 MG tablet Take 1 tablet (10 mg total) by mouth daily. 90 tablet 3  . atenolol (TENORMIN) 25 MG tablet TAKE ONE TABLET BY MOUTH DAILY 30 tablet 0  . atorvastatin (LIPITOR) 10 MG tablet Take 1 tablet (10 mg total) by mouth daily. 30 tablet 2  . Coenzyme Q10 (CO Q 10) 100 MG CAPS Take 1 capsule by mouth daily.     . Glucosamine-Chondroit-Vit C-Mn (GLUCOSAMINE 1500 COMPLEX PO) Take 1 capsule by mouth 2 (two) times daily.    .  hydrochlorothiazide (HYDRODIURIL) 25 MG tablet Take 1.5 tablets (37.5 mg total) by mouth daily. 90 tablet 9  . lidocaine-prilocaine (EMLA) cream Apply to affected area once 30 g 3  . lisinopril (PRINIVIL,ZESTRIL) 30 MG tablet Take 1 tablet (30 mg total) by mouth daily. 30 tablet 11  . loratadine (CLARITIN) 10 MG tablet Take 10 mg by mouth daily as needed for allergies (hives).    . Multiple Vitamin (MULTIVITAMIN) capsule Take 1 capsule by mouth daily.     . Omega-3 Fatty Acids (OMEGA-3 FISH OIL) 300 MG CAPS Take 1 capsule by mouth daily.     . ondansetron (ZOFRAN) 8 MG tablet Take 1 tablet (8 mg total) by mouth 2 (two) times daily as needed for refractory nausea / vomiting. Start on day 3 after chemo. 30 tablet 1  . Polyethyl Glycol-Propyl Glycol (SYSTANE ULTRA OP) Apply 1 drop to eye 2 (two) times daily at 10 AM and 5 PM.    . Probiotic Product (PROBIOTIC ADVANCED PO)  Take 1 capsule by mouth daily.    . prochlorperazine (COMPAZINE) 10 MG tablet Take 1 tablet (10 mg total) by mouth every 6 (six) hours as needed (Nausea or vomiting). 60 tablet 1  . vitamin E 400 UNIT capsule Take 400 Units by mouth every evening.      No current facility-administered medications for this visit.     PHYSICAL EXAMINATION: ECOG PERFORMANCE STATUS: 1 - Symptomatic but completely ambulatory  Vitals:   03/26/19 1007  BP: (!) 145/50  Pulse: 62  Resp: 18  Temp: (!) 97.5 F (36.4 C)  SpO2: 100%   Filed Weights   03/26/19 1007  Weight: 141 lb 3.2 oz (64 kg)    GENERAL:alert, no distress and comfortable SKIN: skin color, texture, turgor are normal, no rashes or significant lesions EYES: normal, Conjunctiva are pink and non-injected, sclera clear OROPHARYNX:no exudate, no erythema and lips, buccal mucosa, and tongue normal  NECK: supple, thyroid normal size, non-tender, without nodularity LYMPH:  no palpable lymphadenopathy in the cervical, axillary or inguinal LUNGS: clear to auscultation and percussion  with normal breathing effort HEART: regular rate & rhythm and no murmurs and no lower extremity edema ABDOMEN:abdomen soft, non-tender and normal bowel sounds Musculoskeletal:no cyanosis of digits and no clubbing  NEURO: alert & oriented x 3 with fluent speech, no focal motor/sensory deficits  LABORATORY DATA:  I have reviewed the data as listed    Component Value Date/Time   NA 141 03/26/2019 0950   NA 142 11/28/2016 0916   K 4.2 03/26/2019 0950   K 3.9 11/28/2016 0916   CL 110 03/26/2019 0950   CO2 22 03/26/2019 0950   CO2 28 11/28/2016 0916   GLUCOSE 85 03/26/2019 0950   GLUCOSE 78 11/28/2016 0916   BUN 29 (H) 03/26/2019 0950   BUN 16.8 11/28/2016 0916   CREATININE 0.85 03/26/2019 0950   CREATININE 1.02 (H) 03/05/2019 1230   CREATININE 0.8 11/28/2016 0916   CALCIUM 9.4 03/26/2019 0950   CALCIUM 10.5 (H) 11/28/2016 0916   PROT 7.0 03/26/2019 0950   PROT 7.3 11/28/2016 0916   ALBUMIN 3.7 03/26/2019 0950   ALBUMIN 4.3 11/28/2016 0916   AST 25 03/26/2019 0950   AST 22 03/05/2019 1230   AST 19 11/28/2016 0916   ALT 15 03/26/2019 0950   ALT 16 03/05/2019 1230   ALT 19 11/28/2016 0916   ALKPHOS 49 03/26/2019 0950   ALKPHOS 50 11/28/2016 0916   BILITOT 0.4 03/26/2019 0950   BILITOT 0.3 03/05/2019 1230   BILITOT 0.48 11/28/2016 0916   GFRNONAA >60 03/26/2019 0950   GFRNONAA 53 (L) 03/05/2019 1230   GFRAA >60 03/26/2019 0950   GFRAA >60 03/05/2019 1230    No results found for: SPEP, UPEP  Lab Results  Component Value Date   WBC 4.0 03/26/2019   NEUTROABS 2.0 03/26/2019   HGB 12.4 03/26/2019   HCT 38.1 03/26/2019   MCV 93.2 03/26/2019   PLT 101 (L) 03/26/2019      Chemistry      Component Value Date/Time   NA 141 03/26/2019 0950   NA 142 11/28/2016 0916   K 4.2 03/26/2019 0950   K 3.9 11/28/2016 0916   CL 110 03/26/2019 0950   CO2 22 03/26/2019 0950   CO2 28 11/28/2016 0916   BUN 29 (H) 03/26/2019 0950   BUN 16.8 11/28/2016 0916   CREATININE 0.85  03/26/2019 0950   CREATININE 1.02 (H) 03/05/2019 1230   CREATININE 0.8 11/28/2016 0916  Component Value Date/Time   CALCIUM 9.4 03/26/2019 0950   CALCIUM 10.5 (H) 11/28/2016 0916   ALKPHOS 49 03/26/2019 0950   ALKPHOS 50 11/28/2016 0916   AST 25 03/26/2019 0950   AST 22 03/05/2019 1230   AST 19 11/28/2016 0916   ALT 15 03/26/2019 0950   ALT 16 03/05/2019 1230   ALT 19 11/28/2016 0916   BILITOT 0.4 03/26/2019 0950   BILITOT 0.3 03/05/2019 1230   BILITOT 0.48 11/28/2016 0916      All questions were answered. The patient knows to call the clinic with any problems, questions or concerns. No barriers to learning was detected.  I spent 15 minutes counseling the patient face to face. The total time spent in the appointment was 20 minutes and more than 50% was on counseling and review of test results  Heath Lark, MD 03/27/2019 8:11 AM

## 2019-03-27 NOTE — Assessment & Plan Note (Signed)
Her blood pressure is satisfactory She has no signs of proteinuria She will continue her current prescriptions of blood pressure medicines

## 2019-03-27 NOTE — Assessment & Plan Note (Signed)
Her pancytopenia is gradually improving with time away from treatment.  Observe only She has mild chronic thrombocytopenia but not symptomatic

## 2019-03-27 NOTE — Telephone Encounter (Signed)
Scheduled appt per 5/6 sch message - pt to get an updated schedule next visit.

## 2019-03-27 NOTE — Assessment & Plan Note (Signed)
She has no symptoms of recurrence Her recent tumor marker is stable I do not recommend rechecking CT for a while unless she is symptomatic with rising tumor marker She will continue bevacizumab indefinitely.

## 2019-04-04 ENCOUNTER — Other Ambulatory Visit: Payer: Self-pay | Admitting: Hematology and Oncology

## 2019-04-16 ENCOUNTER — Inpatient Hospital Stay: Payer: Medicare Other

## 2019-04-16 ENCOUNTER — Other Ambulatory Visit: Payer: Self-pay

## 2019-04-16 VITALS — BP 148/62 | HR 56 | Temp 98.7°F | Resp 16

## 2019-04-16 DIAGNOSIS — Z7189 Other specified counseling: Secondary | ICD-10-CM

## 2019-04-16 DIAGNOSIS — C561 Malignant neoplasm of right ovary: Secondary | ICD-10-CM

## 2019-04-16 DIAGNOSIS — Z5112 Encounter for antineoplastic immunotherapy: Secondary | ICD-10-CM | POA: Diagnosis not present

## 2019-04-16 LAB — CBC WITH DIFFERENTIAL/PLATELET
Abs Immature Granulocytes: 0 10*3/uL (ref 0.00–0.07)
Basophils Absolute: 0 10*3/uL (ref 0.0–0.1)
Basophils Relative: 1 %
Eosinophils Absolute: 0.2 10*3/uL (ref 0.0–0.5)
Eosinophils Relative: 5 %
HCT: 37 % (ref 36.0–46.0)
Hemoglobin: 11.9 g/dL — ABNORMAL LOW (ref 12.0–15.0)
Immature Granulocytes: 0 %
Lymphocytes Relative: 30 %
Lymphs Abs: 1.2 10*3/uL (ref 0.7–4.0)
MCH: 30.1 pg (ref 26.0–34.0)
MCHC: 32.2 g/dL (ref 30.0–36.0)
MCV: 93.7 fL (ref 80.0–100.0)
Monocytes Absolute: 0.4 10*3/uL (ref 0.1–1.0)
Monocytes Relative: 11 %
Neutro Abs: 2 10*3/uL (ref 1.7–7.7)
Neutrophils Relative %: 53 %
Platelets: 111 10*3/uL — ABNORMAL LOW (ref 150–400)
RBC: 3.95 MIL/uL (ref 3.87–5.11)
RDW: 13.6 % (ref 11.5–15.5)
WBC: 3.9 10*3/uL — ABNORMAL LOW (ref 4.0–10.5)
nRBC: 0 % (ref 0.0–0.2)

## 2019-04-16 LAB — COMPREHENSIVE METABOLIC PANEL
ALT: 19 U/L (ref 0–44)
AST: 30 U/L (ref 15–41)
Albumin: 3.7 g/dL (ref 3.5–5.0)
Alkaline Phosphatase: 48 U/L (ref 38–126)
Anion gap: 7 (ref 5–15)
BUN: 30 mg/dL — ABNORMAL HIGH (ref 8–23)
CO2: 24 mmol/L (ref 22–32)
Calcium: 9.2 mg/dL (ref 8.9–10.3)
Chloride: 109 mmol/L (ref 98–111)
Creatinine, Ser: 1.03 mg/dL — ABNORMAL HIGH (ref 0.44–1.00)
GFR calc Af Amer: >60 mL/min (ref >60)
GFR calc non Af Amer: 52 mL/min — ABNORMAL LOW (ref >60)
Glucose, Bld: 102 mg/dL — ABNORMAL HIGH (ref 70–99)
Potassium: 4 mmol/L (ref 3.5–5.1)
Sodium: 140 mmol/L (ref 135–145)
Total Bilirubin: 0.2 mg/dL — ABNORMAL LOW (ref 0.3–1.2)
Total Protein: 6.7 g/dL (ref 6.5–8.1)

## 2019-04-16 LAB — TOTAL PROTEIN, URINE DIPSTICK: Protein, ur: 30 mg/dL — AB

## 2019-04-16 MED ORDER — SODIUM CHLORIDE 0.9 % IV SOLN
Freq: Once | INTRAVENOUS | Status: AC
  Administered 2019-04-16: 13:00:00 via INTRAVENOUS
  Filled 2019-04-16: qty 250

## 2019-04-16 MED ORDER — SODIUM CHLORIDE 0.9 % IV SOLN
15.0000 mg/kg | Freq: Once | INTRAVENOUS | Status: AC
  Administered 2019-04-16: 1000 mg via INTRAVENOUS
  Filled 2019-04-16: qty 32

## 2019-04-16 MED ORDER — SODIUM CHLORIDE 0.9% FLUSH
10.0000 mL | INTRAVENOUS | Status: DC | PRN
  Administered 2019-04-16: 10 mL
  Filled 2019-04-16: qty 10

## 2019-04-16 MED ORDER — HEPARIN SOD (PORK) LOCK FLUSH 100 UNIT/ML IV SOLN
500.0000 [IU] | Freq: Once | INTRAVENOUS | Status: AC | PRN
  Administered 2019-04-16: 500 [IU]
  Filled 2019-04-16: qty 5

## 2019-04-16 NOTE — Patient Instructions (Signed)
[facility] Discharge Instructions for Patients Receiving Chemotherapy  Today you received the following chemotherapy agents Bevacizumab (AVASTIN).  To help prevent nausea and vomiting after your treatment, we encourage you to take your nausea medication as prescribed.   If you develop nausea and vomiting that is not controlled by your nausea medication, call the clinic.   BELOW ARE SYMPTOMS THAT SHOULD BE REPORTED IMMEDIATELY:  *FEVER GREATER THAN 100.5 F  *CHILLS WITH OR WITHOUT FEVER  NAUSEA AND VOMITING THAT IS NOT CONTROLLED WITH YOUR NAUSEA MEDICATION  *UNUSUAL SHORTNESS OF BREATH  *UNUSUAL BRUISING OR BLEEDING  TENDERNESS IN MOUTH AND THROAT WITH OR WITHOUT PRESENCE OF ULCERS  *URINARY PROBLEMS  *BOWEL PROBLEMS  UNUSUAL RASH Items with * indicate a potential emergency and should be followed up as soon as possible.  Feel free to call the clinic should you have any questions or concerns. The clinic phone number is [phone].  Please show the [place] at check-in to the Emergency Department and triage nurse.   Coronavirus (COVID-19) Are you at risk?  Are you at risk for the Coronavirus (COVID-19)?  To be considered HIGH RISK for Coronavirus (COVID-19), you have to meet the following criteria:  . Traveled to [place], [place], [place], [place] or [place]; or in the [place] to [place], [place], [place], or [place]; and have fever, cough, and shortness of breath within the last 2 weeks of travel OR . Been in close contact with a person diagnosed with COVID-19 within the last 2 weeks and have fever, cough, and shortness of breath . IF YOU DO NOT MEET THESE CRITERIA, YOU ARE CONSIDERED LOW RISK FOR COVID-19.  What to do if you are HIGH RISK for COVID-19?  [name] If you are having a medical emergency, call 911. . Seek medical care right away. Before you go to a doctor's office, urgent care or emergency department, call ahead and tell them  about your recent travel, contact with someone diagnosed with COVID-19, and your symptoms. You should receive instructions from your physician's office regarding next steps of care.  . When you arrive at healthcare provider, tell the healthcare staff immediately you have returned from visiting [place], [place], [place], [place] or [place]; or traveled in the [place] to [place], [place], [place], or [place]; in the last two weeks or you have been in close contact with a person diagnosed with COVID-19 in the last 2 weeks.   . Tell the health care staff about your symptoms: fever, cough and shortness of breath. . After you have been seen by a medical provider, you will be either: o Tested for (COVID-19) and discharged home on quarantine except to seek medical care if symptoms worsen, and asked to  - Stay home and avoid contact with others until you get your results (4-5 days)  - Avoid travel on public transportation if possible (such as bus, train, or airplane) or o Sent to the Emergency Department by EMS for evaluation, COVID-19 testing, and possible admission depending on your condition and test results.  What to do if you are LOW RISK for COVID-19?  Reduce your risk of any infection by using the same precautions used for avoiding the common cold or flu:  [name] Wash your hands often with soap and warm water for at least 20 seconds.  If soap and water are not readily available, use an alcohol-based hand sanitizer with at least 60% alcohol.  . If coughing  or sneezing, cover your mouth and nose by coughing or sneezing into the elbow areas of your shirt or coat, into a tissue or into your sleeve (not your hands). . Avoid shaking hands with others and consider head nods or verbal greetings only. . Avoid touching your eyes, nose, or mouth with unwashed hands.  . Avoid close contact with people who are sick. . Avoid places or events with large numbers of people in one location, like concerts or  sporting events. . Carefully consider travel plans you have or are making. . If you are planning any travel outside or inside the [place], visit the CDC's Travelers' Health webpage for the latest health notices. . If you have some symptoms but not all symptoms, continue to monitor at home and seek medical attention if your symptoms worsen. . If you are having a medical emergency, call 911.   ADDITIONAL HEALTHCARE OPTIONS FOR PATIENTS  [facility] / e-Visit: [email or web address]         [facility]: [phone]  [facility]: [phone]                   [facility]: [phone]   

## 2019-04-16 NOTE — Patient Instructions (Signed)
[facility] Discharge Instructions for Patients Receiving Chemotherapy  Today you received the following chemotherapy agents Avastin  To help prevent nausea and vomiting after your treatment, we encourage you to take your nausea medication as directed   If you develop nausea and vomiting that is not controlled by your nausea medication, call the clinic.   BELOW ARE SYMPTOMS THAT SHOULD BE REPORTED IMMEDIATELY:  *FEVER GREATER THAN 100.5 F  *CHILLS WITH OR WITHOUT FEVER  NAUSEA AND VOMITING THAT IS NOT CONTROLLED WITH YOUR NAUSEA MEDICATION  *UNUSUAL SHORTNESS OF BREATH  *UNUSUAL BRUISING OR BLEEDING  TENDERNESS IN MOUTH AND THROAT WITH OR WITHOUT PRESENCE OF ULCERS  *URINARY PROBLEMS  *BOWEL PROBLEMS  UNUSUAL RASH Items with * indicate a potential emergency and should be followed up as soon as possible.  Feel free to call the clinic should you have any questions or concerns. The clinic phone number is [phone].  Please show the CHEMO ALERT CARD at check-in to the Emergency Department and triage nurse.   

## 2019-04-17 ENCOUNTER — Telehealth: Payer: Self-pay

## 2019-04-17 LAB — CA 125: Cancer Antigen (CA) 125: 21.7 U/mL (ref 0.0–38.1)

## 2019-04-17 NOTE — Telephone Encounter (Signed)
Called and left below message. Ask her to call the office for questions. ?

## 2019-04-17 NOTE — Telephone Encounter (Signed)
-----   Message from Heath Lark, MD sent at 04/17/2019  7:08 AM EDT ----- Regarding: CA-125 is stable, let her know  ----- Message ----- From: Interface, [facility] [place] Sent: 04/16/2019  11:44 AM EDT To: Heath Lark, MD

## 2019-04-23 ENCOUNTER — Other Ambulatory Visit: Payer: Self-pay | Admitting: Hematology and Oncology

## 2019-04-25 ENCOUNTER — Encounter: Payer: Self-pay | Admitting: Hematology and Oncology

## 2019-04-26 ENCOUNTER — Other Ambulatory Visit: Payer: Self-pay | Admitting: Hematology and Oncology

## 2019-04-26 ENCOUNTER — Telehealth: Payer: Self-pay | Admitting: Hematology and Oncology

## 2019-04-26 DIAGNOSIS — L309 Dermatitis, unspecified: Secondary | ICD-10-CM | POA: Insufficient documentation

## 2019-04-26 MED ORDER — PREDNISONE 20 MG PO TABS: 40.0000 mg | ORAL_TABLET | Freq: Every day | ORAL | 0 refills | Status: DC

## 2019-04-26 NOTE — Telephone Encounter (Signed)
Scheduled appt per 6/5 sch message - unable to reach pt. Left message with appt date and time

## 2019-04-29 ENCOUNTER — Other Ambulatory Visit: Payer: Self-pay

## 2019-04-29 ENCOUNTER — Encounter: Payer: Self-pay | Admitting: Hematology and Oncology

## 2019-04-29 ENCOUNTER — Inpatient Hospital Stay: Payer: Medicare Other | Attending: Gynecology | Admitting: Hematology and Oncology

## 2019-04-29 DIAGNOSIS — B029 Zoster without complications: Secondary | ICD-10-CM | POA: Diagnosis not present

## 2019-04-29 DIAGNOSIS — Z5112 Encounter for antineoplastic immunotherapy: Secondary | ICD-10-CM | POA: Insufficient documentation

## 2019-04-29 DIAGNOSIS — L309 Dermatitis, unspecified: Secondary | ICD-10-CM | POA: Diagnosis not present

## 2019-04-29 DIAGNOSIS — C561 Malignant neoplasm of right ovary: Secondary | ICD-10-CM

## 2019-04-29 MED ORDER — HYDROCHLOROTHIAZIDE 12.5 MG PO TABS: 37.5000 mg | ORAL_TABLET | Freq: Every day | ORAL | 1 refills | Status: DC

## 2019-04-29 MED ORDER — VALACYCLOVIR HCL 1 G PO TABS: 1000.0000 mg | ORAL_TABLET | Freq: Three times a day (TID) | ORAL | 0 refills | Status: DC

## 2019-04-30 ENCOUNTER — Encounter: Payer: Self-pay | Admitting: Hematology and Oncology

## 2019-04-30 NOTE — Assessment & Plan Note (Signed)
She also have component of dermatitis related to side effects of bevacizumab She can stop prednisone therapy She can take Benadryl as needed We discussed the importance of avoidance of sun exposure

## 2019-04-30 NOTE — Assessment & Plan Note (Signed)
She had multifactorial rash She had T4-5 right dermatome distribution being affected by shingles I recommend Valtrex and stopped her prednisone therapy We discussed contact isolation I will see her again next week and if her rash resolved, she will resume treatment as prescribed

## 2019-04-30 NOTE — Assessment & Plan Note (Signed)
From the ovarian cancer standpoint, she tolerated recent treatment well Continue treatment as scheduled

## 2019-04-30 NOTE — Progress Notes (Signed)
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[facility]) From the ovarian cancer standpoint, she tolerated recent treatment well Continue treatment as scheduled  Shingles rash She had multifactorial rash She had T4-5 right dermatome distribution being affected by shingles I recommend Valtrex and stopped her prednisone therapy We discussed contact isolation I will see her again next week and if her rash resolved, she will resume treatment as prescribed  Dermatitis She also have component of dermatitis related to side effects of bevacizumab She can stop prednisone therapy She can take Benadryl as needed We discussed the importance of avoidance of sun exposure   No orders of the defined types were placed in this encounter.   INTERVAL HISTORY: Please see below for problem oriented charting. She is seen urgently due to new onset of skin rash She had her last bevacizumab treatments on May 26 By May 30, she noted rash with blister on the right side of her flank/chest wall area She also noted some sun sensitivity but did put on sunscreen when she went out She was started on prednisone therapy along with Benadryl on April 26, 2019 Her blister has resolved She denies neuropathic pain on her chest wall No recent fever or chills  SUMMARY OF ONCOLOGIC HISTORY: Oncology History   Neg genetics. 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 In a postmenopausal female, consider continued annual imaging followup with CT or MRI versus surgical evaluation.  Colonic  diverticulosis. No radiographic evidence of diverticulitis.  Cholelithiasis.  No radiographic evidence of cholecystitis.  Small hiatal hernia.    07/30/2015 Pathology Results    Outside pathology showed right ovary with high-grade serous carcinoma, 2 cm in maximum dimension.  The tumor involves serosal surface of the right ovary and adjacent right fallopian tube.  Cervix and endometrium were within normal limits. ER positive Peritoneal washing was positive.    07/30/2015 Surgery    SHe underwent laparoscopic-assisted total vaginal hysterectomy, bilateral salpingo-oophorectomy    07/30/2015 Pathology Results    PERITONEAL WASHING PELVIC (SPECIMEN 1 OF 1, COLLECTED ON 07/30/2015): MALIGNANT CELLS CONSISTENT WITH HIGH GRADE CARCINOMA    08/13/2015 Tumor Marker    Patient's tumor was tested for the following markers: CA-125 Results of the tumor marker test revealed 96    08/25/2015 - 12/08/2015 Chemotherapy    The patient had 6 cycles of carboplatin and taxol    09/07/2015 Tumor Marker    Patient's tumor was tested for the following markers: CA-125 Results of the tumor marker test revealed 51    10/05/2015 Tumor Marker    Patient's tumor was tested for the following markers: CA-125 Results of the tumor marker test revealed 29    11/09/2015 Tumor Marker    Patient's tumor was tested for the following markers: CA-125 Results of the tumor marker test revealed 44    12/08/2015 Tumor Marker    Patient's tumor was tested for the following markers: CA-125 Results of the tumor marker test revealed 30    12/15/2015 Genetic Testing    Genetics testing normal by  [facility] Breast Ovarian panel 12-15-15    01/11/2016 Imaging    1. The only finding of note are several adjacent peripheral abnormal hypodensities along the anterior superior spleen margin, differential diagnostic considerations including small peripheral interval splenic infarcts, splenic injury with subcapsular a fluid collections, or less  likely metastatic disease to the splenic margin. This likely merits observation. 2. Other imaging findings of potential clinical significance: Small type 1 hiatal hernia. Aortoiliac atherosclerotic vascular disease. Lower lumbar spondylosis and degenerative disc disease. Gallstones with potential mild gallbladder wall thickening.    03/07/2016 Tumor Marker    Patient's tumor was tested for the following markers: CA-125 Results of the tumor marker test revealed 24.2    04/06/2016 Imaging    1. No evidence of a ovarian cancer metastasis. 2. No ascites. 3. Post hysterectomy and oophorectomy. 4. Cholelithiasis with several large gallstones    04/06/2016 Tumor Marker    Patient's tumor was tested for the following markers: CA-125 Results of the tumor marker test revealed 22.8    05/30/2016 Tumor Marker    Patient's tumor was tested for the following markers: CA-125 Results of the tumor marker test revealed 21    09/07/2016 Tumor Marker    Patient's tumor was tested for the following markers: CA-125 Results of the tumor marker test revealed 22.4    11/28/2016 Tumor Marker    Patient's tumor was tested for the following markers: CA-125 Results of the tumor marker test revealed 18.8    02/23/2017 Tumor Marker    Patient's tumor was tested for the following markers: CA-125 Results of the tumor marker test revealed 19.1    06/02/2017 Tumor Marker    Patient's tumor was tested for the following markers: CA-125 Results of the tumor marker test revealed 18.3    08/24/2017 Mammogram    Pt reports mammogram complete    08/28/2017 Tumor Marker    Patient's tumor was tested for the following markers: CA-125 Results of the tumor marker test revealed 21.1    12/01/2017 Tumor Marker    Patient's tumor was tested for the following markers: CA-125 Results of the tumor marker test revealed 31.2    12/13/2017 Imaging    New 2.7 cm peritoneal soft tissue mass in anterior left lower quadrant, consistent  with metastatic disease.  No other sites of metastatic disease identified.  Incidental findings including: Cholelithiasis. Colonic diverticulosis. Tiny hiatal hernia. Aortic atherosclerosis.    01/09/2018 - 05/07/2018 Chemotherapy    The patient had carboplatin and taxol; After 2nd cycle, she developed carboplatin allergy. She received carboplatin desensitization protocol at [facility] for final 4 cycles, completed by 6/17. Avastin was added from 04/16/18 onwards    01/30/2018 Adverse Reaction    Potential carboplatin allergy is suspected. She received half the dose of prescribed carboplatin on cycle 2    05/31/2018 Tumor Marker    Patient's tumor was tested for the following markers: CA-125 Results of the tumor marker test revealed 26    05/31/2018 Imaging    1. Peritoneal soft tissue nodule identified on the previous study has decreased in the interval the. No progressive findings in the abdomen or pelvis on today's study to suggest disease progression. 2. Tiny pulmonary nodules, likely benign. Attention on follow-up recommended. 3. Cholelithiasis. 4.  Aortic Atherosclerois (ICD10-170.0) 5. Tiny hiatal hernia.      06/04/2018 -  Chemotherapy    The patient is placed on Avastin for maintenance    06/25/2018 Tumor Marker    Patient's tumor was tested  for the following markers: CA-125 Results of the tumor marker test revealed 22.5    08/06/2018 Tumor Marker    Patient's tumor was tested for the following markers: CA-125 Results of the tumor marker test revealed 20.7    09/14/2018 Imaging    Status post hysterectomy and bilateral salpingo-oophorectomy.  Stable soft tissue nodule beneath the left lower anterior abdominal wall, likely reflecting stable peritoneal disease.  Scattered small subpleural nodules in the lungs bilaterally, measuring up to 3 mm, technically indeterminate although likely benign. Please note that Fleischner Society guidelines do not apply. Attention on follow-up is  suggested.  No evidence of new/progressive metastatic disease.    09/14/2018 Tumor Marker    Patient's tumor was tested for the following markers: CA-125 Results of the tumor marker test revealed 21.3    10/30/2018 Tumor Marker    Patient's tumor was tested for the following markers: CA-125 Results of the tumor marker test revealed 20.8    12/12/2018 Tumor Marker    Patient's tumor was tested for the following markers: CA-125 Results of the tumor marker test revealed 19.7    01/01/2019 Tumor Marker    Patient's tumor was tested for the following markers: CA-125 Results of the tumor marker test revealed 21.6    01/22/2019 Tumor Marker    Patient's tumor was tested for the following markers: CA-125 Results of the tumor marker test revealed 21.3    02/12/2019 Tumor Marker    Patient's tumor was tested for the following markers: CA-125 Results of the tumor marker test revealed 21.6    04/16/2019 Tumor Marker    Patient's tumor was tested for the following markers: CA-125 Results of the tumor marker test revealed 21.7     REVIEW OF SYSTEMS:   Constitutional: Denies fevers, chills or abnormal weight loss Eyes: Denies blurriness of vision Ears, nose, mouth, throat, and face: Denies mucositis or sore throat Respiratory: Denies cough, dyspnea or wheezes Cardiovascular: Denies palpitation, chest discomfort or lower extremity swelling Gastrointestinal:  Denies nausea, heartburn or change in bowel habits Lymphatics: Denies new lymphadenopathy or easy bruising Neurological:Denies numbness, tingling or new weaknesses Behavioral/Psych: Mood is stable, no new changes  All other systems were reviewed with the patient and are negative.  I have reviewed the past medical history, past surgical history, social history and family history with the patient and they are unchanged from previous note.  ALLERGIES:  is allergic to carboplatin.  MEDICATIONS:  Current Outpatient Medications   Medication Sig Dispense Refill  . amLODipine (NORVASC) 10 MG tablet Take 1 tablet (10 mg total) by mouth daily. 90 tablet 3  . atenolol (TENORMIN) 25 MG tablet TAKE ONE TABLET BY MOUTH DAILY 30 tablet 9  . atorvastatin (LIPITOR) 10 MG tablet Take 1 tablet (10 mg total) by mouth daily. 30 tablet 2  . Coenzyme Q10 (CO Q 10) 100 MG CAPS Take 1 capsule by mouth daily.     . Glucosamine-Chondroit-Vit C-Mn (GLUCOSAMINE 1500 COMPLEX PO) Take 1 capsule by mouth 2 (two) times daily.    . hydrochlorothiazide (HYDRODIURIL) 12.5 MG tablet Take 3 tablets (37.5 mg total) by mouth daily. 90 tablet 1  . lidocaine-prilocaine (EMLA) cream Apply to affected area once 30 g 3  . lisinopril (PRINIVIL,ZESTRIL) 30 MG tablet Take 1 tablet (30 mg total) by mouth daily. 30 tablet 11  . loratadine (CLARITIN) 10 MG tablet Take 10 mg by mouth daily as needed for allergies (hives).    . Multiple Vitamin (MULTIVITAMIN) capsule Take 1 capsule  by mouth daily.     . Omega-3 Fatty Acids (OMEGA-3 FISH OIL) 300 MG CAPS Take 1 capsule by mouth daily.     . ondansetron (ZOFRAN) 8 MG tablet Take 1 tablet (8 mg total) by mouth 2 (two) times daily as needed for refractory nausea / vomiting. Start on day 3 after chemo. 30 tablet 1  . Polyethyl Glycol-Propyl Glycol (SYSTANE ULTRA OP) Apply 1 drop to eye 2 (two) times daily at 10 AM and 5 PM.    . Probiotic Product (PROBIOTIC ADVANCED PO) Take 1 capsule by mouth daily.    . prochlorperazine (COMPAZINE) 10 MG tablet Take 1 tablet (10 mg total) by mouth every 6 (six) hours as needed (Nausea or vomiting). 60 tablet 1  . valACYclovir (VALTREX) 1000 MG tablet Take 1 tablet (1,000 mg total) by mouth 3 (three) times daily. 21 tablet 0  . vitamin E 400 UNIT capsule Take 400 Units by mouth every evening.      No current facility-administered medications for this visit.     PHYSICAL EXAMINATION: ECOG PERFORMANCE STATUS: 1 - Symptomatic but completely ambulatory  Vitals:   04/29/19 1020  BP:  (!) 146/56  Pulse: 61  Resp: 18  Temp: 98.3 F (36.8 C)  SpO2: 100%   Filed Weights   04/29/19 1020  Weight: 137 lb 12.8 oz (62.5 kg)    GENERAL:alert, no distress and comfortable SKIN: She had 2 different kind of rash.  The rash on her hands are most consistent with dermatitis due to bevacizumab/sun exposure.  The separate rash on her right chest wall is consistent with shingles Musculoskeletal:no cyanosis of digits and no clubbing  NEURO: alert & oriented x 3 with fluent speech, no focal motor/sensory deficits  LABORATORY DATA:  I have reviewed the data as listed    Component Value Date/Time   NA 140 04/16/2019 1125   NA 142 11/28/2016 0916   K 4.0 04/16/2019 1125   K 3.9 11/28/2016 0916   CL 109 04/16/2019 1125   CO2 24 04/16/2019 1125   CO2 28 11/28/2016 0916   GLUCOSE 102 (H) 04/16/2019 1125   GLUCOSE 78 11/28/2016 0916   BUN 30 (H) 04/16/2019 1125   BUN 16.8 11/28/2016 0916   CREATININE 1.03 (H) 04/16/2019 1125   CREATININE 1.02 (H) 03/05/2019 1230   CREATININE 0.8 11/28/2016 0916   CALCIUM 9.2 04/16/2019 1125   CALCIUM 10.5 (H) 11/28/2016 0916   PROT 6.7 04/16/2019 1125   PROT 7.3 11/28/2016 0916   ALBUMIN 3.7 04/16/2019 1125   ALBUMIN 4.3 11/28/2016 0916   AST 30 04/16/2019 1125   AST 22 03/05/2019 1230   AST 19 11/28/2016 0916   ALT 19 04/16/2019 1125   ALT 16 03/05/2019 1230   ALT 19 11/28/2016 0916   ALKPHOS 48 04/16/2019 1125   ALKPHOS 50 11/28/2016 0916   BILITOT 0.2 (L) 04/16/2019 1125   BILITOT 0.3 03/05/2019 1230   BILITOT 0.48 11/28/2016 0916   GFRNONAA 52 (L) 04/16/2019 1125   GFRNONAA 53 (L) 03/05/2019 1230   GFRAA >60 04/16/2019 1125   GFRAA >60 03/05/2019 1230    No results found for: SPEP, UPEP  Lab Results  Component Value Date   WBC 3.9 (L) 04/16/2019   NEUTROABS 2.0 04/16/2019   HGB 11.9 (L) 04/16/2019   HCT 37.0 04/16/2019   MCV 93.7 04/16/2019   PLT 111 (L) 04/16/2019      Chemistry      Component Value Date/Time   NA  140 04/16/2019 1125   NA 142 11/28/2016 0916   K 4.0 04/16/2019 1125   K 3.9 11/28/2016 0916   CL 109 04/16/2019 1125   CO2 24 04/16/2019 1125   CO2 28 11/28/2016 0916   BUN 30 (H) 04/16/2019 1125   BUN 16.8 11/28/2016 0916   CREATININE 1.03 (H) 04/16/2019 1125   CREATININE 1.02 (H) 03/05/2019 1230   CREATININE 0.8 11/28/2016 0916      Component Value Date/Time   CALCIUM 9.2 04/16/2019 1125   CALCIUM 10.5 (H) 11/28/2016 0916   ALKPHOS 48 04/16/2019 1125   ALKPHOS 50 11/28/2016 0916   AST 30 04/16/2019 1125   AST 22 03/05/2019 1230   AST 19 11/28/2016 0916   ALT 19 04/16/2019 1125   ALT 16 03/05/2019 1230   ALT 19 11/28/2016 0916   BILITOT 0.2 (L) 04/16/2019 1125   BILITOT 0.3 03/05/2019 1230   BILITOT 0.48 11/28/2016 0916       All questions were answered. The patient knows to call the clinic with any problems, questions or concerns. No barriers to learning was detected.  I spent 15 minutes counseling the patient face to face. The total time spent in the appointment was 20 minutes and more than 50% was on counseling and review of test results  Heath Lark, MD 04/30/2019 7:52 AM

## 2019-05-01 ENCOUNTER — Encounter: Payer: Self-pay | Admitting: Hematology and Oncology

## 2019-05-02 ENCOUNTER — Other Ambulatory Visit: Payer: Self-pay | Admitting: Hematology and Oncology

## 2019-05-07 ENCOUNTER — Inpatient Hospital Stay: Payer: Medicare Other

## 2019-05-07 ENCOUNTER — Encounter: Payer: Self-pay | Admitting: Hematology and Oncology

## 2019-05-07 ENCOUNTER — Inpatient Hospital Stay (HOSPITAL_BASED_OUTPATIENT_CLINIC_OR_DEPARTMENT_OTHER): Payer: Medicare Other | Admitting: Hematology and Oncology

## 2019-05-07 ENCOUNTER — Other Ambulatory Visit: Payer: Self-pay

## 2019-05-07 VITALS — BP 118/50 | HR 50

## 2019-05-07 DIAGNOSIS — C561 Malignant neoplasm of right ovary: Secondary | ICD-10-CM

## 2019-05-07 DIAGNOSIS — B029 Zoster without complications: Secondary | ICD-10-CM

## 2019-05-07 DIAGNOSIS — I10 Essential (primary) hypertension: Secondary | ICD-10-CM | POA: Diagnosis not present

## 2019-05-07 DIAGNOSIS — Z7189 Other specified counseling: Secondary | ICD-10-CM

## 2019-05-07 DIAGNOSIS — Z5112 Encounter for antineoplastic immunotherapy: Secondary | ICD-10-CM | POA: Diagnosis not present

## 2019-05-07 DIAGNOSIS — D61818 Other pancytopenia: Secondary | ICD-10-CM

## 2019-05-07 DIAGNOSIS — D696 Thrombocytopenia, unspecified: Secondary | ICD-10-CM

## 2019-05-07 LAB — CBC WITH DIFFERENTIAL/PLATELET
Abs Immature Granulocytes: 0.01 10*3/uL (ref 0.00–0.07)
Basophils Absolute: 0.1 10*3/uL (ref 0.0–0.1)
Basophils Relative: 1 %
Eosinophils Absolute: 0.1 10*3/uL (ref 0.0–0.5)
Eosinophils Relative: 2 %
HCT: 38.7 % (ref 36.0–46.0)
Hemoglobin: 12.8 g/dL (ref 12.0–15.0)
Immature Granulocytes: 0 %
Lymphocytes Relative: 30 %
Lymphs Abs: 1.5 10*3/uL (ref 0.7–4.0)
MCH: 30.5 pg (ref 26.0–34.0)
MCHC: 33.1 g/dL (ref 30.0–36.0)
MCV: 92.4 fL (ref 80.0–100.0)
Monocytes Absolute: 0.6 10*3/uL (ref 0.1–1.0)
Monocytes Relative: 12 %
Neutro Abs: 2.7 10*3/uL (ref 1.7–7.7)
Neutrophils Relative %: 55 %
Platelets: 130 10*3/uL — ABNORMAL LOW (ref 150–400)
RBC: 4.19 MIL/uL (ref 3.87–5.11)
RDW: 13.6 % (ref 11.5–15.5)
WBC: 5 10*3/uL (ref 4.0–10.5)
nRBC: 0 % (ref 0.0–0.2)

## 2019-05-07 LAB — COMPREHENSIVE METABOLIC PANEL
ALT: 20 U/L (ref 0–44)
AST: 25 U/L (ref 15–41)
Albumin: 3.5 g/dL (ref 3.5–5.0)
Alkaline Phosphatase: 45 U/L (ref 38–126)
Anion gap: 9 (ref 5–15)
BUN: 26 mg/dL — ABNORMAL HIGH (ref 8–23)
CO2: 22 mmol/L (ref 22–32)
Calcium: 8.9 mg/dL (ref 8.9–10.3)
Chloride: 108 mmol/L (ref 98–111)
Creatinine, Ser: 1.04 mg/dL — ABNORMAL HIGH (ref 0.44–1.00)
GFR calc Af Amer: >60 mL/min (ref >60)
GFR calc non Af Amer: 52 mL/min — ABNORMAL LOW (ref >60)
Glucose, Bld: 88 mg/dL (ref 70–99)
Potassium: 4.2 mmol/L (ref 3.5–5.1)
Sodium: 139 mmol/L (ref 135–145)
Total Bilirubin: 0.3 mg/dL (ref 0.3–1.2)
Total Protein: 6.7 g/dL (ref 6.5–8.1)

## 2019-05-07 LAB — TOTAL PROTEIN, URINE DIPSTICK: Protein, ur: <30 mg/dL — AB

## 2019-05-07 MED ORDER — SODIUM CHLORIDE 0.9 % IV SOLN
Freq: Once | INTRAVENOUS | Status: AC
  Administered 2019-05-07: 13:00:00 via INTRAVENOUS
  Filled 2019-05-07: qty 250

## 2019-05-07 MED ORDER — HEPARIN SOD (PORK) LOCK FLUSH 100 UNIT/ML IV SOLN
500.0000 [IU] | Freq: Once | INTRAVENOUS | Status: AC | PRN
  Administered 2019-05-07: 500 [IU]
  Filled 2019-05-07: qty 5

## 2019-05-07 MED ORDER — SODIUM CHLORIDE 0.9% FLUSH
10.0000 mL | INTRAVENOUS | Status: DC | PRN
  Administered 2019-05-07: 10 mL
  Filled 2019-05-07: qty 10

## 2019-05-07 MED ORDER — SODIUM CHLORIDE 0.9% FLUSH
10.0000 mL | Freq: Once | INTRAVENOUS | Status: AC
  Administered 2019-05-07: 10 mL
  Filled 2019-05-07: qty 10

## 2019-05-07 MED ORDER — SODIUM CHLORIDE 0.9 % IV SOLN
15.0000 mg/kg | Freq: Once | INTRAVENOUS | Status: AC
  Administered 2019-05-07: 1000 mg via INTRAVENOUS
  Filled 2019-05-07: qty 32

## 2019-05-07 NOTE — Assessment & Plan Note (Signed)
Her rash is almost completely resolved She is at the healing phase She will continue topical emollient cream only She has completed antiviral treatment

## 2019-05-07 NOTE — Assessment & Plan Note (Signed)
Her pancytopenia is gradually improving with time away from treatment.  Observe only She has mild chronic thrombocytopenia but not symptomatic

## 2019-05-07 NOTE — Assessment & Plan Note (Signed)
From the ovarian cancer standpoint, she tolerated recent treatment well Continue treatment as scheduled Tumor marker is pending We will call her tomorrow with test results In the absence of rising tumor markers or symptoms, I do not plan routine imaging study

## 2019-05-07 NOTE — Progress Notes (Signed)
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[facility]) From the ovarian cancer standpoint, she tolerated recent treatment well Continue treatment as scheduled Tumor marker is pending We will call her tomorrow with test results In the absence of rising tumor markers or symptoms, I do not plan routine imaging study  Essential hypertension Her blood pressure is satisfactory She has no signs of proteinuria She will continue her current prescriptions of blood pressure medicines  Shingles rash Her rash is almost completely resolved She is at the healing phase She will continue topical emollient cream only She has completed antiviral treatment  Thrombocytopenia (HCC) Her pancytopenia is gradually improving with time away from treatment.  Observe only She has mild chronic thrombocytopenia but not symptomatic   No orders of the defined types were placed in this encounter.   INTERVAL HISTORY: Please see below for problem oriented charting. She returns for further follow-up Her rash is almost completely resolved She has minimum skin itching No recent fever or chills The patient denies any recent signs or symptoms of bleeding such as spontaneous epistaxis, hematuria or hematochezia. She denies symptoms of bloating or abdominal pain or changes in bowel habits  SUMMARY OF ONCOLOGIC HISTORY: Oncology History Overview Note  Neg genetics. Additional tests revealed ER: 80%, PR 3%    Right ovarian epithelial cancer ([place])  07/01/2015 Initial Diagnosis   She presented with postmenopausal bleeding   07/02/2015 Imaging   US pelvis showed bilateral ovarian cyst   07/13/2015 Imaging   5.2 cm left adnexal cystic lesion, with indeterminate but probably benign characteristics. In a postmenopausal female, consider continued annual imaging followup with CT or MRI versus  surgical evaluation.  Colonic diverticulosis. No radiographic evidence of diverticulitis.  Cholelithiasis.  No radiographic evidence of cholecystitis.  Small hiatal hernia.   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  The tumor involves serosal surface of the right ovary and adjacent right fallopian tube.  Cervix and endometrium were within normal limits. ER positive Peritoneal washing was positive.   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   08/13/2015 Tumor Marker   Patient's tumor was tested for the following markers: CA-125 Results of the tumor marker test revealed 96   08/25/2015 - 12/08/2015 Chemotherapy   The patient had 6 cycles of carboplatin and taxol   09/07/2015 Tumor Marker   Patient's tumor was tested for the following markers: CA-125 Results of the tumor marker test revealed 51   10/05/2015 Tumor Marker   Patient's tumor was tested for the following markers: CA-125 Results of the tumor marker test revealed 29   11/09/2015 Tumor Marker   Patient's tumor was tested for the following markers: CA-125 Results of the tumor marker test revealed 44   12/08/2015 Tumor Marker   Patient's tumor was tested for the following markers: CA-125 Results of the tumor marker test revealed 30   12/15/2015 Genetic Testing   Genetics testing normal by [facility] Breast Ovarian panel 12-15-15   01/11/2016 Imaging   1. The only finding of note are several adjacent peripheral abnormal hypodensities along the anterior superior spleen margin, differential diagnostic considerations including small peripheral interval  splenic infarcts, splenic injury with subcapsular a fluid collections,  or less likely metastatic disease to the splenic margin. This likely merits observation. 2. Other imaging findings of potential clinical significance: Small type 1 hiatal hernia. Aortoiliac atherosclerotic vascular disease. Lower lumbar spondylosis and degenerative disc disease. Gallstones with potential mild gallbladder wall thickening.   03/07/2016 Tumor Marker   Patient's tumor was tested for the following markers: CA-125 Results of the tumor marker test revealed 24.2   04/06/2016 Imaging   1. No evidence of a ovarian cancer metastasis. 2. No ascites. 3. Post hysterectomy and oophorectomy. 4. Cholelithiasis with several large gallstones   04/06/2016 Tumor Marker   Patient's tumor was tested for the following markers: CA-125 Results of the tumor marker test revealed 22.8   05/30/2016 Tumor Marker   Patient's tumor was tested for the following markers: CA-125 Results of the tumor marker test revealed 21   09/07/2016 Tumor Marker   Patient's tumor was tested for the following markers: CA-125 Results of the tumor marker test revealed 22.4   11/28/2016 Tumor Marker   Patient's tumor was tested for the following markers: CA-125 Results of the tumor marker test revealed 18.8   02/23/2017 Tumor Marker   Patient's tumor was tested for the following markers: CA-125 Results of the tumor marker test revealed 19.1   06/02/2017 Tumor Marker   Patient's tumor was tested for the following markers: CA-125 Results of the tumor marker test revealed 18.3   08/24/2017 Mammogram   Pt reports mammogram complete   08/28/2017 Tumor Marker   Patient's tumor was tested for the following markers: CA-125 Results of the tumor marker test revealed 21.1   12/01/2017 Tumor Marker   Patient's tumor was tested for the following markers: CA-125 Results of the tumor marker test revealed 31.2   12/13/2017 Imaging   New 2.7 cm peritoneal soft tissue mass in anterior left lower quadrant, consistent with metastatic  disease.  No other sites of metastatic disease identified.  Incidental findings including: Cholelithiasis. Colonic diverticulosis. Tiny hiatal hernia. Aortic atherosclerosis.   01/09/2018 - 05/07/2018 Chemotherapy   The patient had carboplatin and taxol; After 2nd cycle, she developed carboplatin allergy. She received carboplatin desensitization protocol at [facility] for final 4 cycles, completed by 6/17. Avastin was added from 04/16/18 onwards   01/30/2018 Adverse Reaction   Potential carboplatin allergy is suspected. She received half the dose of prescribed carboplatin on cycle 2   05/31/2018 Tumor Marker   Patient's tumor was tested for the following markers: CA-125 Results of the tumor marker test revealed 26   05/31/2018 Imaging   1. Peritoneal soft tissue nodule identified on the previous study has decreased in the interval the. No progressive findings in the abdomen or pelvis on today's study to suggest disease progression. 2. Tiny pulmonary nodules, likely benign. Attention on follow-up recommended. 3. Cholelithiasis. 4.  Aortic Atherosclerois (ICD10-170.0) 5. Tiny hiatal hernia.     06/04/2018 -  Chemotherapy   The patient is placed on Avastin for maintenance   06/25/2018 Tumor Marker   Patient's tumor was tested for the following markers: CA-125 Results of the tumor marker test revealed 22.5   08/06/2018 Tumor Marker   Patient's tumor was tested for the following markers: CA-125 Results of the tumor marker test revealed 20.7   09/14/2018 Imaging   Status post hysterectomy and bilateral salpingo-oophorectomy.  Stable soft tissue nodule beneath the left lower anterior abdominal wall, likely reflecting stable peritoneal disease.  Scattered small subpleural nodules in the lungs  bilaterally, measuring up to 3 mm, technically indeterminate although likely benign. Please note that Fleischner Society guidelines do not apply. Attention on follow-up is suggested.  No evidence of  new/progressive metastatic disease.   09/14/2018 Tumor Marker   Patient's tumor was tested for the following markers: CA-125 Results of the tumor marker test revealed 21.3   10/30/2018 Tumor Marker   Patient's tumor was tested for the following markers: CA-125 Results of the tumor marker test revealed 20.8   12/12/2018 Tumor Marker   Patient's tumor was tested for the following markers: CA-125 Results of the tumor marker test revealed 19.7   01/01/2019 Tumor Marker   Patient's tumor was tested for the following markers: CA-125 Results of the tumor marker test revealed 21.6   01/22/2019 Tumor Marker   Patient's tumor was tested for the following markers: CA-125 Results of the tumor marker test revealed 21.3   02/12/2019 Tumor Marker   Patient's tumor was tested for the following markers: CA-125 Results of the tumor marker test revealed 21.6   04/16/2019 Tumor Marker   Patient's tumor was tested for the following markers: CA-125 Results of the tumor marker test revealed 21.7     REVIEW OF SYSTEMS:   Constitutional: Denies fevers, chills or abnormal weight loss Eyes: Denies blurriness of vision Ears, nose, mouth, throat, and face: Denies mucositis or sore throat Respiratory: Denies cough, dyspnea or wheezes Cardiovascular: Denies palpitation, chest discomfort or lower extremity swelling Gastrointestinal:  Denies nausea, heartburn or change in bowel habits Lymphatics: Denies new lymphadenopathy or easy bruising Neurological:Denies numbness, tingling or new weaknesses Behavioral/Psych: Mood is stable, no new changes  All other systems were reviewed with the patient and are negative.  I have reviewed the past medical history, past surgical history, social history and family history with the patient and they are unchanged from previous note.  ALLERGIES:  is allergic to carboplatin.  MEDICATIONS:  Current Outpatient Medications  Medication Sig Dispense Refill  . amLODipine  (NORVASC) 10 MG tablet Take 1 tablet (10 mg total) by mouth daily. 90 tablet 3  . atenolol (TENORMIN) 25 MG tablet TAKE ONE TABLET BY MOUTH DAILY 30 tablet 9  . atorvastatin (LIPITOR) 10 MG tablet Take 1 tablet (10 mg total) by mouth daily. 30 tablet 2  . Coenzyme Q10 (CO Q 10) 100 MG CAPS Take 1 capsule by mouth daily.     . Glucosamine-Chondroit-Vit C-Mn (GLUCOSAMINE 1500 COMPLEX PO) Take 1 capsule by mouth 2 (two) times daily.    . hydrochlorothiazide (HYDRODIURIL) 12.5 MG tablet Take 3 tablets (37.5 mg total) by mouth daily. 90 tablet 1  . lidocaine-prilocaine (EMLA) cream Apply to affected area once 30 g 3  . lisinopril (PRINIVIL,ZESTRIL) 30 MG tablet Take 1 tablet (30 mg total) by mouth daily. 30 tablet 11  . loratadine (CLARITIN) 10 MG tablet Take 10 mg by mouth daily as needed for allergies (hives).    . Multiple Vitamin (MULTIVITAMIN) capsule Take 1 capsule by mouth daily.     . Omega-3 Fatty Acids (OMEGA-3 FISH OIL) 300 MG CAPS Take 1 capsule by mouth daily.     . ondansetron (ZOFRAN) 8 MG tablet Take 1 tablet (8 mg total) by mouth 2 (two) times daily as needed for refractory nausea / vomiting. Start on day 3 after chemo. 30 tablet 1  . Polyethyl Glycol-Propyl Glycol (SYSTANE ULTRA OP) Apply 1 drop to eye 2 (two) times daily at 10 AM and 5 PM.    . Probiotic Product (PROBIOTIC ADVANCED  PO) Take 1 capsule by mouth daily.    . prochlorperazine (COMPAZINE) 10 MG tablet Take 1 tablet (10 mg total) by mouth every 6 (six) hours as needed (Nausea or vomiting). 60 tablet 1  . vitamin E 400 UNIT capsule Take 400 Units by mouth every evening.      No current facility-administered medications for this visit.    Facility-Administered Medications Ordered in Other Visits  Medication Dose Route Frequency Provider Last Rate Last Dose  . bevacizumab (AVASTIN) 1,000 mg in sodium chloride 0.9 % 100 mL chemo infusion  15 mg/kg (Treatment Plan Recorded) Intravenous Once Alvy Bimler, Arwa Yero, MD      . heparin lock  flush 100 unit/mL  500 Units Intracatheter Once PRN Alvy Bimler, Quincie Haroon, MD      . sodium chloride flush (NS) 0.9 % injection 10 mL  10 mL Intracatheter PRN Alvy Bimler, Sunshine Mackowski, MD        PHYSICAL EXAMINATION: ECOG PERFORMANCE STATUS: 1 - Symptomatic but completely ambulatory  Vitals:   05/07/19 1235  BP: (!) 139/56  Pulse: (!) 58  Resp: 18  Temp: (!) 97.1 F (36.2 C)  SpO2: 100%   Filed Weights   05/07/19 1235  Weight: 140 lb 3.2 oz (63.6 kg)    GENERAL:alert, no distress and comfortable SKIN: Her rash is healing well. EYES: normal, Conjunctiva are pink and non-injected, sclera clear OROPHARYNX:no exudate, no erythema and lips, buccal mucosa, and tongue normal  NECK: supple, thyroid normal size, non-tender, without nodularity LYMPH:  no palpable lymphadenopathy in the cervical, axillary or inguinal LUNGS: clear to auscultation and percussion with normal breathing effort HEART: regular rate & rhythm and no murmurs and no lower extremity edema ABDOMEN:abdomen soft, non-tender and normal bowel sounds Musculoskeletal:no cyanosis of digits and no clubbing  NEURO: alert & oriented x 3 with fluent speech, no focal motor/sensory deficits  LABORATORY DATA:  I have reviewed the data as listed    Component Value Date/Time   NA 139 05/07/2019 1147   NA 142 11/28/2016 0916   K 4.2 05/07/2019 1147   K 3.9 11/28/2016 0916   CL 108 05/07/2019 1147   CO2 22 05/07/2019 1147   CO2 28 11/28/2016 0916   GLUCOSE 88 05/07/2019 1147   GLUCOSE 78 11/28/2016 0916   BUN 26 (H) 05/07/2019 1147   BUN 16.8 11/28/2016 0916   CREATININE 1.04 (H) 05/07/2019 1147   CREATININE 1.02 (H) 03/05/2019 1230   CREATININE 0.8 11/28/2016 0916   CALCIUM 8.9 05/07/2019 1147   CALCIUM 10.5 (H) 11/28/2016 0916   PROT 6.7 05/07/2019 1147   PROT 7.3 11/28/2016 0916   ALBUMIN 3.5 05/07/2019 1147   ALBUMIN 4.3 11/28/2016 0916   AST 25 05/07/2019 1147   AST 22 03/05/2019 1230   AST 19 11/28/2016 0916   ALT 20 05/07/2019  1147   ALT 16 03/05/2019 1230   ALT 19 11/28/2016 0916   ALKPHOS 45 05/07/2019 1147   ALKPHOS 50 11/28/2016 0916   BILITOT 0.3 05/07/2019 1147   BILITOT 0.3 03/05/2019 1230   BILITOT 0.48 11/28/2016 0916   GFRNONAA 52 (L) 05/07/2019 1147   GFRNONAA 53 (L) 03/05/2019 1230   GFRAA >60 05/07/2019 1147   GFRAA >60 03/05/2019 1230    No results found for: SPEP, UPEP  Lab Results  Component Value Date   WBC 5.0 05/07/2019   NEUTROABS 2.7 05/07/2019   HGB 12.8 05/07/2019   HCT 38.7 05/07/2019   MCV 92.4 05/07/2019   PLT 130 (L) 05/07/2019  Chemistry      Component Value Date/Time   NA 139 05/07/2019 1147   NA 142 11/28/2016 0916   K 4.2 05/07/2019 1147   K 3.9 11/28/2016 0916   CL 108 05/07/2019 1147   CO2 22 05/07/2019 1147   CO2 28 11/28/2016 0916   BUN 26 (H) 05/07/2019 1147   BUN 16.8 11/28/2016 0916   CREATININE 1.04 (H) 05/07/2019 1147   CREATININE 1.02 (H) 03/05/2019 1230   CREATININE 0.8 11/28/2016 0916      Component Value Date/Time   CALCIUM 8.9 05/07/2019 1147   CALCIUM 10.5 (H) 11/28/2016 0916   ALKPHOS 45 05/07/2019 1147   ALKPHOS 50 11/28/2016 0916   AST 25 05/07/2019 1147   AST 22 03/05/2019 1230   AST 19 11/28/2016 0916   ALT 20 05/07/2019 1147   ALT 16 03/05/2019 1230   ALT 19 11/28/2016 0916   BILITOT 0.3 05/07/2019 1147   BILITOT 0.3 03/05/2019 1230   BILITOT 0.48 11/28/2016 0916       All questions were answered. The patient knows to call the clinic with any problems, questions or concerns. No barriers to learning was detected.  I spent 15 minutes counseling the patient face to face. The total time spent in the appointment was 20 minutes and more than 50% was on counseling and review of test results  Heath Lark, MD 05/07/2019 1:13 PM

## 2019-05-07 NOTE — Assessment & Plan Note (Signed)
Her blood pressure is satisfactory She has no signs of proteinuria She will continue her current prescriptions of blood pressure medicines

## 2019-05-08 LAB — CA 125: Cancer Antigen (CA) 125: 27.3 U/mL (ref 0.0–38.1)

## 2019-05-28 ENCOUNTER — Other Ambulatory Visit: Payer: Self-pay

## 2019-05-28 ENCOUNTER — Inpatient Hospital Stay: Payer: Medicare Other | Attending: Gynecology

## 2019-05-28 ENCOUNTER — Inpatient Hospital Stay: Payer: Medicare Other

## 2019-05-28 VITALS — BP 131/58 | HR 52 | Temp 98.9°F | Resp 17

## 2019-05-28 DIAGNOSIS — C561 Malignant neoplasm of right ovary: Secondary | ICD-10-CM

## 2019-05-28 DIAGNOSIS — Z5112 Encounter for antineoplastic immunotherapy: Secondary | ICD-10-CM | POA: Diagnosis present

## 2019-05-28 DIAGNOSIS — Z7189 Other specified counseling: Secondary | ICD-10-CM

## 2019-05-28 LAB — CBC WITH DIFFERENTIAL/PLATELET
Abs Immature Granulocytes: 0 10*3/uL (ref 0.00–0.07)
Basophils Absolute: 0.1 10*3/uL (ref 0.0–0.1)
Basophils Relative: 2 %
Eosinophils Absolute: 0.2 10*3/uL (ref 0.0–0.5)
Eosinophils Relative: 4 %
HCT: 37.9 % (ref 36.0–46.0)
Hemoglobin: 12.4 g/dL (ref 12.0–15.0)
Immature Granulocytes: 0 %
Lymphocytes Relative: 36 %
Lymphs Abs: 1.3 10*3/uL (ref 0.7–4.0)
MCH: 30.5 pg (ref 26.0–34.0)
MCHC: 32.7 g/dL (ref 30.0–36.0)
MCV: 93.3 fL (ref 80.0–100.0)
Monocytes Absolute: 0.5 10*3/uL (ref 0.1–1.0)
Monocytes Relative: 14 %
Neutro Abs: 1.5 10*3/uL — ABNORMAL LOW (ref 1.7–7.7)
Neutrophils Relative %: 44 %
Platelets: 133 10*3/uL — ABNORMAL LOW (ref 150–400)
RBC: 4.06 MIL/uL (ref 3.87–5.11)
RDW: 14.4 % (ref 11.5–15.5)
WBC: 3.5 10*3/uL — ABNORMAL LOW (ref 4.0–10.5)
nRBC: 0 % (ref 0.0–0.2)

## 2019-05-28 LAB — CMP (CANCER CENTER ONLY)
ALT: 14 U/L (ref 0–44)
AST: 23 U/L (ref 15–41)
Albumin: 3.8 g/dL (ref 3.5–5.0)
Alkaline Phosphatase: 44 U/L (ref 38–126)
Anion gap: 10 (ref 5–15)
BUN: 30 mg/dL — ABNORMAL HIGH (ref 8–23)
CO2: 22 mmol/L (ref 22–32)
Calcium: 9 mg/dL (ref 8.9–10.3)
Chloride: 108 mmol/L (ref 98–111)
Creatinine: 1.15 mg/dL — ABNORMAL HIGH (ref 0.44–1.00)
GFR, Est AFR Am: 53 mL/min — ABNORMAL LOW (ref >60)
GFR, Estimated: 46 mL/min — ABNORMAL LOW (ref >60)
Glucose, Bld: 112 mg/dL — ABNORMAL HIGH (ref 70–99)
Potassium: 4 mmol/L (ref 3.5–5.1)
Sodium: 140 mmol/L (ref 135–145)
Total Bilirubin: 0.3 mg/dL (ref 0.3–1.2)
Total Protein: 6.8 g/dL (ref 6.5–8.1)

## 2019-05-28 LAB — TOTAL PROTEIN, URINE DIPSTICK: Protein, ur: NEGATIVE mg/dL

## 2019-05-28 MED ORDER — SODIUM CHLORIDE 0.9% FLUSH
10.0000 mL | INTRAVENOUS | Status: DC | PRN
  Administered 2019-05-28: 10 mL
  Filled 2019-05-28: qty 10

## 2019-05-28 MED ORDER — SODIUM CHLORIDE 0.9 % IV SOLN
Freq: Once | INTRAVENOUS | Status: AC
  Administered 2019-05-28: 15:00:00 via INTRAVENOUS
  Filled 2019-05-28: qty 250

## 2019-05-28 MED ORDER — SODIUM CHLORIDE 0.9 % IV SOLN
15.0000 mg/kg | Freq: Once | INTRAVENOUS | Status: AC
  Administered 2019-05-28: 1000 mg via INTRAVENOUS
  Filled 2019-05-28: qty 32

## 2019-05-28 MED ORDER — HEPARIN SOD (PORK) LOCK FLUSH 100 UNIT/ML IV SOLN
500.0000 [IU] | Freq: Once | INTRAVENOUS | Status: AC | PRN
  Administered 2019-05-28: 16:00:00 500 [IU]
  Filled 2019-05-28: qty 5

## 2019-05-28 NOTE — Patient Instructions (Signed)
[facility] Discharge Instructions for Patients Receiving Chemotherapy  Today you received the following chemotherapy agents Avastin  To help prevent nausea and vomiting after your treatment, we encourage you to take your nausea medication as directed   If you develop nausea and vomiting that is not controlled by your nausea medication, call the clinic.   BELOW ARE SYMPTOMS THAT SHOULD BE REPORTED IMMEDIATELY:  *FEVER GREATER THAN 100.5 F  *CHILLS WITH OR WITHOUT FEVER  NAUSEA AND VOMITING THAT IS NOT CONTROLLED WITH YOUR NAUSEA MEDICATION  *UNUSUAL SHORTNESS OF BREATH  *UNUSUAL BRUISING OR BLEEDING  TENDERNESS IN MOUTH AND THROAT WITH OR WITHOUT PRESENCE OF ULCERS  *URINARY PROBLEMS  *BOWEL PROBLEMS  UNUSUAL RASH Items with * indicate a potential emergency and should be followed up as soon as possible.  Feel free to call the clinic should you have any questions or concerns. The clinic phone number is [phone].  Please show the CHEMO ALERT CARD at check-in to the Emergency Department and triage nurse.   

## 2019-05-29 LAB — CA 125: Cancer Antigen (CA) 125: 24.5 U/mL (ref 0.0–38.1)

## 2019-06-16 ENCOUNTER — Encounter: Payer: Self-pay | Admitting: Hematology and Oncology

## 2019-06-18 ENCOUNTER — Inpatient Hospital Stay: Payer: Medicare Other

## 2019-06-18 ENCOUNTER — Other Ambulatory Visit: Payer: Self-pay

## 2019-06-18 ENCOUNTER — Inpatient Hospital Stay (HOSPITAL_BASED_OUTPATIENT_CLINIC_OR_DEPARTMENT_OTHER): Payer: Medicare Other | Admitting: Hematology and Oncology

## 2019-06-18 ENCOUNTER — Encounter: Payer: Self-pay | Admitting: Hematology and Oncology

## 2019-06-18 VITALS — BP 121/51 | HR 55

## 2019-06-18 DIAGNOSIS — C561 Malignant neoplasm of right ovary: Secondary | ICD-10-CM

## 2019-06-18 DIAGNOSIS — I10 Essential (primary) hypertension: Secondary | ICD-10-CM | POA: Diagnosis not present

## 2019-06-18 DIAGNOSIS — Z7189 Other specified counseling: Secondary | ICD-10-CM

## 2019-06-18 DIAGNOSIS — D696 Thrombocytopenia, unspecified: Secondary | ICD-10-CM

## 2019-06-18 DIAGNOSIS — D61818 Other pancytopenia: Secondary | ICD-10-CM | POA: Diagnosis not present

## 2019-06-18 DIAGNOSIS — Z5112 Encounter for antineoplastic immunotherapy: Secondary | ICD-10-CM | POA: Diagnosis not present

## 2019-06-18 LAB — COMPREHENSIVE METABOLIC PANEL
ALT: 13 U/L (ref 0–44)
AST: 23 U/L (ref 15–41)
Albumin: 3.8 g/dL (ref 3.5–5.0)
Alkaline Phosphatase: 52 U/L (ref 38–126)
Anion gap: 9 (ref 5–15)
BUN: 32 mg/dL — ABNORMAL HIGH (ref 8–23)
CO2: 24 mmol/L (ref 22–32)
Calcium: 9.6 mg/dL (ref 8.9–10.3)
Chloride: 108 mmol/L (ref 98–111)
Creatinine, Ser: 1.08 mg/dL — ABNORMAL HIGH (ref 0.44–1.00)
GFR calc Af Amer: 57 mL/min — ABNORMAL LOW (ref >60)
GFR calc non Af Amer: 49 mL/min — ABNORMAL LOW (ref >60)
Glucose, Bld: 88 mg/dL (ref 70–99)
Potassium: 4.1 mmol/L (ref 3.5–5.1)
Sodium: 141 mmol/L (ref 135–145)
Total Bilirubin: 0.5 mg/dL (ref 0.3–1.2)
Total Protein: 6.9 g/dL (ref 6.5–8.1)

## 2019-06-18 LAB — CBC WITH DIFFERENTIAL/PLATELET
Abs Immature Granulocytes: 0.01 10*3/uL (ref 0.00–0.07)
Basophils Absolute: 0.1 10*3/uL (ref 0.0–0.1)
Basophils Relative: 1 %
Eosinophils Absolute: 0.4 10*3/uL (ref 0.0–0.5)
Eosinophils Relative: 8 %
HCT: 39.1 % (ref 36.0–46.0)
Hemoglobin: 12.8 g/dL (ref 12.0–15.0)
Immature Granulocytes: 0 %
Lymphocytes Relative: 28 %
Lymphs Abs: 1.3 10*3/uL (ref 0.7–4.0)
MCH: 29.8 pg (ref 26.0–34.0)
MCHC: 32.7 g/dL (ref 30.0–36.0)
MCV: 91.1 fL (ref 80.0–100.0)
Monocytes Absolute: 0.6 10*3/uL (ref 0.1–1.0)
Monocytes Relative: 13 %
Neutro Abs: 2.4 10*3/uL (ref 1.7–7.7)
Neutrophils Relative %: 50 %
Platelets: 127 10*3/uL — ABNORMAL LOW (ref 150–400)
RBC: 4.29 MIL/uL (ref 3.87–5.11)
RDW: 13.8 % (ref 11.5–15.5)
WBC: 4.8 10*3/uL (ref 4.0–10.5)
nRBC: 0 % (ref 0.0–0.2)

## 2019-06-18 LAB — TOTAL PROTEIN, URINE DIPSTICK: Protein, ur: NEGATIVE mg/dL

## 2019-06-18 MED ORDER — SODIUM CHLORIDE 0.9 % IV SOLN
15.0000 mg/kg | Freq: Once | INTRAVENOUS | Status: AC
  Administered 2019-06-18: 11:00:00 1000 mg via INTRAVENOUS
  Filled 2019-06-18: qty 32

## 2019-06-18 MED ORDER — HEPARIN SOD (PORK) LOCK FLUSH 100 UNIT/ML IV SOLN
500.0000 [IU] | Freq: Once | INTRAVENOUS | Status: AC | PRN
  Administered 2019-06-18: 12:00:00 500 [IU]
  Filled 2019-06-18: qty 5

## 2019-06-18 MED ORDER — SODIUM CHLORIDE 0.9% FLUSH
10.0000 mL | INTRAVENOUS | Status: DC | PRN
  Administered 2019-06-18: 10 mL
  Filled 2019-06-18: qty 10

## 2019-06-18 MED ORDER — ATENOLOL 25 MG PO TABS: 12.5000 mg | ORAL_TABLET | Freq: Every day | ORAL | 9 refills | Status: DC

## 2019-06-18 MED ORDER — SODIUM CHLORIDE 0.9% FLUSH
10.0000 mL | Freq: Once | INTRAVENOUS | Status: AC
  Administered 2019-06-18: 10 mL
  Filled 2019-06-18: qty 10

## 2019-06-18 MED ORDER — SODIUM CHLORIDE 0.9 % IV SOLN
Freq: Once | INTRAVENOUS | Status: AC
  Administered 2019-06-18: 10:00:00 via INTRAVENOUS
  Filled 2019-06-18: qty 250

## 2019-06-18 NOTE — Assessment & Plan Note (Signed)
Her blood pressure is satisfactory She has no signs of proteinuria She will continue her current prescriptions of blood pressure medicines

## 2019-06-18 NOTE — Assessment & Plan Note (Signed)
Her pancytopenia is gradually improving with time away from treatment.  Observe only She has mild chronic thrombocytopenia but not symptomatic

## 2019-06-18 NOTE — Progress Notes (Signed)
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[facility]) We have extensive discussions about plan of care I reviewed recent tumor marker with the patient We discussed the use of tumor marker as a monitoring tool but I have also advised the patient to report any signs or symptoms of changes in bowel habits, nausea, bloating or abnormal vaginal bleeding We will continue tumor marker monitoring once a month I plan to repeat imaging study in October, sooner if she have new symptoms In the meantime, she will tolerate bevacizumab indefinitely  Thrombocytopenia ([place]) Her pancytopenia is gradually improving with time away from treatment.  Observe only She has mild chronic thrombocytopenia but not symptomatic  Essential hypertension Her blood pressure is satisfactory She has no signs of proteinuria She will continue her current prescriptions of blood pressure medicines   No orders of the defined types were placed in this encounter.   INTERVAL HISTORY: Please see below for problem oriented charting. She returns for further follow-up Her shingles has healed completely She has scanned copies of her blood pressure at home which were within normal limits She denies recent signs or symptoms of nausea, changes in bowel habits or abdominal discomfort  SUMMARY OF ONCOLOGIC HISTORY: Oncology History Overview Note  Neg genetics. Additional tests revealed ER: 80%, PR 3%    Right ovarian epithelial cancer ([place])  07/01/2015 Initial Diagnosis   She presented with postmenopausal bleeding   07/02/2015 Imaging   US pelvis showed bilateral ovarian cyst   07/13/2015 Imaging   5.2 cm left adnexal cystic lesion, with indeterminate but probably benign characteristics. In a postmenopausal female, consider continued annual imaging followup with CT or MRI versus surgical  evaluation.  Colonic diverticulosis. No radiographic evidence of diverticulitis.  Cholelithiasis.  No radiographic evidence of cholecystitis.  Small hiatal hernia.   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  The tumor involves serosal surface of the right ovary and adjacent right fallopian tube.  Cervix and endometrium were within normal limits. ER positive Peritoneal washing was positive.   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   08/13/2015 Tumor Marker   Patient's tumor was tested for the following markers: CA-125 Results of the tumor marker test revealed 96   08/25/2015 - 12/08/2015 Chemotherapy   The patient had 6 cycles of carboplatin and taxol   09/07/2015 Tumor Marker   Patient's tumor was tested for the following markers: CA-125 Results of the tumor marker test revealed 51   10/05/2015 Tumor Marker   Patient's tumor was tested for the following markers: CA-125 Results of the tumor marker test revealed 29   11/09/2015 Tumor Marker   Patient's tumor was tested for the following markers: CA-125 Results of the tumor marker test revealed 44   12/08/2015 Tumor Marker   Patient's tumor was tested for the following markers: CA-125 Results of the tumor marker test revealed 30   12/15/2015 Genetic Testing   Genetics testing normal by [facility] Breast Ovarian panel 12-15-15   01/11/2016 Imaging   1. The only finding of note are several adjacent peripheral abnormal hypodensities along the anterior superior spleen margin, differential diagnostic considerations including small  peripheral interval splenic infarcts, splenic injury with subcapsular a fluid collections, or less  likely metastatic disease to the splenic margin. This likely merits observation. 2. Other imaging findings of potential clinical significance: Small type 1 hiatal hernia. Aortoiliac atherosclerotic vascular disease. Lower lumbar spondylosis and degenerative disc disease. Gallstones with potential mild gallbladder wall thickening.   03/07/2016 Tumor Marker   Patient's tumor was tested for the following markers: CA-125 Results of the tumor marker test revealed 24.2   04/06/2016 Imaging   1. No evidence of a ovarian cancer metastasis. 2. No ascites. 3. Post hysterectomy and oophorectomy. 4. Cholelithiasis with several large gallstones   04/06/2016 Tumor Marker   Patient's tumor was tested for the following markers: CA-125 Results of the tumor marker test revealed 22.8   05/30/2016 Tumor Marker   Patient's tumor was tested for the following markers: CA-125 Results of the tumor marker test revealed 21   09/07/2016 Tumor Marker   Patient's tumor was tested for the following markers: CA-125 Results of the tumor marker test revealed 22.4   11/28/2016 Tumor Marker   Patient's tumor was tested for the following markers: CA-125 Results of the tumor marker test revealed 18.8   02/23/2017 Tumor Marker   Patient's tumor was tested for the following markers: CA-125 Results of the tumor marker test revealed 19.1   06/02/2017 Tumor Marker   Patient's tumor was tested for the following markers: CA-125 Results of the tumor marker test revealed 18.3   08/24/2017 Mammogram   Pt reports mammogram complete   08/28/2017 Tumor Marker   Patient's tumor was tested for the following markers: CA-125 Results of the tumor marker test revealed 21.1   12/01/2017 Tumor Marker   Patient's tumor was tested for the following markers: CA-125 Results of the tumor marker test revealed 31.2   12/13/2017 Imaging   New 2.7 cm peritoneal soft tissue mass in anterior left lower quadrant, consistent with metastatic  disease.  No other sites of metastatic disease identified.  Incidental findings including: Cholelithiasis. Colonic diverticulosis. Tiny hiatal hernia. Aortic atherosclerosis.   01/09/2018 - 05/07/2018 Chemotherapy   The patient had carboplatin and taxol; After 2nd cycle, she developed carboplatin allergy. She received carboplatin desensitization protocol at [facility] for final 4 cycles, completed by 6/17. Avastin was added from 04/16/18 onwards   01/30/2018 Adverse Reaction   Potential carboplatin allergy is suspected. She received half the dose of prescribed carboplatin on cycle 2   05/31/2018 Tumor Marker   Patient's tumor was tested for the following markers: CA-125 Results of the tumor marker test revealed 26   05/31/2018 Imaging   1. Peritoneal soft tissue nodule identified on the previous study has decreased in the interval the. No progressive findings in the abdomen or pelvis on today's study to suggest disease progression. 2. Tiny pulmonary nodules, likely benign. Attention on follow-up recommended. 3. Cholelithiasis. 4.  Aortic Atherosclerois (ICD10-170.0) 5. Tiny hiatal hernia.     06/04/2018 -  Chemotherapy   The patient is placed on Avastin for maintenance   06/25/2018 Tumor Marker   Patient's tumor was tested for the following markers: CA-125 Results of the tumor marker test revealed 22.5   08/06/2018 Tumor Marker   Patient's tumor was tested for the following markers: CA-125 Results of the tumor marker test revealed 20.7   09/14/2018 Imaging   Status post hysterectomy and bilateral salpingo-oophorectomy.  Stable soft tissue nodule beneath the left lower anterior abdominal wall, likely reflecting stable peritoneal disease.  Scattered small subpleural nodules in  the lungs bilaterally, measuring up to 3 mm, technically indeterminate although likely benign. Please note that Fleischner Society guidelines do not apply. Attention on follow-up is suggested.  No evidence of  new/progressive metastatic disease.   09/14/2018 Tumor Marker   Patient's tumor was tested for the following markers: CA-125 Results of the tumor marker test revealed 21.3   10/30/2018 Tumor Marker   Patient's tumor was tested for the following markers: CA-125 Results of the tumor marker test revealed 20.8   12/12/2018 Tumor Marker   Patient's tumor was tested for the following markers: CA-125 Results of the tumor marker test revealed 19.7   01/01/2019 Tumor Marker   Patient's tumor was tested for the following markers: CA-125 Results of the tumor marker test revealed 21.6   01/22/2019 Tumor Marker   Patient's tumor was tested for the following markers: CA-125 Results of the tumor marker test revealed 21.3   02/12/2019 Tumor Marker   Patient's tumor was tested for the following markers: CA-125 Results of the tumor marker test revealed 21.6   04/16/2019 Tumor Marker   Patient's tumor was tested for the following markers: CA-125 Results of the tumor marker test revealed 21.7   05/07/2019 Tumor Marker   Patient's tumor was tested for the following markers: CA-125 Results of the tumor marker test revealed 27.3   05/28/2019 Tumor Marker   Patient's tumor was tested for the following markers: CA-125 Results of the tumor marker test revealed 24.5     REVIEW OF SYSTEMS:   Constitutional: Denies fevers, chills or abnormal weight loss Eyes: Denies blurriness of vision Ears, nose, mouth, throat, and face: Denies mucositis or sore throat Respiratory: Denies cough, dyspnea or wheezes Cardiovascular: Denies palpitation, chest discomfort or lower extremity swelling Gastrointestinal:  Denies nausea, heartburn or change in bowel habits Skin: Denies abnormal skin rashes Lymphatics: Denies new lymphadenopathy or easy bruising Neurological:Denies numbness, tingling or new weaknesses Behavioral/Psych: Mood is stable, no new changes  All other systems were reviewed with the patient and are  negative.  I have reviewed the past medical history, past surgical history, social history and family history with the patient and they are unchanged from previous note.  ALLERGIES:  is allergic to carboplatin.  MEDICATIONS:  Current Outpatient Medications  Medication Sig Dispense Refill  . amLODipine (NORVASC) 10 MG tablet Take 1 tablet (10 mg total) by mouth daily. 90 tablet 3  . atenolol (TENORMIN) 25 MG tablet Take 0.5 tablets (12.5 mg total) by mouth daily. 30 tablet 9  . atorvastatin (LIPITOR) 10 MG tablet Take 1 tablet (10 mg total) by mouth daily. 30 tablet 2  . Coenzyme Q10 (CO Q 10) 100 MG CAPS Take 1 capsule by mouth daily.     . Glucosamine-Chondroit-Vit C-Mn (GLUCOSAMINE 1500 COMPLEX PO) Take 1 capsule by mouth 2 (two) times daily.    . hydrochlorothiazide (HYDRODIURIL) 12.5 MG tablet Take 3 tablets (37.5 mg total) by mouth daily. 90 tablet 1  . lidocaine-prilocaine (EMLA) cream Apply to affected area once 30 g 3  . lisinopril (PRINIVIL,ZESTRIL) 30 MG tablet Take 1 tablet (30 mg total) by mouth daily. 30 tablet 11  . loratadine (CLARITIN) 10 MG tablet Take 10 mg by mouth daily as needed for allergies (hives).    . Multiple Vitamin (MULTIVITAMIN) capsule Take 1 capsule by mouth daily.     . Omega-3 Fatty Acids (OMEGA-3 FISH OIL) 300 MG CAPS Take 1 capsule by mouth daily.     . ondansetron (ZOFRAN) 8 MG tablet Take  1 tablet (8 mg total) by mouth 2 (two) times daily as needed for refractory nausea / vomiting. Start on day 3 after chemo. 30 tablet 1  . Polyethyl Glycol-Propyl Glycol (SYSTANE ULTRA OP) Apply 1 drop to eye 2 (two) times daily at 10 AM and 5 PM.    . Probiotic Product (PROBIOTIC ADVANCED PO) Take 1 capsule by mouth daily.    . prochlorperazine (COMPAZINE) 10 MG tablet Take 1 tablet (10 mg total) by mouth every 6 (six) hours as needed (Nausea or vomiting). 60 tablet 1  . vitamin E 400 UNIT capsule Take 400 Units by mouth every evening.      No current  facility-administered medications for this visit.     PHYSICAL EXAMINATION: ECOG PERFORMANCE STATUS: 1 - Symptomatic but completely ambulatory  Vitals:   06/18/19 0856  BP: (!) 136/51  Pulse: (!) 56  Resp: 18  Temp: 98.5 F (36.9 C)  SpO2: 100%   Filed Weights   06/18/19 0856  Weight: 137 lb (62.1 kg)    GENERAL:alert, no distress and comfortable SKIN: skin color, texture, turgor are normal, no rashes or significant lesions EYES: normal, Conjunctiva are pink and non-injected, sclera clear OROPHARYNX:no exudate, no erythema and lips, buccal mucosa, and tongue normal  NECK: supple, thyroid normal size, non-tender, without nodularity LYMPH:  no palpable lymphadenopathy in the cervical, axillary or inguinal LUNGS: clear to auscultation and percussion with normal breathing effort HEART: regular rate & rhythm and no murmurs and no lower extremity edema ABDOMEN:abdomen soft, non-tender and normal bowel sounds Musculoskeletal:no cyanosis of digits and no clubbing  NEURO: alert & oriented x 3 with fluent speech, no focal motor/sensory deficits  LABORATORY DATA:  I have reviewed the data as listed    Component Value Date/Time   NA 141 06/18/2019 0840   NA 142 11/28/2016 0916   K 4.1 06/18/2019 0840   K 3.9 11/28/2016 0916   CL 108 06/18/2019 0840   CO2 24 06/18/2019 0840   CO2 28 11/28/2016 0916   GLUCOSE 88 06/18/2019 0840   GLUCOSE 78 11/28/2016 0916   BUN 32 (H) 06/18/2019 0840   BUN 16.8 11/28/2016 0916   CREATININE 1.08 (H) 06/18/2019 0840   CREATININE 1.15 (H) 05/28/2019 1345   CREATININE 0.8 11/28/2016 0916   CALCIUM 9.6 06/18/2019 0840   CALCIUM 10.5 (H) 11/28/2016 0916   PROT 6.9 06/18/2019 0840   PROT 7.3 11/28/2016 0916   ALBUMIN 3.8 06/18/2019 0840   ALBUMIN 4.3 11/28/2016 0916   AST 23 06/18/2019 0840   AST 23 05/28/2019 1345   AST 19 11/28/2016 0916   ALT 13 06/18/2019 0840   ALT 14 05/28/2019 1345   ALT 19 11/28/2016 0916   ALKPHOS 52 06/18/2019 0840    ALKPHOS 50 11/28/2016 0916   BILITOT 0.5 06/18/2019 0840   BILITOT 0.3 05/28/2019 1345   BILITOT 0.48 11/28/2016 0916   GFRNONAA 49 (L) 06/18/2019 0840   GFRNONAA 46 (L) 05/28/2019 1345   GFRAA 57 (L) 06/18/2019 0840   GFRAA 53 (L) 05/28/2019 1345    No results found for: SPEP, UPEP  Lab Results  Component Value Date   WBC 4.8 06/18/2019   NEUTROABS 2.4 06/18/2019   HGB 12.8 06/18/2019   HCT 39.1 06/18/2019   MCV 91.1 06/18/2019   PLT 127 (L) 06/18/2019      Chemistry      Component Value Date/Time   NA 141 06/18/2019 0840   NA 142 11/28/2016 0916   K 4.1  06/18/2019 0840   K 3.9 11/28/2016 0916   CL 108 06/18/2019 0840   CO2 24 06/18/2019 0840   CO2 28 11/28/2016 0916   BUN 32 (H) 06/18/2019 0840   BUN 16.8 11/28/2016 0916   CREATININE 1.08 (H) 06/18/2019 0840   CREATININE 1.15 (H) 05/28/2019 1345   CREATININE 0.8 11/28/2016 0916      Component Value Date/Time   CALCIUM 9.6 06/18/2019 0840   CALCIUM 10.5 (H) 11/28/2016 0916   ALKPHOS 52 06/18/2019 0840   ALKPHOS 50 11/28/2016 0916   AST 23 06/18/2019 0840   AST 23 05/28/2019 1345   AST 19 11/28/2016 0916   ALT 13 06/18/2019 0840   ALT 14 05/28/2019 1345   ALT 19 11/28/2016 0916   BILITOT 0.5 06/18/2019 0840   BILITOT 0.3 05/28/2019 1345   BILITOT 0.48 11/28/2016 0916       All questions were answered. The patient knows to call the clinic with any problems, questions or concerns. No barriers to learning was detected.  I spent 15 minutes counseling the patient face to face. The total time spent in the appointment was 20 minutes and more than 50% was on counseling and review of test results  Heath Lark, MD 06/18/2019 9:49 AM

## 2019-06-18 NOTE — Assessment & Plan Note (Signed)
We have extensive discussions about plan of care I reviewed recent tumor marker with the patient We discussed the use of tumor marker as a monitoring tool but I have also advised the patient to report any signs or symptoms of changes in bowel habits, nausea, bloating or abnormal vaginal bleeding We will continue tumor marker monitoring once a month I plan to repeat imaging study in October, sooner if she have new symptoms In the meantime, she will tolerate bevacizumab indefinitely

## 2019-06-18 NOTE — Patient Instructions (Signed)
[facility] Discharge Instructions for Patients Receiving Chemotherapy  Today you received the following chemotherapy agents Avastin.   To help prevent nausea and vomiting after your treatment, we encourage you to take your nausea medication as prescribed.    If you develop nausea and vomiting that is not controlled by your nausea medication, call the clinic.   BELOW ARE SYMPTOMS THAT SHOULD BE REPORTED IMMEDIATELY:  *FEVER GREATER THAN 100.5 F  *CHILLS WITH OR WITHOUT FEVER  NAUSEA AND VOMITING THAT IS NOT CONTROLLED WITH YOUR NAUSEA MEDICATION  *UNUSUAL SHORTNESS OF BREATH  *UNUSUAL BRUISING OR BLEEDING  TENDERNESS IN MOUTH AND THROAT WITH OR WITHOUT PRESENCE OF ULCERS  *URINARY PROBLEMS  *BOWEL PROBLEMS  UNUSUAL RASH Items with * indicate a potential emergency and should be followed up as soon as possible.  Feel free to call the clinic should you have any questions or concerns. The clinic phone number is [phone].  Please show the CHEMO ALERT CARD at check-in to the Emergency Department and triage nurse.   

## 2019-06-19 ENCOUNTER — Other Ambulatory Visit: Payer: Self-pay | Admitting: Hematology and Oncology

## 2019-06-19 ENCOUNTER — Encounter: Payer: Self-pay | Admitting: Hematology and Oncology

## 2019-06-19 ENCOUNTER — Telehealth: Payer: Self-pay | Admitting: Hematology and Oncology

## 2019-06-19 MED ORDER — LISINOPRIL 30 MG PO TABS: 30.0000 mg | ORAL_TABLET | Freq: Every day | ORAL | 11 refills | Status: DC

## 2019-06-19 NOTE — Progress Notes (Deleted)
06/19/19  Received approval from Dr Alvy Bimler to change treatment plan to Mvasi (bevacizumab-awwb) an interchangable biosimilar bevacizumab product.  Dr Alvy Bimler relayed that patient has agreed to change to new bevacizumab product.  Orders changed as noted above.  Thank you for allowing me to participate in this patients care.  T.O. Dr Lesli Albee, PharmD

## 2019-06-19 NOTE — Telephone Encounter (Signed)
I talk with patient regarding schedule  

## 2019-06-24 ENCOUNTER — Other Ambulatory Visit: Payer: Self-pay | Admitting: Hematology and Oncology

## 2019-06-27 ENCOUNTER — Encounter: Payer: Self-pay | Admitting: Hematology and Oncology

## 2019-07-04 ENCOUNTER — Encounter: Payer: Self-pay | Admitting: Hematology and Oncology

## 2019-07-04 ENCOUNTER — Telehealth: Payer: Self-pay | Admitting: Oncology

## 2019-07-04 NOTE — Telephone Encounter (Signed)
Emily Hull MyChart message.  She said that she has a feeling that she something is protruding from Hull vagina.  She said it feels like pressure from a rubber ball that sometimes pulls back.  She denies having any pain, vaginal bleeding or changes in Hull bowel/bladder habits.    Discussed that there are two recommendations per Dr. Alvy Hull: she can see a Gyn Oncologist in [place] or schedule a CT scan.  She said she would like to see a Counselling psychologist in [place].   Advised Hull that Dr. Fermin Hull is only doing virtual visits due to Covid and asked if she would like to see Dr. Skeet Hull or Dr. Denman Hull in [place].  Offered to see if we can schedule an appointment with Dr. Skeet Hull this afternoon and she said that she is not able to make it today.  Advised Hull that we will call Hull back with an appointment for next week.

## 2019-07-04 NOTE — Telephone Encounter (Signed)
Called Emily Hull and asked if she would like to delay her infusion on Tuesday, 07/09/19 until after she sees the [facility].  She said she would like to keep the infusion appointment.   Scheduled appointment to see Dr. Skeet Latch on 07/11/19 at 2 pm.  She verbalized understanding and agreement.

## 2019-07-05 NOTE — Progress Notes (Signed)
Avastin per FDA indication - no biosimilar can be used.  Henreitta Leber, PharmD

## 2019-07-08 ENCOUNTER — Other Ambulatory Visit: Payer: Self-pay | Admitting: Hematology and Oncology

## 2019-07-08 NOTE — Progress Notes (Signed)
The following biosimilar Mvasi (bevacizumab-awwb) has been selected for use in this patient.  Mvasi is pref by pts insur.  No auth required per referral notes.  Kennith Center, Pharm.D., CPP 07/08/2019@11 :02 AM

## 2019-07-08 NOTE — Progress Notes (Signed)
OFF PATHWAY REGIMEN - Ovarian  No Change  Continue With Treatment as Ordered.   [id number]:Bevacizumab 15 mg/kg q21d:   A cycle is every 21 days:     Bevacizumab   **Always confirm dose/schedule in your pharmacy ordering system**  Patient Characteristics: Recurrent or Progressive Disease, Maintenance Therapy Therapeutic Status: Recurrent or Progressive Disease AJCC T Category: T3 AJCC N Category: N0 AJCC M Category: M0 AJCC 8 Stage Grouping: Unknown BRCA Mutation Status: Absent Line of Therapy: Maintenance  Intent of Therapy: Non-Curative / Palliative Intent, Discussed with Patient

## 2019-07-09 ENCOUNTER — Other Ambulatory Visit: Payer: Self-pay

## 2019-07-09 ENCOUNTER — Inpatient Hospital Stay: Payer: Medicare Other | Attending: Gynecology

## 2019-07-09 ENCOUNTER — Inpatient Hospital Stay: Payer: Medicare Other

## 2019-07-09 ENCOUNTER — Encounter: Payer: Self-pay | Admitting: Oncology

## 2019-07-09 VITALS — BP 136/61 | HR 62 | Temp 98.5°F | Resp 16 | Wt 138.5 lb

## 2019-07-09 DIAGNOSIS — Z5112 Encounter for antineoplastic immunotherapy: Secondary | ICD-10-CM | POA: Diagnosis not present

## 2019-07-09 DIAGNOSIS — E7800 Pure hypercholesterolemia, unspecified: Secondary | ICD-10-CM | POA: Insufficient documentation

## 2019-07-09 DIAGNOSIS — Z90722 Acquired absence of ovaries, bilateral: Secondary | ICD-10-CM | POA: Insufficient documentation

## 2019-07-09 DIAGNOSIS — C561 Malignant neoplasm of right ovary: Secondary | ICD-10-CM

## 2019-07-09 DIAGNOSIS — E249 Cushing's syndrome, unspecified: Secondary | ICD-10-CM | POA: Insufficient documentation

## 2019-07-09 DIAGNOSIS — M8580 Other specified disorders of bone density and structure, unspecified site: Secondary | ICD-10-CM | POA: Insufficient documentation

## 2019-07-09 DIAGNOSIS — Z90710 Acquired absence of both cervix and uterus: Secondary | ICD-10-CM | POA: Diagnosis not present

## 2019-07-09 DIAGNOSIS — Z7189 Other specified counseling: Secondary | ICD-10-CM

## 2019-07-09 LAB — COMPREHENSIVE METABOLIC PANEL
ALT: 13 U/L (ref 0–44)
AST: 23 U/L (ref 15–41)
Albumin: 3.7 g/dL (ref 3.5–5.0)
Alkaline Phosphatase: 52 U/L (ref 38–126)
Anion gap: 11 (ref 5–15)
BUN: 37 mg/dL — ABNORMAL HIGH (ref 8–23)
CO2: 23 mmol/L (ref 22–32)
Calcium: 9.4 mg/dL (ref 8.9–10.3)
Chloride: 109 mmol/L (ref 98–111)
Creatinine, Ser: 1.1 mg/dL — ABNORMAL HIGH (ref 0.44–1.00)
GFR calc Af Amer: 56 mL/min — ABNORMAL LOW (ref >60)
GFR calc non Af Amer: 48 mL/min — ABNORMAL LOW (ref >60)
Glucose, Bld: 101 mg/dL — ABNORMAL HIGH (ref 70–99)
Potassium: 3.9 mmol/L (ref 3.5–5.1)
Sodium: 143 mmol/L (ref 135–145)
Total Bilirubin: 0.4 mg/dL (ref 0.3–1.2)
Total Protein: 6.7 g/dL (ref 6.5–8.1)

## 2019-07-09 LAB — CBC WITH DIFFERENTIAL/PLATELET
Abs Immature Granulocytes: 0 10*3/uL (ref 0.00–0.07)
Basophils Absolute: 0 10*3/uL (ref 0.0–0.1)
Basophils Relative: 1 %
Eosinophils Absolute: 0.3 10*3/uL (ref 0.0–0.5)
Eosinophils Relative: 7 %
HCT: 37.9 % (ref 36.0–46.0)
Hemoglobin: 12.6 g/dL (ref 12.0–15.0)
Immature Granulocytes: 0 %
Lymphocytes Relative: 30 %
Lymphs Abs: 1.3 10*3/uL (ref 0.7–4.0)
MCH: 30.4 pg (ref 26.0–34.0)
MCHC: 33.2 g/dL (ref 30.0–36.0)
MCV: 91.5 fL (ref 80.0–100.0)
Monocytes Absolute: 0.5 10*3/uL (ref 0.1–1.0)
Monocytes Relative: 10 %
Neutro Abs: 2.3 10*3/uL (ref 1.7–7.7)
Neutrophils Relative %: 52 %
Platelets: 107 10*3/uL — ABNORMAL LOW (ref 150–400)
RBC: 4.14 MIL/uL (ref 3.87–5.11)
RDW: 14.1 % (ref 11.5–15.5)
WBC: 4.4 10*3/uL (ref 4.0–10.5)
nRBC: 0 % (ref 0.0–0.2)

## 2019-07-09 LAB — TOTAL PROTEIN, URINE DIPSTICK: Protein, ur: 30 mg/dL — AB

## 2019-07-09 MED ORDER — SODIUM CHLORIDE 0.9 % IV SOLN
900.0000 mg | Freq: Once | INTRAVENOUS | Status: AC
  Administered 2019-07-09: 900 mg via INTRAVENOUS
  Filled 2019-07-09: qty 32

## 2019-07-09 MED ORDER — HEPARIN SOD (PORK) LOCK FLUSH 100 UNIT/ML IV SOLN
500.0000 [IU] | Freq: Once | INTRAVENOUS | Status: AC | PRN
  Administered 2019-07-09: 12:00:00 500 [IU]
  Filled 2019-07-09: qty 5

## 2019-07-09 MED ORDER — SODIUM CHLORIDE 0.9 % IV SOLN
Freq: Once | INTRAVENOUS | Status: AC
  Administered 2019-07-09: 11:00:00 via INTRAVENOUS
  Filled 2019-07-09: qty 250

## 2019-07-09 MED ORDER — SODIUM CHLORIDE 0.9% FLUSH
10.0000 mL | INTRAVENOUS | Status: DC | PRN
  Administered 2019-07-09: 10 mL
  Filled 2019-07-09: qty 10

## 2019-07-09 MED ORDER — SODIUM CHLORIDE 0.9% FLUSH
10.0000 mL | Freq: Once | INTRAVENOUS | Status: AC
  Administered 2019-07-09: 10 mL
  Filled 2019-07-09: qty 10

## 2019-07-09 NOTE — Progress Notes (Signed)
Met with Emily Hull in the lobby and advised her that her infusion will be canceled and rescheduled today because insurance has not authorized the medication yet.  She verbalized agreement and requested that it be rescheduled for a Tuesday if possible.

## 2019-07-09 NOTE — Patient Instructions (Signed)

## 2019-07-09 NOTE — Patient Instructions (Signed)
[place] Discharge Instructions for Patients Receiving Chemotherapy  Today you received the following Immunotherapy: Bevacizumab-awwb (MVASI)  To help prevent nausea and vomiting after your treatment, we encourage you to take your nausea medication as directed by your MD.   If you develop nausea and vomiting that is not controlled by your nausea medication, call the clinic.   BELOW ARE SYMPTOMS THAT SHOULD BE REPORTED IMMEDIATELY:  *FEVER GREATER THAN 100.5 F  *CHILLS WITH OR WITHOUT FEVER  NAUSEA AND VOMITING THAT IS NOT CONTROLLED WITH YOUR NAUSEA MEDICATION  *UNUSUAL SHORTNESS OF BREATH  *UNUSUAL BRUISING OR BLEEDING  TENDERNESS IN MOUTH AND THROAT WITH OR WITHOUT PRESENCE OF ULCERS  *URINARY PROBLEMS  *BOWEL PROBLEMS  UNUSUAL RASH Items with * indicate a potential emergency and should be followed up as soon as possible.  Feel free to call the clinic should you have any questions or concerns. The clinic phone number is [phone].  Please show the [facility] at check-in to the Emergency Department and triage nurse. Coronavirus (COVID-19) Are you at risk?  Are you at risk for the Coronavirus (COVID-19)?  To be considered HIGH RISK for Coronavirus (COVID-19), you have to meet the following criteria:  . Traveled to [place], [place], [place], [place] or [place]; or in the [place] to [place], [place], [place], or [place]; and have fever, cough, and shortness of breath within the last 2 weeks of travel OR . Been in close contact with a person diagnosed with COVID-19 within the last 2 weeks and have fever, cough, and shortness of breath . IF YOU DO NOT MEET THESE CRITERIA, YOU ARE CONSIDERED LOW RISK FOR COVID-19.  What to do if you are HIGH RISK for COVID-19?  [name] If you are having a medical emergency, call 911. . Seek medical care right away. Before you go to a doctor's office, urgent care or emergency department, call ahead and tell  them about your recent travel, contact with someone diagnosed with COVID-19, and your symptoms. You should receive instructions from your physician's office regarding next steps of care.  . When you arrive at healthcare provider, tell the healthcare staff immediately you have returned from visiting [place], [place], [place], [place] or [place]; or traveled in the [place] to [place], [place], [place], or [place]; in the last two weeks or you have been in close contact with a person diagnosed with COVID-19 in the last 2 weeks.   . Tell the health care staff about your symptoms: fever, cough and shortness of breath. . After you have been seen by a medical provider, you will be either: o Tested for (COVID-19) and discharged home on quarantine except to seek medical care if symptoms worsen, and asked to  - Stay home and avoid contact with others until you get your results (4-5 days)  - Avoid travel on public transportation if possible (such as bus, train, or airplane) or o Sent to the Emergency Department by EMS for evaluation, COVID-19 testing, and possible admission depending on your condition and test results.  What to do if you are LOW RISK for COVID-19?  Reduce your risk of any infection by using the same precautions used for avoiding the common cold or flu:  [name] Wash your hands often with soap and warm water for at least 20 seconds.  If soap and water are not readily available, use an alcohol-based hand sanitizer with at least 60% alcohol.  . If coughing  or sneezing, cover your mouth and nose by coughing or sneezing into the elbow areas of your shirt or coat, into a tissue or into your sleeve (not your hands). . Avoid shaking hands with others and consider head nods or verbal greetings only. . Avoid touching your eyes, nose, or mouth with unwashed hands.  . Avoid close contact with people who are sick. . Avoid places or events with large numbers of people in one location, like concerts or  sporting events. . Carefully consider travel plans you have or are making. . If you are planning any travel outside or inside the [name], visit the CDC's Travelers' Health webpage for the latest health notices. . If you have some symptoms but not all symptoms, continue to monitor at home and seek medical attention if your symptoms worsen. . If you are having a medical emergency, call 911.   [place] / e-Visit: [email or web address]         [facility]: [facility]: [phone]                   [facility]: [phone]

## 2019-07-10 LAB — CA 125: Cancer Antigen (CA) 125: 23.3 U/mL (ref 0.0–38.1)

## 2019-07-11 ENCOUNTER — Inpatient Hospital Stay (HOSPITAL_BASED_OUTPATIENT_CLINIC_OR_DEPARTMENT_OTHER): Payer: Medicare Other | Admitting: Gynecologic Oncology

## 2019-07-11 ENCOUNTER — Other Ambulatory Visit: Payer: Self-pay

## 2019-07-11 VITALS — BP 149/61 | HR 60 | Temp 98.7°F | Resp 17 | Ht 62.75 in | Wt 138.6 lb

## 2019-07-11 DIAGNOSIS — Z5112 Encounter for antineoplastic immunotherapy: Secondary | ICD-10-CM | POA: Diagnosis not present

## 2019-07-11 DIAGNOSIS — C561 Malignant neoplasm of right ovary: Secondary | ICD-10-CM

## 2019-07-11 NOTE — Patient Instructions (Signed)
Call [phone] in December for a follow up appointment to be scheduled for January.

## 2019-07-11 NOTE — Progress Notes (Signed)
GYN ONCOLOGY OFFICE VISIT    CHIEF COMPLAINT: Ovarian cancer, maintenance therapy, surveillance   Oncology History Overview Note  Neg genetics. 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 In a postmenopausal female, consider continued annual imaging followup with CT or MRI versus surgical evaluation.  Colonic diverticulosis. No radiographic evidence of diverticulitis.  Cholelithiasis.  No radiographic evidence of cholecystitis.  Small hiatal hernia.   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  The tumor involves serosal surface of the right ovary and adjacent right fallopian tube.  Cervix and endometrium were within normal limits. ER positive Peritoneal washing was positive.   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   08/13/2015 Tumor Marker   Patient's tumor was tested for the following markers: CA-125 Results of the tumor marker test revealed 96   08/25/2015 - 12/08/2015 Chemotherapy   The patient had 6 cycles of carboplatin and taxol   09/07/2015 Tumor Marker   Patient's tumor was tested for the following markers: CA-125 Results of the tumor marker test revealed 51   10/05/2015 Tumor Marker   Patient's tumor was tested for the following markers: CA-125 Results of the tumor marker test revealed 29   11/09/2015 Tumor Marker   Patient's tumor was tested for the following  markers: CA-125 Results of the tumor marker test revealed 44   12/08/2015 Tumor Marker   Patient's tumor was tested for the following markers: CA-125 Results of the tumor marker test revealed 30   12/15/2015 Genetic Testing   Genetics testing normal by [facility] Breast Ovarian panel 12-15-15   01/11/2016 Imaging   1. The only finding of note are several adjacent peripheral abnormal hypodensities along the anterior superior spleen margin, differential diagnostic considerations including small peripheral interval splenic infarcts, splenic injury with subcapsular a fluid collections, or less likely metastatic disease to the splenic margin. This likely merits observation. 2. Other imaging findings of potential clinical significance: Small type 1 hiatal hernia. Aortoiliac atherosclerotic vascular disease. Lower lumbar spondylosis and degenerative disc disease. Gallstones with potential mild gallbladder wall thickening.   03/07/2016 Tumor Marker   Patient's tumor was tested for the following markers: CA-125 Results of the tumor marker test revealed 24.2   04/06/2016 Imaging   1. No evidence of a ovarian cancer metastasis. 2. No ascites. 3. Post hysterectomy and oophorectomy. 4. Cholelithiasis with several large gallstones   04/06/2016 Tumor Marker   Patient's tumor was tested for the following markers: CA-125 Results of the tumor marker test revealed 22.8   05/30/2016 Tumor Marker   Patient's tumor was tested for the following markers: CA-125 Results of the tumor marker test revealed 21   09/07/2016 Tumor Marker   Patient's tumor was tested for the following markers: CA-125 Results of the tumor marker test revealed 22.4   11/28/2016 Tumor Marker   Patient's tumor was tested for the following markers: CA-125 Results of the tumor marker test revealed 18.8   02/23/2017 Tumor Marker   Patient's tumor was tested  for the following markers: CA-125 Results of the tumor marker test revealed 19.1    06/02/2017 Tumor Marker   Patient's tumor was tested for the following markers: CA-125 Results of the tumor marker test revealed 18.3   08/24/2017 Mammogram   Pt reports mammogram complete   08/28/2017 Tumor Marker   Patient's tumor was tested for the following markers: CA-125 Results of the tumor marker test revealed 21.1   12/01/2017 Tumor Marker   Patient's tumor was tested for the following markers: CA-125 Results of the tumor marker test revealed 31.2   12/13/2017 Imaging   New 2.7 cm peritoneal soft tissue mass in anterior left lower quadrant, consistent with metastatic disease.  No other sites of metastatic disease identified.  Incidental findings including: Cholelithiasis. Colonic diverticulosis. Tiny hiatal hernia. Aortic atherosclerosis.   01/09/2018 - 05/07/2018 Chemotherapy   The patient had carboplatin and taxol; After 2nd cycle, she developed carboplatin allergy. She received carboplatin desensitization [facility] for final 4 cycles, completed by 6/17. Avastin was added from 04/16/18 onwards   01/30/2018 Adverse Reaction   Potential carboplatin allergy is suspected. She received half the dose of prescribed carboplatin on cycle 2   05/31/2018 Tumor Marker   Patient's tumor was tested for the following markers: CA-125 Results of the tumor marker test revealed 26   05/31/2018 Imaging   1. Peritoneal soft tissue nodule identified on the previous study has decreased in the interval the. No progressive findings in the abdomen or pelvis on today's study to suggest disease progression. 2. Tiny pulmonary nodules, likely benign. Attention on follow-up recommended. 3. Cholelithiasis. 4.  Aortic Atherosclerois (ICD10-170.0) 5. Tiny hiatal hernia.     06/04/2018 -  Chemotherapy   The patient is placed on Avastin for maintenance   06/25/2018 Tumor Marker   Patient's tumor was tested for the following markers: CA-125 Results of the tumor marker test revealed 22.5    08/06/2018 Tumor Marker   Patient's tumor was tested for the following markers: CA-125 Results of the tumor marker test revealed 20.7   09/14/2018 Imaging   Status post hysterectomy and bilateral salpingo-oophorectomy.  Stable soft tissue nodule beneath the left lower anterior abdominal wall, likely reflecting stable peritoneal disease.  Scattered small subpleural nodules in the lungs bilaterally, measuring up to 3 mm, technically indeterminate although likely benign. Please note that Fleischner Society guidelines do not apply. Attention on follow-up is suggested.  No evidence of new/progressive metastatic disease.   09/14/2018 Tumor Marker   Patient's tumor was tested for the following markers: CA-125 Results of the tumor marker test revealed 21.3   10/30/2018 Tumor Marker   Patient's tumor was tested for the following markers: CA-125 Results of the tumor marker test revealed 20.8   12/12/2018 Tumor Marker   Patient's tumor was tested for the following markers: CA-125 Results of the tumor marker test revealed 19.7   01/01/2019 Tumor Marker   Patient's tumor was tested for the following markers: CA-125 Results of the tumor marker test revealed 21.6   01/22/2019 Tumor Marker   Patient's tumor was tested for the following markers: CA-125 Results of the tumor marker test revealed 21.3   02/12/2019 Tumor Marker   Patient's tumor was tested for the following markers: CA-125 Results of the tumor marker test revealed 21.6   04/16/2019 Tumor Marker   Patient's tumor was tested for the following markers: CA-125 Results of the tumor marker test revealed 21.7   05/07/2019 Tumor Marker   Patient's tumor was tested for  the following markers: CA-125 Results of the tumor marker test revealed 27.3   05/28/2019 Tumor Marker   Patient's tumor was tested for the following markers: CA-125 Results of the tumor marker test revealed 24.5   07/09/2019 -  Chemotherapy   The patient had  bevacizumab-awwb (MVASI) 900 mg in sodium chloride 0.9 % 100 mL chemo infusion, 900 mg (100 % of original dose 900 mg), Intravenous,  Once, 1 of 4 cycles Dose modification: 900 mg (original dose 900 mg, Cycle 1) Administration: 900 mg (07/09/2019)  for chemotherapy treatment.    07/09/2019 Tumor Marker   Patient's tumor was tested for the following markers: CA-125 Results of the tumor marker test revealed 23.3.    Past Medical History:  Diagnosis Date  . Cushing's disease ([place])   . Elevated cholesterol   . Osteopenia   . Ovarian cancer [facility])    Past Surgical History:  Procedure Laterality Date  . IR FLUORO GUIDE PORT INSERTION RIGHT  01/03/2018  . IR US GUIDE VASC ACCESS RIGHT  01/03/2018  . VAGINAL HYSTERECTOMY     Review of Systems  Constitutional: Negative for chills, fever and weight loss.  HENT: Negative for hearing loss and sinus pain.   Eyes: Negative.   Respiratory: Negative.  Negative for stridor.   Cardiovascular: Negative.   Gastrointestinal: Negative for abdominal pain, heartburn, melena and vomiting.  Genitourinary: Negative for dysuria, frequency, hematuria and urgency.  Musculoskeletal: Negative.   Skin: Negative.   Neurological: Negative.   Psychiatric/Behavioral: Negative.    Physical Exam Constitutional:      Appearance: Normal appearance. She is not ill-appearing or diaphoretic.  Neck:     Musculoskeletal: Normal range of motion.     Vascular: No carotid bruit.  Cardiovascular:     Rate and Rhythm: Normal rate.     Pulses: Normal pulses.     Heart sounds: No murmur. No friction rub.  Pulmonary:     Effort: Pulmonary effort is normal.     Breath sounds: Normal breath sounds.  Abdominal:     General: Abdomen is flat. Bowel sounds are normal.     Palpations: Abdomen is soft.  Lymphadenopathy:     Cervical: No cervical adenopathy.  Neurological:     General: No focal deficit present.     Mental Status: She is alert.  Skin:    General: Skin is warm.   Psychiatric:        Mood and Affect: Mood normal.        Behavior: Behavior normal.

## 2019-07-20 ENCOUNTER — Other Ambulatory Visit: Payer: Self-pay | Admitting: Hematology and Oncology

## 2019-07-24 ENCOUNTER — Encounter: Payer: Self-pay | Admitting: Hematology and Oncology

## 2019-07-25 ENCOUNTER — Other Ambulatory Visit: Payer: Self-pay | Admitting: Hematology and Oncology

## 2019-07-25 DIAGNOSIS — E7800 Pure hypercholesterolemia, unspecified: Secondary | ICD-10-CM

## 2019-07-30 ENCOUNTER — Inpatient Hospital Stay: Payer: Medicare Other | Attending: Gynecology

## 2019-07-30 ENCOUNTER — Encounter: Payer: Self-pay | Admitting: Hematology and Oncology

## 2019-07-30 ENCOUNTER — Inpatient Hospital Stay (HOSPITAL_BASED_OUTPATIENT_CLINIC_OR_DEPARTMENT_OTHER): Payer: Medicare Other | Admitting: Hematology and Oncology

## 2019-07-30 ENCOUNTER — Inpatient Hospital Stay: Payer: Medicare Other

## 2019-07-30 ENCOUNTER — Other Ambulatory Visit: Payer: Self-pay

## 2019-07-30 VITALS — BP 158/56 | HR 64 | Temp 98.0°F | Resp 18 | Ht 62.75 in | Wt 142.2 lb

## 2019-07-30 VITALS — BP 156/69 | HR 59

## 2019-07-30 DIAGNOSIS — Z79899 Other long term (current) drug therapy: Secondary | ICD-10-CM | POA: Insufficient documentation

## 2019-07-30 DIAGNOSIS — Z5112 Encounter for antineoplastic immunotherapy: Secondary | ICD-10-CM | POA: Diagnosis not present

## 2019-07-30 DIAGNOSIS — C561 Malignant neoplasm of right ovary: Secondary | ICD-10-CM | POA: Insufficient documentation

## 2019-07-30 DIAGNOSIS — R1900 Intra-abdominal and pelvic swelling, mass and lump, unspecified site: Secondary | ICD-10-CM | POA: Diagnosis not present

## 2019-07-30 DIAGNOSIS — Z5111 Encounter for antineoplastic chemotherapy: Secondary | ICD-10-CM | POA: Insufficient documentation

## 2019-07-30 DIAGNOSIS — I10 Essential (primary) hypertension: Secondary | ICD-10-CM | POA: Diagnosis not present

## 2019-07-30 DIAGNOSIS — E7800 Pure hypercholesterolemia, unspecified: Secondary | ICD-10-CM

## 2019-07-30 DIAGNOSIS — Z7189 Other specified counseling: Secondary | ICD-10-CM

## 2019-07-30 LAB — COMPREHENSIVE METABOLIC PANEL
ALT: 13 U/L (ref 0–44)
AST: 26 U/L (ref 15–41)
Albumin: 4 g/dL (ref 3.5–5.0)
Alkaline Phosphatase: 49 U/L (ref 38–126)
Anion gap: 9 (ref 5–15)
BUN: 21 mg/dL (ref 8–23)
CO2: 22 mmol/L (ref 22–32)
Calcium: 9.4 mg/dL (ref 8.9–10.3)
Chloride: 109 mmol/L (ref 98–111)
Creatinine, Ser: 0.87 mg/dL (ref 0.44–1.00)
GFR calc Af Amer: >60 mL/min (ref >60)
GFR calc non Af Amer: >60 mL/min (ref >60)
Glucose, Bld: 89 mg/dL (ref 70–99)
Potassium: 3.9 mmol/L (ref 3.5–5.1)
Sodium: 140 mmol/L (ref 135–145)
Total Bilirubin: 0.5 mg/dL (ref 0.3–1.2)
Total Protein: 6.6 g/dL (ref 6.5–8.1)

## 2019-07-30 LAB — CBC WITH DIFFERENTIAL/PLATELET
Abs Immature Granulocytes: 0.01 10*3/uL (ref 0.00–0.07)
Basophils Absolute: 0.1 10*3/uL (ref 0.0–0.1)
Basophils Relative: 1 %
Eosinophils Absolute: 0.4 10*3/uL (ref 0.0–0.5)
Eosinophils Relative: 9 %
HCT: 39.7 % (ref 36.0–46.0)
Hemoglobin: 13.1 g/dL (ref 12.0–15.0)
Immature Granulocytes: 0 %
Lymphocytes Relative: 27 %
Lymphs Abs: 1.2 10*3/uL (ref 0.7–4.0)
MCH: 30.5 pg (ref 26.0–34.0)
MCHC: 33 g/dL (ref 30.0–36.0)
MCV: 92.5 fL (ref 80.0–100.0)
Monocytes Absolute: 0.5 10*3/uL (ref 0.1–1.0)
Monocytes Relative: 11 %
Neutro Abs: 2.2 10*3/uL (ref 1.7–7.7)
Neutrophils Relative %: 52 %
Platelets: 88 10*3/uL — ABNORMAL LOW (ref 150–400)
RBC: 4.29 MIL/uL (ref 3.87–5.11)
RDW: 13.7 % (ref 11.5–15.5)
WBC: 4.3 10*3/uL (ref 4.0–10.5)
nRBC: 0 % (ref 0.0–0.2)

## 2019-07-30 LAB — LIPID PANEL
Cholesterol: 170 mg/dL (ref 0–200)
HDL: 42 mg/dL (ref >40)
LDL Cholesterol: 104 mg/dL — ABNORMAL HIGH (ref 0–99)
Total CHOL/HDL Ratio: 4 RATIO
Triglycerides: 120 mg/dL (ref <150)
VLDL: 24 mg/dL (ref 0–40)

## 2019-07-30 LAB — TOTAL PROTEIN, URINE DIPSTICK: Protein, ur: 30 mg/dL — AB

## 2019-07-30 MED ORDER — SODIUM CHLORIDE 0.9 % IV SOLN
900.0000 mg | Freq: Once | INTRAVENOUS | Status: AC
  Administered 2019-07-30: 900 mg via INTRAVENOUS
  Filled 2019-07-30: qty 4

## 2019-07-30 MED ORDER — HEPARIN SOD (PORK) LOCK FLUSH 100 UNIT/ML IV SOLN
500.0000 [IU] | Freq: Once | INTRAVENOUS | Status: AC | PRN
  Administered 2019-07-30: 500 [IU]
  Filled 2019-07-30: qty 5

## 2019-07-30 MED ORDER — SODIUM CHLORIDE 0.9% FLUSH
10.0000 mL | Freq: Once | INTRAVENOUS | Status: AC
  Administered 2019-07-30: 10 mL
  Filled 2019-07-30: qty 10

## 2019-07-30 MED ORDER — SODIUM CHLORIDE 0.9 % IV SOLN
Freq: Once | INTRAVENOUS | Status: AC
  Administered 2019-07-30: 11:00:00 via INTRAVENOUS
  Filled 2019-07-30: qty 250

## 2019-07-30 MED ORDER — SODIUM CHLORIDE 0.9% FLUSH
10.0000 mL | INTRAVENOUS | Status: DC | PRN
  Administered 2019-07-30: 10 mL
  Filled 2019-07-30: qty 10

## 2019-07-30 NOTE — Patient Instructions (Signed)
[facility] Discharge Instructions for Patients Receiving Chemotherapy  Today you received the following chemotherapy agents: MVASI  To help prevent nausea and vomiting after your treatment, we encourage you to take your nausea medication as directed.   If you develop nausea and vomiting that is not controlled by your nausea medication, call the clinic.   BELOW ARE SYMPTOMS THAT SHOULD BE REPORTED IMMEDIATELY:  *FEVER GREATER THAN 100.5 F  *CHILLS WITH OR WITHOUT FEVER  NAUSEA AND VOMITING THAT IS NOT CONTROLLED WITH YOUR NAUSEA MEDICATION  *UNUSUAL SHORTNESS OF BREATH  *UNUSUAL BRUISING OR BLEEDING  TENDERNESS IN MOUTH AND THROAT WITH OR WITHOUT PRESENCE OF ULCERS  *URINARY PROBLEMS  *BOWEL PROBLEMS  UNUSUAL RASH Items with * indicate a potential emergency and should be followed up as soon as possible.  Feel free to call the clinic should you have any questions or concerns. The clinic phone number is [phone].  Please show the [place] at check-in to the Emergency Department and triage nurse.

## 2019-07-30 NOTE — Progress Notes (Signed)
Per Dr. Alvy Bimler, okay to treat with PLT 88.

## 2019-07-31 ENCOUNTER — Encounter: Payer: Self-pay | Admitting: Hematology and Oncology

## 2019-07-31 ENCOUNTER — Telehealth: Payer: Self-pay | Admitting: Hematology and Oncology

## 2019-07-31 DIAGNOSIS — R1900 Intra-abdominal and pelvic swelling, mass and lump, unspecified site: Secondary | ICD-10-CM | POA: Insufficient documentation

## 2019-07-31 LAB — CA 125: Cancer Antigen (CA) 125: 24.9 U/mL (ref 0.0–38.1)

## 2019-07-31 NOTE — Progress Notes (Signed)
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[place]) Clinically, she have no signs or symptoms to suggest cancer recurrence Her sensation that she is described could be related to rectal prolapse I recommend CT imaging for further evaluation and she agreed Her tumor marker is stable We will proceed with bevacizumab and I plan to see her back next week after CT imaging results are available  Pelvic mass She complained of sensation of pelvic mass, from what she described, sounds like rectal prolapse She was examined by GYN surgeon recently but no conclusive diagnosis was made I recommend CT imaging for further evaluation and she agreed We discussed general approach including surgery, pelvic floor exercises with physical therapist versus insertion of pessary We will review plan of care next week once results are available  Essential hypertension She is mildly hypertensive today although her blood pressure monitoring at home is satisfactory She has mild proteinuria We will consider reducing the dose of bevacizumab in the future   Orders Placed This Encounter  Procedures  . CT ABDOMEN PELVIS W CONTRAST    Standing Status:   Future    Standing Expiration Date:   07/29/2020    Order Specific Question:   If indicated for the ordered procedure, I authorize the administration of contrast media per Radiology protocol    Answer:   Yes    Order Specific Question:   Preferred imaging location?    Answer:   [facility]    Order Specific Question:   Radiology Contrast Protocol - do NOT remove file path    Answer:   [email or web address]    INTERVAL HISTORY: Please see below for problem oriented charting. She returns for further follow-up She have sensation of pelvic mass especially when she is straining for bowel movement She denies  recent constipation but does have occasional bowel leakage She denies vaginal bleeding or discharge She was examined by GYN surgeon a week ago without any conclusive findings but she was told that there is possibility she might have rectal prolapse Her gynecologist has retired She is wondering about future pelvic examination and also the role of GYN follow-up She is inquiring about influenza vaccination Her blood pressure monitoring at home appears to be satisfactory She denies nausea or abdominal bloating  SUMMARY OF ONCOLOGIC HISTORY: Oncology History Overview Note  Neg genetics. 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 In a postmenopausal female, consider continued annual imaging followup with CT or MRI versus surgical evaluation.  Colonic diverticulosis. No radiographic evidence of diverticulitis.  Cholelithiasis.  No radiographic evidence of cholecystitis.  Small hiatal hernia.   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  The tumor involves serosal surface of the right ovary and adjacent right fallopian tube.  Cervix and endometrium were within normal limits. ER positive Peritoneal washing was positive.   07/30/2015 Surgery   SHe underwent laparoscopic-assisted total vaginal hysterectomy, bilateral salpingo-oophorectomy   07/30/2015 Pathology Results   PERITONEAL WASHING PELVIC (SPECIMEN 1 OF 1, COLLECTED ON 07/30/2015): MALIGNANT CELLS CONSISTENT WITH  HIGH GRADE CARCINOMA   08/13/2015 Tumor Marker   Patient's tumor was tested for the following markers: CA-125 Results of the tumor marker test revealed  96   08/25/2015 - 12/08/2015 Chemotherapy   The patient had 6 cycles of carboplatin and taxol   09/07/2015 Tumor Marker   Patient's tumor was tested for the following markers: CA-125 Results of the tumor marker test revealed 51   10/05/2015 Tumor Marker   Patient's tumor was tested for the following markers: CA-125 Results of the tumor marker test revealed 29   11/09/2015 Tumor Marker   Patient's tumor was tested for the following markers: CA-125 Results of the tumor marker test revealed 44   12/08/2015 Tumor Marker   Patient's tumor was tested for the following markers: CA-125 Results of the tumor marker test revealed 30   12/15/2015 Genetic Testing   Genetics testing normal by [facility] Breast Ovarian panel 12-15-15   01/11/2016 Imaging   1. The only finding of note are several adjacent peripheral abnormal hypodensities along the anterior superior spleen margin, differential diagnostic considerations including small peripheral interval splenic infarcts, splenic injury with subcapsular a fluid collections, or less likely metastatic disease to the splenic margin. This likely merits observation. 2. Other imaging findings of potential clinical significance: Small type 1 hiatal hernia. Aortoiliac atherosclerotic vascular disease. Lower lumbar spondylosis and degenerative disc disease. Gallstones with potential mild gallbladder wall thickening.   03/07/2016 Tumor Marker   Patient's tumor was tested for the following markers: CA-125 Results of the tumor marker test revealed 24.2   04/06/2016 Imaging   1. No evidence of a ovarian cancer metastasis. 2. No ascites. 3. Post hysterectomy and oophorectomy. 4. Cholelithiasis with several large gallstones   04/06/2016 Tumor Marker   Patient's tumor was tested for the following markers: CA-125 Results of the tumor marker test revealed 22.8   05/30/2016 Tumor Marker   Patient's tumor was tested for the following markers: CA-125 Results of the tumor  marker test revealed 21   09/07/2016 Tumor Marker   Patient's tumor was tested for the following markers: CA-125 Results of the tumor marker test revealed 22.4   11/28/2016 Tumor Marker   Patient's tumor was tested for the following markers: CA-125 Results of the tumor marker test revealed 18.8   02/23/2017 Tumor Marker   Patient's tumor was tested for the following markers: CA-125 Results of the tumor marker test revealed 19.1   06/02/2017 Tumor Marker   Patient's tumor was tested for the following markers: CA-125 Results of the tumor marker test revealed 18.3   08/24/2017 Mammogram   Pt reports mammogram complete   08/28/2017 Tumor Marker   Patient's tumor was tested for the following markers: CA-125 Results of the tumor marker test revealed 21.1   12/01/2017 Tumor Marker   Patient's tumor was tested for the following markers: CA-125 Results of the tumor marker test revealed 31.2   12/13/2017 Imaging   New 2.7 cm peritoneal soft tissue mass in anterior left lower quadrant, consistent with metastatic disease.  No other sites of metastatic disease identified.  Incidental findings including: Cholelithiasis. Colonic diverticulosis. Tiny hiatal hernia. Aortic atherosclerosis.   01/09/2018 - 05/07/2018 Chemotherapy   The patient had carboplatin and taxol; After 2nd cycle, she developed carboplatin allergy. She received carboplatin desensitization protocol at [facility] for final 4 cycles, completed by 6/17. Avastin was added from 04/16/18 onwards   01/30/2018 Adverse Reaction   Potential carboplatin allergy is suspected. She received half the dose of prescribed carboplatin on  cycle 2   05/31/2018 Tumor Marker   Patient's tumor was tested for the following markers: CA-125 Results of the tumor marker test revealed 26   05/31/2018 Imaging   1. Peritoneal soft tissue nodule identified on the previous study has decreased in the interval the. No progressive findings in the abdomen or pelvis on  today's study to suggest disease progression. 2. Tiny pulmonary nodules, likely benign. Attention on follow-up recommended. 3. Cholelithiasis. 4.  Aortic Atherosclerois (ICD10-170.0) 5. Tiny hiatal hernia.     06/04/2018 -  Chemotherapy   The patient is placed on Avastin for maintenance   06/25/2018 Tumor Marker   Patient's tumor was tested for the following markers: CA-125 Results of the tumor marker test revealed 22.5   08/06/2018 Tumor Marker   Patient's tumor was tested for the following markers: CA-125 Results of the tumor marker test revealed 20.7   09/14/2018 Imaging   Status post hysterectomy and bilateral salpingo-oophorectomy.  Stable soft tissue nodule beneath the left lower anterior abdominal wall, likely reflecting stable peritoneal disease.  Scattered small subpleural nodules in the lungs bilaterally, measuring up to 3 mm, technically indeterminate although likely benign. Please note that Fleischner Society guidelines do not apply. Attention on follow-up is suggested.  No evidence of new/progressive metastatic disease.   09/14/2018 Tumor Marker   Patient's tumor was tested for the following markers: CA-125 Results of the tumor marker test revealed 21.3   10/30/2018 Tumor Marker   Patient's tumor was tested for the following markers: CA-125 Results of the tumor marker test revealed 20.8   12/12/2018 Tumor Marker   Patient's tumor was tested for the following markers: CA-125 Results of the tumor marker test revealed 19.7   01/01/2019 Tumor Marker   Patient's tumor was tested for the following markers: CA-125 Results of the tumor marker test revealed 21.6   01/22/2019 Tumor Marker   Patient's tumor was tested for the following markers: CA-125 Results of the tumor marker test revealed 21.3   02/12/2019 Tumor Marker   Patient's tumor was tested for the following markers: CA-125 Results of the tumor marker test revealed 21.6   04/16/2019 Tumor Marker   Patient's  tumor was tested for the following markers: CA-125 Results of the tumor marker test revealed 21.7   05/07/2019 Tumor Marker   Patient's tumor was tested for the following markers: CA-125 Results of the tumor marker test revealed 27.3   05/28/2019 Tumor Marker   Patient's tumor was tested for the following markers: CA-125 Results of the tumor marker test revealed 24.5   07/09/2019 -  Chemotherapy   The patient had bevacizumab-awwb (MVASI) 900 mg in sodium chloride 0.9 % 100 mL chemo infusion, 900 mg (100 % of original dose 900 mg), Intravenous,  Once, 2 of 4 cycles Dose modification: 900 mg (original dose 900 mg, Cycle 1) Administration: 900 mg (07/09/2019), 900 mg (07/30/2019)  for chemotherapy treatment.    07/09/2019 Tumor Marker   Patient's tumor was tested for the following markers: CA-125 Results of the tumor marker test revealed 23.3.     REVIEW OF SYSTEMS:   Constitutional: Denies fevers, chills or abnormal weight loss Eyes: Denies blurriness of vision Ears, nose, mouth, throat, and face: Denies mucositis or sore throat Respiratory: Denies cough, dyspnea or wheezes Cardiovascular: Denies palpitation, chest discomfort or lower extremity swelling Gastrointestinal:  Denies nausea, heartburn or change in bowel habits Skin: Denies abnormal skin rashes Lymphatics: Denies new lymphadenopathy or easy bruising Neurological:Denies numbness, tingling or new weaknesses Behavioral/Psych:  Mood is stable, no new changes  All other systems were reviewed with the patient and are negative.  I have reviewed the past medical history, past surgical history, social history and family history with the patient and they are unchanged from previous note.  ALLERGIES:  is allergic to carboplatin.  MEDICATIONS:  Current Outpatient Medications  Medication Sig Dispense Refill  . amLODipine (NORVASC) 10 MG tablet Take 1 tablet (10 mg total) by mouth daily. 90 tablet 3  . atenolol (TENORMIN) 25 MG tablet  Take 0.5 tablets (12.5 mg total) by mouth daily. 30 tablet 9  . atorvastatin (LIPITOR) 10 MG tablet Take 1 tablet (10 mg total) by mouth daily. 30 tablet 2  . Coenzyme Q10 (CO Q 10) 100 MG CAPS Take 1 capsule by mouth daily.     . Glucosamine-Chondroit-Vit C-Mn (GLUCOSAMINE 1500 COMPLEX PO) Take 1 capsule by mouth 2 (two) times daily.    . hydrochlorothiazide (HYDRODIURIL) 12.5 MG tablet TAKE THREE TABLETS BY MOUTH DAILY 90 tablet 0  . lidocaine-prilocaine (EMLA) cream Apply to affected area once 30 g 3  . lisinopril (ZESTRIL) 30 MG tablet Take 1 tablet (30 mg total) by mouth daily. 90 tablet 11  . loratadine (CLARITIN) 10 MG tablet Take 10 mg by mouth daily as needed for allergies (hives).    . Multiple Vitamin (MULTIVITAMIN) capsule Take 1 capsule by mouth daily.     . Omega-3 Fatty Acids (OMEGA-3 FISH OIL) 300 MG CAPS Take 1 capsule by mouth daily.     . ondansetron (ZOFRAN) 8 MG tablet Take 1 tablet (8 mg total) by mouth 2 (two) times daily as needed for refractory nausea / vomiting. Start on day 3 after chemo. 30 tablet 1  . Polyethyl Glycol-Propyl Glycol (SYSTANE ULTRA OP) Apply 1 drop to eye 2 (two) times daily at 10 AM and 5 PM.    . Probiotic Product (PROBIOTIC ADVANCED PO) Take 1 capsule by mouth daily.    . prochlorperazine (COMPAZINE) 10 MG tablet Take 1 tablet (10 mg total) by mouth every 6 (six) hours as needed (Nausea or vomiting). 60 tablet 1  . vitamin E 400 UNIT capsule Take 400 Units by mouth every evening.      No current facility-administered medications for this visit.     PHYSICAL EXAMINATION: ECOG PERFORMANCE STATUS: 1 - Symptomatic but completely ambulatory  Vitals:   07/30/19 0946  BP: (!) 158/56  Pulse: 64  Resp: 18  Temp: 98 F (36.7 C)  SpO2: 100%   Filed Weights   07/30/19 0946  Weight: 142 lb 3.2 oz (64.5 kg)    GENERAL:alert, no distress and comfortable SKIN: skin color, texture, turgor are normal, no rashes or significant lesions EYES: normal,  Conjunctiva are pink and non-injected, sclera clear OROPHARYNX:no exudate, no erythema and lips, buccal mucosa, and tongue normal  NECK: supple, thyroid normal size, non-tender, without nodularity LYMPH:  no palpable lymphadenopathy in the cervical, axillary or inguinal LUNGS: clear to auscultation and percussion with normal breathing effort HEART: regular rate & rhythm and no murmurs and no lower extremity edema ABDOMEN:abdomen soft, non-tender and normal bowel sounds Musculoskeletal:no cyanosis of digits and no clubbing  NEURO: alert & oriented x 3 with fluent speech, no focal motor/sensory deficits  LABORATORY DATA:  I have reviewed the data as listed    Component Value Date/Time   NA 140 07/30/2019 0916   NA 142 11/28/2016 0916   K 3.9 07/30/2019 0916   K 3.9 11/28/2016 0916  CL 109 07/30/2019 0916   CO2 22 07/30/2019 0916   CO2 28 11/28/2016 0916   GLUCOSE 89 07/30/2019 0916   GLUCOSE 78 11/28/2016 0916   BUN 21 07/30/2019 0916   BUN 16.8 11/28/2016 0916   CREATININE 0.87 07/30/2019 0916   CREATININE 1.15 (H) 05/28/2019 1345   CREATININE 0.8 11/28/2016 0916   CALCIUM 9.4 07/30/2019 0916   CALCIUM 10.5 (H) 11/28/2016 0916   PROT 6.6 07/30/2019 0916   PROT 7.3 11/28/2016 0916   ALBUMIN 4.0 07/30/2019 0916   ALBUMIN 4.3 11/28/2016 0916   AST 26 07/30/2019 0916   AST 23 05/28/2019 1345   AST 19 11/28/2016 0916   ALT 13 07/30/2019 0916   ALT 14 05/28/2019 1345   ALT 19 11/28/2016 0916   ALKPHOS 49 07/30/2019 0916   ALKPHOS 50 11/28/2016 0916   BILITOT 0.5 07/30/2019 0916   BILITOT 0.3 05/28/2019 1345   BILITOT 0.48 11/28/2016 0916   GFRNONAA >60 07/30/2019 0916   GFRNONAA 46 (L) 05/28/2019 1345   GFRAA >60 07/30/2019 0916   GFRAA 53 (L) 05/28/2019 1345    No results found for: SPEP, UPEP  Lab Results  Component Value Date   WBC 4.3 07/30/2019   NEUTROABS 2.2 07/30/2019   HGB 13.1 07/30/2019   HCT 39.7 07/30/2019   MCV 92.5 07/30/2019   PLT 88 (L)  07/30/2019      Chemistry      Component Value Date/Time   NA 140 07/30/2019 0916   NA 142 11/28/2016 0916   K 3.9 07/30/2019 0916   K 3.9 11/28/2016 0916   CL 109 07/30/2019 0916   CO2 22 07/30/2019 0916   CO2 28 11/28/2016 0916   BUN 21 07/30/2019 0916   BUN 16.8 11/28/2016 0916   CREATININE 0.87 07/30/2019 0916   CREATININE 1.15 (H) 05/28/2019 1345   CREATININE 0.8 11/28/2016 0916      Component Value Date/Time   CALCIUM 9.4 07/30/2019 0916   CALCIUM 10.5 (H) 11/28/2016 0916   ALKPHOS 49 07/30/2019 0916   ALKPHOS 50 11/28/2016 0916   AST 26 07/30/2019 0916   AST 23 05/28/2019 1345   AST 19 11/28/2016 0916   ALT 13 07/30/2019 0916   ALT 14 05/28/2019 1345   ALT 19 11/28/2016 0916   BILITOT 0.5 07/30/2019 0916   BILITOT 0.3 05/28/2019 1345   BILITOT 0.48 11/28/2016 0916       All questions were answered. The patient knows to call the clinic with any problems, questions or concerns. No barriers to learning was detected.  I spent 25 minutes counseling the patient face to face. The total time spent in the appointment was 30 minutes and more than 50% was on counseling and review of test results  Heath Lark, MD 07/31/2019 8:25 AM

## 2019-07-31 NOTE — Telephone Encounter (Signed)
I talk with patient regarding reschedule due to ct scan

## 2019-07-31 NOTE — Assessment & Plan Note (Signed)
She is mildly hypertensive today although her blood pressure monitoring at home is satisfactory She has mild proteinuria We will consider reducing the dose of bevacizumab in the future

## 2019-07-31 NOTE — Assessment & Plan Note (Addendum)
She complained of sensation of pelvic mass, from what she described, sounds like rectal prolapse She was examined by GYN surgeon recently but no conclusive diagnosis was made I recommend CT imaging for further evaluation and she agreed We discussed general approach including surgery, pelvic floor exercises with physical therapist versus insertion of pessary We will review plan of care next week once results are available

## 2019-07-31 NOTE — Assessment & Plan Note (Addendum)
Clinically, she have no signs or symptoms to suggest cancer recurrence Her sensation that she is described could be related to rectal prolapse I recommend CT imaging for further evaluation and she agreed Her tumor marker is stable We will proceed with bevacizumab and I plan to see her back next week after CT imaging results are available

## 2019-08-06 ENCOUNTER — Encounter (HOSPITAL_COMMUNITY): Payer: Self-pay

## 2019-08-06 ENCOUNTER — Ambulatory Visit (HOSPITAL_COMMUNITY)
Admission: RE | Admit: 2019-08-06 | Discharge: 2019-08-06 | Disposition: A | Discharge status: Home / Self Care | Payer: Medicare Other | Source: Ambulatory Visit | Attending: Hematology and Oncology | Admitting: Hematology and Oncology

## 2019-08-06 ENCOUNTER — Ambulatory Visit: Payer: Medicare Other | Admitting: Hematology and Oncology

## 2019-08-06 ENCOUNTER — Other Ambulatory Visit: Payer: Self-pay

## 2019-08-06 DIAGNOSIS — C561 Malignant neoplasm of right ovary: Secondary | ICD-10-CM | POA: Insufficient documentation

## 2019-08-06 MED ORDER — HEPARIN SOD (PORK) LOCK FLUSH 100 UNIT/ML IV SOLN
INTRAVENOUS | Status: AC
  Filled 2019-08-06: qty 5

## 2019-08-06 MED ORDER — IOHEXOL 300 MG/ML  SOLN
30.0000 mL | Freq: Once | INTRAMUSCULAR | Status: AC
  Administered 2019-08-06: 30 mL via ORAL

## 2019-08-06 MED ORDER — HEPARIN SOD (PORK) LOCK FLUSH 100 UNIT/ML IV SOLN
500.0000 [IU] | Freq: Once | INTRAVENOUS | Status: AC
  Administered 2019-08-06: 14:00:00 500 [IU] via INTRAVENOUS

## 2019-08-06 MED ORDER — IOHEXOL 300 MG/ML  SOLN
100.0000 mL | Freq: Once | INTRAMUSCULAR | Status: AC | PRN
  Administered 2019-08-06: 14:00:00 100 mL via INTRAVENOUS

## 2019-08-06 MED ORDER — SODIUM CHLORIDE (PF) 0.9 % IJ SOLN
INTRAMUSCULAR | Status: AC
  Filled 2019-08-06: qty 50

## 2019-08-08 ENCOUNTER — Encounter: Payer: Self-pay | Admitting: Hematology and Oncology

## 2019-08-08 ENCOUNTER — Other Ambulatory Visit: Payer: Self-pay

## 2019-08-08 ENCOUNTER — Telehealth: Payer: Self-pay | Admitting: Oncology

## 2019-08-08 ENCOUNTER — Inpatient Hospital Stay: Payer: Medicare Other | Admitting: Hematology and Oncology

## 2019-08-08 DIAGNOSIS — Z7189 Other specified counseling: Secondary | ICD-10-CM | POA: Diagnosis not present

## 2019-08-08 DIAGNOSIS — C787 Secondary malignant neoplasm of liver and intrahepatic bile duct: Secondary | ICD-10-CM | POA: Insufficient documentation

## 2019-08-08 DIAGNOSIS — Z5112 Encounter for antineoplastic immunotherapy: Secondary | ICD-10-CM | POA: Diagnosis not present

## 2019-08-08 DIAGNOSIS — C561 Malignant neoplasm of right ovary: Secondary | ICD-10-CM | POA: Diagnosis not present

## 2019-08-08 NOTE — Assessment & Plan Note (Signed)
We have extensive goals of care discussion She has stage IV recurrent metastatic cancer Treatment goal is strictly palliative in nature

## 2019-08-08 NOTE — Assessment & Plan Note (Addendum)
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 This is happening despite normal tumor marker We have reviewed plan of care extensively The patient have carboplatinum allergy Previously, she was treated in 2019 at [facility] with carboplatin desensitization protocol She is platinum sensitive but she would not be able to get carboplatin if she were to be treated here We discussed combination chemotherapy versus single agent Some of the expected risks and side effects of carboplatin, paclitaxel, doxorubicin as well as gemcitabine were discussed The patient is undecided We discussed potential referral back to [facility] I recommend she discuss the plan of care with her family I plan to call her tomorrow morning for further follow-up I will get MSI testing ordered on her tissue biopsy from 2016 to see if she will qualify for pembrolizumab.

## 2019-08-08 NOTE — Progress Notes (Signed)
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 This is happening despite normal tumor marker We have reviewed plan of care extensively The patient have carboplatinum allergy Previously, she was treated in 2019 at [facility] with carboplatin desensitization protocol She is platinum sensitive but she would not be able to get carboplatin if she were to be treated here We discussed combination chemotherapy versus single agent Some of the expected risks and side effects of carboplatin, paclitaxel, doxorubicin as well as gemcitabine were discussed The patient is undecided We discussed potential referral back to [facility] I recommend she discuss the plan of care with her family I plan to call her tomorrow morning for further follow-up I will get MSI testing ordered on her tissue biopsy from 2016 to see if she will qualify for pembrolizumab.  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([place]) I explained to the patient why her liver disease is not resectable She is currently not symptomatic Her recent liver enzymes are within normal limits  Goals of care, counseling/discussion We have extensive goals of care discussion She has stage IV recurrent metastatic cancer Treatment goal is strictly palliative in nature   No orders of the defined types were placed in this encounter.   INTERVAL HISTORY: Please see below for problem oriented charting. She returns to review test results She denies recent abdominal pain  SUMMARY OF ONCOLOGIC HISTORY: Oncology History Overview Note  Neg genetics. Additional tests revealed ER: 80%, PR 3%    Right ovarian epithelial cancer ([facility])  07/01/2015 Initial Diagnosis   She presented with postmenopausal bleeding    07/02/2015 Imaging   US pelvis showed bilateral ovarian cyst   07/13/2015 Imaging   5.2 cm left adnexal cystic lesion, with indeterminate but probably benign characteristics. In a postmenopausal female, consider continued annual imaging followup with CT or MRI versus surgical evaluation.  Colonic diverticulosis. No radiographic evidence of diverticulitis.  Cholelithiasis.  No radiographic evidence of cholecystitis.  Small hiatal hernia.   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  The tumor involves serosal surface of the right ovary and adjacent right fallopian tube.  Cervix and endometrium were within normal limits. ER positive Peritoneal washing was positive.   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   08/13/2015 Tumor Marker   Patient's tumor was tested for the following markers: CA-125 Results of the tumor marker test revealed 96   08/25/2015 - 12/08/2015 Chemotherapy   The patient had 6 cycles of carboplatin and taxol   09/07/2015 Tumor Marker   Patient's tumor was tested for the following markers: CA-125 Results of the tumor marker test revealed 51   10/05/2015 Tumor Marker   Patient's tumor was tested for the following markers: CA-125 Results of the tumor marker test revealed 29   11/09/2015 Tumor Marker   Patient's tumor was tested for the following markers: CA-125 Results of the tumor marker test revealed 44   12/08/2015 Tumor Marker   Patient's tumor was tested for the following markers: CA-125 Results of the tumor marker test revealed  30   12/15/2015 Genetic Testing   Genetics testing normal by [facility] Breast Ovarian panel 12-15-15   01/11/2016 Imaging    1. The only finding of note are several adjacent peripheral abnormal hypodensities along the anterior superior spleen margin, differential diagnostic considerations including small peripheral interval splenic infarcts, splenic injury with subcapsular a fluid collections, or less likely metastatic disease to the splenic margin. This likely merits observation. 2. Other imaging findings of potential clinical significance: Small type 1 hiatal hernia. Aortoiliac atherosclerotic vascular disease. Lower lumbar spondylosis and degenerative disc disease. Gallstones with potential mild gallbladder wall thickening.   03/07/2016 Tumor Marker   Patient's tumor was tested for the following markers: CA-125 Results of the tumor marker test revealed 24.2   04/06/2016 Imaging   1. No evidence of a ovarian cancer metastasis. 2. No ascites. 3. Post hysterectomy and oophorectomy. 4. Cholelithiasis with several large gallstones   04/06/2016 Tumor Marker   Patient's tumor was tested for the following markers: CA-125 Results of the tumor marker test revealed 22.8   05/30/2016 Tumor Marker   Patient's tumor was tested for the following markers: CA-125 Results of the tumor marker test revealed 21   09/07/2016 Tumor Marker   Patient's tumor was tested for the following markers: CA-125 Results of the tumor marker test revealed 22.4   11/28/2016 Tumor Marker   Patient's tumor was tested for the following markers: CA-125 Results of the tumor marker test revealed 18.8   02/23/2017 Tumor Marker   Patient's tumor was tested for the following markers: CA-125 Results of the tumor marker test revealed 19.1   06/02/2017 Tumor Marker   Patient's tumor was tested for the following markers: CA-125 Results of the tumor marker test revealed 18.3   08/24/2017 Mammogram   Pt reports mammogram complete   08/28/2017 Tumor Marker   Patient's tumor was tested for the following markers: CA-125 Results of the tumor marker test  revealed 21.1   12/01/2017 Tumor Marker   Patient's tumor was tested for the following markers: CA-125 Results of the tumor marker test revealed 31.2   12/13/2017 Imaging   New 2.7 cm peritoneal soft tissue mass in anterior left lower quadrant, consistent with metastatic disease.  No other sites of metastatic disease identified.  Incidental findings including: Cholelithiasis. Colonic diverticulosis. Tiny hiatal hernia. Aortic atherosclerosis.   01/09/2018 - 05/07/2018 Chemotherapy   The patient had carboplatin and taxol; After 2nd cycle, she developed carboplatin allergy. She received carboplatin desensitization protocol at [facility] for final 4 cycles, completed by 6/17. Avastin was added from 04/16/18 onwards   01/30/2018 Adverse Reaction   Potential carboplatin allergy is suspected. She received half the dose of prescribed carboplatin on cycle 2   05/31/2018 Tumor Marker   Patient's tumor was tested for the following markers: CA-125 Results of the tumor marker test revealed 26   05/31/2018 Imaging   1. Peritoneal soft tissue nodule identified on the previous study has decreased in the interval the. No progressive findings in the abdomen or pelvis on today's study to suggest disease progression. 2. Tiny pulmonary nodules, likely benign. Attention on follow-up recommended. 3. Cholelithiasis. 4.  Aortic Atherosclerois (ICD10-170.0) 5. Tiny hiatal hernia.     06/04/2018 -  Chemotherapy   The patient is placed on Avastin for maintenance   06/25/2018 Tumor Marker   Patient's tumor was tested for the following markers: CA-125 Results of the tumor marker test revealed 22.5   08/06/2018 Tumor Marker   Patient's tumor was tested for the  following markers: CA-125 Results of the tumor marker test revealed 20.7   09/14/2018 Imaging   Status post hysterectomy and bilateral salpingo-oophorectomy.  Stable soft tissue nodule beneath the left lower anterior abdominal wall, likely reflecting stable  peritoneal disease.  Scattered small subpleural nodules in the lungs bilaterally, measuring up to 3 mm, technically indeterminate although likely benign. Please note that Fleischner Society guidelines do not apply. Attention on follow-up is suggested.  No evidence of new/progressive metastatic disease.   09/14/2018 Tumor Marker   Patient's tumor was tested for the following markers: CA-125 Results of the tumor marker test revealed 21.3   10/30/2018 Tumor Marker   Patient's tumor was tested for the following markers: CA-125 Results of the tumor marker test revealed 20.8   12/12/2018 Tumor Marker   Patient's tumor was tested for the following markers: CA-125 Results of the tumor marker test revealed 19.7   01/01/2019 Tumor Marker   Patient's tumor was tested for the following markers: CA-125 Results of the tumor marker test revealed 21.6   01/22/2019 Tumor Marker   Patient's tumor was tested for the following markers: CA-125 Results of the tumor marker test revealed 21.3   02/12/2019 Tumor Marker   Patient's tumor was tested for the following markers: CA-125 Results of the tumor marker test revealed 21.6   04/16/2019 Tumor Marker   Patient's tumor was tested for the following markers: CA-125 Results of the tumor marker test revealed 21.7   05/07/2019 Tumor Marker   Patient's tumor was tested for the following markers: CA-125 Results of the tumor marker test revealed 27.3   05/28/2019 Tumor Marker   Patient's tumor was tested for the following markers: CA-125 Results of the tumor marker test revealed 24.5   07/09/2019 - 08/19/2019 Chemotherapy   The patient had bevacizumab-awwb (MVASI) 900 mg in sodium chloride 0.9 % 100 mL chemo infusion, 900 mg (100 % of original dose 900 mg), Intravenous,  Once, 2 of 4 cycles Dose modification: 900 mg (original dose 900 mg, Cycle 1) Administration: 900 mg (07/09/2019), 900 mg (07/30/2019)  for chemotherapy treatment.    07/09/2019 Tumor Marker    Patient's tumor was tested for the following markers: CA-125 Results of the tumor marker test revealed 23.3.   08/06/2019 Imaging   Ct abdomen and pelvis 1. New hypodense lesions of the left lobe of the liver measuring 2.5 x 2.5 cm (series 2, image 16) and right lobe of the liver measuring 1.1 x 1.1 cm (series 2, image 23), concerning for metastatic disease.   2. Interval enlargement of an irregular nodule of the omentum measuring 1.0 x 1.0 cm, previously 1.0 x 0.5 cm (series 2, image 51), concerning for peritoneal disease.   3.  Status post hysterectomy.   4. Other chronic and incidental findings as detailed above. Aortic Atherosclerosis (ICD10-I70.0).     REVIEW OF SYSTEMS:   Constitutional: Denies fevers, chills or abnormal weight loss Eyes: Denies blurriness of vision Ears, nose, mouth, throat, and face: Denies mucositis or sore throat Respiratory: Denies cough, dyspnea or wheezes Cardiovascular: Denies palpitation, chest discomfort or lower extremity swelling Gastrointestinal:  Denies nausea, heartburn or change in bowel habits Skin: Denies abnormal skin rashes Lymphatics: Denies new lymphadenopathy or easy bruising Neurological:Denies numbness, tingling or new weaknesses Behavioral/Psych: Mood is stable, no new changes  All other systems were reviewed with the patient and are negative.  I have reviewed the past medical history, past surgical history, social history and family history with the patient and they  are unchanged from previous note.  ALLERGIES:  is allergic to carboplatin.  MEDICATIONS:  Current Outpatient Medications  Medication Sig Dispense Refill  . amLODipine (NORVASC) 10 MG tablet Take 1 tablet (10 mg total) by mouth daily. 90 tablet 3  . atenolol (TENORMIN) 25 MG tablet Take 0.5 tablets (12.5 mg total) by mouth daily. 30 tablet 9  . atorvastatin (LIPITOR) 10 MG tablet Take 1 tablet (10 mg total) by mouth daily. 30 tablet 2  . Coenzyme Q10 (CO Q 10) 100 MG  CAPS Take 1 capsule by mouth daily.     . Glucosamine-Chondroit-Vit C-Mn (GLUCOSAMINE 1500 COMPLEX PO) Take 1 capsule by mouth 2 (two) times daily.    . hydrochlorothiazide (HYDRODIURIL) 12.5 MG tablet TAKE THREE TABLETS BY MOUTH DAILY 90 tablet 0  . lidocaine-prilocaine (EMLA) cream Apply to affected area once 30 g 3  . lisinopril (ZESTRIL) 30 MG tablet Take 1 tablet (30 mg total) by mouth daily. 90 tablet 11  . loratadine (CLARITIN) 10 MG tablet Take 10 mg by mouth daily as needed for allergies (hives).    . Multiple Vitamin (MULTIVITAMIN) capsule Take 1 capsule by mouth daily.     . Omega-3 Fatty Acids (OMEGA-3 FISH OIL) 300 MG CAPS Take 1 capsule by mouth daily.     . ondansetron (ZOFRAN) 8 MG tablet Take 1 tablet (8 mg total) by mouth 2 (two) times daily as needed for refractory nausea / vomiting. Start on day 3 after chemo. 30 tablet 1  . Polyethyl Glycol-Propyl Glycol (SYSTANE ULTRA OP) Apply 1 drop to eye 2 (two) times daily at 10 AM and 5 PM.    . Probiotic Product (PROBIOTIC ADVANCED PO) Take 1 capsule by mouth daily.    . prochlorperazine (COMPAZINE) 10 MG tablet Take 1 tablet (10 mg total) by mouth every 6 (six) hours as needed (Nausea or vomiting). 60 tablet 1  . vitamin E 400 UNIT capsule Take 400 Units by mouth every evening.      No current facility-administered medications for this visit.     PHYSICAL EXAMINATION: ECOG PERFORMANCE STATUS: 0 - Asymptomatic  Vitals:   08/08/19 0957  BP: (!) 148/66  Pulse: 74  Resp: 20  Temp: 98.2 F (36.8 C)  SpO2: 100%   Filed Weights   08/08/19 0957  Weight: 137 lb 11.2 oz (62.5 kg)    GENERAL:alert, no distress and comfortable NEURO: alert & oriented x 3 with fluent speech, no focal motor/sensory deficits  LABORATORY DATA:  I have reviewed the data as listed    Component Value Date/Time   NA 140 07/30/2019 0916   NA 142 11/28/2016 0916   K 3.9 07/30/2019 0916   K 3.9 11/28/2016 0916   CL 109 07/30/2019 0916   CO2 22  07/30/2019 0916   CO2 28 11/28/2016 0916   GLUCOSE 89 07/30/2019 0916   GLUCOSE 78 11/28/2016 0916   BUN 21 07/30/2019 0916   BUN 16.8 11/28/2016 0916   CREATININE 0.87 07/30/2019 0916   CREATININE 1.15 (H) 05/28/2019 1345   CREATININE 0.8 11/28/2016 0916   CALCIUM 9.4 07/30/2019 0916   CALCIUM 10.5 (H) 11/28/2016 0916   PROT 6.6 07/30/2019 0916   PROT 7.3 11/28/2016 0916   ALBUMIN 4.0 07/30/2019 0916   ALBUMIN 4.3 11/28/2016 0916   AST 26 07/30/2019 0916   AST 23 05/28/2019 1345   AST 19 11/28/2016 0916   ALT 13 07/30/2019 0916   ALT 14 05/28/2019 1345   ALT 19 11/28/2016 0916  ALKPHOS 49 07/30/2019 0916   ALKPHOS 50 11/28/2016 0916   BILITOT 0.5 07/30/2019 0916   BILITOT 0.3 05/28/2019 1345   BILITOT 0.48 11/28/2016 0916   GFRNONAA >60 07/30/2019 0916   GFRNONAA 46 (L) 05/28/2019 1345   GFRAA >60 07/30/2019 0916   GFRAA 53 (L) 05/28/2019 1345    No results found for: SPEP, UPEP  Lab Results  Component Value Date   WBC 4.3 07/30/2019   NEUTROABS 2.2 07/30/2019   HGB 13.1 07/30/2019   HCT 39.7 07/30/2019   MCV 92.5 07/30/2019   PLT 88 (L) 07/30/2019      Chemistry      Component Value Date/Time   NA 140 07/30/2019 0916   NA 142 11/28/2016 0916   K 3.9 07/30/2019 0916   K 3.9 11/28/2016 0916   CL 109 07/30/2019 0916   CO2 22 07/30/2019 0916   CO2 28 11/28/2016 0916   BUN 21 07/30/2019 0916   BUN 16.8 11/28/2016 0916   CREATININE 0.87 07/30/2019 0916   CREATININE 1.15 (H) 05/28/2019 1345   CREATININE 0.8 11/28/2016 0916      Component Value Date/Time   CALCIUM 9.4 07/30/2019 0916   CALCIUM 10.5 (H) 11/28/2016 0916   ALKPHOS 49 07/30/2019 0916   ALKPHOS 50 11/28/2016 0916   AST 26 07/30/2019 0916   AST 23 05/28/2019 1345   AST 19 11/28/2016 0916   ALT 13 07/30/2019 0916   ALT 14 05/28/2019 1345   ALT 19 11/28/2016 0916   BILITOT 0.5 07/30/2019 0916   BILITOT 0.3 05/28/2019 1345   BILITOT 0.48 11/28/2016 0916       RADIOGRAPHIC STUDIES: I  have reviewed multiple imaging studies with the patient I have personally reviewed the radiological images as listed and agreed with the findings in the report. Ct Abdomen Pelvis W Contrast  Result Date: 08/06/2019 CLINICAL DATA:  Recurrent ovarian cancer, assess treatment response EXAM: CT ABDOMEN AND PELVIS WITH CONTRAST TECHNIQUE: Multidetector CT imaging of the abdomen and pelvis was performed using the standard protocol following bolus administration of intravenous contrast. CONTRAST:  175m OMNIPAQUE IOHEXOL 300 MG/ML SOLN, additional oral enteric contrast COMPARISON:  09/14/2018 FINDINGS: Lower chest: No acute abnormality.  Small hiatal hernia. Hepatobiliary: New hypodense lesions of the left lobe of the liver measuring 2.5 x 2.5 cm (series 2, image 16) and right lobe of the liver measuring 1.1 x 1.1 cm (series 2, image 23). Large rim calcified gallstones. No gallbladder wall thickening, or biliary dilatation. Pancreas: Unremarkable. No pancreatic ductal dilatation or surrounding inflammatory changes. Spleen: Normal in size without significant abnormality. Adrenals/Urinary Tract: Adrenal glands are unremarkable. Kidneys are normal, without renal calculi, solid lesion, or hydronephrosis. Bladder is unremarkable. Stomach/Bowel: Stomach is within normal limits. Appendix appears normal. No evidence of bowel wall thickening, distention, or inflammatory changes. Colonic diverticulosis. Vascular/Lymphatic: Aortic atherosclerosis. No enlarged abdominal or pelvic lymph nodes. Reproductive: Status post hysterectomy. Other: No abdominal wall hernia or abnormality. No abdominopelvic ascites. Interval enlargement of an irregular nodule of the omentum measuring 1.0 x 1.0 cm, previously 1.0 x 0.5 cm (series 2, image 51). Musculoskeletal: No acute or significant osseous findings. IMPRESSION: 1. New hypodense lesions of the left lobe of the liver measuring 2.5 x 2.5 cm (series 2, image 16) and right lobe of the liver  measuring 1.1 x 1.1 cm (series 2, image 23), concerning for metastatic disease. 2. Interval enlargement of an irregular nodule of the omentum measuring 1.0 x 1.0 cm, previously 1.0 x 0.5 cm (series  2, image 19), concerning for peritoneal disease. 3.  Status post hysterectomy. 4. Other chronic and incidental findings as detailed above. Aortic Atherosclerosis (ICD10-I70.0). Electronically Signed   By: Eddie Candle M.D.   On: 08/06/2019 16:36    All questions were answered. The patient knows to call the clinic with any problems, questions or concerns. No barriers to learning was detected.  I spent 30 minutes counseling the patient face to face. The total time spent in the appointment was 40 minutes and more than 50% was on counseling and review of test results  Heath Lark, MD 08/08/2019 11:58 AM

## 2019-08-08 NOTE — Assessment & Plan Note (Signed)
I explained to the patient why her liver disease is not resectable She is currently not symptomatic Her recent liver enzymes are within normal limits

## 2019-08-08 NOTE — Telephone Encounter (Signed)
Called Robin with [facility] and requested MSI testing on Accession: [id number].

## 2019-08-09 ENCOUNTER — Other Ambulatory Visit: Payer: Self-pay | Admitting: Hematology and Oncology

## 2019-08-09 ENCOUNTER — Telehealth: Payer: Self-pay | Admitting: Hematology and Oncology

## 2019-08-09 ENCOUNTER — Encounter: Payer: Self-pay | Admitting: Hematology and Oncology

## 2019-08-09 NOTE — Progress Notes (Signed)
OFF PATHWAY REGIMEN - Ovarian  No Change  Continue With Treatment as Ordered.   [id number]:Bevacizumab 15 mg/kg q21d:   A cycle is every 21 days:     Bevacizumab   **Always confirm dose/schedule in your pharmacy ordering system**  Patient Characteristics: Recurrent or Progressive Disease, Maintenance Therapy Therapeutic Status: Recurrent or Progressive Disease AJCC T Category: T3 AJCC N Category: N0 AJCC M Category: M0 AJCC 8 Stage Grouping: Unknown BRCA Mutation Status: Absent Line of Therapy: Maintenance  Intent of Therapy: Non-Curative / Palliative Intent, Discussed with Patient

## 2019-08-09 NOTE — Telephone Encounter (Signed)
I reviewed the plan of care with the patient She has elected on single agent paclitaxel weekly I plan on each cycle on days 1 and 8, rest day 15 for cycle of every 21 days I am aware of her chronic thrombocytopenia and we will lower the threshold for her to receive treatment She will start treatment next week and I will see her back prior to day 8 of treatment The risks, benefits, side effects of paclitaxel were discussed with the patient and she agreed to proceed with the plan of care.

## 2019-08-09 NOTE — Progress Notes (Signed)
DISCONTINUE OFF PATHWAY REGIMEN - Ovarian   [id number]:Bevacizumab 15 mg/kg q21d:   A cycle is every 21 days:     Bevacizumab   **Always confirm dose/schedule in your pharmacy ordering system**  REASON: Disease Progression PRIOR TREATMENT: Off Pathway: Bevacizumab 15 mg/kg q21d TREATMENT RESPONSE: Progressive Disease (PD)  START OFF PATHWAY REGIMEN - Ovarian   [id number]:Paclitaxel 80 mg/m2 Weekly:   Administer weekly:     Paclitaxel   **Always confirm dose/schedule in your pharmacy ordering system**  Patient Characteristics: Recurrent or Progressive Disease, Third Line Therapy, Platinum Sensitive and >  6 Months Since Last Platinum Therapy, BRCA Mutation Absent/Unknown Therapeutic Status: Recurrent or Progressive Disease BRCA Mutation Status: Absent Line of Therapy: Third Line  Intent of Therapy: Non-Curative / Palliative Intent, Discussed with Patient

## 2019-08-11 ENCOUNTER — Encounter: Payer: Self-pay | Admitting: Hematology and Oncology

## 2019-08-12 ENCOUNTER — Telehealth: Payer: Self-pay | Admitting: Hematology and Oncology

## 2019-08-12 ENCOUNTER — Other Ambulatory Visit: Payer: Self-pay | Admitting: Hematology and Oncology

## 2019-08-12 DIAGNOSIS — Z7189 Other specified counseling: Secondary | ICD-10-CM

## 2019-08-12 DIAGNOSIS — C561 Malignant neoplasm of right ovary: Secondary | ICD-10-CM

## 2019-08-12 MED ORDER — PROCHLORPERAZINE MALEATE 10 MG PO TABS: 10.0000 mg | ORAL_TABLET | Freq: Four times a day (QID) | ORAL | 1 refills | Status: DC | PRN

## 2019-08-12 MED ORDER — ONDANSETRON HCL 8 MG PO TABS: 8.0000 mg | ORAL_TABLET | Freq: Three times a day (TID) | ORAL | 1 refills | Status: DC | PRN

## 2019-08-12 NOTE — Telephone Encounter (Signed)
I talk with patient regarding schedule  

## 2019-08-16 ENCOUNTER — Inpatient Hospital Stay: Payer: Medicare Other

## 2019-08-16 ENCOUNTER — Other Ambulatory Visit: Payer: Self-pay

## 2019-08-16 ENCOUNTER — Other Ambulatory Visit: Payer: Self-pay | Admitting: *Deleted

## 2019-08-16 VITALS — BP 116/59 | HR 59 | Temp 98.4°F | Resp 16

## 2019-08-16 DIAGNOSIS — C561 Malignant neoplasm of right ovary: Secondary | ICD-10-CM

## 2019-08-16 DIAGNOSIS — Z7189 Other specified counseling: Secondary | ICD-10-CM

## 2019-08-16 DIAGNOSIS — Z5112 Encounter for antineoplastic immunotherapy: Secondary | ICD-10-CM | POA: Diagnosis not present

## 2019-08-16 LAB — CBC WITH DIFFERENTIAL/PLATELET
Abs Immature Granulocytes: 0 10*3/uL (ref 0.00–0.07)
Basophils Absolute: 0.1 10*3/uL (ref 0.0–0.1)
Basophils Relative: 2 %
Eosinophils Absolute: 0.2 10*3/uL (ref 0.0–0.5)
Eosinophils Relative: 6 %
HCT: 38.8 % (ref 36.0–46.0)
Hemoglobin: 13.1 g/dL (ref 12.0–15.0)
Immature Granulocytes: 0 %
Lymphocytes Relative: 33 %
Lymphs Abs: 1.3 10*3/uL (ref 0.7–4.0)
MCH: 30.6 pg (ref 26.0–34.0)
MCHC: 33.8 g/dL (ref 30.0–36.0)
MCV: 90.7 fL (ref 80.0–100.0)
Monocytes Absolute: 0.6 10*3/uL (ref 0.1–1.0)
Monocytes Relative: 13 %
Neutro Abs: 1.9 10*3/uL (ref 1.7–7.7)
Neutrophils Relative %: 46 %
Platelets: 112 10*3/uL — ABNORMAL LOW (ref 150–400)
RBC: 4.28 MIL/uL (ref 3.87–5.11)
RDW: 13.3 % (ref 11.5–15.5)
WBC: 4.1 10*3/uL (ref 4.0–10.5)
nRBC: 0 % (ref 0.0–0.2)

## 2019-08-16 LAB — COMPREHENSIVE METABOLIC PANEL
ALT: 14 U/L (ref 0–44)
AST: 27 U/L (ref 15–41)
Albumin: 4.1 g/dL (ref 3.5–5.0)
Alkaline Phosphatase: 55 U/L (ref 38–126)
Anion gap: 7 (ref 5–15)
BUN: 48 mg/dL — ABNORMAL HIGH (ref 8–23)
CO2: 25 mmol/L (ref 22–32)
Calcium: 9.8 mg/dL (ref 8.9–10.3)
Chloride: 108 mmol/L (ref 98–111)
Creatinine, Ser: 1.22 mg/dL — ABNORMAL HIGH (ref 0.44–1.00)
GFR calc Af Amer: 49 mL/min — ABNORMAL LOW (ref >60)
GFR calc non Af Amer: 43 mL/min — ABNORMAL LOW (ref >60)
Glucose, Bld: 93 mg/dL (ref 70–99)
Potassium: 4.2 mmol/L (ref 3.5–5.1)
Sodium: 140 mmol/L (ref 135–145)
Total Bilirubin: 0.5 mg/dL (ref 0.3–1.2)
Total Protein: 6.9 g/dL (ref 6.5–8.1)

## 2019-08-16 MED ORDER — FAMOTIDINE IN NACL 20-0.9 MG/50ML-% IV SOLN
20.0000 mg | Freq: Once | INTRAVENOUS | Status: AC
  Administered 2019-08-16: 20 mg via INTRAVENOUS

## 2019-08-16 MED ORDER — SODIUM CHLORIDE 0.9% FLUSH
10.0000 mL | INTRAVENOUS | Status: DC | PRN
  Administered 2019-08-16: 10 mL
  Filled 2019-08-16: qty 10

## 2019-08-16 MED ORDER — FAMOTIDINE IN NACL 20-0.9 MG/50ML-% IV SOLN
INTRAVENOUS | Status: AC
  Filled 2019-08-16: qty 50

## 2019-08-16 MED ORDER — SODIUM CHLORIDE 0.9 % IV SOLN
Freq: Once | INTRAVENOUS | Status: AC
  Administered 2019-08-16: 11:00:00 via INTRAVENOUS
  Filled 2019-08-16: qty 250

## 2019-08-16 MED ORDER — HEPARIN SOD (PORK) LOCK FLUSH 100 UNIT/ML IV SOLN
500.0000 [IU] | Freq: Once | INTRAVENOUS | Status: AC | PRN
  Administered 2019-08-16: 500 [IU]
  Filled 2019-08-16: qty 5

## 2019-08-16 MED ORDER — SODIUM CHLORIDE 0.9% FLUSH
10.0000 mL | Freq: Once | INTRAVENOUS | Status: AC
  Administered 2019-08-16: 10:00:00 10 mL
  Filled 2019-08-16: qty 10

## 2019-08-16 MED ORDER — DIPHENHYDRAMINE HCL 50 MG/ML IJ SOLN
INTRAMUSCULAR | Status: AC
  Filled 2019-08-16: qty 1

## 2019-08-16 MED ORDER — DIPHENHYDRAMINE HCL 50 MG/ML IJ SOLN
25.0000 mg | Freq: Once | INTRAMUSCULAR | Status: AC
  Administered 2019-08-16: 25 mg via INTRAVENOUS

## 2019-08-16 MED ORDER — SODIUM CHLORIDE 0.9 % IV SOLN
20.0000 mg | Freq: Once | INTRAVENOUS | Status: AC
  Administered 2019-08-16: 20 mg via INTRAVENOUS
  Filled 2019-08-16: qty 20

## 2019-08-16 MED ORDER — SODIUM CHLORIDE 0.9 % IV SOLN
80.0000 mg/m2 | Freq: Once | INTRAVENOUS | Status: AC
  Administered 2019-08-16: 12:00:00 132 mg via INTRAVENOUS
  Filled 2019-08-16: qty 22

## 2019-08-16 NOTE — Progress Notes (Signed)
Per Pharmacy, patient has tolerated taxol infusion in the past.  No need to treat patient as first time Taxol today.

## 2019-08-16 NOTE — Patient Instructions (Addendum)
[facility] Discharge Instructions for Patients Receiving Chemotherapy  Today you received the following chemotherapy agents: Taxol.  To help prevent nausea and vomiting after your treatment, we encourage you to take your nausea medication as directed.   If you develop nausea and vomiting that is not controlled by your nausea medication, call the clinic.   BELOW ARE SYMPTOMS THAT SHOULD BE REPORTED IMMEDIATELY:  *FEVER GREATER THAN 100.5 F  *CHILLS WITH OR WITHOUT FEVER  NAUSEA AND VOMITING THAT IS NOT CONTROLLED WITH YOUR NAUSEA MEDICATION  *UNUSUAL SHORTNESS OF BREATH  *UNUSUAL BRUISING OR BLEEDING  TENDERNESS IN MOUTH AND THROAT WITH OR WITHOUT PRESENCE OF ULCERS  *URINARY PROBLEMS  *BOWEL PROBLEMS  UNUSUAL RASH Items with * indicate a potential emergency and should be followed up as soon as possible.  Feel free to call the clinic should you have any questions or concerns. The clinic phone number is [phone].  Please show the CHEMO ALERT CARD at check-in to the Emergency Department and triage nurse.  Paclitaxel injection What is this medicine? PACLITAXEL (PAK li TAX el) is a chemotherapy drug. It targets fast dividing cells, like cancer cells, and causes these cells to die. This medicine is used to treat ovarian cancer, breast cancer, lung cancer, Kaposi's sarcoma, and other cancers. This medicine may be used for other purposes; ask your health care provider or pharmacist if you have questions. COMMON BRAND NAME(S): Onxol, Taxol What should I tell my health care provider before I take this medicine? They need to know if you have any of these conditions:  history of irregular heartbeat  liver disease  low blood counts, like low white cell, platelet, or red cell counts  lung or breathing disease, like asthma  tingling of the fingers or toes, or other nerve disorder  an unusual or allergic reaction to paclitaxel, alcohol, polyoxyethylated castor  oil, other chemotherapy, other medicines, foods, dyes, or preservatives  pregnant or trying to get pregnant  breast-feeding How should I use this medicine? This drug is given as an infusion into a vein. It is administered in a hospital or clinic by a specially trained health care professional. Talk to your pediatrician regarding the use of this medicine in children. Special care may be needed. Overdosage: If you think you have taken too much of this medicine contact a poison control center or emergency room at once. NOTE: This medicine is only for you. Do not share this medicine with others. What if I miss a dose? It is important not to miss your dose. Call your doctor or health care professional if you are unable to keep an appointment. What may interact with this medicine? Do not take this medicine with any of the following medications:  disulfiram  metronidazole This medicine may also interact with the following medications:  antiviral medicines for hepatitis, HIV or AIDS  certain antibiotics like erythromycin and clarithromycin  certain medicines for fungal infections like ketoconazole and itraconazole  certain medicines for seizures like carbamazepine, phenobarbital, phenytoin  gemfibrozil  nefazodone  rifampin  St. John's wort This list may not describe all possible interactions. Give your health care provider a list of all the medicines, herbs, non-prescription drugs, or dietary supplements you use. Also tell them if you smoke, drink alcohol, or use illegal drugs. Some items may interact with your medicine. What should I watch for while using this medicine? Your condition will be monitored carefully while you are receiving this medicine. You will need important blood work   done while you are taking this medicine. This medicine can cause serious allergic reactions. To reduce your risk you will need to take other medicine(s) before treatment with this medicine. If you  experience allergic reactions like skin rash, itching or hives, swelling of the face, lips, or tongue, tell your doctor or health care professional right away. In some cases, you may be given additional medicines to help with side effects. Follow all directions for their use. This drug may make you feel generally unwell. This is not uncommon, as chemotherapy can affect healthy cells as well as cancer cells. Report any side effects. Continue your course of treatment even though you feel ill unless your doctor tells you to stop. Call your doctor or health care professional for advice if you get a fever, chills or sore throat, or other symptoms of a cold or flu. Do not treat yourself. This drug decreases your body's ability to fight infections. Try to avoid being around people who are sick. This medicine may increase your risk to bruise or bleed. Call your doctor or health care professional if you notice any unusual bleeding. Be careful brushing and flossing your teeth or using a toothpick because you may get an infection or bleed more easily. If you have any dental work done, tell your dentist you are receiving this medicine. Avoid taking products that contain aspirin, acetaminophen, ibuprofen, naproxen, or ketoprofen unless instructed by your doctor. These medicines may hide a fever. Do not become pregnant while taking this medicine. Women should inform their doctor if they wish to become pregnant or think they might be pregnant. There is a potential for serious side effects to an unborn child. Talk to your health care professional or pharmacist for more information. Do not breast-feed an infant while taking this medicine. Men are advised not to father a child while receiving this medicine. This product may contain alcohol. Ask your pharmacist or healthcare provider if this medicine contains alcohol. Be sure to tell all healthcare providers you are taking this medicine. Certain medicines, like metronidazole  and disulfiram, can cause an unpleasant reaction when taken with alcohol. The reaction includes flushing, headache, nausea, vomiting, sweating, and increased thirst. The reaction can last from 30 minutes to several hours. What side effects may I notice from receiving this medicine? Side effects that you should report to your doctor or health care professional as soon as possible:  allergic reactions like skin rash, itching or hives, swelling of the face, lips, or tongue  breathing problems  changes in vision  fast, irregular heartbeat  high or low blood pressure  mouth sores  pain, tingling, numbness in the hands or feet  signs of decreased platelets or bleeding - bruising, pinpoint red spots on the skin, black, tarry stools, blood in the urine  signs of decreased red blood cells - unusually weak or tired, feeling faint or lightheaded, falls  signs of infection - fever or chills, cough, sore throat, pain or difficulty passing urine  signs and symptoms of liver injury like dark yellow or brown urine; general ill feeling or flu-like symptoms; light-colored stools; loss of appetite; nausea; right upper belly pain; unusually weak or tired; yellowing of the eyes or skin  swelling of the ankles, feet, hands  unusually slow heartbeat Side effects that usually do not require medical attention (report to your doctor or health care professional if they continue or are bothersome):  diarrhea  hair loss  loss of appetite  muscle or joint pain    nausea, vomiting  pain, redness, or irritation at site where injected  tiredness This list may not describe all possible side effects. Call your doctor for medical advice about side effects. You may report side effects to FDA at 1-800-FDA-1088. Where should I keep my medicine? This drug is given in a hospital or clinic and will not be stored at home. NOTE: This sheet is a summary. It may not cover all possible information. If you have  questions about this medicine, talk to your doctor, pharmacist, or health care provider.  2020 [employer] Standard (2017-07-11 13:14:55)  

## 2019-08-19 ENCOUNTER — Telehealth: Payer: Self-pay | Admitting: *Deleted

## 2019-08-19 NOTE — Telephone Encounter (Signed)
Called pt to see how she did with her taxol treatment last fri.  She reports dry mouth & diarrhea.  She states cheese has helped the diarrhea.  We discussed increasing oral fluids & biotene & hard sour candies to help with dry mouth.  She states she is walking/exercising & denies any other c/o's.  Reminded to call with further questions/concerns & she states she will & appreciated call.

## 2019-08-19 NOTE — Telephone Encounter (Signed)
-----   Message from Jarvis Morgan, RN sent at 08/16/2019 11:36 AM EDT ----- Regarding: Chemo follow up Restarted Taxol since 2016,  Dr. Cletus Gash,  TB, RN

## 2019-08-20 ENCOUNTER — Other Ambulatory Visit: Payer: Medicare Other

## 2019-08-20 ENCOUNTER — Other Ambulatory Visit: Payer: Self-pay | Admitting: Hematology and Oncology

## 2019-08-20 ENCOUNTER — Ambulatory Visit: Payer: Medicare Other

## 2019-08-23 ENCOUNTER — Inpatient Hospital Stay: Payer: Medicare Other

## 2019-08-23 ENCOUNTER — Inpatient Hospital Stay (HOSPITAL_BASED_OUTPATIENT_CLINIC_OR_DEPARTMENT_OTHER): Payer: Medicare Other | Admitting: Hematology and Oncology

## 2019-08-23 ENCOUNTER — Other Ambulatory Visit: Payer: Self-pay

## 2019-08-23 ENCOUNTER — Inpatient Hospital Stay: Payer: Medicare Other | Attending: Gynecology

## 2019-08-23 ENCOUNTER — Encounter: Payer: Self-pay | Admitting: Hematology and Oncology

## 2019-08-23 DIAGNOSIS — Z5111 Encounter for antineoplastic chemotherapy: Secondary | ICD-10-CM | POA: Diagnosis present

## 2019-08-23 DIAGNOSIS — C561 Malignant neoplasm of right ovary: Secondary | ICD-10-CM

## 2019-08-23 DIAGNOSIS — I10 Essential (primary) hypertension: Secondary | ICD-10-CM | POA: Diagnosis not present

## 2019-08-23 DIAGNOSIS — D61818 Other pancytopenia: Secondary | ICD-10-CM

## 2019-08-23 DIAGNOSIS — Z7189 Other specified counseling: Secondary | ICD-10-CM

## 2019-08-23 LAB — COMPREHENSIVE METABOLIC PANEL
ALT: 25 U/L (ref 0–44)
AST: 28 U/L (ref 15–41)
Albumin: 3.8 g/dL (ref 3.5–5.0)
Alkaline Phosphatase: 51 U/L (ref 38–126)
Anion gap: 8 (ref 5–15)
BUN: 41 mg/dL — ABNORMAL HIGH (ref 8–23)
CO2: 24 mmol/L (ref 22–32)
Calcium: 9.6 mg/dL (ref 8.9–10.3)
Chloride: 107 mmol/L (ref 98–111)
Creatinine, Ser: 1.04 mg/dL — ABNORMAL HIGH (ref 0.44–1.00)
GFR calc Af Amer: >60 mL/min (ref >60)
GFR calc non Af Amer: 52 mL/min — ABNORMAL LOW (ref >60)
Glucose, Bld: 101 mg/dL — ABNORMAL HIGH (ref 70–99)
Potassium: 4.5 mmol/L (ref 3.5–5.1)
Sodium: 139 mmol/L (ref 135–145)
Total Bilirubin: 0.5 mg/dL (ref 0.3–1.2)
Total Protein: 6.7 g/dL (ref 6.5–8.1)

## 2019-08-23 LAB — CBC WITH DIFFERENTIAL/PLATELET
Abs Immature Granulocytes: 0.03 10*3/uL (ref 0.00–0.07)
Basophils Absolute: 0 10*3/uL (ref 0.0–0.1)
Basophils Relative: 1 %
Eosinophils Absolute: 0.1 10*3/uL (ref 0.0–0.5)
Eosinophils Relative: 4 %
HCT: 38.1 % (ref 36.0–46.0)
Hemoglobin: 12.7 g/dL (ref 12.0–15.0)
Immature Granulocytes: 1 %
Lymphocytes Relative: 36 %
Lymphs Abs: 1.2 10*3/uL (ref 0.7–4.0)
MCH: 30.7 pg (ref 26.0–34.0)
MCHC: 33.3 g/dL (ref 30.0–36.0)
MCV: 92 fL (ref 80.0–100.0)
Monocytes Absolute: 0.2 10*3/uL (ref 0.1–1.0)
Monocytes Relative: 7 %
Neutro Abs: 1.7 10*3/uL (ref 1.7–7.7)
Neutrophils Relative %: 51 %
Platelets: 118 10*3/uL — ABNORMAL LOW (ref 150–400)
RBC: 4.14 MIL/uL (ref 3.87–5.11)
RDW: 13.2 % (ref 11.5–15.5)
WBC: 3.4 10*3/uL — ABNORMAL LOW (ref 4.0–10.5)
nRBC: 0 % (ref 0.0–0.2)

## 2019-08-23 MED ORDER — HEPARIN SOD (PORK) LOCK FLUSH 100 UNIT/ML IV SOLN
500.0000 [IU] | Freq: Once | INTRAVENOUS | Status: AC | PRN
  Administered 2019-08-23: 15:00:00 500 [IU]
  Filled 2019-08-23: qty 5

## 2019-08-23 MED ORDER — FAMOTIDINE IN NACL 20-0.9 MG/50ML-% IV SOLN
INTRAVENOUS | Status: AC
  Filled 2019-08-23: qty 50

## 2019-08-23 MED ORDER — SODIUM CHLORIDE 0.9% FLUSH
10.0000 mL | INTRAVENOUS | Status: DC | PRN
  Administered 2019-08-23: 10 mL
  Filled 2019-08-23: qty 10

## 2019-08-23 MED ORDER — DIPHENHYDRAMINE HCL 50 MG/ML IJ SOLN
25.0000 mg | Freq: Once | INTRAMUSCULAR | Status: AC
  Administered 2019-08-23: 25 mg via INTRAVENOUS

## 2019-08-23 MED ORDER — SODIUM CHLORIDE 0.9 % IV SOLN
Freq: Once | INTRAVENOUS | Status: AC
  Administered 2019-08-23: 12:00:00 via INTRAVENOUS
  Filled 2019-08-23: qty 250

## 2019-08-23 MED ORDER — SODIUM CHLORIDE 0.9% FLUSH
10.0000 mL | Freq: Once | INTRAVENOUS | Status: AC
  Administered 2019-08-23: 10 mL
  Filled 2019-08-23: qty 10

## 2019-08-23 MED ORDER — FAMOTIDINE IN NACL 20-0.9 MG/50ML-% IV SOLN
20.0000 mg | Freq: Once | INTRAVENOUS | Status: AC
  Administered 2019-08-23: 20 mg via INTRAVENOUS

## 2019-08-23 MED ORDER — SODIUM CHLORIDE 0.9 % IV SOLN
80.0000 mg/m2 | Freq: Once | INTRAVENOUS | Status: AC
  Administered 2019-08-23: 132 mg via INTRAVENOUS
  Filled 2019-08-23: qty 22

## 2019-08-23 MED ORDER — DIPHENHYDRAMINE HCL 50 MG/ML IJ SOLN
INTRAMUSCULAR | Status: AC
  Filled 2019-08-23: qty 1

## 2019-08-23 MED ORDER — SODIUM CHLORIDE 0.9 % IV SOLN
20.0000 mg | Freq: Once | INTRAVENOUS | Status: AC
  Administered 2019-08-23: 20 mg via INTRAVENOUS
  Filled 2019-08-23: qty 2

## 2019-08-23 NOTE — Assessment & Plan Note (Signed)
She is currently not symptomatic We will observe closely We discussed neutropenic precaution

## 2019-08-23 NOTE — Assessment & Plan Note (Signed)
Her blood pressure is improving since discontinuation of bevacizumab We will start slow blood pressure medication taper

## 2019-08-23 NOTE — Patient Instructions (Signed)

## 2019-08-23 NOTE — Progress Notes (Signed)
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[facility]) She tolerated chemotherapy very well without major side effects We will continue the same with 2 weeks on, one-week off schedule We will continue treatment for about 3 months before repeat imaging study  Pancytopenia, acquired [facility]) She is currently not symptomatic We will observe closely We discussed neutropenic precaution  Essential hypertension Her blood pressure is improving since discontinuation of bevacizumab We will start slow blood pressure medication taper   No orders of the defined types were placed in this encounter.   INTERVAL HISTORY: Please see below for problem oriented charting. She returns to be seen prior to treatment #2 of Taxol She tolerated treatment well so far Denies recent infection, fever or chills No peripheral neuropathy She has mild dry mouth  SUMMARY OF ONCOLOGIC HISTORY: Oncology History Overview Note  Neg genetics. Additional tests revealed ER: 80%, PR 3% MSI stable   Right ovarian epithelial cancer ([place])  07/01/2015 Initial Diagnosis   She presented with postmenopausal bleeding   07/02/2015 Imaging   US pelvis showed bilateral ovarian cyst   07/13/2015 Imaging   5.2 cm left adnexal cystic lesion, with indeterminate but probably benign characteristics. In a postmenopausal female, consider continued annual imaging followup with CT or MRI versus surgical evaluation.  Colonic diverticulosis. No radiographic evidence of diverticulitis.  Cholelithiasis.  No radiographic evidence of cholecystitis.  Small hiatal hernia.   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  The tumor involves serosal surface of the right ovary and adjacent right fallopian tube.  Cervix and endometrium  were within normal limits. ER positive Peritoneal washing was positive.   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   08/13/2015 Tumor Marker   Patient's tumor was tested for the following markers: CA-125 Results of the tumor marker test revealed 96   08/25/2015 - 12/08/2015 Chemotherapy   The patient had 6 cycles of carboplatin and taxol   09/07/2015 Tumor Marker   Patient's tumor was tested for the following markers: CA-125 Results of the tumor marker test revealed 51   10/05/2015 Tumor Marker   Patient's tumor was tested for the following markers: CA-125 Results of the tumor marker test revealed 29   11/09/2015 Tumor Marker   Patient's tumor was tested for the following markers: CA-125 Results of the tumor marker test revealed 44   12/08/2015 Tumor Marker   Patient's tumor was tested for the following markers: CA-125 Results of the tumor marker test revealed 30   12/15/2015 Genetic Testing   Genetics testing normal by [facility] Breast Ovarian panel 12-15-15   01/11/2016 Imaging   1. The only finding of note are several adjacent peripheral abnormal hypodensities along the anterior superior spleen margin, differential diagnostic considerations including small peripheral interval splenic infarcts, splenic injury with subcapsular a fluid collections, or less likely metastatic disease to the splenic margin. This likely merits observation. 2. Other imaging findings of potential clinical significance: Small type 1 hiatal hernia. Aortoiliac atherosclerotic vascular disease. Lower lumbar spondylosis and degenerative disc disease. Gallstones with potential mild gallbladder wall thickening.   03/07/2016 Tumor Marker   Patient's tumor was tested for the  following markers:  CA-125 Results of the tumor marker test revealed 24.2   04/06/2016 Imaging   1. No evidence of a ovarian cancer metastasis. 2. No ascites. 3. Post hysterectomy and oophorectomy. 4. Cholelithiasis with several large gallstones   04/06/2016 Tumor Marker   Patient's tumor was tested for the following markers: CA-125 Results of the tumor marker test revealed 22.8   05/30/2016 Tumor Marker   Patient's tumor was tested for the following markers: CA-125 Results of the tumor marker test revealed 21   09/07/2016 Tumor Marker   Patient's tumor was tested for the following markers: CA-125 Results of the tumor marker test revealed 22.4   11/28/2016 Tumor Marker   Patient's tumor was tested for the following markers: CA-125 Results of the tumor marker test revealed 18.8   02/23/2017 Tumor Marker   Patient's tumor was tested for the following markers: CA-125 Results of the tumor marker test revealed 19.1   06/02/2017 Tumor Marker   Patient's tumor was tested for the following markers: CA-125 Results of the tumor marker test revealed 18.3   08/24/2017 Mammogram   Pt reports mammogram complete   08/28/2017 Tumor Marker   Patient's tumor was tested for the following markers: CA-125 Results of the tumor marker test revealed 21.1   12/01/2017 Tumor Marker   Patient's tumor was tested for the following markers: CA-125 Results of the tumor marker test revealed 31.2   12/13/2017 Imaging   New 2.7 cm peritoneal soft tissue mass in anterior left lower quadrant, consistent with metastatic disease.  No other sites of metastatic disease identified.  Incidental findings including: Cholelithiasis. Colonic diverticulosis. Tiny hiatal hernia. Aortic atherosclerosis.   01/09/2018 - 05/07/2018 Chemotherapy   The patient had carboplatin and taxol; After 2nd cycle, she developed carboplatin allergy. She received carboplatin desensitization protocol at [facility] for final 4 cycles, completed by 6/17. Avastin was added  from 04/16/18 onwards   01/30/2018 Adverse Reaction   Potential carboplatin allergy is suspected. She received half the dose of prescribed carboplatin on cycle 2   05/31/2018 Tumor Marker   Patient's tumor was tested for the following markers: CA-125 Results of the tumor marker test revealed 26   05/31/2018 Imaging   1. Peritoneal soft tissue nodule identified on the previous study has decreased in the interval the. No progressive findings in the abdomen or pelvis on today's study to suggest disease progression. 2. Tiny pulmonary nodules, likely benign. Attention on follow-up recommended. 3. Cholelithiasis. 4.  Aortic Atherosclerois (ICD10-170.0) 5. Tiny hiatal hernia.     06/04/2018 -  Chemotherapy   The patient is placed on Avastin for maintenance   06/25/2018 Tumor Marker   Patient's tumor was tested for the following markers: CA-125 Results of the tumor marker test revealed 22.5   08/06/2018 Tumor Marker   Patient's tumor was tested for the following markers: CA-125 Results of the tumor marker test revealed 20.7   09/14/2018 Imaging   Status post hysterectomy and bilateral salpingo-oophorectomy.  Stable soft tissue nodule beneath the left lower anterior abdominal wall, likely reflecting stable peritoneal disease.  Scattered small subpleural nodules in the lungs bilaterally, measuring up to 3 mm, technically indeterminate although likely benign. Please note that Fleischner Society guidelines do not apply. Attention on follow-up is suggested.  No evidence of new/progressive metastatic disease.   09/14/2018 Tumor Marker   Patient's tumor was tested for the following markers: CA-125 Results of the tumor marker test revealed 21.3   10/30/2018 Tumor Marker   Patient's tumor was  tested for the following markers: CA-125 Results of the tumor marker test revealed 20.8   12/12/2018 Tumor Marker   Patient's tumor was tested for the following markers: CA-125 Results of the tumor  marker test revealed 19.7   01/01/2019 Tumor Marker   Patient's tumor was tested for the following markers: CA-125 Results of the tumor marker test revealed 21.6   01/22/2019 Tumor Marker   Patient's tumor was tested for the following markers: CA-125 Results of the tumor marker test revealed 21.3   02/12/2019 Tumor Marker   Patient's tumor was tested for the following markers: CA-125 Results of the tumor marker test revealed 21.6   04/16/2019 Tumor Marker   Patient's tumor was tested for the following markers: CA-125 Results of the tumor marker test revealed 21.7   05/07/2019 Tumor Marker   Patient's tumor was tested for the following markers: CA-125 Results of the tumor marker test revealed 27.3   05/28/2019 Tumor Marker   Patient's tumor was tested for the following markers: CA-125 Results of the tumor marker test revealed 24.5   07/09/2019 - 08/19/2019 Chemotherapy   The patient had bevacizumab for chemotherapy treatment.     07/09/2019 Tumor Marker   Patient's tumor was tested for the following markers: CA-125 Results of the tumor marker test revealed 23.3.   08/06/2019 Imaging   Ct abdomen and pelvis 1. New hypodense lesions of the left lobe of the liver measuring 2.5 x 2.5 cm (series 2, image 16) and right lobe of the liver measuring 1.1 x 1.1 cm (series 2, image 23), concerning for metastatic disease.   2. Interval enlargement of an irregular nodule of the omentum measuring 1.0 x 1.0 cm, previously 1.0 x 0.5 cm (series 2, image 51), concerning for peritoneal disease.   3.  Status post hysterectomy.   4. Other chronic and incidental findings as detailed above. Aortic Atherosclerosis (ICD10-I70.0).   08/16/2019 -  Chemotherapy   The patient had PACLitaxel (TAXOL) 132 mg in sodium chloride 0.9 % 250 mL chemo infusion (</= 5m/m2), 80 mg/m2 = 132 mg, Intravenous,  Once, 1 of 3 cycles Administration: 132 mg (08/16/2019)  for chemotherapy treatment.      REVIEW OF SYSTEMS:    Constitutional: Denies fevers, chills or abnormal weight loss Eyes: Denies blurriness of vision Ears, nose, mouth, throat, and face: Denies mucositis or sore throat Respiratory: Denies cough, dyspnea or wheezes Cardiovascular: Denies palpitation, chest discomfort or lower extremity swelling Gastrointestinal:  Denies nausea, heartburn or change in bowel habits Skin: Denies abnormal skin rashes Lymphatics: Denies new lymphadenopathy or easy bruising Neurological:Denies numbness, tingling or new weaknesses Behavioral/Psych: Mood is stable, no new changes  All other systems were reviewed with the patient and are negative.  I have reviewed the past medical history, past surgical history, social history and family history with the patient and they are unchanged from previous note.  ALLERGIES:  is allergic to carboplatin.  MEDICATIONS:  Current Outpatient Medications  Medication Sig Dispense Refill  . amLODipine (NORVASC) 10 MG tablet Take 1 tablet (10 mg total) by mouth daily. 90 tablet 3  . atenolol (TENORMIN) 25 MG tablet Take 0.5 tablets (12.5 mg total) by mouth daily. 30 tablet 9  . atorvastatin (LIPITOR) 10 MG tablet Take 1 tablet (10 mg total) by mouth daily. 30 tablet 2  . Coenzyme Q10 (CO Q 10) 100 MG CAPS Take 1 capsule by mouth daily.     . Glucosamine-Chondroit-Vit C-Mn (GLUCOSAMINE 1500 COMPLEX PO) Take 1 capsule by mouth  2 (two) times daily.    . hydrochlorothiazide (HYDRODIURIL) 12.5 MG tablet TAKE THREE TABLETS BY MOUTH DAILY 90 tablet 0  . lidocaine-prilocaine (EMLA) cream Apply to affected area once 30 g 3  . lisinopril (ZESTRIL) 30 MG tablet Take 1 tablet (30 mg total) by mouth daily. 90 tablet 11  . loratadine (CLARITIN) 10 MG tablet Take 10 mg by mouth daily as needed for allergies (hives).    . Multiple Vitamin (MULTIVITAMIN) capsule Take 1 capsule by mouth daily.     . Omega-3 Fatty Acids (OMEGA-3 FISH OIL) 300 MG CAPS Take 1 capsule by mouth daily.     . ondansetron  (ZOFRAN) 8 MG tablet Take 1 tablet (8 mg total) by mouth every 8 (eight) hours as needed for refractory nausea / vomiting. 30 tablet 1  . Polyethyl Glycol-Propyl Glycol (SYSTANE ULTRA OP) Apply 1 drop to eye 2 (two) times daily at 10 AM and 5 PM.    . Probiotic Product (PROBIOTIC ADVANCED PO) Take 1 capsule by mouth daily.    . prochlorperazine (COMPAZINE) 10 MG tablet Take 1 tablet (10 mg total) by mouth every 6 (six) hours as needed (Nausea or vomiting). 60 tablet 1  . vitamin E 400 UNIT capsule Take 400 Units by mouth every evening.      No current facility-administered medications for this visit.    Facility-Administered Medications Ordered in Other Visits  Medication Dose Route Frequency Provider Last Rate Last Dose  . heparin lock flush 100 unit/mL  500 Units Intracatheter Once PRN Alvy Bimler, Kinslee Dalpe, MD      . PACLitaxel (TAXOL) 132 mg in sodium chloride 0.9 % 250 mL chemo infusion (</= 43m/m2)  80 mg/m2 (Treatment Plan Recorded) Intravenous Once GAlvy Bimler Starlynn Klinkner, MD      . sodium chloride flush (NS) 0.9 % injection 10 mL  10 mL Intracatheter PRN GAlvy Bimler Curly Mackowski, MD        PHYSICAL EXAMINATION: ECOG PERFORMANCE STATUS: 1 - Symptomatic but completely ambulatory  Vitals:   08/23/19 1144  BP: 138/68  Pulse: 67  Resp: 17  Temp: 99.1 F (37.3 C)  SpO2: 100%   Filed Weights   08/23/19 1144  Weight: 134 lb 14.4 oz (61.2 kg)    GENERAL:alert, no distress and comfortable SKIN: skin color, texture, turgor are normal, no rashes or significant lesions EYES: normal, Conjunctiva are pink and non-injected, sclera clear OROPHARYNX:no exudate, no erythema and lips, buccal mucosa, and tongue normal  NECK: supple, thyroid normal size, non-tender, without nodularity LYMPH:  no palpable lymphadenopathy in the cervical, axillary or inguinal LUNGS: clear to auscultation and percussion with normal breathing effort HEART: regular rate & rhythm and no murmurs and no lower extremity edema ABDOMEN:abdomen soft,  non-tender and normal bowel sounds Musculoskeletal:no cyanosis of digits and no clubbing  NEURO: alert & oriented x 3 with fluent speech, no focal motor/sensory deficits  LABORATORY DATA:  I have reviewed the data as listed    Component Value Date/Time   NA 139 08/23/2019 1030   NA 142 11/28/2016 0916   K 4.5 08/23/2019 1030   K 3.9 11/28/2016 0916   CL 107 08/23/2019 1030   CO2 24 08/23/2019 1030   CO2 28 11/28/2016 0916   GLUCOSE 101 (H) 08/23/2019 1030   GLUCOSE 78 11/28/2016 0916   BUN 41 (H) 08/23/2019 1030   BUN 16.8 11/28/2016 0916   CREATININE 1.04 (H) 08/23/2019 1030   CREATININE 1.15 (H) 05/28/2019 1345   CREATININE 0.8 11/28/2016 0916   CALCIUM  9.6 08/23/2019 1030   CALCIUM 10.5 (H) 11/28/2016 0916   PROT 6.7 08/23/2019 1030   PROT 7.3 11/28/2016 0916   ALBUMIN 3.8 08/23/2019 1030   ALBUMIN 4.3 11/28/2016 0916   AST 28 08/23/2019 1030   AST 23 05/28/2019 1345   AST 19 11/28/2016 0916   ALT 25 08/23/2019 1030   ALT 14 05/28/2019 1345   ALT 19 11/28/2016 0916   ALKPHOS 51 08/23/2019 1030   ALKPHOS 50 11/28/2016 0916   BILITOT 0.5 08/23/2019 1030   BILITOT 0.3 05/28/2019 1345   BILITOT 0.48 11/28/2016 0916   GFRNONAA 52 (L) 08/23/2019 1030   GFRNONAA 46 (L) 05/28/2019 1345   GFRAA >60 08/23/2019 1030   GFRAA 53 (L) 05/28/2019 1345    No results found for: SPEP, UPEP  Lab Results  Component Value Date   WBC 3.4 (L) 08/23/2019   NEUTROABS 1.7 08/23/2019   HGB 12.7 08/23/2019   HCT 38.1 08/23/2019   MCV 92.0 08/23/2019   PLT 118 (L) 08/23/2019      Chemistry      Component Value Date/Time   NA 139 08/23/2019 1030   NA 142 11/28/2016 0916   K 4.5 08/23/2019 1030   K 3.9 11/28/2016 0916   CL 107 08/23/2019 1030   CO2 24 08/23/2019 1030   CO2 28 11/28/2016 0916   BUN 41 (H) 08/23/2019 1030   BUN 16.8 11/28/2016 0916   CREATININE 1.04 (H) 08/23/2019 1030   CREATININE 1.15 (H) 05/28/2019 1345   CREATININE 0.8 11/28/2016 0916      Component  Value Date/Time   CALCIUM 9.6 08/23/2019 1030   CALCIUM 10.5 (H) 11/28/2016 0916   ALKPHOS 51 08/23/2019 1030   ALKPHOS 50 11/28/2016 0916   AST 28 08/23/2019 1030   AST 23 05/28/2019 1345   AST 19 11/28/2016 0916   ALT 25 08/23/2019 1030   ALT 14 05/28/2019 1345   ALT 19 11/28/2016 0916   BILITOT 0.5 08/23/2019 1030   BILITOT 0.3 05/28/2019 1345   BILITOT 0.48 11/28/2016 0916       RADIOGRAPHIC STUDIES: I have personally reviewed the radiological images as listed and agreed with the findings in the report. Ct Abdomen Pelvis W Contrast  Result Date: 08/06/2019 CLINICAL DATA:  Recurrent ovarian cancer, assess treatment response EXAM: CT ABDOMEN AND PELVIS WITH CONTRAST TECHNIQUE: Multidetector CT imaging of the abdomen and pelvis was performed using the standard protocol following bolus administration of intravenous contrast. CONTRAST:  141m OMNIPAQUE IOHEXOL 300 MG/ML SOLN, additional oral enteric contrast COMPARISON:  09/14/2018 FINDINGS: Lower chest: No acute abnormality.  Small hiatal hernia. Hepatobiliary: New hypodense lesions of the left lobe of the liver measuring 2.5 x 2.5 cm (series 2, image 16) and right lobe of the liver measuring 1.1 x 1.1 cm (series 2, image 23). Large rim calcified gallstones. No gallbladder wall thickening, or biliary dilatation. Pancreas: Unremarkable. No pancreatic ductal dilatation or surrounding inflammatory changes. Spleen: Normal in size without significant abnormality. Adrenals/Urinary Tract: Adrenal glands are unremarkable. Kidneys are normal, without renal calculi, solid lesion, or hydronephrosis. Bladder is unremarkable. Stomach/Bowel: Stomach is within normal limits. Appendix appears normal. No evidence of bowel wall thickening, distention, or inflammatory changes. Colonic diverticulosis. Vascular/Lymphatic: Aortic atherosclerosis. No enlarged abdominal or pelvic lymph nodes. Reproductive: Status post hysterectomy. Other: No abdominal wall hernia or  abnormality. No abdominopelvic ascites. Interval enlargement of an irregular nodule of the omentum measuring 1.0 x 1.0 cm, previously 1.0 x 0.5 cm (series 2, image 51). Musculoskeletal:  No acute or significant osseous findings. IMPRESSION: 1. New hypodense lesions of the left lobe of the liver measuring 2.5 x 2.5 cm (series 2, image 16) and right lobe of the liver measuring 1.1 x 1.1 cm (series 2, image 23), concerning for metastatic disease. 2. Interval enlargement of an irregular nodule of the omentum measuring 1.0 x 1.0 cm, previously 1.0 x 0.5 cm (series 2, image 51), concerning for peritoneal disease. 3.  Status post hysterectomy. 4. Other chronic and incidental findings as detailed above. Aortic Atherosclerosis (ICD10-I70.0). Electronically Signed   By: Eddie Candle M.D.   On: 08/06/2019 16:36    All questions were answered. The patient knows to call the clinic with any problems, questions or concerns. No barriers to learning was detected.  I spent 15 minutes counseling the patient face to face. The total time spent in the appointment was 20 minutes and more than 50% was on counseling and review of test results  Heath Lark, MD 08/23/2019 1:43 PM

## 2019-08-23 NOTE — Assessment & Plan Note (Signed)
She tolerated chemotherapy very well without major side effects We will continue the same with 2 weeks on, one-week off schedule We will continue treatment for about 3 months before repeat imaging study

## 2019-08-23 NOTE — Patient Instructions (Signed)
[facility] Discharge Instructions for Patients Receiving Chemotherapy  Today you received the following chemotherapy agents:  Taxol.  To help prevent nausea and vomiting after your treatment, we encourage you to take your nausea medication as directed.   If you develop nausea and vomiting that is not controlled by your nausea medication, call the clinic.   BELOW ARE SYMPTOMS THAT SHOULD BE REPORTED IMMEDIATELY:  *FEVER GREATER THAN 100.5 F  *CHILLS WITH OR WITHOUT FEVER  NAUSEA AND VOMITING THAT IS NOT CONTROLLED WITH YOUR NAUSEA MEDICATION  *UNUSUAL SHORTNESS OF BREATH  *UNUSUAL BRUISING OR BLEEDING  TENDERNESS IN MOUTH AND THROAT WITH OR WITHOUT PRESENCE OF ULCERS  *URINARY PROBLEMS  *BOWEL PROBLEMS  UNUSUAL RASH Items with * indicate a potential emergency and should be followed up as soon as possible.  Feel free to call the clinic should you have any questions or concerns. The clinic phone number is [phone].  Please show the CHEMO ALERT CARD at check-in to the Emergency Department and triage nurse.   

## 2019-08-24 LAB — CA 125: Cancer Antigen (CA) 125: 33.7 U/mL (ref 0.0–38.1)

## 2019-08-26 ENCOUNTER — Telehealth: Payer: Self-pay | Admitting: Hematology and Oncology

## 2019-08-26 NOTE — Telephone Encounter (Signed)
I left a message regarding schedule  

## 2019-08-28 ENCOUNTER — Encounter: Payer: Self-pay | Admitting: Hematology and Oncology

## 2019-09-05 ENCOUNTER — Encounter: Payer: Self-pay | Admitting: Hematology and Oncology

## 2019-09-06 ENCOUNTER — Other Ambulatory Visit: Payer: Self-pay | Admitting: Hematology and Oncology

## 2019-09-06 ENCOUNTER — Inpatient Hospital Stay: Payer: Medicare Other

## 2019-09-06 ENCOUNTER — Encounter: Payer: Self-pay | Admitting: Hematology and Oncology

## 2019-09-06 ENCOUNTER — Telehealth: Payer: Self-pay | Admitting: Hematology and Oncology

## 2019-09-06 ENCOUNTER — Inpatient Hospital Stay: Payer: Medicare Other | Admitting: Hematology and Oncology

## 2019-09-06 ENCOUNTER — Other Ambulatory Visit: Payer: Self-pay

## 2019-09-06 DIAGNOSIS — R238 Other skin changes: Secondary | ICD-10-CM

## 2019-09-06 DIAGNOSIS — I10 Essential (primary) hypertension: Secondary | ICD-10-CM | POA: Diagnosis not present

## 2019-09-06 DIAGNOSIS — C561 Malignant neoplasm of right ovary: Secondary | ICD-10-CM | POA: Diagnosis not present

## 2019-09-06 DIAGNOSIS — D61818 Other pancytopenia: Secondary | ICD-10-CM

## 2019-09-06 DIAGNOSIS — G620 Drug-induced polyneuropathy: Secondary | ICD-10-CM

## 2019-09-06 DIAGNOSIS — Z7189 Other specified counseling: Secondary | ICD-10-CM

## 2019-09-06 DIAGNOSIS — C787 Secondary malignant neoplasm of liver and intrahepatic bile duct: Secondary | ICD-10-CM

## 2019-09-06 DIAGNOSIS — T451X5A Adverse effect of antineoplastic and immunosuppressive drugs, initial encounter: Secondary | ICD-10-CM

## 2019-09-06 DIAGNOSIS — Z5111 Encounter for antineoplastic chemotherapy: Secondary | ICD-10-CM | POA: Diagnosis not present

## 2019-09-06 LAB — COMPREHENSIVE METABOLIC PANEL
ALT: 20 U/L (ref 0–44)
AST: 24 U/L (ref 15–41)
Albumin: 3.7 g/dL (ref 3.5–5.0)
Alkaline Phosphatase: 50 U/L (ref 38–126)
Anion gap: 10 (ref 5–15)
BUN: 28 mg/dL — ABNORMAL HIGH (ref 8–23)
CO2: 23 mmol/L (ref 22–32)
Calcium: 9.2 mg/dL (ref 8.9–10.3)
Chloride: 108 mmol/L (ref 98–111)
Creatinine, Ser: 0.89 mg/dL (ref 0.44–1.00)
GFR calc Af Amer: >60 mL/min (ref >60)
GFR calc non Af Amer: >60 mL/min (ref >60)
Glucose, Bld: 100 mg/dL — ABNORMAL HIGH (ref 70–99)
Potassium: 4.5 mmol/L (ref 3.5–5.1)
Sodium: 141 mmol/L (ref 135–145)
Total Bilirubin: 0.3 mg/dL (ref 0.3–1.2)
Total Protein: 6.6 g/dL (ref 6.5–8.1)

## 2019-09-06 LAB — CBC WITH DIFFERENTIAL/PLATELET
Abs Immature Granulocytes: 0 10*3/uL (ref 0.00–0.07)
Basophils Absolute: 0 10*3/uL (ref 0.0–0.1)
Basophils Relative: 1 %
Eosinophils Absolute: 0 10*3/uL (ref 0.0–0.5)
Eosinophils Relative: 2 %
HCT: 37.4 % (ref 36.0–46.0)
Hemoglobin: 12.2 g/dL (ref 12.0–15.0)
Immature Granulocytes: 0 %
Lymphocytes Relative: 36 %
Lymphs Abs: 0.9 10*3/uL (ref 0.7–4.0)
MCH: 30.3 pg (ref 26.0–34.0)
MCHC: 32.6 g/dL (ref 30.0–36.0)
MCV: 92.8 fL (ref 80.0–100.0)
Monocytes Absolute: 0.4 10*3/uL (ref 0.1–1.0)
Monocytes Relative: 17 %
Neutro Abs: 1.1 10*3/uL — ABNORMAL LOW (ref 1.7–7.7)
Neutrophils Relative %: 44 %
Platelets: 114 10*3/uL — ABNORMAL LOW (ref 150–400)
RBC: 4.03 MIL/uL (ref 3.87–5.11)
RDW: 13.8 % (ref 11.5–15.5)
WBC: 2.5 10*3/uL — ABNORMAL LOW (ref 4.0–10.5)
nRBC: 0 % (ref 0.0–0.2)

## 2019-09-06 MED ORDER — FAMOTIDINE IN NACL 20-0.9 MG/50ML-% IV SOLN
20.0000 mg | Freq: Once | INTRAVENOUS | Status: AC
  Administered 2019-09-06: 20 mg via INTRAVENOUS

## 2019-09-06 MED ORDER — SODIUM CHLORIDE 0.9 % IV SOLN
80.0000 mg/m2 | Freq: Once | INTRAVENOUS | Status: AC
  Administered 2019-09-06: 132 mg via INTRAVENOUS
  Filled 2019-09-06: qty 22

## 2019-09-06 MED ORDER — SODIUM CHLORIDE 0.9% FLUSH
10.0000 mL | Freq: Once | INTRAVENOUS | Status: AC
  Administered 2019-09-06: 10 mL
  Filled 2019-09-06: qty 10

## 2019-09-06 MED ORDER — DEXAMETHASONE SODIUM PHOSPHATE 10 MG/ML IJ SOLN
INTRAMUSCULAR | Status: AC
  Filled 2019-09-06: qty 1

## 2019-09-06 MED ORDER — SODIUM CHLORIDE 0.9 % IV SOLN
Freq: Once | INTRAVENOUS | Status: AC
  Administered 2019-09-06: 13:00:00 via INTRAVENOUS
  Filled 2019-09-06: qty 250

## 2019-09-06 MED ORDER — FAMOTIDINE IN NACL 20-0.9 MG/50ML-% IV SOLN
INTRAVENOUS | Status: AC
  Filled 2019-09-06: qty 50

## 2019-09-06 MED ORDER — DIPHENHYDRAMINE HCL 50 MG/ML IJ SOLN
25.0000 mg | Freq: Once | INTRAMUSCULAR | Status: AC
  Administered 2019-09-06: 25 mg via INTRAVENOUS

## 2019-09-06 MED ORDER — DEXAMETHASONE SODIUM PHOSPHATE 10 MG/ML IJ SOLN
10.0000 mg | Freq: Once | INTRAMUSCULAR | Status: AC
  Administered 2019-09-06: 10 mg via INTRAVENOUS

## 2019-09-06 MED ORDER — DIPHENHYDRAMINE HCL 50 MG/ML IJ SOLN
INTRAMUSCULAR | Status: AC
  Filled 2019-09-06: qty 1

## 2019-09-06 MED ORDER — HEPARIN SOD (PORK) LOCK FLUSH 100 UNIT/ML IV SOLN
500.0000 [IU] | Freq: Once | INTRAVENOUS | Status: AC | PRN
  Administered 2019-09-06: 500 [IU]
  Filled 2019-09-06: qty 5

## 2019-09-06 MED ORDER — SODIUM CHLORIDE 0.9% FLUSH
10.0000 mL | INTRAVENOUS | Status: DC | PRN
  Administered 2019-09-06: 10 mL
  Filled 2019-09-06: qty 10

## 2019-09-06 NOTE — Progress Notes (Signed)
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 She has pancytopenia, skin sensitivity and some recent diarrhea We will continue treatment as scheduled but I plan to reduce the dose of Taxol a little bit in the future She will continue to receive treatment on days 1 and 8 and rest day 15 Recommend minimum 3 cycles of treatment before repeat imaging study  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[facility]) Liver enzymes are within normal limits Observe closely  Essential hypertension Her blood pressure slowly trending down She have discontinue 2 of the previous blood pressure medications We will continue to monitor carefully  Peripheral neuropathy due to chemotherapy [facility]) She denies worsening neuropathy We will observe closely  Pancytopenia, acquired ([place]) She is noted to have progressive pancytopenia Recommend mild dose reduction for future dose  Skin sensitivity She developed skin rashes at the sun exposed area We discussed the importance of vigilant skin protection   No orders of the defined types were placed in this encounter.   INTERVAL HISTORY: Please see below for problem oriented charting. She returns for further follow-up The patient developed skin sensitivity, diarrhea since treatment The diarrhea is self-limiting, only the first 2 to 3 days after treatment She denies worsening peripheral neuropathy She was able to taper off 2 of the previous blood pressure medications No recent infection, fever or chills  SUMMARY OF ONCOLOGIC HISTORY: Oncology History Overview Note  Neg genetics. Additional tests revealed ER: 80%, PR 3% MSI stable   Right ovarian epithelial cancer ([facility])  07/01/2015 Initial Diagnosis   She presented with postmenopausal bleeding   07/02/2015 Imaging   US pelvis showed bilateral ovarian  cyst   07/13/2015 Imaging   5.2 cm left adnexal cystic lesion, with indeterminate but probably benign characteristics. In a postmenopausal female, consider continued annual imaging followup with CT or MRI versus surgical evaluation.  Colonic diverticulosis. No radiographic evidence of diverticulitis.  Cholelithiasis.  No radiographic evidence of cholecystitis.  Small hiatal hernia.   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  The tumor involves serosal surface of the right ovary and adjacent right fallopian tube.  Cervix and endometrium were within normal limits. ER positive Peritoneal washing was positive.   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   08/13/2015 Tumor Marker   Patient's tumor was tested for the following markers: CA-125 Results of the tumor marker test revealed 96   08/25/2015 - 12/08/2015 Chemotherapy   The patient had 6 cycles of carboplatin and taxol   09/07/2015 Tumor Marker   Patient's tumor was tested for the following markers: CA-125 Results of the tumor marker test revealed 51   10/05/2015 Tumor Marker   Patient's tumor was tested for the following markers: CA-125 Results of the tumor marker test revealed 29   11/09/2015 Tumor Marker   Patient's tumor was tested for the following markers: CA-125 Results of the tumor marker test revealed 44   12/08/2015 Tumor Marker   Patient's tumor was tested for the following markers: CA-125 Results of the tumor marker test revealed 30   12/15/2015 Genetic Testing   Genetics testing normal by  [facility] Breast Ovarian panel 12-15-15   01/11/2016 Imaging   1. The only finding of note are several adjacent  peripheral abnormal hypodensities along the anterior superior spleen margin, differential diagnostic considerations including small peripheral interval splenic infarcts, splenic injury with subcapsular a fluid collections, or less likely metastatic disease to the splenic margin. This likely merits observation. 2. Other imaging findings of potential clinical significance: Small type 1 hiatal hernia. Aortoiliac atherosclerotic vascular disease. Lower lumbar spondylosis and degenerative disc disease. Gallstones with potential mild gallbladder wall thickening.   03/07/2016 Tumor Marker   Patient's tumor was tested for the following markers: CA-125 Results of the tumor marker test revealed 24.2   04/06/2016 Imaging   1. No evidence of a ovarian cancer metastasis. 2. No ascites. 3. Post hysterectomy and oophorectomy. 4. Cholelithiasis with several large gallstones   04/06/2016 Tumor Marker   Patient's tumor was tested for the following markers: CA-125 Results of the tumor marker test revealed 22.8   05/30/2016 Tumor Marker   Patient's tumor was tested for the following markers: CA-125 Results of the tumor marker test revealed 21   09/07/2016 Tumor Marker   Patient's tumor was tested for the following markers: CA-125 Results of the tumor marker test revealed 22.4   11/28/2016 Tumor Marker   Patient's tumor was tested for the following markers: CA-125 Results of the tumor marker test revealed 18.8   02/23/2017 Tumor Marker   Patient's tumor was tested for the following markers: CA-125 Results of the tumor marker test revealed 19.1   06/02/2017 Tumor Marker   Patient's tumor was tested for the following markers: CA-125 Results of the tumor marker test revealed 18.3   08/24/2017 Mammogram   Pt reports mammogram complete   08/28/2017 Tumor Marker   Patient's tumor was tested for the following markers: CA-125 Results of the tumor marker test revealed 21.1   12/01/2017 Tumor Marker   Patient's  tumor was tested for the following markers: CA-125 Results of the tumor marker test revealed 31.2   12/13/2017 Imaging   New 2.7 cm peritoneal soft tissue mass in anterior left lower quadrant, consistent with metastatic disease.  No other sites of metastatic disease identified.  Incidental findings including: Cholelithiasis. Colonic diverticulosis. Tiny hiatal hernia. Aortic atherosclerosis.   01/09/2018 - 05/07/2018 Chemotherapy   The patient had carboplatin and taxol; After 2nd cycle, she developed carboplatin allergy. She received carboplatin desensitization protocol at [facility] for final 4 cycles, completed by 6/17. Avastin was added from 04/16/18 onwards   01/30/2018 Adverse Reaction   Potential carboplatin allergy is suspected. She received half the dose of prescribed carboplatin on cycle 2   05/31/2018 Tumor Marker   Patient's tumor was tested for the following markers: CA-125 Results of the tumor marker test revealed 26   05/31/2018 Imaging   1. Peritoneal soft tissue nodule identified on the previous study has decreased in the interval the. No progressive findings in the abdomen or pelvis on today's study to suggest disease progression. 2. Tiny pulmonary nodules, likely benign. Attention on follow-up recommended. 3. Cholelithiasis. 4.  Aortic Atherosclerois (ICD10-170.0) 5. Tiny hiatal hernia.     06/04/2018 -  Chemotherapy   The patient is placed on Avastin for maintenance   06/25/2018 Tumor Marker   Patient's tumor was tested for the following markers: CA-125 Results of the tumor marker test revealed 22.5   08/06/2018 Tumor Marker   Patient's tumor was tested for the following markers: CA-125 Results of the tumor marker test revealed 20.7  09/14/2018 Imaging   Status post hysterectomy and bilateral salpingo-oophorectomy.  Stable soft tissue nodule beneath the left lower anterior abdominal wall, likely reflecting stable peritoneal disease.  Scattered small subpleural  nodules in the lungs bilaterally, measuring up to 3 mm, technically indeterminate although likely benign. Please note that Fleischner Society guidelines do not apply. Attention on follow-up is suggested.  No evidence of new/progressive metastatic disease.   09/14/2018 Tumor Marker   Patient's tumor was tested for the following markers: CA-125 Results of the tumor marker test revealed 21.3   10/30/2018 Tumor Marker   Patient's tumor was tested for the following markers: CA-125 Results of the tumor marker test revealed 20.8   12/12/2018 Tumor Marker   Patient's tumor was tested for the following markers: CA-125 Results of the tumor marker test revealed 19.7   01/01/2019 Tumor Marker   Patient's tumor was tested for the following markers: CA-125 Results of the tumor marker test revealed 21.6   01/22/2019 Tumor Marker   Patient's tumor was tested for the following markers: CA-125 Results of the tumor marker test revealed 21.3   02/12/2019 Tumor Marker   Patient's tumor was tested for the following markers: CA-125 Results of the tumor marker test revealed 21.6   04/16/2019 Tumor Marker   Patient's tumor was tested for the following markers: CA-125 Results of the tumor marker test revealed 21.7   05/07/2019 Tumor Marker   Patient's tumor was tested for the following markers: CA-125 Results of the tumor marker test revealed 27.3   05/28/2019 Tumor Marker   Patient's tumor was tested for the following markers: CA-125 Results of the tumor marker test revealed 24.5   07/09/2019 - 08/19/2019 Chemotherapy   The patient had bevacizumab for chemotherapy treatment.     07/09/2019 Tumor Marker   Patient's tumor was tested for the following markers: CA-125 Results of the tumor marker test revealed 23.3.   08/06/2019 Imaging   Ct abdomen and pelvis 1. New hypodense lesions of the left lobe of the liver measuring 2.5 x 2.5 cm (series 2, image 16) and right lobe of the liver measuring 1.1 x 1.1 cm  (series 2, image 23), concerning for metastatic disease.   2. Interval enlargement of an irregular nodule of the omentum measuring 1.0 x 1.0 cm, previously 1.0 x 0.5 cm (series 2, image 51), concerning for peritoneal disease.   3.  Status post hysterectomy.   4. Other chronic and incidental findings as detailed above. Aortic Atherosclerosis (ICD10-I70.0).   08/16/2019 -  Chemotherapy   The patient had PACLitaxel (TAXOL) 132 mg in sodium chloride 0.9 % 250 mL chemo infusion (</= 45m/m2), 80 mg/m2 = 132 mg, Intravenous,  Once, 1 of 3 cycles Dose modification: 72 mg/m2 (90 % of original dose 80 mg/m2, Cycle 1, Reason: Dose Not Tolerated) Administration: 132 mg (08/16/2019), 132 mg (08/23/2019), 132 mg (09/06/2019)  for chemotherapy treatment.    08/23/2019 Tumor Marker   Patient's tumor was tested for the following markers: CA-125 Results of the tumor marker test revealed 33.7.     REVIEW OF SYSTEMS:   Constitutional: Denies fevers, chills or abnormal weight loss Eyes: Denies blurriness of vision Ears, nose, mouth, throat, and face: Denies mucositis or sore throat Respiratory: Denies cough, dyspnea or wheezes Cardiovascular: Denies palpitation, chest discomfort or lower extremity swelling Lymphatics: Denies new lymphadenopathy or easy bruising Neurological:Denies numbness, tingling or new weaknesses Behavioral/Psych: Mood is stable, no new changes  All other systems were reviewed with the patient and are negative.  I have reviewed the past medical history, past surgical history, social history and family history with the patient and they are unchanged from previous note.  ALLERGIES:  is allergic to carboplatin.  MEDICATIONS:  Current Outpatient Medications  Medication Sig Dispense Refill  . amLODipine (NORVASC) 10 MG tablet Take 1 tablet (10 mg total) by mouth daily. 90 tablet 3  . atenolol (TENORMIN) 25 MG tablet Take 0.5 tablets (12.5 mg total) by mouth daily. 30 tablet 9  .  atorvastatin (LIPITOR) 10 MG tablet Take 1 tablet (10 mg total) by mouth daily. 30 tablet 2  . Coenzyme Q10 (CO Q 10) 100 MG CAPS Take 1 capsule by mouth daily.     . Glucosamine-Chondroit-Vit C-Mn (GLUCOSAMINE 1500 COMPLEX PO) Take 1 capsule by mouth 2 (two) times daily.    [name] lidocaine-prilocaine (EMLA) cream Apply to affected area once 30 g 3  . loratadine (CLARITIN) 10 MG tablet Take 10 mg by mouth daily as needed for allergies (hives).    . Multiple Vitamin (MULTIVITAMIN) capsule Take 1 capsule by mouth daily.     . Omega-3 Fatty Acids (OMEGA-3 FISH OIL) 300 MG CAPS Take 1 capsule by mouth daily.     . ondansetron (ZOFRAN) 8 MG tablet Take 1 tablet (8 mg total) by mouth every 8 (eight) hours as needed for refractory nausea / vomiting. 30 tablet 1  . Polyethyl Glycol-Propyl Glycol (SYSTANE ULTRA OP) Apply 1 drop to eye 2 (two) times daily at 10 AM and 5 PM.    . Probiotic Product (PROBIOTIC ADVANCED PO) Take 1 capsule by mouth daily.    . prochlorperazine (COMPAZINE) 10 MG tablet Take 1 tablet (10 mg total) by mouth every 6 (six) hours as needed (Nausea or vomiting). 60 tablet 1  . vitamin E 400 UNIT capsule Take 400 Units by mouth every evening.      No current facility-administered medications for this visit.    Facility-Administered Medications Ordered in Other Visits  Medication Dose Route Frequency Provider Last Rate Last Dose  . sodium chloride flush (NS) 0.9 % injection 10 mL  10 mL Intracatheter PRN Alvy Bimler, Amedio Bowlby, MD   10 mL at 09/06/19 1457    PHYSICAL EXAMINATION: ECOG PERFORMANCE STATUS: 1 - Symptomatic but completely ambulatory  Vitals:   09/06/19 1122  BP: (!) 140/47  Pulse: 69  Resp: 18  Temp: 98 F (36.7 C)  SpO2: 100%   Filed Weights   09/06/19 1122  Weight: 137 lb 14.4 oz (62.6 kg)    GENERAL:alert, no distress and comfortable SKIN: Noted skin rashes at the sun exposed area EYES: normal, Conjunctiva are pink and non-injected, sclera clear OROPHARYNX:no  exudate, no erythema and lips, buccal mucosa, and tongue normal  NECK: supple, thyroid normal size, non-tender, without nodularity LYMPH:  no palpable lymphadenopathy in the cervical, axillary or inguinal LUNGS: clear to auscultation and percussion with normal breathing effort HEART: regular rate & rhythm and no murmurs and no lower extremity edema ABDOMEN:abdomen soft, non-tender and normal bowel sounds Musculoskeletal:no cyanosis of digits and no clubbing  NEURO: alert & oriented x 3 with fluent speech, no focal motor/sensory deficits  LABORATORY DATA:  I have reviewed the data as listed    Component Value Date/Time   NA 141 09/06/2019 1140   NA 142 11/28/2016 0916   K 4.5 09/06/2019 1140   K 3.9 11/28/2016 0916   CL 108 09/06/2019 1140   CO2 23 09/06/2019 1140   CO2 28 11/28/2016 0916   GLUCOSE  100 (H) 09/06/2019 1140   GLUCOSE 78 11/28/2016 0916   BUN 28 (H) 09/06/2019 1140   BUN 16.8 11/28/2016 0916   CREATININE 0.89 09/06/2019 1140   CREATININE 1.15 (H) 05/28/2019 1345   CREATININE 0.8 11/28/2016 0916   CALCIUM 9.2 09/06/2019 1140   CALCIUM 10.5 (H) 11/28/2016 0916   PROT 6.6 09/06/2019 1140   PROT 7.3 11/28/2016 0916   ALBUMIN 3.7 09/06/2019 1140   ALBUMIN 4.3 11/28/2016 0916   AST 24 09/06/2019 1140   AST 23 05/28/2019 1345   AST 19 11/28/2016 0916   ALT 20 09/06/2019 1140   ALT 14 05/28/2019 1345   ALT 19 11/28/2016 0916   ALKPHOS 50 09/06/2019 1140   ALKPHOS 50 11/28/2016 0916   BILITOT 0.3 09/06/2019 1140   BILITOT 0.3 05/28/2019 1345   BILITOT 0.48 11/28/2016 0916   GFRNONAA >60 09/06/2019 1140   GFRNONAA 46 (L) 05/28/2019 1345   GFRAA >60 09/06/2019 1140   GFRAA 53 (L) 05/28/2019 1345    No results found for: SPEP, UPEP  Lab Results  Component Value Date   WBC 2.5 (L) 09/06/2019   NEUTROABS 1.1 (L) 09/06/2019   HGB 12.2 09/06/2019   HCT 37.4 09/06/2019   MCV 92.8 09/06/2019   PLT 114 (L) 09/06/2019      Chemistry      Component Value  Date/Time   NA 141 09/06/2019 1140   NA 142 11/28/2016 0916   K 4.5 09/06/2019 1140   K 3.9 11/28/2016 0916   CL 108 09/06/2019 1140   CO2 23 09/06/2019 1140   CO2 28 11/28/2016 0916   BUN 28 (H) 09/06/2019 1140   BUN 16.8 11/28/2016 0916   CREATININE 0.89 09/06/2019 1140   CREATININE 1.15 (H) 05/28/2019 1345   CREATININE 0.8 11/28/2016 0916      Component Value Date/Time   CALCIUM 9.2 09/06/2019 1140   CALCIUM 10.5 (H) 11/28/2016 0916   ALKPHOS 50 09/06/2019 1140   ALKPHOS 50 11/28/2016 0916   AST 24 09/06/2019 1140   AST 23 05/28/2019 1345   AST 19 11/28/2016 0916   ALT 20 09/06/2019 1140   ALT 14 05/28/2019 1345   ALT 19 11/28/2016 0916   BILITOT 0.3 09/06/2019 1140   BILITOT 0.3 05/28/2019 1345   BILITOT 0.48 11/28/2016 0916       All questions were answered. The patient knows to call the clinic with any problems, questions or concerns. No barriers to learning was detected.  I spent 25 minutes counseling the patient face to face. The total time spent in the appointment was 30 minutes and more than 50% was on counseling and review of test results  Heath Lark, MD 09/06/2019 3:56 PM

## 2019-09-06 NOTE — Assessment & Plan Note (Signed)
She had multiple side effects of treatment She has pancytopenia, skin sensitivity and some recent diarrhea We will continue treatment as scheduled but I plan to reduce the dose of Taxol a little bit in the future She will continue to receive treatment on days 1 and 8 and rest day 15 Recommend minimum 3 cycles of treatment before repeat imaging study

## 2019-09-06 NOTE — Assessment & Plan Note (Signed)
She denies worsening neuropathy We will observe closely

## 2019-09-06 NOTE — Assessment & Plan Note (Signed)
She developed skin rashes at the sun exposed area We discussed the importance of vigilant skin protection

## 2019-09-06 NOTE — Progress Notes (Signed)
CBC reviewed. Per Dr. Alvy Bimler ok to treat with anc of 1.1

## 2019-09-06 NOTE — Assessment & Plan Note (Signed)
She is noted to have progressive pancytopenia Recommend mild dose reduction for future dose

## 2019-09-06 NOTE — Patient Instructions (Signed)

## 2019-09-06 NOTE — Assessment & Plan Note (Signed)
Liver enzymes are within normal limits Observe closely

## 2019-09-06 NOTE — Assessment & Plan Note (Signed)
Her blood pressure slowly trending down She have discontinue 2 of the previous blood pressure medications We will continue to monitor carefully

## 2019-09-06 NOTE — Patient Instructions (Signed)
[facility] Discharge Instructions for Patients Receiving Chemotherapy  Today you received the following chemotherapy agents:  Taxol.  To help prevent nausea and vomiting after your treatment, we encourage you to take your nausea medication as directed.   If you develop nausea and vomiting that is not controlled by your nausea medication, call the clinic.   BELOW ARE SYMPTOMS THAT SHOULD BE REPORTED IMMEDIATELY:  *FEVER GREATER THAN 100.5 F  *CHILLS WITH OR WITHOUT FEVER  NAUSEA AND VOMITING THAT IS NOT CONTROLLED WITH YOUR NAUSEA MEDICATION  *UNUSUAL SHORTNESS OF BREATH  *UNUSUAL BRUISING OR BLEEDING  TENDERNESS IN MOUTH AND THROAT WITH OR WITHOUT PRESENCE OF ULCERS  *URINARY PROBLEMS  *BOWEL PROBLEMS  UNUSUAL RASH Items with * indicate a potential emergency and should be followed up as soon as possible.  Feel free to call the clinic should you have any questions or concerns. The clinic phone number is [phone].  Please show the CHEMO ALERT CARD at check-in to the Emergency Department and triage nurse.   

## 2019-09-07 LAB — CA 125: Cancer Antigen (CA) 125: 32.2 U/mL (ref 0.0–38.1)

## 2019-09-09 ENCOUNTER — Telehealth: Payer: Self-pay | Admitting: *Deleted

## 2019-09-09 NOTE — Telephone Encounter (Signed)
Telephone call to patient and advised lab results as directed below. Patient denies any further concerns or questions at this time but understands to call.

## 2019-09-09 NOTE — Telephone Encounter (Signed)
-----   Message from Heath Lark, MD sent at 09/09/2019  7:57 AM EDT ----- Regarding: pls let her know CA-125 is stable

## 2019-09-11 ENCOUNTER — Telehealth: Payer: Self-pay | Admitting: Hematology and Oncology

## 2019-09-11 NOTE — Telephone Encounter (Signed)
I talk with patient regarding schedule. Pt prefer morning hours if possible

## 2019-09-12 ENCOUNTER — Encounter: Payer: Self-pay | Admitting: Hematology and Oncology

## 2019-09-13 ENCOUNTER — Inpatient Hospital Stay: Payer: Medicare Other

## 2019-09-13 ENCOUNTER — Other Ambulatory Visit: Payer: Self-pay

## 2019-09-13 VITALS — BP 149/59 | HR 53 | Temp 98.5°F | Resp 18

## 2019-09-13 DIAGNOSIS — C561 Malignant neoplasm of right ovary: Secondary | ICD-10-CM

## 2019-09-13 DIAGNOSIS — C787 Secondary malignant neoplasm of liver and intrahepatic bile duct: Secondary | ICD-10-CM

## 2019-09-13 DIAGNOSIS — Z7189 Other specified counseling: Secondary | ICD-10-CM

## 2019-09-13 DIAGNOSIS — Z5111 Encounter for antineoplastic chemotherapy: Secondary | ICD-10-CM | POA: Diagnosis not present

## 2019-09-13 LAB — COMPREHENSIVE METABOLIC PANEL
ALT: 21 U/L (ref 0–44)
AST: 24 U/L (ref 15–41)
Albumin: 3.6 g/dL (ref 3.5–5.0)
Alkaline Phosphatase: 52 U/L (ref 38–126)
Anion gap: 10 (ref 5–15)
BUN: 22 mg/dL (ref 8–23)
CO2: 23 mmol/L (ref 22–32)
Calcium: 9.3 mg/dL (ref 8.9–10.3)
Chloride: 107 mmol/L (ref 98–111)
Creatinine, Ser: 0.93 mg/dL (ref 0.44–1.00)
GFR calc Af Amer: >60 mL/min (ref >60)
GFR calc non Af Amer: 59 mL/min — ABNORMAL LOW (ref >60)
Glucose, Bld: 97 mg/dL (ref 70–99)
Potassium: 4.4 mmol/L (ref 3.5–5.1)
Sodium: 140 mmol/L (ref 135–145)
Total Bilirubin: 0.4 mg/dL (ref 0.3–1.2)
Total Protein: 6.7 g/dL (ref 6.5–8.1)

## 2019-09-13 LAB — CBC WITH DIFFERENTIAL/PLATELET
Abs Immature Granulocytes: 0.02 10*3/uL (ref 0.00–0.07)
Basophils Absolute: 0 10*3/uL (ref 0.0–0.1)
Basophils Relative: 1 %
Eosinophils Absolute: 0.1 10*3/uL (ref 0.0–0.5)
Eosinophils Relative: 2 %
HCT: 35.2 % — ABNORMAL LOW (ref 36.0–46.0)
Hemoglobin: 11.6 g/dL — ABNORMAL LOW (ref 12.0–15.0)
Immature Granulocytes: 1 %
Lymphocytes Relative: 40 %
Lymphs Abs: 1.1 10*3/uL (ref 0.7–4.0)
MCH: 30.2 pg (ref 26.0–34.0)
MCHC: 33 g/dL (ref 30.0–36.0)
MCV: 91.7 fL (ref 80.0–100.0)
Monocytes Absolute: 0.3 10*3/uL (ref 0.1–1.0)
Monocytes Relative: 10 %
Neutro Abs: 1.2 10*3/uL — ABNORMAL LOW (ref 1.7–7.7)
Neutrophils Relative %: 46 %
Platelets: 113 10*3/uL — ABNORMAL LOW (ref 150–400)
RBC: 3.84 MIL/uL — ABNORMAL LOW (ref 3.87–5.11)
RDW: 13.8 % (ref 11.5–15.5)
WBC: 2.7 10*3/uL — ABNORMAL LOW (ref 4.0–10.5)
nRBC: 0 % (ref 0.0–0.2)

## 2019-09-13 MED ORDER — SODIUM CHLORIDE 0.9 % IV SOLN
Freq: Once | INTRAVENOUS | Status: AC
  Administered 2019-09-13: 13:00:00 via INTRAVENOUS
  Filled 2019-09-13: qty 250

## 2019-09-13 MED ORDER — DIPHENHYDRAMINE HCL 50 MG/ML IJ SOLN
INTRAMUSCULAR | Status: AC
  Filled 2019-09-13: qty 1

## 2019-09-13 MED ORDER — SODIUM CHLORIDE 0.9% FLUSH
10.0000 mL | INTRAVENOUS | Status: DC | PRN
  Administered 2019-09-13: 15:00:00 10 mL
  Filled 2019-09-13: qty 10

## 2019-09-13 MED ORDER — SODIUM CHLORIDE 0.9% FLUSH
10.0000 mL | Freq: Once | INTRAVENOUS | Status: AC
  Administered 2019-09-13: 10 mL
  Filled 2019-09-13: qty 10

## 2019-09-13 MED ORDER — FAMOTIDINE IN NACL 20-0.9 MG/50ML-% IV SOLN
INTRAVENOUS | Status: AC
  Filled 2019-09-13: qty 50

## 2019-09-13 MED ORDER — DEXAMETHASONE SODIUM PHOSPHATE 10 MG/ML IJ SOLN
INTRAMUSCULAR | Status: AC
  Filled 2019-09-13: qty 1

## 2019-09-13 MED ORDER — FAMOTIDINE IN NACL 20-0.9 MG/50ML-% IV SOLN
20.0000 mg | Freq: Once | INTRAVENOUS | Status: AC
  Administered 2019-09-13: 13:00:00 20 mg via INTRAVENOUS

## 2019-09-13 MED ORDER — DEXAMETHASONE SODIUM PHOSPHATE 10 MG/ML IJ SOLN
10.0000 mg | Freq: Once | INTRAMUSCULAR | Status: AC
  Administered 2019-09-13: 13:00:00 10 mg via INTRAVENOUS

## 2019-09-13 MED ORDER — HEPARIN SOD (PORK) LOCK FLUSH 100 UNIT/ML IV SOLN
500.0000 [IU] | Freq: Once | INTRAVENOUS | Status: AC | PRN
  Administered 2019-09-13: 15:00:00 500 [IU]
  Filled 2019-09-13: qty 5

## 2019-09-13 MED ORDER — SODIUM CHLORIDE 0.9 % IV SOLN
72.0000 mg/m2 | Freq: Once | INTRAVENOUS | Status: AC
  Administered 2019-09-13: 120 mg via INTRAVENOUS
  Filled 2019-09-13: qty 20

## 2019-09-13 MED ORDER — DIPHENHYDRAMINE HCL 50 MG/ML IJ SOLN
25.0000 mg | Freq: Once | INTRAMUSCULAR | Status: AC
  Administered 2019-09-13: 13:00:00 25 mg via INTRAVENOUS

## 2019-09-13 NOTE — Patient Instructions (Signed)

## 2019-09-27 ENCOUNTER — Encounter: Payer: Self-pay | Admitting: Hematology and Oncology

## 2019-09-27 ENCOUNTER — Telehealth: Payer: Self-pay | Admitting: Hematology and Oncology

## 2019-09-27 ENCOUNTER — Inpatient Hospital Stay: Payer: Medicare Other | Admitting: Hematology and Oncology

## 2019-09-27 ENCOUNTER — Inpatient Hospital Stay: Payer: Medicare Other

## 2019-09-27 ENCOUNTER — Inpatient Hospital Stay: Payer: Medicare Other | Attending: Gynecology

## 2019-09-27 ENCOUNTER — Other Ambulatory Visit: Payer: Self-pay

## 2019-09-27 VITALS — BP 157/74 | HR 75 | Temp 98.3°F | Resp 18 | Ht 62.0 in | Wt 139.6 lb

## 2019-09-27 DIAGNOSIS — Z5111 Encounter for antineoplastic chemotherapy: Secondary | ICD-10-CM | POA: Insufficient documentation

## 2019-09-27 DIAGNOSIS — D61818 Other pancytopenia: Secondary | ICD-10-CM | POA: Diagnosis not present

## 2019-09-27 DIAGNOSIS — Z7189 Other specified counseling: Secondary | ICD-10-CM

## 2019-09-27 DIAGNOSIS — C787 Secondary malignant neoplasm of liver and intrahepatic bile duct: Secondary | ICD-10-CM | POA: Insufficient documentation

## 2019-09-27 DIAGNOSIS — R21 Rash and other nonspecific skin eruption: Secondary | ICD-10-CM | POA: Insufficient documentation

## 2019-09-27 DIAGNOSIS — I10 Essential (primary) hypertension: Secondary | ICD-10-CM | POA: Diagnosis not present

## 2019-09-27 DIAGNOSIS — G620 Drug-induced polyneuropathy: Secondary | ICD-10-CM

## 2019-09-27 DIAGNOSIS — T451X5A Adverse effect of antineoplastic and immunosuppressive drugs, initial encounter: Secondary | ICD-10-CM

## 2019-09-27 DIAGNOSIS — C561 Malignant neoplasm of right ovary: Secondary | ICD-10-CM

## 2019-09-27 DIAGNOSIS — R238 Other skin changes: Secondary | ICD-10-CM

## 2019-09-27 LAB — COMPREHENSIVE METABOLIC PANEL
ALT: 14 U/L (ref 0–44)
AST: 20 U/L (ref 15–41)
Albumin: 3.5 g/dL (ref 3.5–5.0)
Alkaline Phosphatase: 53 U/L (ref 38–126)
Anion gap: 10 (ref 5–15)
BUN: 23 mg/dL (ref 8–23)
CO2: 24 mmol/L (ref 22–32)
Calcium: 8.7 mg/dL — ABNORMAL LOW (ref 8.9–10.3)
Chloride: 106 mmol/L (ref 98–111)
Creatinine, Ser: 0.82 mg/dL (ref 0.44–1.00)
GFR calc Af Amer: >60 mL/min (ref >60)
GFR calc non Af Amer: >60 mL/min (ref >60)
Glucose, Bld: 91 mg/dL (ref 70–99)
Potassium: 4.2 mmol/L (ref 3.5–5.1)
Sodium: 140 mmol/L (ref 135–145)
Total Bilirubin: 0.5 mg/dL (ref 0.3–1.2)
Total Protein: 6.3 g/dL — ABNORMAL LOW (ref 6.5–8.1)

## 2019-09-27 LAB — CBC WITH DIFFERENTIAL/PLATELET
Abs Immature Granulocytes: 0 10*3/uL (ref 0.00–0.07)
Basophils Absolute: 0 10*3/uL (ref 0.0–0.1)
Basophils Relative: 1 %
Eosinophils Absolute: 0 10*3/uL (ref 0.0–0.5)
Eosinophils Relative: 1 %
HCT: 35.1 % — ABNORMAL LOW (ref 36.0–46.0)
Hemoglobin: 11.4 g/dL — ABNORMAL LOW (ref 12.0–15.0)
Immature Granulocytes: 0 %
Lymphocytes Relative: 36 %
Lymphs Abs: 1 10*3/uL (ref 0.7–4.0)
MCH: 30.2 pg (ref 26.0–34.0)
MCHC: 32.5 g/dL (ref 30.0–36.0)
MCV: 92.9 fL (ref 80.0–100.0)
Monocytes Absolute: 0.5 10*3/uL (ref 0.1–1.0)
Monocytes Relative: 17 %
Neutro Abs: 1.2 10*3/uL — ABNORMAL LOW (ref 1.7–7.7)
Neutrophils Relative %: 45 %
Platelets: 109 10*3/uL — ABNORMAL LOW (ref 150–400)
RBC: 3.78 MIL/uL — ABNORMAL LOW (ref 3.87–5.11)
RDW: 15 % (ref 11.5–15.5)
WBC: 2.8 10*3/uL — ABNORMAL LOW (ref 4.0–10.5)
nRBC: 0 % (ref 0.0–0.2)

## 2019-09-27 MED ORDER — FAMOTIDINE IN NACL 20-0.9 MG/50ML-% IV SOLN
20.0000 mg | Freq: Once | INTRAVENOUS | Status: AC
  Administered 2019-09-27: 20 mg via INTRAVENOUS

## 2019-09-27 MED ORDER — SODIUM CHLORIDE 0.9% FLUSH
10.0000 mL | Freq: Once | INTRAVENOUS | Status: AC
  Administered 2019-09-27: 10 mL
  Filled 2019-09-27: qty 10

## 2019-09-27 MED ORDER — SODIUM CHLORIDE 0.9 % IV SOLN
Freq: Once | INTRAVENOUS | Status: AC
  Administered 2019-09-27: 14:00:00 via INTRAVENOUS
  Filled 2019-09-27: qty 250

## 2019-09-27 MED ORDER — DIPHENHYDRAMINE HCL 50 MG/ML IJ SOLN
25.0000 mg | Freq: Once | INTRAMUSCULAR | Status: AC
  Administered 2019-09-27: 25 mg via INTRAVENOUS

## 2019-09-27 MED ORDER — DEXAMETHASONE SODIUM PHOSPHATE 10 MG/ML IJ SOLN
10.0000 mg | Freq: Once | INTRAMUSCULAR | Status: AC
  Administered 2019-09-27: 10 mg via INTRAVENOUS

## 2019-09-27 MED ORDER — FAMOTIDINE IN NACL 20-0.9 MG/50ML-% IV SOLN
INTRAVENOUS | Status: AC
  Filled 2019-09-27: qty 50

## 2019-09-27 MED ORDER — SODIUM CHLORIDE 0.9 % IV SOLN
72.0000 mg/m2 | Freq: Once | INTRAVENOUS | Status: AC
  Administered 2019-09-27: 120 mg via INTRAVENOUS
  Filled 2019-09-27: qty 20

## 2019-09-27 MED ORDER — SODIUM CHLORIDE 0.9% FLUSH
10.0000 mL | INTRAVENOUS | Status: DC | PRN
  Administered 2019-09-27: 10 mL
  Filled 2019-09-27: qty 10

## 2019-09-27 MED ORDER — HEPARIN SOD (PORK) LOCK FLUSH 100 UNIT/ML IV SOLN
500.0000 [IU] | Freq: Once | INTRAVENOUS | Status: AC | PRN
  Administered 2019-09-27: 500 [IU]
  Filled 2019-09-27: qty 5

## 2019-09-27 MED ORDER — DIPHENHYDRAMINE HCL 50 MG/ML IJ SOLN
INTRAMUSCULAR | Status: AC
  Filled 2019-09-27: qty 1

## 2019-09-27 MED ORDER — DEXAMETHASONE SODIUM PHOSPHATE 10 MG/ML IJ SOLN
INTRAMUSCULAR | Status: AC
  Filled 2019-09-27: qty 1

## 2019-09-27 NOTE — Telephone Encounter (Signed)
Added 11/25 f/u. Patient given schedule in infusion.

## 2019-09-27 NOTE — Assessment & Plan Note (Signed)
She is noted to have progressive pancytopenia We will continue with recent dose changes She is not symptomatic

## 2019-09-27 NOTE — Assessment & Plan Note (Signed)
She tolerated treatment well except for skin and nail changes, mild pancytopenia as well as leg swelling which I believe are probably unrelated We will proceed with treatment as scheduled I plan to repeat imaging study at the end of the month for objective assessment of response to therapy Her tumor marker so far is stable

## 2019-09-27 NOTE — Patient Instructions (Signed)
[facility] Discharge Instructions for Patients Receiving Chemotherapy  Today you received the following chemotherapy agents Paclitaxel.   To help prevent nausea and vomiting after your treatment, we encourage you to take your nausea medication as prescribed.   If you develop nausea and vomiting that is not controlled by your nausea medication, call the clinic.   BELOW ARE SYMPTOMS THAT SHOULD BE REPORTED IMMEDIATELY:  *FEVER GREATER THAN 100.5 F  *CHILLS WITH OR WITHOUT FEVER  NAUSEA AND VOMITING THAT IS NOT CONTROLLED WITH YOUR NAUSEA MEDICATION  *UNUSUAL SHORTNESS OF BREATH  *UNUSUAL BRUISING OR BLEEDING  TENDERNESS IN MOUTH AND THROAT WITH OR WITHOUT PRESENCE OF ULCERS  *URINARY PROBLEMS  *BOWEL PROBLEMS  UNUSUAL RASH Items with * indicate a potential emergency and should be followed up as soon as possible.  Feel free to call the clinic should you have any questions or concerns. The clinic phone number is [phone].  Please show the CHEMO ALERT CARD at check-in to the Emergency Department and triage nurse. 

## 2019-09-27 NOTE — Assessment & Plan Note (Signed)
She denies worsening neuropathy We will observe closely

## 2019-09-27 NOTE — Progress Notes (Signed)
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[facility]) She tolerated treatment well except for skin and nail changes, mild pancytopenia as well as leg swelling which I believe are probably unrelated We will proceed with treatment as scheduled I plan to repeat imaging study at the end of the month for objective assessment of response to therapy Her tumor marker so far is stable  Pancytopenia, acquired ([place]) She is noted to have progressive pancytopenia We will continue with recent dose changes She is not symptomatic  Essential hypertension She has persistent hypertension and leg swelling I recommend her to switch amlodipine in the evening and to take atenolol in the morning We will continue to manage her blood pressure with medication adjustment  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 CT Abdomen Pelvis W Contrast    Standing Status:   Future    Standing Expiration Date:   09/26/2020    Order Specific Question:   If indicated for the ordered procedure, I authorize the administration of contrast media per Radiology protocol    Answer:   Yes    Order Specific Question:   Preferred imaging location?    Answer:   [facility]    Order Specific Question:   Radiology Contrast Protocol - do NOT remove file path    Answer:   [email or web address]    INTERVAL HISTORY: Please see below for problem oriented charting. She returns for chemotherapy and follow-up She complained of some mild skin rash at sun exposed  area, alopecia, new changes in leg swelling with hypertension Her blood pressure is intermittently high at home She denies recent infection, fever or chills The patient denies any recent signs or symptoms of bleeding such as spontaneous epistaxis, hematuria or hematochezia. No worsening peripheral neuropathy  SUMMARY OF ONCOLOGIC HISTORY: Oncology History Overview Note  Neg genetics. Additional tests revealed ER: 80%, PR 3% MSI stable   Right ovarian epithelial cancer ([place])  07/01/2015 Initial Diagnosis   She presented with postmenopausal bleeding   07/02/2015 Imaging   US pelvis showed bilateral ovarian cyst   07/13/2015 Imaging   5.2 cm left adnexal cystic lesion, with indeterminate but probably benign characteristics. In a postmenopausal female, consider continued annual imaging followup with CT or MRI versus surgical evaluation.  Colonic diverticulosis. No radiographic evidence of diverticulitis.  Cholelithiasis.  No radiographic evidence of cholecystitis.  Small hiatal hernia.   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  The tumor involves serosal surface of the right ovary and adjacent right fallopian tube.  Cervix and endometrium were within normal limits. ER positive Peritoneal washing was positive.   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   08/13/2015 Tumor Marker   Patient's tumor was tested for the following markers: CA-125 Results of the tumor marker test revealed 96   08/25/2015 - 12/08/2015 Chemotherapy  The patient had 6 cycles of carboplatin and taxol   09/07/2015 Tumor Marker   Patient's tumor was tested for the following markers: CA-125 Results of  the tumor marker test revealed 51   10/05/2015 Tumor Marker   Patient's tumor was tested for the following markers: CA-125 Results of the tumor marker test revealed 29   11/09/2015 Tumor Marker   Patient's tumor was tested for the following markers: CA-125 Results of the tumor marker test revealed 44   12/08/2015 Tumor Marker   Patient's tumor was tested for the following markers: CA-125 Results of the tumor marker test revealed 30   12/15/2015 Genetic Testing   Genetics testing normal by [facility] Breast Ovarian panel 12-15-15   01/11/2016 Imaging   1. The only finding of note are several adjacent peripheral abnormal hypodensities along the anterior superior spleen margin, differential diagnostic considerations including small peripheral interval splenic infarcts, splenic injury with subcapsular a fluid collections, or less likely metastatic disease to the splenic margin. This likely merits observation. 2. Other imaging findings of potential clinical significance: Small type 1 hiatal hernia. Aortoiliac atherosclerotic vascular disease. Lower lumbar spondylosis and degenerative disc disease. Gallstones with potential mild gallbladder wall thickening.   03/07/2016 Tumor Marker   Patient's tumor was tested for the following markers: CA-125 Results of the tumor marker test revealed 24.2   04/06/2016 Imaging   1. No evidence of a ovarian cancer metastasis. 2. No ascites. 3. Post hysterectomy and oophorectomy. 4. Cholelithiasis with several large gallstones   04/06/2016 Tumor Marker   Patient's tumor was tested for the following markers: CA-125 Results of the tumor marker test revealed 22.8   05/30/2016 Tumor Marker   Patient's tumor was tested for the following markers: CA-125 Results of the tumor marker test revealed 21   09/07/2016 Tumor Marker   Patient's tumor was tested for the following markers: CA-125 Results of the tumor marker test revealed 22.4   11/28/2016 Tumor Marker    Patient's tumor was tested for the following markers: CA-125 Results of the tumor marker test revealed 18.8   02/23/2017 Tumor Marker   Patient's tumor was tested for the following markers: CA-125 Results of the tumor marker test revealed 19.1   06/02/2017 Tumor Marker   Patient's tumor was tested for the following markers: CA-125 Results of the tumor marker test revealed 18.3   08/24/2017 Mammogram   Pt reports mammogram complete   08/28/2017 Tumor Marker   Patient's tumor was tested for the following markers: CA-125 Results of the tumor marker test revealed 21.1   12/01/2017 Tumor Marker   Patient's tumor was tested for the following markers: CA-125 Results of the tumor marker test revealed 31.2   12/13/2017 Imaging   New 2.7 cm peritoneal soft tissue mass in anterior left lower quadrant, consistent with metastatic disease.  No other sites of metastatic disease identified.  Incidental findings including: Cholelithiasis. Colonic diverticulosis. Tiny hiatal hernia. Aortic atherosclerosis.   01/09/2018 - 05/07/2018 Chemotherapy   The patient had carboplatin and taxol; After 2nd cycle, she developed carboplatin allergy. She received carboplatin desensitization protocol at [facility] for final 4 cycles, completed by 6/17. Avastin was added from 04/16/18 onwards   01/30/2018 Adverse Reaction   Potential carboplatin allergy is suspected. She received half the dose of prescribed carboplatin on cycle 2   05/31/2018 Tumor Marker   Patient's tumor was tested for the following markers: CA-125 Results of the tumor marker test revealed 26   05/31/2018 Imaging   1. Peritoneal soft  tissue nodule identified on the previous study has decreased in the interval the. No progressive findings in the abdomen or pelvis on today's study to suggest disease progression. 2. Tiny pulmonary nodules, likely benign. Attention on follow-up recommended. 3. Cholelithiasis. 4.  Aortic Atherosclerois (ICD10-170.0) 5. Tiny  hiatal hernia.     06/04/2018 -  Chemotherapy   The patient is placed on Avastin for maintenance   06/25/2018 Tumor Marker   Patient's tumor was tested for the following markers: CA-125 Results of the tumor marker test revealed 22.5   08/06/2018 Tumor Marker   Patient's tumor was tested for the following markers: CA-125 Results of the tumor marker test revealed 20.7   09/14/2018 Imaging   Status post hysterectomy and bilateral salpingo-oophorectomy.  Stable soft tissue nodule beneath the left lower anterior abdominal wall, likely reflecting stable peritoneal disease.  Scattered small subpleural nodules in the lungs bilaterally, measuring up to 3 mm, technically indeterminate although likely benign. Please note that Fleischner Society guidelines do not apply. Attention on follow-up is suggested.  No evidence of new/progressive metastatic disease.   09/14/2018 Tumor Marker   Patient's tumor was tested for the following markers: CA-125 Results of the tumor marker test revealed 21.3   10/30/2018 Tumor Marker   Patient's tumor was tested for the following markers: CA-125 Results of the tumor marker test revealed 20.8   12/12/2018 Tumor Marker   Patient's tumor was tested for the following markers: CA-125 Results of the tumor marker test revealed 19.7   01/01/2019 Tumor Marker   Patient's tumor was tested for the following markers: CA-125 Results of the tumor marker test revealed 21.6   01/22/2019 Tumor Marker   Patient's tumor was tested for the following markers: CA-125 Results of the tumor marker test revealed 21.3   02/12/2019 Tumor Marker   Patient's tumor was tested for the following markers: CA-125 Results of the tumor marker test revealed 21.6   04/16/2019 Tumor Marker   Patient's tumor was tested for the following markers: CA-125 Results of the tumor marker test revealed 21.7   05/07/2019 Tumor Marker   Patient's tumor was tested for the following markers:  CA-125 Results of the tumor marker test revealed 27.3   05/28/2019 Tumor Marker   Patient's tumor was tested for the following markers: CA-125 Results of the tumor marker test revealed 24.5   07/09/2019 - 08/19/2019 Chemotherapy   The patient had bevacizumab for chemotherapy treatment.     07/09/2019 Tumor Marker   Patient's tumor was tested for the following markers: CA-125 Results of the tumor marker test revealed 23.3.   08/06/2019 Imaging   Ct abdomen and pelvis 1. New hypodense lesions of the left lobe of the liver measuring 2.5 x 2.5 cm (series 2, image 16) and right lobe of the liver measuring 1.1 x 1.1 cm (series 2, image 23), concerning for metastatic disease.   2. Interval enlargement of an irregular nodule of the omentum measuring 1.0 x 1.0 cm, previously 1.0 x 0.5 cm (series 2, image 51), concerning for peritoneal disease.   3.  Status post hysterectomy.   4. Other chronic and incidental findings as detailed above. Aortic Atherosclerosis (ICD10-I70.0).   08/16/2019 -  Chemotherapy   The patient had PACLitaxel (TAXOL) 132 mg in sodium chloride 0.9 % 250 mL chemo infusion (</= 43m/m2), 80 mg/m2 = 132 mg, Intravenous,  Once, 1 of 3 cycles Dose modification: 72 mg/m2 (90 % of original dose 80 mg/m2, Cycle 1, Reason: Dose Not Tolerated) Administration: 132  mg (08/16/2019), 132 mg (08/23/2019), 132 mg (09/06/2019), 120 mg (09/13/2019)  for chemotherapy treatment.    08/23/2019 Tumor Marker   Patient's tumor was tested for the following markers: CA-125 Results of the tumor marker test revealed 33.7.   09/06/2019 Tumor Marker   Patient's tumor was tested for the following markers: CA-125 Results of the tumor marker test revealed 32.2     REVIEW OF SYSTEMS:   Constitutional: Denies fevers, chills or abnormal weight loss Eyes: Denies blurriness of vision Ears, nose, mouth, throat, and face: Denies mucositis or sore throat Respiratory: Denies cough, dyspnea or  wheezes Gastrointestinal:  Denies nausea, heartburn or change in bowel habits Lymphatics: Denies new lymphadenopathy or easy bruising Neurological:Denies numbness, tingling or new weaknesses Behavioral/Psych: Mood is stable, no new changes  All other systems were reviewed with the patient and are negative.  I have reviewed the past medical history, past surgical history, social history and family history with the patient and they are unchanged from previous note.  ALLERGIES:  is allergic to carboplatin.  MEDICATIONS:  Current Outpatient Medications  Medication Sig Dispense Refill  . amLODipine (NORVASC) 10 MG tablet Take 1 tablet (10 mg total) by mouth daily. 90 tablet 3  . atenolol (TENORMIN) 25 MG tablet Take 0.5 tablets (12.5 mg total) by mouth daily. 30 tablet 9  . atorvastatin (LIPITOR) 10 MG tablet Take 1 tablet (10 mg total) by mouth daily. 30 tablet 2  . Coenzyme Q10 (CO Q 10) 100 MG CAPS Take 1 capsule by mouth daily.     . Glucosamine-Chondroit-Vit C-Mn (GLUCOSAMINE 1500 COMPLEX PO) Take 1 capsule by mouth 2 (two) times daily.    [name] lidocaine-prilocaine (EMLA) cream Apply to affected area once 30 g 3  . loratadine (CLARITIN) 10 MG tablet Take 10 mg by mouth daily as needed for allergies (hives).    . Multiple Vitamin (MULTIVITAMIN) capsule Take 1 capsule by mouth daily.     . Omega-3 Fatty Acids (OMEGA-3 FISH OIL) 300 MG CAPS Take 1 capsule by mouth daily.     . ondansetron (ZOFRAN) 8 MG tablet Take 1 tablet (8 mg total) by mouth every 8 (eight) hours as needed for refractory nausea / vomiting. 30 tablet 1  . Polyethyl Glycol-Propyl Glycol (SYSTANE ULTRA OP) Apply 1 drop to eye 2 (two) times daily at 10 AM and 5 PM.    . Probiotic Product (PROBIOTIC ADVANCED PO) Take 1 capsule by mouth daily.    . prochlorperazine (COMPAZINE) 10 MG tablet Take 1 tablet (10 mg total) by mouth every 6 (six) hours as needed (Nausea or vomiting). 60 tablet 1  . vitamin E 400 UNIT capsule Take 400  Units by mouth every evening.      No current facility-administered medications for this visit.     PHYSICAL EXAMINATION: ECOG PERFORMANCE STATUS: 1 - Symptomatic but completely ambulatory  Vitals:   09/27/19 1318  BP: (!) 157/74  Pulse: 75  Resp: 18  Temp: 98.3 F (36.8 C)  SpO2: 99%   Filed Weights   09/27/19 1318  Weight: 139 lb 9.6 oz (63.3 kg)    GENERAL:alert, no distress and comfortable SKIN: Noted mild skin rash at sun exposed area EYES: normal, Conjunctiva are pink and non-injected, sclera clear OROPHARYNX:no exudate, no erythema and lips, buccal mucosa, and tongue normal  NECK: supple, thyroid normal size, non-tender, without nodularity LYMPH:  no palpable lymphadenopathy in the cervical, axillary or inguinal LUNGS: clear to auscultation and percussion with normal breathing effort HEART:  regular rate & rhythm and no murmurs with mild bilateral lower extremity edema ABDOMEN:abdomen soft, non-tender and normal bowel sounds Musculoskeletal:no cyanosis of digits and no clubbing  NEURO: alert & oriented x 3 with fluent speech, no focal motor/sensory deficits  LABORATORY DATA:  I have reviewed the data as listed    Component Value Date/Time   NA 140 09/27/2019 1231   NA 142 11/28/2016 0916   K 4.2 09/27/2019 1231   K 3.9 11/28/2016 0916   CL 106 09/27/2019 1231   CO2 24 09/27/2019 1231   CO2 28 11/28/2016 0916   GLUCOSE 91 09/27/2019 1231   GLUCOSE 78 11/28/2016 0916   BUN 23 09/27/2019 1231   BUN 16.8 11/28/2016 0916   CREATININE 0.82 09/27/2019 1231   CREATININE 1.15 (H) 05/28/2019 1345   CREATININE 0.8 11/28/2016 0916   CALCIUM 8.7 (L) 09/27/2019 1231   CALCIUM 10.5 (H) 11/28/2016 0916   PROT 6.3 (L) 09/27/2019 1231   PROT 7.3 11/28/2016 0916   ALBUMIN 3.5 09/27/2019 1231   ALBUMIN 4.3 11/28/2016 0916   AST 20 09/27/2019 1231   AST 23 05/28/2019 1345   AST 19 11/28/2016 0916   ALT 14 09/27/2019 1231   ALT 14 05/28/2019 1345   ALT 19 11/28/2016  0916   ALKPHOS 53 09/27/2019 1231   ALKPHOS 50 11/28/2016 0916   BILITOT 0.5 09/27/2019 1231   BILITOT 0.3 05/28/2019 1345   BILITOT 0.48 11/28/2016 0916   GFRNONAA >60 09/27/2019 1231   GFRNONAA 46 (L) 05/28/2019 1345   GFRAA >60 09/27/2019 1231   GFRAA 53 (L) 05/28/2019 1345    No results found for: SPEP, UPEP  Lab Results  Component Value Date   WBC 2.8 (L) 09/27/2019   NEUTROABS 1.2 (L) 09/27/2019   HGB 11.4 (L) 09/27/2019   HCT 35.1 (L) 09/27/2019   MCV 92.9 09/27/2019   PLT 109 (L) 09/27/2019      Chemistry      Component Value Date/Time   NA 140 09/27/2019 1231   NA 142 11/28/2016 0916   K 4.2 09/27/2019 1231   K 3.9 11/28/2016 0916   CL 106 09/27/2019 1231   CO2 24 09/27/2019 1231   CO2 28 11/28/2016 0916   BUN 23 09/27/2019 1231   BUN 16.8 11/28/2016 0916   CREATININE 0.82 09/27/2019 1231   CREATININE 1.15 (H) 05/28/2019 1345   CREATININE 0.8 11/28/2016 0916      Component Value Date/Time   CALCIUM 8.7 (L) 09/27/2019 1231   CALCIUM 10.5 (H) 11/28/2016 0916   ALKPHOS 53 09/27/2019 1231   ALKPHOS 50 11/28/2016 0916   AST 20 09/27/2019 1231   AST 23 05/28/2019 1345   AST 19 11/28/2016 0916   ALT 14 09/27/2019 1231   ALT 14 05/28/2019 1345   ALT 19 11/28/2016 0916   BILITOT 0.5 09/27/2019 1231   BILITOT 0.3 05/28/2019 1345   BILITOT 0.48 11/28/2016 0916       All questions were answered. The patient knows to call the clinic with any problems, questions or concerns. No barriers to learning was detected.  I spent 25 minutes counseling the patient face to face. The total time spent in the appointment was 30 minutes and more than 50% was on counseling and review of test results  Heath Lark, MD 09/27/2019 2:08 PM

## 2019-09-27 NOTE — Telephone Encounter (Signed)
Update. Confirmed 11/25 visit with patient.

## 2019-09-27 NOTE — Assessment & Plan Note (Signed)
She developed skin rashes at the sun exposed area We discussed the importance of vigilant skin protection

## 2019-09-27 NOTE — Assessment & Plan Note (Signed)
She has persistent hypertension and leg swelling I recommend her to switch amlodipine in the evening and to take atenolol in the morning We will continue to manage her blood pressure with medication adjustment

## 2019-09-27 NOTE — Assessment & Plan Note (Signed)
Liver enzymes are within normal limits Observe closely

## 2019-10-02 ENCOUNTER — Other Ambulatory Visit: Payer: Self-pay | Admitting: Hematology and Oncology

## 2019-10-04 ENCOUNTER — Other Ambulatory Visit: Payer: Self-pay

## 2019-10-04 ENCOUNTER — Telehealth: Payer: Self-pay | Admitting: *Deleted

## 2019-10-04 ENCOUNTER — Inpatient Hospital Stay: Payer: Medicare Other

## 2019-10-04 ENCOUNTER — Other Ambulatory Visit: Payer: Medicare Other

## 2019-10-04 ENCOUNTER — Ambulatory Visit: Payer: Medicare Other

## 2019-10-04 ENCOUNTER — Other Ambulatory Visit: Payer: Self-pay | Admitting: Hematology and Oncology

## 2019-10-04 VITALS — BP 162/69 | HR 56 | Temp 98.4°F

## 2019-10-04 DIAGNOSIS — Z5111 Encounter for antineoplastic chemotherapy: Secondary | ICD-10-CM | POA: Diagnosis not present

## 2019-10-04 DIAGNOSIS — Z7189 Other specified counseling: Secondary | ICD-10-CM

## 2019-10-04 DIAGNOSIS — C561 Malignant neoplasm of right ovary: Secondary | ICD-10-CM

## 2019-10-04 DIAGNOSIS — C787 Secondary malignant neoplasm of liver and intrahepatic bile duct: Secondary | ICD-10-CM

## 2019-10-04 LAB — COMPREHENSIVE METABOLIC PANEL
ALT: 21 U/L (ref 0–44)
AST: 28 U/L (ref 15–41)
Albumin: 3.4 g/dL — ABNORMAL LOW (ref 3.5–5.0)
Alkaline Phosphatase: 47 U/L (ref 38–126)
Anion gap: 9 (ref 5–15)
BUN: 20 mg/dL (ref 8–23)
CO2: 26 mmol/L (ref 22–32)
Calcium: 9 mg/dL (ref 8.9–10.3)
Chloride: 107 mmol/L (ref 98–111)
Creatinine, Ser: 0.78 mg/dL (ref 0.44–1.00)
GFR calc Af Amer: >60 mL/min (ref >60)
GFR calc non Af Amer: >60 mL/min (ref >60)
Glucose, Bld: 98 mg/dL (ref 70–99)
Potassium: 4.1 mmol/L (ref 3.5–5.1)
Sodium: 142 mmol/L (ref 135–145)
Total Bilirubin: 0.4 mg/dL (ref 0.3–1.2)
Total Protein: 6 g/dL — ABNORMAL LOW (ref 6.5–8.1)

## 2019-10-04 LAB — CBC WITH DIFFERENTIAL/PLATELET
Abs Immature Granulocytes: 0.02 10*3/uL (ref 0.00–0.07)
Basophils Absolute: 0 10*3/uL (ref 0.0–0.1)
Basophils Relative: 2 %
Eosinophils Absolute: 0.1 10*3/uL (ref 0.0–0.5)
Eosinophils Relative: 2 %
HCT: 34 % — ABNORMAL LOW (ref 36.0–46.0)
Hemoglobin: 11.1 g/dL — ABNORMAL LOW (ref 12.0–15.0)
Immature Granulocytes: 1 %
Lymphocytes Relative: 37 %
Lymphs Abs: 0.9 10*3/uL (ref 0.7–4.0)
MCH: 30.3 pg (ref 26.0–34.0)
MCHC: 32.6 g/dL (ref 30.0–36.0)
MCV: 92.9 fL (ref 80.0–100.0)
Monocytes Absolute: 0.2 10*3/uL (ref 0.1–1.0)
Monocytes Relative: 10 %
Neutro Abs: 1.2 10*3/uL — ABNORMAL LOW (ref 1.7–7.7)
Neutrophils Relative %: 48 %
Platelets: 112 10*3/uL — ABNORMAL LOW (ref 150–400)
RBC: 3.66 MIL/uL — ABNORMAL LOW (ref 3.87–5.11)
RDW: 14.3 % (ref 11.5–15.5)
WBC: 2.5 10*3/uL — ABNORMAL LOW (ref 4.0–10.5)
nRBC: 0 % (ref 0.0–0.2)

## 2019-10-04 MED ORDER — FAMOTIDINE IN NACL 20-0.9 MG/50ML-% IV SOLN
20.0000 mg | Freq: Once | INTRAVENOUS | Status: AC
  Administered 2019-10-04: 20 mg via INTRAVENOUS

## 2019-10-04 MED ORDER — SODIUM CHLORIDE 0.9% FLUSH
10.0000 mL | Freq: Once | INTRAVENOUS | Status: AC
  Administered 2019-10-04: 10 mL
  Filled 2019-10-04: qty 10

## 2019-10-04 MED ORDER — FAMOTIDINE IN NACL 20-0.9 MG/50ML-% IV SOLN
INTRAVENOUS | Status: AC
  Filled 2019-10-04: qty 50

## 2019-10-04 MED ORDER — HEPARIN SOD (PORK) LOCK FLUSH 100 UNIT/ML IV SOLN
500.0000 [IU] | Freq: Once | INTRAVENOUS | Status: AC | PRN
  Administered 2019-10-04: 500 [IU]
  Filled 2019-10-04: qty 5

## 2019-10-04 MED ORDER — SODIUM CHLORIDE 0.9% FLUSH
10.0000 mL | INTRAVENOUS | Status: DC | PRN
  Administered 2019-10-04: 12:00:00 10 mL
  Filled 2019-10-04: qty 10

## 2019-10-04 MED ORDER — DEXAMETHASONE SODIUM PHOSPHATE 10 MG/ML IJ SOLN
10.0000 mg | Freq: Once | INTRAMUSCULAR | Status: AC
  Administered 2019-10-04: 10 mg via INTRAVENOUS

## 2019-10-04 MED ORDER — DIPHENHYDRAMINE HCL 50 MG/ML IJ SOLN
25.0000 mg | Freq: Once | INTRAMUSCULAR | Status: AC
  Administered 2019-10-04: 25 mg via INTRAVENOUS

## 2019-10-04 MED ORDER — DEXAMETHASONE SODIUM PHOSPHATE 10 MG/ML IJ SOLN
INTRAMUSCULAR | Status: AC
  Filled 2019-10-04: qty 1

## 2019-10-04 MED ORDER — SODIUM CHLORIDE 0.9 % IV SOLN
Freq: Once | INTRAVENOUS | Status: AC
  Administered 2019-10-04: 10:00:00 via INTRAVENOUS
  Filled 2019-10-04: qty 250

## 2019-10-04 MED ORDER — SODIUM CHLORIDE 0.9 % IV SOLN
72.0000 mg/m2 | Freq: Once | INTRAVENOUS | Status: AC
  Administered 2019-10-04: 120 mg via INTRAVENOUS
  Filled 2019-10-04: qty 20

## 2019-10-04 MED ORDER — DIPHENHYDRAMINE HCL 50 MG/ML IJ SOLN
INTRAMUSCULAR | Status: AC
  Filled 2019-10-04: qty 1

## 2019-10-04 NOTE — Telephone Encounter (Signed)
Per Dr. Alvy Bimler, Okay to treat with mild pancytopenia. Charge nurse called and made aware.

## 2019-10-04 NOTE — Patient Instructions (Signed)

## 2019-10-04 NOTE — Patient Instructions (Signed)
[place] Discharge Instructions for Patients Receiving Chemotherapy  Today you received the following chemotherapy agents:  Taxol  To help prevent nausea and vomiting after your treatment, we encourage you to take your nausea medication as needed & prescribed   If you develop nausea and vomiting that is not controlled by your nausea medication, call the clinic.   BELOW ARE SYMPTOMS THAT SHOULD BE REPORTED IMMEDIATELY:  *FEVER GREATER THAN 100.5 F  *CHILLS WITH OR WITHOUT FEVER  NAUSEA AND VOMITING THAT IS NOT CONTROLLED WITH YOUR NAUSEA MEDICATION  *UNUSUAL SHORTNESS OF BREATH  *UNUSUAL BRUISING OR BLEEDING  TENDERNESS IN MOUTH AND THROAT WITH OR WITHOUT PRESENCE OF ULCERS  *URINARY PROBLEMS  *BOWEL PROBLEMS  UNUSUAL RASH Items with * indicate a potential emergency and should be followed up as soon as possible.  Feel free to call the clinic should you have any questions or concerns. The clinic phone number is [phone].  Please show the [facility] at check-in to the Emergency Department and triage nurse.

## 2019-10-15 ENCOUNTER — Encounter (HOSPITAL_COMMUNITY): Payer: Self-pay

## 2019-10-15 ENCOUNTER — Ambulatory Visit (HOSPITAL_COMMUNITY)
Admission: RE | Admit: 2019-10-15 | Discharge: 2019-10-15 | Disposition: A | Discharge status: Home / Self Care | Payer: Medicare Other | Source: Ambulatory Visit | Attending: Hematology and Oncology | Admitting: Hematology and Oncology

## 2019-10-15 ENCOUNTER — Encounter: Payer: Self-pay | Admitting: Hematology and Oncology

## 2019-10-15 ENCOUNTER — Other Ambulatory Visit: Payer: Self-pay

## 2019-10-15 DIAGNOSIS — C561 Malignant neoplasm of right ovary: Secondary | ICD-10-CM | POA: Diagnosis present

## 2019-10-15 HISTORY — DX: Essential (primary) hypertension: I10

## 2019-10-15 HISTORY — DX: Secondary malignant neoplasm of liver and intrahepatic bile duct: C78.7

## 2019-10-15 MED ORDER — IOHEXOL 9 MG/ML PO SOLN
1000.0000 mL | ORAL | Status: AC
  Administered 2019-10-15: 1000 mL via ORAL

## 2019-10-15 MED ORDER — IOHEXOL 300 MG/ML  SOLN
100.0000 mL | Freq: Once | INTRAMUSCULAR | Status: AC | PRN
  Administered 2019-10-15: 100 mL via INTRAVENOUS

## 2019-10-15 MED ORDER — SODIUM CHLORIDE (PF) 0.9 % IJ SOLN
INTRAMUSCULAR | Status: AC
  Filled 2019-10-15: qty 50

## 2019-10-15 MED ORDER — HEPARIN SOD (PORK) LOCK FLUSH 100 UNIT/ML IV SOLN
500.0000 [IU] | Freq: Once | INTRAVENOUS | Status: AC
  Administered 2019-10-15: 10:00:00 500 [IU] via INTRAVENOUS

## 2019-10-15 MED ORDER — IOHEXOL 9 MG/ML PO SOLN
ORAL | Status: AC
  Filled 2019-10-15: qty 1000

## 2019-10-15 MED ORDER — HEPARIN SOD (PORK) LOCK FLUSH 100 UNIT/ML IV SOLN
INTRAVENOUS | Status: AC
  Administered 2019-10-15: 500 [IU] via INTRAVENOUS
  Filled 2019-10-15: qty 5

## 2019-10-16 ENCOUNTER — Encounter: Payer: Self-pay | Admitting: Hematology and Oncology

## 2019-10-16 ENCOUNTER — Other Ambulatory Visit: Payer: Self-pay

## 2019-10-16 ENCOUNTER — Inpatient Hospital Stay: Payer: Medicare Other | Admitting: Hematology and Oncology

## 2019-10-16 DIAGNOSIS — I10 Essential (primary) hypertension: Secondary | ICD-10-CM

## 2019-10-16 DIAGNOSIS — C561 Malignant neoplasm of right ovary: Secondary | ICD-10-CM

## 2019-10-16 DIAGNOSIS — Z5111 Encounter for antineoplastic chemotherapy: Secondary | ICD-10-CM | POA: Diagnosis not present

## 2019-10-16 DIAGNOSIS — C787 Secondary malignant neoplasm of liver and intrahepatic bile duct: Secondary | ICD-10-CM

## 2019-10-16 MED ORDER — ATENOLOL 25 MG PO TABS: 25.0000 mg | ORAL_TABLET | Freq: Every day | ORAL | 9 refills | Status: DC

## 2019-10-16 NOTE — Progress Notes (Signed)
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   INTERVAL HISTORY: Please see below for problem oriented charting. She returns for further follow-up She tolerated chemotherapy well recently without major side effects No worsening neuropathy Her blood pressure control at home is satisfactory  SUMMARY OF ONCOLOGIC HISTORY: Oncology History Overview Note  Neg genetics. 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 In a postmenopausal female, consider continued annual imaging followup with CT or MRI versus surgical evaluation.  Colonic diverticulosis. No radiographic evidence of diverticulitis.  Cholelithiasis.  No radiographic evidence of cholecystitis.  Small hiatal hernia.   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  The tumor involves serosal surface of the right ovary and adjacent right fallopian  tube.  Cervix and endometrium were within normal limits. ER positive Peritoneal washing was positive.   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   08/13/2015 Tumor Marker   Patient's tumor was tested for the following markers: CA-125 Results of the tumor marker test revealed 96   08/25/2015 - 12/08/2015 Chemotherapy   The patient had 6 cycles of carboplatin and taxol   09/07/2015 Tumor Marker   Patient's tumor was tested for the following markers: CA-125 Results of the tumor marker test revealed 51   10/05/2015 Tumor Marker   Patient's tumor was tested for the following markers: CA-125 Results of the tumor marker test revealed 29   11/09/2015 Tumor Marker   Patient's tumor was tested for the following markers: CA-125 Results of the tumor marker test revealed 44   12/08/2015 Tumor Marker   Patient's tumor was tested for the following markers: CA-125 Results of the tumor marker test revealed 30   12/15/2015 Genetic Testing   Genetics testing normal by [facility] Breast Ovarian panel 12-15-15   01/11/2016 Imaging   1. The only finding of note are several adjacent peripheral abnormal hypodensities along the anterior superior spleen margin, differential diagnostic considerations including small peripheral interval splenic infarcts, splenic injury with subcapsular a fluid collections, or less likely metastatic disease to the splenic margin. This likely merits observation. 2. Other imaging findings of potential clinical significance: Small type 1 hiatal hernia. Aortoiliac atherosclerotic vascular disease. Lower lumbar spondylosis and degenerative disc disease. Gallstones with potential mild gallbladder wall  thickening.   03/07/2016 Tumor Marker   Patient's tumor was tested  for the following markers: CA-125 Results of the tumor marker test revealed 24.2   04/06/2016 Imaging   1. No evidence of a ovarian cancer metastasis. 2. No ascites. 3. Post hysterectomy and oophorectomy. 4. Cholelithiasis with several large gallstones   04/06/2016 Tumor Marker   Patient's tumor was tested for the following markers: CA-125 Results of the tumor marker test revealed 22.8   05/30/2016 Tumor Marker   Patient's tumor was tested for the following markers: CA-125 Results of the tumor marker test revealed 21   09/07/2016 Tumor Marker   Patient's tumor was tested for the following markers: CA-125 Results of the tumor marker test revealed 22.4   11/28/2016 Tumor Marker   Patient's tumor was tested for the following markers: CA-125 Results of the tumor marker test revealed 18.8   02/23/2017 Tumor Marker   Patient's tumor was tested for the following markers: CA-125 Results of the tumor marker test revealed 19.1   06/02/2017 Tumor Marker   Patient's tumor was tested for the following markers: CA-125 Results of the tumor marker test revealed 18.3   08/24/2017 Mammogram   Pt reports mammogram complete   08/28/2017 Tumor Marker   Patient's tumor was tested for the following markers: CA-125 Results of the tumor marker test revealed 21.1   12/01/2017 Tumor Marker   Patient's tumor was tested for the following markers: CA-125 Results of the tumor marker test revealed 31.2   12/13/2017 Imaging   New 2.7 cm peritoneal soft tissue mass in anterior left lower quadrant, consistent with metastatic disease.  No other sites of metastatic disease identified.  Incidental findings including: Cholelithiasis. Colonic diverticulosis. Tiny hiatal hernia. Aortic atherosclerosis.   01/09/2018 - 05/07/2018 Chemotherapy   The patient had carboplatin and taxol; After 2nd cycle, she developed carboplatin allergy. She received carboplatin desensitization protocol at [facility] for final 4 cycles, completed  by 6/17. Avastin was added from 04/16/18 onwards   01/30/2018 Adverse Reaction   Potential carboplatin allergy is suspected. She received half the dose of prescribed carboplatin on cycle 2   05/31/2018 Tumor Marker   Patient's tumor was tested for the following markers: CA-125 Results of the tumor marker test revealed 26   05/31/2018 Imaging   1. Peritoneal soft tissue nodule identified on the previous study has decreased in the interval the. No progressive findings in the abdomen or pelvis on today's study to suggest disease progression. 2. Tiny pulmonary nodules, likely benign. Attention on follow-up recommended. 3. Cholelithiasis. 4.  Aortic Atherosclerois (ICD10-170.0) 5. Tiny hiatal hernia.     06/04/2018 -  Chemotherapy   The patient is placed on Avastin for maintenance   06/25/2018 Tumor Marker   Patient's tumor was tested for the following markers: CA-125 Results of the tumor marker test revealed 22.5   08/06/2018 Tumor Marker   Patient's tumor was tested for the following markers: CA-125 Results of the tumor marker test revealed 20.7   09/14/2018 Imaging   Status post hysterectomy and bilateral salpingo-oophorectomy.  Stable soft tissue nodule beneath the left lower anterior abdominal wall, likely reflecting stable peritoneal disease.  Scattered small subpleural nodules in the lungs bilaterally, measuring up to 3 mm, technically indeterminate although likely benign. Please note that Fleischner Society guidelines do not apply. Attention on follow-up is suggested.  No evidence of new/progressive metastatic disease.   09/14/2018 Tumor Marker   Patient's tumor was tested for the following markers: CA-125 Results of the tumor  marker test revealed 21.3   10/30/2018 Tumor Marker   Patient's tumor was tested for the following markers: CA-125 Results of the tumor marker test revealed 20.8   12/12/2018 Tumor Marker   Patient's tumor was tested for the following markers:  CA-125 Results of the tumor marker test revealed 19.7   01/01/2019 Tumor Marker   Patient's tumor was tested for the following markers: CA-125 Results of the tumor marker test revealed 21.6   01/22/2019 Tumor Marker   Patient's tumor was tested for the following markers: CA-125 Results of the tumor marker test revealed 21.3   02/12/2019 Tumor Marker   Patient's tumor was tested for the following markers: CA-125 Results of the tumor marker test revealed 21.6   04/16/2019 Tumor Marker   Patient's tumor was tested for the following markers: CA-125 Results of the tumor marker test revealed 21.7   05/07/2019 Tumor Marker   Patient's tumor was tested for the following markers: CA-125 Results of the tumor marker test revealed 27.3   05/28/2019 Tumor Marker   Patient's tumor was tested for the following markers: CA-125 Results of the tumor marker test revealed 24.5   07/09/2019 - 08/19/2019 Chemotherapy   The patient had bevacizumab for chemotherapy treatment.     07/09/2019 Tumor Marker   Patient's tumor was tested for the following markers: CA-125 Results of the tumor marker test revealed 23.3.   08/06/2019 Imaging   Ct abdomen and pelvis 1. New hypodense lesions of the left lobe of the liver measuring 2.5 x 2.5 cm (series 2, image 16) and right lobe of the liver measuring 1.1 x 1.1 cm (series 2, image 23), concerning for metastatic disease.   2. Interval enlargement of an irregular nodule of the omentum measuring 1.0 x 1.0 cm, previously 1.0 x 0.5 cm (series 2, image 51), concerning for peritoneal disease.   3.  Status post hysterectomy.   4. Other chronic and incidental findings as detailed above. Aortic Atherosclerosis (ICD10-I70.0).   08/16/2019 -  Chemotherapy   The patient had weekly Taxol for chemotherapy treatment.     08/23/2019 Tumor Marker   Patient's tumor was tested for the following markers: CA-125 Results of the tumor marker test revealed 33.7.   09/06/2019 Tumor  Marker   Patient's tumor was tested for the following markers: CA-125 Results of the tumor marker test revealed 32.2   10/14/2019 Imaging   1. Interval decrease in hypodense masses of the left lobe and right lobe of the liver, mass in the left lobe measuring 1.7 x 1.6 cm, previously 2.5 x 2.5 cm (series 2, image 14) and in the right lobe of the liver measuring 0.8 x 0.7 cm, previously 1.1 x 1.1 cm (series 2, image 22).   2. Interval decrease in size of an omental nodule, measuring 0.8 x 0.7 cm, previously 1.0 x 1.0 cm (series 2, image 55).   3. Findings are consistent with improved metastatic disease. No new evidence of metastatic disease in the abdomen or pelvis.   4.  Status post hysterectomy.   5.  Cholelithiasis.   6.  Aortic Atherosclerosis (ICD10-I70.0).     REVIEW OF SYSTEMS:   Constitutional: Denies fevers, chills or abnormal weight loss Eyes: Denies blurriness of vision Ears, nose, mouth, throat, and face: Denies mucositis or sore throat Respiratory: Denies cough, dyspnea or wheezes Cardiovascular: Denies palpitation, chest discomfort or lower extremity swelling Gastrointestinal:  Denies nausea, heartburn or change in bowel habits Skin: Denies abnormal skin rashes Lymphatics: Denies new lymphadenopathy  or easy bruising Neurological:Denies numbness, tingling or new weaknesses Behavioral/Psych: Mood is stable, no new changes  All other systems were reviewed with the patient and are negative.  I have reviewed the past medical history, past surgical history, social history and family history with the patient and they are unchanged from previous note.  ALLERGIES:  is allergic to carboplatin.  MEDICATIONS:  Current Outpatient Medications  Medication Sig Dispense Refill  . amLODipine (NORVASC) 10 MG tablet Take 1 tablet (10 mg total) by mouth daily. 90 tablet 3  . atenolol (TENORMIN) 25 MG tablet Take 1 tablet (25 mg total) by mouth daily. 30 tablet 9  . atorvastatin  (LIPITOR) 10 MG tablet Take 1 tablet (10 mg total) by mouth daily. 30 tablet 2  . Coenzyme Q10 (CO Q 10) 100 MG CAPS Take 1 capsule by mouth daily.     . Glucosamine-Chondroit-Vit C-Mn (GLUCOSAMINE 1500 COMPLEX PO) Take 1 capsule by mouth 2 (two) times daily.    [name] lidocaine-prilocaine (EMLA) cream Apply to affected area once 30 g 3  . loratadine (CLARITIN) 10 MG tablet Take 10 mg by mouth daily as needed for allergies (hives).    . Multiple Vitamin (MULTIVITAMIN) capsule Take 1 capsule by mouth daily.     . Omega-3 Fatty Acids (OMEGA-3 FISH OIL) 300 MG CAPS Take 1 capsule by mouth daily.     . ondansetron (ZOFRAN) 8 MG tablet Take 1 tablet (8 mg total) by mouth every 8 (eight) hours as needed for refractory nausea / vomiting. 30 tablet 1  . Polyethyl Glycol-Propyl Glycol (SYSTANE ULTRA OP) Apply 1 drop to eye 2 (two) times daily at 10 AM and 5 PM.    . Probiotic Product (PROBIOTIC ADVANCED PO) Take 1 capsule by mouth daily.    . prochlorperazine (COMPAZINE) 10 MG tablet Take 1 tablet (10 mg total) by mouth every 6 (six) hours as needed (Nausea or vomiting). 60 tablet 1  . vitamin E 400 UNIT capsule Take 400 Units by mouth every evening.      No current facility-administered medications for this visit.     PHYSICAL EXAMINATION: ECOG PERFORMANCE STATUS: 1 - Symptomatic but completely ambulatory  Vitals:   10/16/19 0958  BP: 134/65  Pulse: 62  Resp: 17  Temp: (!) 97 F (36.1 C)  SpO2: 100%   Filed Weights   10/16/19 0958  Weight: 142 lb 1.6 oz (64.5 kg)    GENERAL:alert, no distress and comfortable Musculoskeletal:no cyanosis of digits and no clubbing  NEURO: alert & oriented x 3 with fluent speech, no focal motor/sensory deficits  LABORATORY DATA:  I have reviewed the data as listed    Component Value Date/Time   NA 142 10/04/2019 0835   NA 142 11/28/2016 0916   K 4.1 10/04/2019 0835   K 3.9 11/28/2016 0916   CL 107 10/04/2019 0835   CO2 26 10/04/2019 0835   CO2 28  11/28/2016 0916   GLUCOSE 98 10/04/2019 0835   GLUCOSE 78 11/28/2016 0916   BUN 20 10/04/2019 0835   BUN 16.8 11/28/2016 0916   CREATININE 0.78 10/04/2019 0835   CREATININE 1.15 (H) 05/28/2019 1345   CREATININE 0.8 11/28/2016 0916   CALCIUM 9.0 10/04/2019 0835   CALCIUM 10.5 (H) 11/28/2016 0916   PROT 6.0 (L) 10/04/2019 0835   PROT 7.3 11/28/2016 0916   ALBUMIN 3.4 (L) 10/04/2019 0835   ALBUMIN 4.3 11/28/2016 0916   AST 28 10/04/2019 0835   AST 23 05/28/2019 1345   AST  19 11/28/2016 0916   ALT 21 10/04/2019 0835   ALT 14 05/28/2019 1345   ALT 19 11/28/2016 0916   ALKPHOS 47 10/04/2019 0835   ALKPHOS 50 11/28/2016 0916   BILITOT 0.4 10/04/2019 0835   BILITOT 0.3 05/28/2019 1345   BILITOT 0.48 11/28/2016 0916   GFRNONAA >60 10/04/2019 0835   GFRNONAA 46 (L) 05/28/2019 1345   GFRAA >60 10/04/2019 0835   GFRAA 53 (L) 05/28/2019 1345    No results found for: SPEP, UPEP  Lab Results  Component Value Date   WBC 2.5 (L) 10/04/2019   NEUTROABS 1.2 (L) 10/04/2019   HGB 11.1 (L) 10/04/2019   HCT 34.0 (L) 10/04/2019   MCV 92.9 10/04/2019   PLT 112 (L) 10/04/2019      Chemistry      Component Value Date/Time   NA 142 10/04/2019 0835   NA 142 11/28/2016 0916   K 4.1 10/04/2019 0835   K 3.9 11/28/2016 0916   CL 107 10/04/2019 0835   CO2 26 10/04/2019 0835   CO2 28 11/28/2016 0916   BUN 20 10/04/2019 0835   BUN 16.8 11/28/2016 0916   CREATININE 0.78 10/04/2019 0835   CREATININE 1.15 (H) 05/28/2019 1345   CREATININE 0.8 11/28/2016 0916      Component Value Date/Time   CALCIUM 9.0 10/04/2019 0835   CALCIUM 10.5 (H) 11/28/2016 0916   ALKPHOS 47 10/04/2019 0835   ALKPHOS 50 11/28/2016 0916   AST 28 10/04/2019 0835   AST 23 05/28/2019 1345   AST 19 11/28/2016 0916   ALT 21 10/04/2019 0835   ALT 14 05/28/2019 1345   ALT 19 11/28/2016 0916   BILITOT 0.4 10/04/2019 0835   BILITOT 0.3 05/28/2019 1345   BILITOT 0.48 11/28/2016 0916       RADIOGRAPHIC STUDIES: I have  reviewed imaging studies with the patient I have personally reviewed the radiological images as listed and agreed with the findings in the report. Ct Abdomen Pelvis W Contrast  Result Date: 10/15/2019 CLINICAL DATA:  Recurrent metastatic ovarian cancer EXAM: CT ABDOMEN AND PELVIS WITH CONTRAST TECHNIQUE: Multidetector CT imaging of the abdomen and pelvis was performed using the standard protocol following bolus administration of intravenous contrast. CONTRAST:  126m OMNIPAQUE IOHEXOL 300 MG/ML SOLN, additional oral enteric contrast COMPARISON:  08/06/2019, 09/14/2018 FINDINGS: Lower chest: No acute abnormality.  Small hiatal hernia. Hepatobiliary: Interval decrease in hypodense masses of the left lobe and right lobe of the liver, mass in the left lobe measuring 1.7 x 1.6 cm, previously 2.5 x 2.5 cm (series 2, image 14) and in the right lobe of the liver measuring 0.8 x 0.7 cm, previously 1.1 x 1.1 cm (series 2, image 22). Large rim calcified gallstones. No biliary ductal dilatation. Pancreas: Unremarkable. No pancreatic ductal dilatation or surrounding inflammatory changes. Spleen: Normal in size without significant abnormality. Adrenals/Urinary Tract: Adrenal glands are unremarkable. Kidneys are normal, without renal calculi, solid lesion, or hydronephrosis. Bladder is unremarkable. Stomach/Bowel: Stomach is within normal limits. Appendix appears normal. No evidence of bowel wall thickening, distention, or inflammatory changes. Sigmoid diverticulosis. Vascular/Lymphatic: Aortic atherosclerosis. No enlarged abdominal or pelvic lymph nodes. Reproductive: Status post hysterectomy. Other: No abdominal wall hernia or abnormality. Interval decrease in size of an omental nodule, measuring 0.8 x 0.7 cm, previously 1.0 x 1.0 cm (series 2, image 55). No abdominopelvic ascites. Musculoskeletal: No acute or significant osseous findings. IMPRESSION: 1. Interval decrease in hypodense masses of the left lobe and right lobe  of the liver, mass  in the left lobe measuring 1.7 x 1.6 cm, previously 2.5 x 2.5 cm (series 2, image 14) and in the right lobe of the liver measuring 0.8 x 0.7 cm, previously 1.1 x 1.1 cm (series 2, image 22). 2. Interval decrease in size of an omental nodule, measuring 0.8 x 0.7 cm, previously 1.0 x 1.0 cm (series 2, image 55). 3. Findings are consistent with improved metastatic disease. No new evidence of metastatic disease in the abdomen or pelvis. 4.  Status post hysterectomy. 5.  Cholelithiasis. 6.  Aortic Atherosclerosis (ICD10-I70.0). Electronically Signed   By: Eddie Candle M.D.   On: 10/15/2019 12:05    All questions were answered. The patient knows to call the clinic with any problems, questions or concerns. No barriers to learning was detected.  I spent 15 minutes counseling the patient face to face. The total time spent in the appointment was 20 minutes and more than 50% was on counseling and review of test results  Heath Lark, MD 10/16/2019 12:02 PM

## 2019-10-16 NOTE — Assessment & Plan Note (Signed)
The size of liver metastases improving Continue chemotherapy as above

## 2019-10-16 NOTE — Assessment & Plan Note (Signed)
I have reviewed blood work and imaging studies with the patient Her tumor marker is not helpful so I will stop CA-125 monitoring She will continue on weekly Taxol, given on days 1 and 8, rest day 15 for cycle of every 21 days We will repeat imaging study again in 3 months, due around end of February 2021

## 2019-10-16 NOTE — Assessment & Plan Note (Signed)
Her blood pressure management at home is satisfactory We will continue the same

## 2019-10-18 ENCOUNTER — Telehealth: Payer: Self-pay | Admitting: Hematology and Oncology

## 2019-10-18 NOTE — Telephone Encounter (Signed)
Added December appointments - dates per 11/25 schedule message. Confirmed with patient.

## 2019-10-25 ENCOUNTER — Other Ambulatory Visit: Payer: Self-pay

## 2019-10-25 ENCOUNTER — Inpatient Hospital Stay: Payer: Medicare Other

## 2019-10-25 ENCOUNTER — Inpatient Hospital Stay: Payer: Medicare Other | Attending: Gynecology

## 2019-10-25 VITALS — BP 162/64 | HR 53 | Temp 98.0°F | Resp 18

## 2019-10-25 DIAGNOSIS — C561 Malignant neoplasm of right ovary: Secondary | ICD-10-CM | POA: Insufficient documentation

## 2019-10-25 DIAGNOSIS — Z5111 Encounter for antineoplastic chemotherapy: Secondary | ICD-10-CM | POA: Insufficient documentation

## 2019-10-25 DIAGNOSIS — C787 Secondary malignant neoplasm of liver and intrahepatic bile duct: Secondary | ICD-10-CM | POA: Insufficient documentation

## 2019-10-25 DIAGNOSIS — Z7189 Other specified counseling: Secondary | ICD-10-CM

## 2019-10-25 LAB — CBC WITH DIFFERENTIAL/PLATELET
Abs Immature Granulocytes: 0.01 10*3/uL (ref 0.00–0.07)
Basophils Absolute: 0.1 10*3/uL (ref 0.0–0.1)
Basophils Relative: 1 %
Eosinophils Absolute: 0.2 10*3/uL (ref 0.0–0.5)
Eosinophils Relative: 4 %
HCT: 38 % (ref 36.0–46.0)
Hemoglobin: 12.3 g/dL (ref 12.0–15.0)
Immature Granulocytes: 0 %
Lymphocytes Relative: 29 %
Lymphs Abs: 1.1 10*3/uL (ref 0.7–4.0)
MCH: 30.6 pg (ref 26.0–34.0)
MCHC: 32.4 g/dL (ref 30.0–36.0)
MCV: 94.5 fL (ref 80.0–100.0)
Monocytes Absolute: 0.6 10*3/uL (ref 0.1–1.0)
Monocytes Relative: 14 %
Neutro Abs: 2 10*3/uL (ref 1.7–7.7)
Neutrophils Relative %: 52 %
Platelets: 117 10*3/uL — ABNORMAL LOW (ref 150–400)
RBC: 4.02 MIL/uL (ref 3.87–5.11)
RDW: 15.7 % — ABNORMAL HIGH (ref 11.5–15.5)
WBC: 3.9 10*3/uL — ABNORMAL LOW (ref 4.0–10.5)
nRBC: 0 % (ref 0.0–0.2)

## 2019-10-25 LAB — COMPREHENSIVE METABOLIC PANEL
ALT: 23 U/L (ref 0–44)
AST: 25 U/L (ref 15–41)
Albumin: 3.5 g/dL (ref 3.5–5.0)
Alkaline Phosphatase: 54 U/L (ref 38–126)
Anion gap: 9 (ref 5–15)
BUN: 26 mg/dL — ABNORMAL HIGH (ref 8–23)
CO2: 24 mmol/L (ref 22–32)
Calcium: 8.8 mg/dL — ABNORMAL LOW (ref 8.9–10.3)
Chloride: 109 mmol/L (ref 98–111)
Creatinine, Ser: 0.79 mg/dL (ref 0.44–1.00)
GFR calc Af Amer: >60 mL/min (ref >60)
GFR calc non Af Amer: >60 mL/min (ref >60)
Glucose, Bld: 105 mg/dL — ABNORMAL HIGH (ref 70–99)
Potassium: 4 mmol/L (ref 3.5–5.1)
Sodium: 142 mmol/L (ref 135–145)
Total Bilirubin: 0.4 mg/dL (ref 0.3–1.2)
Total Protein: 6.4 g/dL — ABNORMAL LOW (ref 6.5–8.1)

## 2019-10-25 MED ORDER — FAMOTIDINE IN NACL 20-0.9 MG/50ML-% IV SOLN
INTRAVENOUS | Status: AC
  Filled 2019-10-25: qty 50

## 2019-10-25 MED ORDER — SODIUM CHLORIDE 0.9% FLUSH
10.0000 mL | Freq: Once | INTRAVENOUS | Status: AC
  Administered 2019-10-25: 12:00:00 10 mL
  Filled 2019-10-25: qty 10

## 2019-10-25 MED ORDER — DEXAMETHASONE SODIUM PHOSPHATE 10 MG/ML IJ SOLN
10.0000 mg | Freq: Once | INTRAMUSCULAR | Status: AC
  Administered 2019-10-25: 10 mg via INTRAVENOUS

## 2019-10-25 MED ORDER — DIPHENHYDRAMINE HCL 50 MG/ML IJ SOLN
25.0000 mg | Freq: Once | INTRAMUSCULAR | Status: AC
  Administered 2019-10-25: 25 mg via INTRAVENOUS

## 2019-10-25 MED ORDER — DEXAMETHASONE SODIUM PHOSPHATE 10 MG/ML IJ SOLN
INTRAMUSCULAR | Status: AC
  Filled 2019-10-25: qty 1

## 2019-10-25 MED ORDER — SODIUM CHLORIDE 0.9 % IV SOLN
72.0000 mg/m2 | Freq: Once | INTRAVENOUS | Status: AC
  Administered 2019-10-25: 120 mg via INTRAVENOUS
  Filled 2019-10-25: qty 20

## 2019-10-25 MED ORDER — SODIUM CHLORIDE 0.9% FLUSH
10.0000 mL | INTRAVENOUS | Status: DC | PRN
  Administered 2019-10-25: 10 mL
  Filled 2019-10-25: qty 10

## 2019-10-25 MED ORDER — FAMOTIDINE IN NACL 20-0.9 MG/50ML-% IV SOLN
20.0000 mg | Freq: Once | INTRAVENOUS | Status: AC
  Administered 2019-10-25: 20 mg via INTRAVENOUS

## 2019-10-25 MED ORDER — HEPARIN SOD (PORK) LOCK FLUSH 100 UNIT/ML IV SOLN
500.0000 [IU] | Freq: Once | INTRAVENOUS | Status: AC | PRN
  Administered 2019-10-25: 500 [IU]
  Filled 2019-10-25: qty 5

## 2019-10-25 MED ORDER — SODIUM CHLORIDE 0.9 % IV SOLN
Freq: Once | INTRAVENOUS | Status: AC
  Administered 2019-10-25: 14:00:00 via INTRAVENOUS
  Filled 2019-10-25: qty 250

## 2019-10-25 MED ORDER — DIPHENHYDRAMINE HCL 50 MG/ML IJ SOLN
INTRAMUSCULAR | Status: AC
  Filled 2019-10-25: qty 1

## 2019-11-01 ENCOUNTER — Encounter: Payer: Self-pay | Admitting: Hematology and Oncology

## 2019-11-01 ENCOUNTER — Inpatient Hospital Stay: Payer: Medicare Other

## 2019-11-01 ENCOUNTER — Other Ambulatory Visit: Payer: Self-pay

## 2019-11-01 ENCOUNTER — Inpatient Hospital Stay: Payer: Medicare Other | Admitting: Hematology and Oncology

## 2019-11-01 DIAGNOSIS — D61818 Other pancytopenia: Secondary | ICD-10-CM | POA: Diagnosis not present

## 2019-11-01 DIAGNOSIS — C787 Secondary malignant neoplasm of liver and intrahepatic bile duct: Secondary | ICD-10-CM | POA: Diagnosis not present

## 2019-11-01 DIAGNOSIS — C561 Malignant neoplasm of right ovary: Secondary | ICD-10-CM

## 2019-11-01 DIAGNOSIS — Z7189 Other specified counseling: Secondary | ICD-10-CM

## 2019-11-01 DIAGNOSIS — Z5111 Encounter for antineoplastic chemotherapy: Secondary | ICD-10-CM | POA: Diagnosis not present

## 2019-11-01 DIAGNOSIS — I10 Essential (primary) hypertension: Secondary | ICD-10-CM | POA: Diagnosis not present

## 2019-11-01 LAB — COMPREHENSIVE METABOLIC PANEL
ALT: 19 U/L (ref 0–44)
AST: 25 U/L (ref 15–41)
Albumin: 3.5 g/dL (ref 3.5–5.0)
Alkaline Phosphatase: 48 U/L (ref 38–126)
Anion gap: 6 (ref 5–15)
BUN: 29 mg/dL — ABNORMAL HIGH (ref 8–23)
CO2: 24 mmol/L (ref 22–32)
Calcium: 8.8 mg/dL — ABNORMAL LOW (ref 8.9–10.3)
Chloride: 110 mmol/L (ref 98–111)
Creatinine, Ser: 0.8 mg/dL (ref 0.44–1.00)
GFR calc Af Amer: >60 mL/min (ref >60)
GFR calc non Af Amer: >60 mL/min (ref >60)
Glucose, Bld: 97 mg/dL (ref 70–99)
Potassium: 4.1 mmol/L (ref 3.5–5.1)
Sodium: 140 mmol/L (ref 135–145)
Total Bilirubin: 0.4 mg/dL (ref 0.3–1.2)
Total Protein: 6 g/dL — ABNORMAL LOW (ref 6.5–8.1)

## 2019-11-01 LAB — CBC WITH DIFFERENTIAL/PLATELET
Abs Immature Granulocytes: 0.02 10*3/uL (ref 0.00–0.07)
Basophils Absolute: 0 10*3/uL (ref 0.0–0.1)
Basophils Relative: 1 %
Eosinophils Absolute: 0.2 10*3/uL (ref 0.0–0.5)
Eosinophils Relative: 4 %
HCT: 35.7 % — ABNORMAL LOW (ref 36.0–46.0)
Hemoglobin: 11.6 g/dL — ABNORMAL LOW (ref 12.0–15.0)
Immature Granulocytes: 1 %
Lymphocytes Relative: 29 %
Lymphs Abs: 1.1 10*3/uL (ref 0.7–4.0)
MCH: 30.5 pg (ref 26.0–34.0)
MCHC: 32.5 g/dL (ref 30.0–36.0)
MCV: 93.9 fL (ref 80.0–100.0)
Monocytes Absolute: 0.3 10*3/uL (ref 0.1–1.0)
Monocytes Relative: 7 %
Neutro Abs: 2.1 10*3/uL (ref 1.7–7.7)
Neutrophils Relative %: 58 %
Platelets: 118 10*3/uL — ABNORMAL LOW (ref 150–400)
RBC: 3.8 MIL/uL — ABNORMAL LOW (ref 3.87–5.11)
RDW: 15.1 % (ref 11.5–15.5)
WBC: 3.7 10*3/uL — ABNORMAL LOW (ref 4.0–10.5)
nRBC: 0 % (ref 0.0–0.2)

## 2019-11-01 MED ORDER — SODIUM CHLORIDE 0.9 % IV SOLN
72.0000 mg/m2 | Freq: Once | INTRAVENOUS | Status: AC
  Administered 2019-11-01: 120 mg via INTRAVENOUS
  Filled 2019-11-01: qty 20

## 2019-11-01 MED ORDER — SODIUM CHLORIDE 0.9 % IV SOLN
Freq: Once | INTRAVENOUS | Status: AC
  Administered 2019-11-01: 12:00:00 via INTRAVENOUS
  Filled 2019-11-01: qty 250

## 2019-11-01 MED ORDER — SODIUM CHLORIDE 0.9% FLUSH
10.0000 mL | INTRAVENOUS | Status: DC | PRN
  Administered 2019-11-01: 14:00:00 10 mL
  Filled 2019-11-01: qty 10

## 2019-11-01 MED ORDER — DEXAMETHASONE SODIUM PHOSPHATE 10 MG/ML IJ SOLN
INTRAMUSCULAR | Status: AC
  Filled 2019-11-01: qty 1

## 2019-11-01 MED ORDER — SODIUM CHLORIDE 0.9% FLUSH
10.0000 mL | Freq: Once | INTRAVENOUS | Status: AC
  Administered 2019-11-01: 10:00:00 10 mL
  Filled 2019-11-01: qty 10

## 2019-11-01 MED ORDER — FAMOTIDINE IN NACL 20-0.9 MG/50ML-% IV SOLN
20.0000 mg | Freq: Once | INTRAVENOUS | Status: AC
  Administered 2019-11-01: 20 mg via INTRAVENOUS

## 2019-11-01 MED ORDER — DIPHENHYDRAMINE HCL 50 MG/ML IJ SOLN
INTRAMUSCULAR | Status: AC
  Filled 2019-11-01: qty 1

## 2019-11-01 MED ORDER — HEPARIN SOD (PORK) LOCK FLUSH 100 UNIT/ML IV SOLN
500.0000 [IU] | Freq: Once | INTRAVENOUS | Status: AC | PRN
  Administered 2019-11-01: 500 [IU]
  Filled 2019-11-01: qty 5

## 2019-11-01 MED ORDER — DEXAMETHASONE SODIUM PHOSPHATE 10 MG/ML IJ SOLN
10.0000 mg | Freq: Once | INTRAMUSCULAR | Status: AC
  Administered 2019-11-01: 10 mg via INTRAVENOUS

## 2019-11-01 MED ORDER — DIPHENHYDRAMINE HCL 50 MG/ML IJ SOLN
25.0000 mg | Freq: Once | INTRAMUSCULAR | Status: AC
  Administered 2019-11-01: 25 mg via INTRAVENOUS

## 2019-11-01 MED ORDER — FAMOTIDINE IN NACL 20-0.9 MG/50ML-% IV SOLN
INTRAVENOUS | Status: AC
  Filled 2019-11-01: qty 50

## 2019-11-01 NOTE — Patient Instructions (Signed)
[place] Discharge Instructions for Patients Receiving Chemotherapy  Today you received the following chemotherapy agents:  Taxol  To help prevent nausea and vomiting after your treatment, we encourage you to take your nausea medication as needed & prescribed   If you develop nausea and vomiting that is not controlled by your nausea medication, call the clinic.   BELOW ARE SYMPTOMS THAT SHOULD BE REPORTED IMMEDIATELY:  *FEVER GREATER THAN 100.5 F  *CHILLS WITH OR WITHOUT FEVER  NAUSEA AND VOMITING THAT IS NOT CONTROLLED WITH YOUR NAUSEA MEDICATION  *UNUSUAL SHORTNESS OF BREATH  *UNUSUAL BRUISING OR BLEEDING  TENDERNESS IN MOUTH AND THROAT WITH OR WITHOUT PRESENCE OF ULCERS  *URINARY PROBLEMS  *BOWEL PROBLEMS  UNUSUAL RASH Items with * indicate a potential emergency and should be followed up as soon as possible.  Feel free to call the clinic should you have any questions or concerns. The clinic phone number is [phone].  Please show the [facility] at check-in to the Emergency Department and triage nurse.

## 2019-11-01 NOTE — Progress Notes (Signed)
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[place]) I have reviewed blood work and imaging studies with the patient Her tumor marker is not helpful so I will stopped CA-125 monitoring She will continue on weekly Taxol, given on days 1 and 8, rest day 15 for cycle of every 21 days We will repeat imaging study again in 3 months, due around end of February 2021  Pancytopenia, acquired [facility]) She is noted to have progressive pancytopenia We will continue with recent dose changes She is not symptomatic  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[facility]) The size of liver metastases improving Her liver enzymes are stable Continue chemotherapy as above  Essential hypertension Her blood pressure management at home is satisfactory We will continue the same   No orders of the defined types were placed in this encounter.   INTERVAL HISTORY: Please see below for problem oriented charting. She returns for chemotherapy and follow-up She denies worsening neuropathy No recent changes in bowel habits No recent infection, fever or chills  SUMMARY OF ONCOLOGIC HISTORY: Oncology History Overview Note  Neg genetics. 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 In a postmenopausal female, consider continued annual imaging followup with CT or MRI versus surgical evaluation.  Colonic diverticulosis. No radiographic evidence of diverticulitis.  Cholelithiasis.  No radiographic evidence of cholecystitis.  Small hiatal hernia.   07/30/2015 Pathology Results   Outside pathology showed right  ovary with high-grade serous carcinoma, 2 cm in maximum dimension.  The tumor involves serosal surface of the right ovary and adjacent right fallopian tube.  Cervix and endometrium were within normal limits. ER positive Peritoneal washing was positive.   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   08/13/2015 Tumor Marker   Patient's tumor was tested for the following markers: CA-125 Results of the tumor marker test revealed 96   08/25/2015 - 12/08/2015 Chemotherapy   The patient had 6 cycles of carboplatin and taxol   09/07/2015 Tumor Marker   Patient's tumor was tested for the following markers: CA-125 Results of the tumor marker test revealed 51   10/05/2015 Tumor Marker   Patient's tumor was tested for the following markers: CA-125 Results of the tumor marker test revealed 29   11/09/2015 Tumor Marker   Patient's tumor was tested for the following markers: CA-125 Results of the tumor marker test revealed 44   12/08/2015 Tumor Marker   Patient's tumor was tested for the following markers: CA-125 Results of the tumor marker test revealed 30   12/15/2015 Genetic Testing   Genetics testing normal by [facility] Breast Ovarian panel 12-15-15   01/11/2016 Imaging   1. The only finding of note are several adjacent peripheral abnormal hypodensities along the anterior superior spleen margin, differential diagnostic considerations including small peripheral interval splenic infarcts, splenic injury with subcapsular a fluid collections, or less likely metastatic disease to the splenic margin. This likely merits observation. 2. Other imaging findings of potential  clinical significance: Small type 1 hiatal hernia. Aortoiliac atherosclerotic vascular disease. Lower lumbar  spondylosis and degenerative disc disease. Gallstones with potential mild gallbladder wall thickening.   03/07/2016 Tumor Marker   Patient's tumor was tested for the following markers: CA-125 Results of the tumor marker test revealed 24.2   04/06/2016 Imaging   1. No evidence of a ovarian cancer metastasis. 2. No ascites. 3. Post hysterectomy and oophorectomy. 4. Cholelithiasis with several large gallstones   04/06/2016 Tumor Marker   Patient's tumor was tested for the following markers: CA-125 Results of the tumor marker test revealed 22.8   05/30/2016 Tumor Marker   Patient's tumor was tested for the following markers: CA-125 Results of the tumor marker test revealed 21   09/07/2016 Tumor Marker   Patient's tumor was tested for the following markers: CA-125 Results of the tumor marker test revealed 22.4   11/28/2016 Tumor Marker   Patient's tumor was tested for the following markers: CA-125 Results of the tumor marker test revealed 18.8   02/23/2017 Tumor Marker   Patient's tumor was tested for the following markers: CA-125 Results of the tumor marker test revealed 19.1   06/02/2017 Tumor Marker   Patient's tumor was tested for the following markers: CA-125 Results of the tumor marker test revealed 18.3   08/24/2017 Mammogram   Pt reports mammogram complete   08/28/2017 Tumor Marker   Patient's tumor was tested for the following markers: CA-125 Results of the tumor marker test revealed 21.1   12/01/2017 Tumor Marker   Patient's tumor was tested for the following markers: CA-125 Results of the tumor marker test revealed 31.2   12/13/2017 Imaging   New 2.7 cm peritoneal soft tissue mass in anterior left lower quadrant, consistent with metastatic disease.  No other sites of metastatic disease identified.  Incidental findings including: Cholelithiasis. Colonic diverticulosis. Tiny hiatal hernia. Aortic atherosclerosis.   01/09/2018 - 05/07/2018 Chemotherapy   The patient had  carboplatin and taxol; After 2nd cycle, she developed carboplatin allergy. She received carboplatin desensitization protocol at [facility] for final 4 cycles, completed by 6/17. Avastin was added from 04/16/18 onwards   01/30/2018 Adverse Reaction   Potential carboplatin allergy is suspected. She received half the dose of prescribed carboplatin on cycle 2   05/31/2018 Tumor Marker   Patient's tumor was tested for the following markers: CA-125 Results of the tumor marker test revealed 26   05/31/2018 Imaging   1. Peritoneal soft tissue nodule identified on the previous study has decreased in the interval the. No progressive findings in the abdomen or pelvis on today's study to suggest disease progression. 2. Tiny pulmonary nodules, likely benign. Attention on follow-up recommended. 3. Cholelithiasis. 4.  Aortic Atherosclerois (ICD10-170.0) 5. Tiny hiatal hernia.     06/04/2018 -  Chemotherapy   The patient is placed on Avastin for maintenance   06/25/2018 Tumor Marker   Patient's tumor was tested for the following markers: CA-125 Results of the tumor marker test revealed 22.5   08/06/2018 Tumor Marker   Patient's tumor was tested for the following markers: CA-125 Results of the tumor marker test revealed 20.7   09/14/2018 Imaging   Status post hysterectomy and bilateral salpingo-oophorectomy.  Stable soft tissue nodule beneath the left lower anterior abdominal wall, likely reflecting stable peritoneal disease.  Scattered small subpleural nodules in the lungs bilaterally, measuring up to 3 mm, technically indeterminate although likely benign. Please note that Fleischner Society guidelines do not apply. Attention on follow-up is suggested.  No evidence  of new/progressive metastatic disease.   09/14/2018 Tumor Marker   Patient's tumor was tested for the following markers: CA-125 Results of the tumor marker test revealed 21.3   10/30/2018 Tumor Marker   Patient's tumor was tested for the  following markers: CA-125 Results of the tumor marker test revealed 20.8   12/12/2018 Tumor Marker   Patient's tumor was tested for the following markers: CA-125 Results of the tumor marker test revealed 19.7   01/01/2019 Tumor Marker   Patient's tumor was tested for the following markers: CA-125 Results of the tumor marker test revealed 21.6   01/22/2019 Tumor Marker   Patient's tumor was tested for the following markers: CA-125 Results of the tumor marker test revealed 21.3   02/12/2019 Tumor Marker   Patient's tumor was tested for the following markers: CA-125 Results of the tumor marker test revealed 21.6   04/16/2019 Tumor Marker   Patient's tumor was tested for the following markers: CA-125 Results of the tumor marker test revealed 21.7   05/07/2019 Tumor Marker   Patient's tumor was tested for the following markers: CA-125 Results of the tumor marker test revealed 27.3   05/28/2019 Tumor Marker   Patient's tumor was tested for the following markers: CA-125 Results of the tumor marker test revealed 24.5   07/09/2019 - 08/19/2019 Chemotherapy   The patient had bevacizumab for chemotherapy treatment.     07/09/2019 Tumor Marker   Patient's tumor was tested for the following markers: CA-125 Results of the tumor marker test revealed 23.3.   08/06/2019 Imaging   Ct abdomen and pelvis 1. New hypodense lesions of the left lobe of the liver measuring 2.5 x 2.5 cm (series 2, image 16) and right lobe of the liver measuring 1.1 x 1.1 cm (series 2, image 23), concerning for metastatic disease.   2. Interval enlargement of an irregular nodule of the omentum measuring 1.0 x 1.0 cm, previously 1.0 x 0.5 cm (series 2, image 51), concerning for peritoneal disease.   3.  Status post hysterectomy.   4. Other chronic and incidental findings as detailed above. Aortic Atherosclerosis (ICD10-I70.0).   08/16/2019 -  Chemotherapy   The patient had weekly Taxol for chemotherapy treatment.      08/23/2019 Tumor Marker   Patient's tumor was tested for the following markers: CA-125 Results of the tumor marker test revealed 33.7.   09/06/2019 Tumor Marker   Patient's tumor was tested for the following markers: CA-125 Results of the tumor marker test revealed 32.2   10/14/2019 Imaging   1. Interval decrease in hypodense masses of the left lobe and right lobe of the liver, mass in the left lobe measuring 1.7 x 1.6 cm, previously 2.5 x 2.5 cm (series 2, image 14) and in the right lobe of the liver measuring 0.8 x 0.7 cm, previously 1.1 x 1.1 cm (series 2, image 22).   2. Interval decrease in size of an omental nodule, measuring 0.8 x 0.7 cm, previously 1.0 x 1.0 cm (series 2, image 55).   3. Findings are consistent with improved metastatic disease. No new evidence of metastatic disease in the abdomen or pelvis.   4.  Status post hysterectomy.   5.  Cholelithiasis.   6.  Aortic Atherosclerosis (ICD10-I70.0).     REVIEW OF SYSTEMS:   Constitutional: Denies fevers, chills or abnormal weight loss Eyes: Denies blurriness of vision Ears, nose, mouth, throat, and face: Denies mucositis or sore throat Respiratory: Denies cough, dyspnea or wheezes Cardiovascular: Denies palpitation, chest  discomfort or lower extremity swelling Gastrointestinal:  Denies nausea, heartburn or change in bowel habits Skin: Denies abnormal skin rashes Lymphatics: Denies new lymphadenopathy or easy bruising Neurological:Denies numbness, tingling or new weaknesses Behavioral/Psych: Mood is stable, no new changes  All other systems were reviewed with the patient and are negative.  I have reviewed the past medical history, past surgical history, social history and family history with the patient and they are unchanged from previous note.  ALLERGIES:  is allergic to carboplatin.  MEDICATIONS:  Current Outpatient Medications  Medication Sig Dispense Refill  . amLODipine (NORVASC) 10 MG tablet Take 1 tablet  (10 mg total) by mouth daily. 90 tablet 3  . atenolol (TENORMIN) 25 MG tablet Take 1 tablet (25 mg total) by mouth daily. 30 tablet 9  . atorvastatin (LIPITOR) 10 MG tablet Take 1 tablet (10 mg total) by mouth daily. 30 tablet 2  . Coenzyme Q10 (CO Q 10) 100 MG CAPS Take 1 capsule by mouth daily.     . Glucosamine-Chondroit-Vit C-Mn (GLUCOSAMINE 1500 COMPLEX PO) Take 1 capsule by mouth 2 (two) times daily.    [name] lidocaine-prilocaine (EMLA) cream Apply to affected area once 30 g 3  . loratadine (CLARITIN) 10 MG tablet Take 10 mg by mouth daily as needed for allergies (hives).    . Multiple Vitamin (MULTIVITAMIN) capsule Take 1 capsule by mouth daily.     . Omega-3 Fatty Acids (OMEGA-3 FISH OIL) 300 MG CAPS Take 1 capsule by mouth daily.     . ondansetron (ZOFRAN) 8 MG tablet Take 1 tablet (8 mg total) by mouth every 8 (eight) hours as needed for refractory nausea / vomiting. 30 tablet 1  . Polyethyl Glycol-Propyl Glycol (SYSTANE ULTRA OP) Apply 1 drop to eye 2 (two) times daily at 10 AM and 5 PM.    . Probiotic Product (PROBIOTIC ADVANCED PO) Take 1 capsule by mouth daily.    . prochlorperazine (COMPAZINE) 10 MG tablet Take 1 tablet (10 mg total) by mouth every 6 (six) hours as needed (Nausea or vomiting). 60 tablet 1  . vitamin E 400 UNIT capsule Take 400 Units by mouth every evening.      No current facility-administered medications for this visit.    PHYSICAL EXAMINATION: ECOG PERFORMANCE STATUS: 1 - Symptomatic but completely ambulatory  Vitals:   11/01/19 1127  BP: 116/77  Pulse: 79  Resp: 18  Temp: 97.8 F (36.6 C)  SpO2: 100%   Filed Weights   11/01/19 1127  Weight: 140 lb 12.8 oz (63.9 kg)    GENERAL:alert, no distress and comfortable SKIN: skin color, texture, turgor are normal, no rashes or significant lesions EYES: normal, Conjunctiva are pink and non-injected, sclera clear OROPHARYNX:no exudate, no erythema and lips, buccal mucosa, and tongue normal  NECK: supple,  thyroid normal size, non-tender, without nodularity LYMPH:  no palpable lymphadenopathy in the cervical, axillary or inguinal LUNGS: clear to auscultation and percussion with normal breathing effort HEART: regular rate & rhythm and no murmurs and no lower extremity edema ABDOMEN:abdomen soft, non-tender and normal bowel sounds Musculoskeletal:no cyanosis of digits and no clubbing  NEURO: alert & oriented x 3 with fluent speech, no focal motor/sensory deficits  LABORATORY DATA:  I have reviewed the data as listed    Component Value Date/Time   NA 140 11/01/2019 1025   NA 142 11/28/2016 0916   K 4.1 11/01/2019 1025   K 3.9 11/28/2016 0916   CL 110 11/01/2019 1025   CO2 24 11/01/2019  1025   CO2 28 11/28/2016 0916   GLUCOSE 97 11/01/2019 1025   GLUCOSE 78 11/28/2016 0916   BUN 29 (H) 11/01/2019 1025   BUN 16.8 11/28/2016 0916   CREATININE 0.80 11/01/2019 1025   CREATININE 1.15 (H) 05/28/2019 1345   CREATININE 0.8 11/28/2016 0916   CALCIUM 8.8 (L) 11/01/2019 1025   CALCIUM 10.5 (H) 11/28/2016 0916   PROT 6.0 (L) 11/01/2019 1025   PROT 7.3 11/28/2016 0916   ALBUMIN 3.5 11/01/2019 1025   ALBUMIN 4.3 11/28/2016 0916   AST 25 11/01/2019 1025   AST 23 05/28/2019 1345   AST 19 11/28/2016 0916   ALT 19 11/01/2019 1025   ALT 14 05/28/2019 1345   ALT 19 11/28/2016 0916   ALKPHOS 48 11/01/2019 1025   ALKPHOS 50 11/28/2016 0916   BILITOT 0.4 11/01/2019 1025   BILITOT 0.3 05/28/2019 1345   BILITOT 0.48 11/28/2016 0916   GFRNONAA >60 11/01/2019 1025   GFRNONAA 46 (L) 05/28/2019 1345   GFRAA >60 11/01/2019 1025   GFRAA 53 (L) 05/28/2019 1345    No results found for: SPEP, UPEP  Lab Results  Component Value Date   WBC 3.7 (L) 11/01/2019   NEUTROABS 2.1 11/01/2019   HGB 11.6 (L) 11/01/2019   HCT 35.7 (L) 11/01/2019   MCV 93.9 11/01/2019   PLT 118 (L) 11/01/2019      Chemistry      Component Value Date/Time   NA 140 11/01/2019 1025   NA 142 11/28/2016 0916   K 4.1  11/01/2019 1025   K 3.9 11/28/2016 0916   CL 110 11/01/2019 1025   CO2 24 11/01/2019 1025   CO2 28 11/28/2016 0916   BUN 29 (H) 11/01/2019 1025   BUN 16.8 11/28/2016 0916   CREATININE 0.80 11/01/2019 1025   CREATININE 1.15 (H) 05/28/2019 1345   CREATININE 0.8 11/28/2016 0916      Component Value Date/Time   CALCIUM 8.8 (L) 11/01/2019 1025   CALCIUM 10.5 (H) 11/28/2016 0916   ALKPHOS 48 11/01/2019 1025   ALKPHOS 50 11/28/2016 0916   AST 25 11/01/2019 1025   AST 23 05/28/2019 1345   AST 19 11/28/2016 0916   ALT 19 11/01/2019 1025   ALT 14 05/28/2019 1345   ALT 19 11/28/2016 0916   BILITOT 0.4 11/01/2019 1025   BILITOT 0.3 05/28/2019 1345   BILITOT 0.48 11/28/2016 0916       RADIOGRAPHIC STUDIES: I have personally reviewed the radiological images as listed and agreed with the findings in the report. CT Abdomen Pelvis W Contrast  Result Date: 10/15/2019 CLINICAL DATA:  Recurrent metastatic ovarian cancer EXAM: CT ABDOMEN AND PELVIS WITH CONTRAST TECHNIQUE: Multidetector CT imaging of the abdomen and pelvis was performed using the standard protocol following bolus administration of intravenous contrast. CONTRAST:  111m OMNIPAQUE IOHEXOL 300 MG/ML SOLN, additional oral enteric contrast COMPARISON:  08/06/2019, 09/14/2018 FINDINGS: Lower chest: No acute abnormality.  Small hiatal hernia. Hepatobiliary: Interval decrease in hypodense masses of the left lobe and right lobe of the liver, mass in the left lobe measuring 1.7 x 1.6 cm, previously 2.5 x 2.5 cm (series 2, image 14) and in the right lobe of the liver measuring 0.8 x 0.7 cm, previously 1.1 x 1.1 cm (series 2, image 22). Large rim calcified gallstones. No biliary ductal dilatation. Pancreas: Unremarkable. No pancreatic ductal dilatation or surrounding inflammatory changes. Spleen: Normal in size without significant abnormality. Adrenals/Urinary Tract: Adrenal glands are unremarkable. Kidneys are normal, without renal calculi, solid  lesion, or hydronephrosis. Bladder is unremarkable. Stomach/Bowel: Stomach is within normal limits. Appendix appears normal. No evidence of bowel wall thickening, distention, or inflammatory changes. Sigmoid diverticulosis. Vascular/Lymphatic: Aortic atherosclerosis. No enlarged abdominal or pelvic lymph nodes. Reproductive: Status post hysterectomy. Other: No abdominal wall hernia or abnormality. Interval decrease in size of an omental nodule, measuring 0.8 x 0.7 cm, previously 1.0 x 1.0 cm (series 2, image 55). No abdominopelvic ascites. Musculoskeletal: No acute or significant osseous findings. IMPRESSION: 1. Interval decrease in hypodense masses of the left lobe and right lobe of the liver, mass in the left lobe measuring 1.7 x 1.6 cm, previously 2.5 x 2.5 cm (series 2, image 14) and in the right lobe of the liver measuring 0.8 x 0.7 cm, previously 1.1 x 1.1 cm (series 2, image 22). 2. Interval decrease in size of an omental nodule, measuring 0.8 x 0.7 cm, previously 1.0 x 1.0 cm (series 2, image 55). 3. Findings are consistent with improved metastatic disease. No new evidence of metastatic disease in the abdomen or pelvis. 4.  Status post hysterectomy. 5.  Cholelithiasis. 6.  Aortic Atherosclerosis (ICD10-I70.0). Electronically Signed   By: Eddie Candle M.D.   On: 10/15/2019 12:05    All questions were answered. The patient knows to call the clinic with any problems, questions or concerns. No barriers to learning was detected.  I spent 15 minutes counseling the patient face to face. The total time spent in the appointment was 20 minutes and more than 50% was on counseling and review of test results  Heath Lark, MD 11/01/2019 11:33 AM

## 2019-11-01 NOTE — Assessment & Plan Note (Signed)
The size of liver metastases improving Her liver enzymes are stable Continue chemotherapy as above

## 2019-11-01 NOTE — Assessment & Plan Note (Signed)
She is noted to have progressive pancytopenia We will continue with recent dose changes She is not symptomatic

## 2019-11-01 NOTE — Assessment & Plan Note (Signed)
Her blood pressure management at home is satisfactory We will continue the same

## 2019-11-01 NOTE — Assessment & Plan Note (Signed)
I have reviewed blood work and imaging studies with the patient Her tumor marker is not helpful so I will stopped CA-125 monitoring She will continue on weekly Taxol, given on days 1 and 8, rest day 15 for cycle of every 21 days We will repeat imaging study again in 3 months, due around end of February 2021

## 2019-11-14 ENCOUNTER — Inpatient Hospital Stay: Payer: Medicare Other

## 2019-11-14 ENCOUNTER — Other Ambulatory Visit: Payer: Self-pay

## 2019-11-14 VITALS — BP 165/59 | HR 62 | Temp 97.8°F | Resp 18

## 2019-11-14 DIAGNOSIS — Z7189 Other specified counseling: Secondary | ICD-10-CM

## 2019-11-14 DIAGNOSIS — Z5111 Encounter for antineoplastic chemotherapy: Secondary | ICD-10-CM | POA: Diagnosis not present

## 2019-11-14 DIAGNOSIS — C787 Secondary malignant neoplasm of liver and intrahepatic bile duct: Secondary | ICD-10-CM

## 2019-11-14 DIAGNOSIS — C561 Malignant neoplasm of right ovary: Secondary | ICD-10-CM

## 2019-11-14 LAB — COMPREHENSIVE METABOLIC PANEL
ALT: 26 U/L (ref 0–44)
AST: 27 U/L (ref 15–41)
Albumin: 3.4 g/dL — ABNORMAL LOW (ref 3.5–5.0)
Alkaline Phosphatase: 50 U/L (ref 38–126)
Anion gap: 11 (ref 5–15)
BUN: 22 mg/dL (ref 8–23)
CO2: 21 mmol/L — ABNORMAL LOW (ref 22–32)
Calcium: 8.6 mg/dL — ABNORMAL LOW (ref 8.9–10.3)
Chloride: 109 mmol/L (ref 98–111)
Creatinine, Ser: 0.76 mg/dL (ref 0.44–1.00)
GFR calc Af Amer: >60 mL/min (ref >60)
GFR calc non Af Amer: >60 mL/min (ref >60)
Glucose, Bld: 97 mg/dL (ref 70–99)
Potassium: 4 mmol/L (ref 3.5–5.1)
Sodium: 141 mmol/L (ref 135–145)
Total Bilirubin: 0.3 mg/dL (ref 0.3–1.2)
Total Protein: 6.1 g/dL — ABNORMAL LOW (ref 6.5–8.1)

## 2019-11-14 LAB — CBC WITH DIFFERENTIAL/PLATELET
Abs Immature Granulocytes: 0 10*3/uL (ref 0.00–0.07)
Basophils Absolute: 0.1 10*3/uL (ref 0.0–0.1)
Basophils Relative: 2 %
Eosinophils Absolute: 0.1 10*3/uL (ref 0.0–0.5)
Eosinophils Relative: 2 %
HCT: 35.4 % — ABNORMAL LOW (ref 36.0–46.0)
Hemoglobin: 11.5 g/dL — ABNORMAL LOW (ref 12.0–15.0)
Immature Granulocytes: 0 %
Lymphocytes Relative: 36 %
Lymphs Abs: 1.1 10*3/uL (ref 0.7–4.0)
MCH: 30.4 pg (ref 26.0–34.0)
MCHC: 32.5 g/dL (ref 30.0–36.0)
MCV: 93.7 fL (ref 80.0–100.0)
Monocytes Absolute: 0.5 10*3/uL (ref 0.1–1.0)
Monocytes Relative: 17 %
Neutro Abs: 1.3 10*3/uL — ABNORMAL LOW (ref 1.7–7.7)
Neutrophils Relative %: 43 %
Platelets: 120 10*3/uL — ABNORMAL LOW (ref 150–400)
RBC: 3.78 MIL/uL — ABNORMAL LOW (ref 3.87–5.11)
RDW: 15.8 % — ABNORMAL HIGH (ref 11.5–15.5)
WBC: 3 10*3/uL — ABNORMAL LOW (ref 4.0–10.5)
nRBC: 0 % (ref 0.0–0.2)

## 2019-11-14 MED ORDER — SODIUM CHLORIDE 0.9% FLUSH
10.0000 mL | Freq: Once | INTRAVENOUS | Status: AC
  Administered 2019-11-14: 10 mL
  Filled 2019-11-14: qty 10

## 2019-11-14 MED ORDER — SODIUM CHLORIDE 0.9 % IV SOLN
Freq: Once | INTRAVENOUS | Status: AC
  Filled 2019-11-14: qty 250

## 2019-11-14 MED ORDER — SODIUM CHLORIDE 0.9 % IV SOLN
72.0000 mg/m2 | Freq: Once | INTRAVENOUS | Status: AC
  Administered 2019-11-14: 120 mg via INTRAVENOUS
  Filled 2019-11-14: qty 20

## 2019-11-14 MED ORDER — FAMOTIDINE IN NACL 20-0.9 MG/50ML-% IV SOLN
INTRAVENOUS | Status: AC
  Filled 2019-11-14: qty 50

## 2019-11-14 MED ORDER — HEPARIN SOD (PORK) LOCK FLUSH 100 UNIT/ML IV SOLN
500.0000 [IU] | Freq: Once | INTRAVENOUS | Status: AC | PRN
  Administered 2019-11-14: 500 [IU]
  Filled 2019-11-14: qty 5

## 2019-11-14 MED ORDER — FAMOTIDINE IN NACL 20-0.9 MG/50ML-% IV SOLN
20.0000 mg | Freq: Once | INTRAVENOUS | Status: AC
  Administered 2019-11-14: 20 mg via INTRAVENOUS

## 2019-11-14 MED ORDER — DEXAMETHASONE SODIUM PHOSPHATE 10 MG/ML IJ SOLN
INTRAMUSCULAR | Status: AC
  Filled 2019-11-14: qty 1

## 2019-11-14 MED ORDER — DIPHENHYDRAMINE HCL 50 MG/ML IJ SOLN
25.0000 mg | Freq: Once | INTRAMUSCULAR | Status: AC
  Administered 2019-11-14: 14:00:00 25 mg via INTRAVENOUS

## 2019-11-14 MED ORDER — DEXAMETHASONE SODIUM PHOSPHATE 10 MG/ML IJ SOLN
10.0000 mg | Freq: Once | INTRAMUSCULAR | Status: AC
  Administered 2019-11-14: 10 mg via INTRAVENOUS

## 2019-11-14 MED ORDER — SODIUM CHLORIDE 0.9% FLUSH
10.0000 mL | INTRAVENOUS | Status: DC | PRN
  Administered 2019-11-14: 10 mL
  Filled 2019-11-14: qty 10

## 2019-11-14 MED ORDER — DIPHENHYDRAMINE HCL 50 MG/ML IJ SOLN
INTRAMUSCULAR | Status: AC
  Filled 2019-11-14: qty 1

## 2019-11-14 NOTE — Patient Instructions (Signed)
[facility] Discharge Instructions for Patients Receiving Chemotherapy  Today you received the following chemotherapy agents:  Taxol  To help prevent nausea and vomiting after your treatment, we encourage you to take your nausea medication as needed & prescribed   If you develop nausea and vomiting that is not controlled by your nausea medication, call the clinic.   BELOW ARE SYMPTOMS THAT SHOULD BE REPORTED IMMEDIATELY:  *FEVER GREATER THAN 100.5 F  *CHILLS WITH OR WITHOUT FEVER  NAUSEA AND VOMITING THAT IS NOT CONTROLLED WITH YOUR NAUSEA MEDICATION  *UNUSUAL SHORTNESS OF BREATH  *UNUSUAL BRUISING OR BLEEDING  TENDERNESS IN MOUTH AND THROAT WITH OR WITHOUT PRESENCE OF ULCERS  *URINARY PROBLEMS  *BOWEL PROBLEMS  UNUSUAL RASH Items with * indicate a potential emergency and should be followed up as soon as possible.  Feel free to call the clinic should you have any questions or concerns. The clinic phone number is [phone].  Please show the [place] at check-in to the Emergency Department and triage nurse.

## 2019-11-20 ENCOUNTER — Encounter: Payer: Self-pay | Admitting: Hematology and Oncology

## 2019-11-21 ENCOUNTER — Other Ambulatory Visit: Payer: Self-pay

## 2019-11-21 ENCOUNTER — Inpatient Hospital Stay: Payer: Medicare Other

## 2019-11-21 ENCOUNTER — Encounter: Payer: Self-pay | Admitting: Hematology and Oncology

## 2019-11-21 ENCOUNTER — Telehealth: Payer: Self-pay | Admitting: Hematology and Oncology

## 2019-11-21 ENCOUNTER — Inpatient Hospital Stay: Payer: Medicare Other | Admitting: Hematology and Oncology

## 2019-11-21 DIAGNOSIS — C561 Malignant neoplasm of right ovary: Secondary | ICD-10-CM

## 2019-11-21 DIAGNOSIS — I10 Essential (primary) hypertension: Secondary | ICD-10-CM

## 2019-11-21 DIAGNOSIS — T451X5A Adverse effect of antineoplastic and immunosuppressive drugs, initial encounter: Secondary | ICD-10-CM

## 2019-11-21 DIAGNOSIS — D61818 Other pancytopenia: Secondary | ICD-10-CM

## 2019-11-21 DIAGNOSIS — Z5111 Encounter for antineoplastic chemotherapy: Secondary | ICD-10-CM | POA: Diagnosis not present

## 2019-11-21 DIAGNOSIS — G620 Drug-induced polyneuropathy: Secondary | ICD-10-CM | POA: Diagnosis not present

## 2019-11-21 DIAGNOSIS — C787 Secondary malignant neoplasm of liver and intrahepatic bile duct: Secondary | ICD-10-CM

## 2019-11-21 DIAGNOSIS — Z7189 Other specified counseling: Secondary | ICD-10-CM

## 2019-11-21 LAB — CBC WITH DIFFERENTIAL/PLATELET
Abs Immature Granulocytes: 0.02 10*3/uL (ref 0.00–0.07)
Basophils Absolute: 0 10*3/uL (ref 0.0–0.1)
Basophils Relative: 1 %
Eosinophils Absolute: 0.1 10*3/uL (ref 0.0–0.5)
Eosinophils Relative: 3 %
HCT: 33.6 % — ABNORMAL LOW (ref 36.0–46.0)
Hemoglobin: 10.9 g/dL — ABNORMAL LOW (ref 12.0–15.0)
Immature Granulocytes: 1 %
Lymphocytes Relative: 34 %
Lymphs Abs: 1.1 10*3/uL (ref 0.7–4.0)
MCH: 30.2 pg (ref 26.0–34.0)
MCHC: 32.4 g/dL (ref 30.0–36.0)
MCV: 93.1 fL (ref 80.0–100.0)
Monocytes Absolute: 0.3 10*3/uL (ref 0.1–1.0)
Monocytes Relative: 10 %
Neutro Abs: 1.6 10*3/uL — ABNORMAL LOW (ref 1.7–7.7)
Neutrophils Relative %: 51 %
Platelets: 122 10*3/uL — ABNORMAL LOW (ref 150–400)
RBC: 3.61 MIL/uL — ABNORMAL LOW (ref 3.87–5.11)
RDW: 15.4 % (ref 11.5–15.5)
WBC: 3.1 10*3/uL — ABNORMAL LOW (ref 4.0–10.5)
nRBC: 0 % (ref 0.0–0.2)

## 2019-11-21 LAB — COMPREHENSIVE METABOLIC PANEL
ALT: 23 U/L (ref 0–44)
AST: 25 U/L (ref 15–41)
Albumin: 3.3 g/dL — ABNORMAL LOW (ref 3.5–5.0)
Alkaline Phosphatase: 47 U/L (ref 38–126)
Anion gap: 8 (ref 5–15)
BUN: 23 mg/dL (ref 8–23)
CO2: 25 mmol/L (ref 22–32)
Calcium: 8.4 mg/dL — ABNORMAL LOW (ref 8.9–10.3)
Chloride: 107 mmol/L (ref 98–111)
Creatinine, Ser: 0.76 mg/dL (ref 0.44–1.00)
GFR calc Af Amer: >60 mL/min (ref >60)
GFR calc non Af Amer: >60 mL/min (ref >60)
Glucose, Bld: 90 mg/dL (ref 70–99)
Potassium: 4.2 mmol/L (ref 3.5–5.1)
Sodium: 140 mmol/L (ref 135–145)
Total Bilirubin: 0.5 mg/dL (ref 0.3–1.2)
Total Protein: 5.7 g/dL — ABNORMAL LOW (ref 6.5–8.1)

## 2019-11-21 MED ORDER — SODIUM CHLORIDE 0.9 % IV SOLN
72.0000 mg/m2 | Freq: Once | INTRAVENOUS | Status: AC
  Administered 2019-11-21: 120 mg via INTRAVENOUS
  Filled 2019-11-21: qty 20

## 2019-11-21 MED ORDER — DIPHENHYDRAMINE HCL 50 MG/ML IJ SOLN
25.0000 mg | Freq: Once | INTRAMUSCULAR | Status: AC
  Administered 2019-11-21: 25 mg via INTRAVENOUS

## 2019-11-21 MED ORDER — PALONOSETRON HCL INJECTION 0.25 MG/5ML
INTRAVENOUS | Status: AC
  Filled 2019-11-21: qty 5

## 2019-11-21 MED ORDER — SODIUM CHLORIDE 0.9 % IV SOLN
Freq: Once | INTRAVENOUS | Status: AC
  Filled 2019-11-21: qty 250

## 2019-11-21 MED ORDER — FAMOTIDINE IN NACL 20-0.9 MG/50ML-% IV SOLN
INTRAVENOUS | Status: AC
  Filled 2019-11-21: qty 50

## 2019-11-21 MED ORDER — HEPARIN SOD (PORK) LOCK FLUSH 100 UNIT/ML IV SOLN
500.0000 [IU] | Freq: Once | INTRAVENOUS | Status: AC | PRN
  Administered 2019-11-21: 500 [IU]
  Filled 2019-11-21: qty 5

## 2019-11-21 MED ORDER — DEXAMETHASONE SODIUM PHOSPHATE 10 MG/ML IJ SOLN
10.0000 mg | Freq: Once | INTRAMUSCULAR | Status: AC
  Administered 2019-11-21: 10 mg via INTRAVENOUS

## 2019-11-21 MED ORDER — SODIUM CHLORIDE 0.9% FLUSH
10.0000 mL | Freq: Once | INTRAVENOUS | Status: AC
  Administered 2019-11-21: 10 mL
  Filled 2019-11-21: qty 10

## 2019-11-21 MED ORDER — DEXAMETHASONE SODIUM PHOSPHATE 10 MG/ML IJ SOLN
INTRAMUSCULAR | Status: AC
  Filled 2019-11-21: qty 1

## 2019-11-21 MED ORDER — SODIUM CHLORIDE 0.9% FLUSH
10.0000 mL | INTRAVENOUS | Status: DC | PRN
  Administered 2019-11-21: 10 mL
  Filled 2019-11-21: qty 10

## 2019-11-21 MED ORDER — FAMOTIDINE IN NACL 20-0.9 MG/50ML-% IV SOLN
20.0000 mg | Freq: Once | INTRAVENOUS | Status: AC
  Administered 2019-11-21: 20 mg via INTRAVENOUS

## 2019-11-21 MED ORDER — DIPHENHYDRAMINE HCL 50 MG/ML IJ SOLN
INTRAMUSCULAR | Status: AC
  Filled 2019-11-21: qty 1

## 2019-11-21 MED ORDER — AMLODIPINE BESYLATE 10 MG PO TABS: 10.0000 mg | ORAL_TABLET | Freq: Every day | ORAL | 3 refills | Status: DC

## 2019-11-21 NOTE — Progress Notes (Signed)
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[facility]) Her recent imaging studies show positive response to therapy Her tumor marker is not helpful so I will stopped CA-125 monitoring She will continue on weekly Taxol, given on days 1 and 8, rest day 15 for cycle of every 21 days We will repeat imaging study again due around end of February 2021  Essential hypertension She has intermittent elevated blood pressure with occasional leg edema We discussed the use of diuretic therapy if needed She will call me if she wants to try diuresis to offload some of the extra fluid on board Alternatively, I can also switch her amlodipine in the future  Peripheral neuropathy due to chemotherapy [facility]) She denies worsening neuropathy while on treatment.  Observe closely for now  Pancytopenia, acquired [facility]) She is to have stable pancytopenia We will continue with recent dose changes She is not symptomatic   No orders of the defined types were placed in this encounter.   INTERVAL HISTORY: Please see below for problem oriented charting. She returns for further follow-up She tolerated treatment well except for mild leg edema, new changes, intermittent elevated blood pressure and some skin sensitivity She denies worsening neuropathy Her blood pressure is intermittently elevated with systolic blood pressure occasionally in the 140s or 150s.  Her appetite is fair No recent nausea or changes in bowel habits  SUMMARY OF ONCOLOGIC HISTORY: Oncology History Overview Note  Neg genetics. Additional tests revealed ER: 80%, PR 3% MSI stable   Right ovarian epithelial cancer ([place])  07/01/2015 Initial Diagnosis   She presented with postmenopausal bleeding   07/02/2015 Imaging   US pelvis showed bilateral ovarian cyst   07/13/2015 Imaging   5.2 cm left adnexal cystic lesion,  with indeterminate but probably benign characteristics. In a postmenopausal female, consider continued annual imaging followup with CT or MRI versus surgical evaluation.  Colonic diverticulosis. No radiographic evidence of diverticulitis.  Cholelithiasis.  No radiographic evidence of cholecystitis.  Small hiatal hernia.   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  The tumor involves serosal surface of the right ovary and adjacent right fallopian tube.  Cervix and endometrium were within normal limits. ER positive Peritoneal washing was positive.   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   08/13/2015 Tumor Marker   Patient's tumor was tested for the following markers: CA-125 Results of the tumor marker test revealed 96   08/25/2015 - 12/08/2015 Chemotherapy   The patient had 6 cycles of carboplatin and taxol   09/07/2015 Tumor Marker   Patient's tumor was tested for the following markers: CA-125 Results of the tumor marker test revealed 51   10/05/2015 Tumor Marker   Patient's tumor was tested for the following markers: CA-125 Results of the tumor marker test revealed 29   11/09/2015 Tumor Marker   Patient's tumor was tested for the following markers: CA-125 Results of the tumor marker test revealed 44   12/08/2015 Tumor Marker   Patient's tumor was tested for the following markers: CA-125 Results of the tumor marker test revealed 30   12/15/2015 Genetic Testing   Genetics testing normal by [employer]  Breast Ovarian panel 12-15-15   01/11/2016 Imaging   1. The only finding of note are several adjacent peripheral abnormal hypodensities along the anterior superior spleen  margin, differential diagnostic considerations including small peripheral interval splenic infarcts, splenic injury with subcapsular a fluid collections, or less likely metastatic disease to the splenic margin. This likely merits observation. 2. Other imaging findings of potential clinical significance: Small type 1 hiatal hernia. Aortoiliac atherosclerotic vascular disease. Lower lumbar spondylosis and degenerative disc disease. Gallstones with potential mild gallbladder wall thickening.   03/07/2016 Tumor Marker   Patient's tumor was tested for the following markers: CA-125 Results of the tumor marker test revealed 24.2   04/06/2016 Imaging   1. No evidence of a ovarian cancer metastasis. 2. No ascites. 3. Post hysterectomy and oophorectomy. 4. Cholelithiasis with several large gallstones   04/06/2016 Tumor Marker   Patient's tumor was tested for the following markers: CA-125 Results of the tumor marker test revealed 22.8   05/30/2016 Tumor Marker   Patient's tumor was tested for the following markers: CA-125 Results of the tumor marker test revealed 21   09/07/2016 Tumor Marker   Patient's tumor was tested for the following markers: CA-125 Results of the tumor marker test revealed 22.4   11/28/2016 Tumor Marker   Patient's tumor was tested for the following markers: CA-125 Results of the tumor marker test revealed 18.8   02/23/2017 Tumor Marker   Patient's tumor was tested for the following markers: CA-125 Results of the tumor marker test revealed 19.1   06/02/2017 Tumor Marker   Patient's tumor was tested for the following markers: CA-125 Results of the tumor marker test revealed 18.3   08/24/2017 Mammogram   Pt reports mammogram complete   08/28/2017 Tumor Marker   Patient's tumor was tested for the following markers: CA-125 Results of the tumor marker test revealed 21.1   12/01/2017 Tumor Marker   Patient's tumor was tested for the following markers: CA-125 Results of the  tumor marker test revealed 31.2   12/13/2017 Imaging   New 2.7 cm peritoneal soft tissue mass in anterior left lower quadrant, consistent with metastatic disease.  No other sites of metastatic disease identified.  Incidental findings including: Cholelithiasis. Colonic diverticulosis. Tiny hiatal hernia. Aortic atherosclerosis.   01/09/2018 - 05/07/2018 Chemotherapy   The patient had carboplatin and taxol; After 2nd cycle, she developed carboplatin allergy. She received carboplatin desensitization protocol at [facility] for final 4 cycles, completed by 6/17. Avastin was added from 04/16/18 onwards   01/30/2018 Adverse Reaction   Potential carboplatin allergy is suspected. She received half the dose of prescribed carboplatin on cycle 2   05/31/2018 Tumor Marker   Patient's tumor was tested for the following markers: CA-125 Results of the tumor marker test revealed 26   05/31/2018 Imaging   1. Peritoneal soft tissue nodule identified on the previous study has decreased in the interval the. No progressive findings in the abdomen or pelvis on today's study to suggest disease progression. 2. Tiny pulmonary nodules, likely benign. Attention on follow-up recommended. 3. Cholelithiasis. 4.  Aortic Atherosclerois (ICD10-170.0) 5. Tiny hiatal hernia.     06/04/2018 -  Chemotherapy   The patient is placed on Avastin for maintenance   06/25/2018 Tumor Marker   Patient's tumor was tested for the following markers: CA-125 Results of the tumor marker test revealed 22.5   08/06/2018 Tumor Marker   Patient's tumor was tested for the following markers: CA-125 Results of the tumor marker test revealed 20.7  09/14/2018 Imaging   Status post hysterectomy and bilateral salpingo-oophorectomy.  Stable soft tissue nodule beneath the left lower anterior abdominal wall, likely reflecting stable peritoneal disease.  Scattered small subpleural nodules in the lungs bilaterally, measuring up to 3 mm, technically  indeterminate although likely benign. Please note that Fleischner Society guidelines do not apply. Attention on follow-up is suggested.  No evidence of new/progressive metastatic disease.   09/14/2018 Tumor Marker   Patient's tumor was tested for the following markers: CA-125 Results of the tumor marker test revealed 21.3   10/30/2018 Tumor Marker   Patient's tumor was tested for the following markers: CA-125 Results of the tumor marker test revealed 20.8   12/12/2018 Tumor Marker   Patient's tumor was tested for the following markers: CA-125 Results of the tumor marker test revealed 19.7   01/01/2019 Tumor Marker   Patient's tumor was tested for the following markers: CA-125 Results of the tumor marker test revealed 21.6   01/22/2019 Tumor Marker   Patient's tumor was tested for the following markers: CA-125 Results of the tumor marker test revealed 21.3   02/12/2019 Tumor Marker   Patient's tumor was tested for the following markers: CA-125 Results of the tumor marker test revealed 21.6   04/16/2019 Tumor Marker   Patient's tumor was tested for the following markers: CA-125 Results of the tumor marker test revealed 21.7   05/07/2019 Tumor Marker   Patient's tumor was tested for the following markers: CA-125 Results of the tumor marker test revealed 27.3   05/28/2019 Tumor Marker   Patient's tumor was tested for the following markers: CA-125 Results of the tumor marker test revealed 24.5   07/09/2019 - 08/19/2019 Chemotherapy   The patient had bevacizumab for chemotherapy treatment.     07/09/2019 Tumor Marker   Patient's tumor was tested for the following markers: CA-125 Results of the tumor marker test revealed 23.3.   08/06/2019 Imaging   Ct abdomen and pelvis 1. New hypodense lesions of the left lobe of the liver measuring 2.5 x 2.5 cm (series 2, image 16) and right lobe of the liver measuring 1.1 x 1.1 cm (series 2, image 23), concerning for metastatic disease.   2.  Interval enlargement of an irregular nodule of the omentum measuring 1.0 x 1.0 cm, previously 1.0 x 0.5 cm (series 2, image 51), concerning for peritoneal disease.   3.  Status post hysterectomy.   4. Other chronic and incidental findings as detailed above. Aortic Atherosclerosis (ICD10-I70.0).   08/16/2019 -  Chemotherapy   The patient had weekly Taxol for chemotherapy treatment.     08/23/2019 Tumor Marker   Patient's tumor was tested for the following markers: CA-125 Results of the tumor marker test revealed 33.7.   09/06/2019 Tumor Marker   Patient's tumor was tested for the following markers: CA-125 Results of the tumor marker test revealed 32.2   10/14/2019 Imaging   1. Interval decrease in hypodense masses of the left lobe and right lobe of the liver, mass in the left lobe measuring 1.7 x 1.6 cm, previously 2.5 x 2.5 cm (series 2, image 14) and in the right lobe of the liver measuring 0.8 x 0.7 cm, previously 1.1 x 1.1 cm (series 2, image 22).   2. Interval decrease in size of an omental nodule, measuring 0.8 x 0.7 cm, previously 1.0 x 1.0 cm (series 2, image 55).   3. Findings are consistent with improved metastatic disease. No new evidence of metastatic disease in the abdomen or  pelvis.   4.  Status post hysterectomy.   5.  Cholelithiasis.   6.  Aortic Atherosclerosis (ICD10-I70.0).     REVIEW OF SYSTEMS:   Constitutional: Denies fevers, chills or abnormal weight loss Eyes: Denies blurriness of vision Ears, nose, mouth, throat, and face: Denies mucositis or sore throat Respiratory: Denies cough, dyspnea or wheezes Cardiovascular: Denies palpitation, chest discomfort Gastrointestinal:  Denies nausea, heartburn or change in bowel habits Skin: Denies abnormal skin rashes Lymphatics: Denies new lymphadenopathy or easy bruising Neurological:Denies numbness, tingling or new weaknesses Behavioral/Psych: Mood is stable, no new changes  All other systems were reviewed with  the patient and are negative.  I have reviewed the past medical history, past surgical history, social history and family history with the patient and they are unchanged from previous note.  ALLERGIES:  is allergic to carboplatin.  MEDICATIONS:  Current Outpatient Medications  Medication Sig Dispense Refill  . amLODipine (NORVASC) 10 MG tablet Take 1 tablet (10 mg total) by mouth daily. 90 tablet 3  . atenolol (TENORMIN) 25 MG tablet Take 1 tablet (25 mg total) by mouth daily. 30 tablet 9  . atorvastatin (LIPITOR) 10 MG tablet Take 1 tablet (10 mg total) by mouth daily. 30 tablet 2  . Coenzyme Q10 (CO Q 10) 100 MG CAPS Take 1 capsule by mouth daily.     . Glucosamine-Chondroit-Vit C-Mn (GLUCOSAMINE 1500 COMPLEX PO) Take 1 capsule by mouth 2 (two) times daily.    [name] lidocaine-prilocaine (EMLA) cream Apply to affected area once (Patient not taking: Reported on 11/14/2019) 30 g 3  . loratadine (CLARITIN) 10 MG tablet Take 10 mg by mouth daily as needed for allergies (hives).    . Multiple Vitamin (MULTIVITAMIN) capsule Take 1 capsule by mouth daily.     . Omega-3 Fatty Acids (OMEGA-3 FISH OIL) 300 MG CAPS Take 1 capsule by mouth daily.     . ondansetron (ZOFRAN) 8 MG tablet Take 1 tablet (8 mg total) by mouth every 8 (eight) hours as needed for refractory nausea / vomiting. 30 tablet 1  . Polyethyl Glycol-Propyl Glycol (SYSTANE ULTRA OP) Apply 1 drop to eye 2 (two) times daily at 10 AM and 5 PM.    . Probiotic Product (PROBIOTIC ADVANCED PO) Take 1 capsule by mouth daily.    . prochlorperazine (COMPAZINE) 10 MG tablet Take 1 tablet (10 mg total) by mouth every 6 (six) hours as needed (Nausea or vomiting). 60 tablet 1  . vitamin E 400 UNIT capsule Take 400 Units by mouth every evening.      No current facility-administered medications for this visit.   Facility-Administered Medications Ordered in Other Visits  Medication Dose Route Frequency Provider Last Rate Last Admin  . sodium chloride  flush (NS) 0.9 % injection 10 mL  10 mL Intracatheter PRN Alvy Bimler, Jarrett Albor, MD   10 mL at 11/21/19 1415    PHYSICAL EXAMINATION: ECOG PERFORMANCE STATUS: 1 - Symptomatic but completely ambulatory  Vitals:   11/21/19 1120  BP: (!) 150/60  Pulse: (!) 55  Resp: 18  Temp: 98.3 F (36.8 C)  SpO2: 100%   Filed Weights   11/21/19 1120  Weight: 144 lb (65.3 kg)    GENERAL:alert, no distress and comfortable SKIN: skin color, texture, turgor are normal, no rashes or significant lesions EYES: normal, Conjunctiva are pink and non-injected, sclera clear OROPHARYNX:no exudate, no erythema and lips, buccal mucosa, and tongue normal  NECK: supple, thyroid normal size, non-tender, without nodularity LYMPH:  no palpable lymphadenopathy  in the cervical, axillary or inguinal LUNGS: clear to auscultation and percussion with normal breathing effort HEART: regular rate & rhythm and no murmurs with mild bilateral lower extremity edema ABDOMEN:abdomen soft, non-tender and normal bowel sounds Musculoskeletal:no cyanosis of digits and no clubbing  NEURO: alert & oriented x 3 with fluent speech, no focal motor/sensory deficits  LABORATORY DATA:  I have reviewed the data as listed    Component Value Date/Time   NA 140 11/21/2019 1021   NA 142 11/28/2016 0916   K 4.2 11/21/2019 1021   K 3.9 11/28/2016 0916   CL 107 11/21/2019 1021   CO2 25 11/21/2019 1021   CO2 28 11/28/2016 0916   GLUCOSE 90 11/21/2019 1021   GLUCOSE 78 11/28/2016 0916   BUN 23 11/21/2019 1021   BUN 16.8 11/28/2016 0916   CREATININE 0.76 11/21/2019 1021   CREATININE 1.15 (H) 05/28/2019 1345   CREATININE 0.8 11/28/2016 0916   CALCIUM 8.4 (L) 11/21/2019 1021   CALCIUM 10.5 (H) 11/28/2016 0916   PROT 5.7 (L) 11/21/2019 1021   PROT 7.3 11/28/2016 0916   ALBUMIN 3.3 (L) 11/21/2019 1021   ALBUMIN 4.3 11/28/2016 0916   AST 25 11/21/2019 1021   AST 23 05/28/2019 1345   AST 19 11/28/2016 0916   ALT 23 11/21/2019 1021   ALT 14  05/28/2019 1345   ALT 19 11/28/2016 0916   ALKPHOS 47 11/21/2019 1021   ALKPHOS 50 11/28/2016 0916   BILITOT 0.5 11/21/2019 1021   BILITOT 0.3 05/28/2019 1345   BILITOT 0.48 11/28/2016 0916   GFRNONAA >60 11/21/2019 1021   GFRNONAA 46 (L) 05/28/2019 1345   GFRAA >60 11/21/2019 1021   GFRAA 53 (L) 05/28/2019 1345    No results found for: SPEP, UPEP  Lab Results  Component Value Date   WBC 3.1 (L) 11/21/2019   NEUTROABS 1.6 (L) 11/21/2019   HGB 10.9 (L) 11/21/2019   HCT 33.6 (L) 11/21/2019   MCV 93.1 11/21/2019   PLT 122 (L) 11/21/2019      Chemistry      Component Value Date/Time   NA 140 11/21/2019 1021   NA 142 11/28/2016 0916   K 4.2 11/21/2019 1021   K 3.9 11/28/2016 0916   CL 107 11/21/2019 1021   CO2 25 11/21/2019 1021   CO2 28 11/28/2016 0916   BUN 23 11/21/2019 1021   BUN 16.8 11/28/2016 0916   CREATININE 0.76 11/21/2019 1021   CREATININE 1.15 (H) 05/28/2019 1345   CREATININE 0.8 11/28/2016 0916      Component Value Date/Time   CALCIUM 8.4 (L) 11/21/2019 1021   CALCIUM 10.5 (H) 11/28/2016 0916   ALKPHOS 47 11/21/2019 1021   ALKPHOS 50 11/28/2016 0916   AST 25 11/21/2019 1021   AST 23 05/28/2019 1345   AST 19 11/28/2016 0916   ALT 23 11/21/2019 1021   ALT 14 05/28/2019 1345   ALT 19 11/28/2016 0916   BILITOT 0.5 11/21/2019 1021   BILITOT 0.3 05/28/2019 1345   BILITOT 0.48 11/28/2016 0916       All questions were answered. The patient knows to call the clinic with any problems, questions or concerns. No barriers to learning was detected.  I spent 15 minutes counseling the patient face to face. The total time spent in the appointment was 20 minutes and more than 50% was on counseling and review of test results  Heath Lark, MD 11/21/2019 5:19 PM

## 2019-11-21 NOTE — Assessment & Plan Note (Signed)
Her recent imaging studies show positive response to therapy Her tumor marker is not helpful so I will stopped CA-125 monitoring She will continue on weekly Taxol, given on days 1 and 8, rest day 15 for cycle of every 21 days We will repeat imaging study again due around end of February 2021

## 2019-11-21 NOTE — Assessment & Plan Note (Signed)
She denies worsening neuropathy while on treatment.  Observe closely for now

## 2019-11-21 NOTE — Patient Instructions (Signed)
[facility] Discharge Instructions for Patients Receiving Chemotherapy  Today you received the following chemotherapy agents:  Taxol.  To help prevent nausea and vomiting after your treatment, we encourage you to take your nausea medication as directed.   If you develop nausea and vomiting that is not controlled by your nausea medication, call the clinic.   BELOW ARE SYMPTOMS THAT SHOULD BE REPORTED IMMEDIATELY:  *FEVER GREATER THAN 100.5 F  *CHILLS WITH OR WITHOUT FEVER  NAUSEA AND VOMITING THAT IS NOT CONTROLLED WITH YOUR NAUSEA MEDICATION  *UNUSUAL SHORTNESS OF BREATH  *UNUSUAL BRUISING OR BLEEDING  TENDERNESS IN MOUTH AND THROAT WITH OR WITHOUT PRESENCE OF ULCERS  *URINARY PROBLEMS  *BOWEL PROBLEMS  UNUSUAL RASH Items with * indicate a potential emergency and should be followed up as soon as possible.  Feel free to call the clinic should you have any questions or concerns. The clinic phone number is [phone].  Please show the CHEMO ALERT CARD at check-in to the Emergency Department and triage nurse.   

## 2019-11-21 NOTE — Telephone Encounter (Signed)
Scheduled appt per 12/31 sch message  - pt to get an updated schedule before leaving chemo  RN aware

## 2019-11-21 NOTE — Assessment & Plan Note (Signed)
She is to have stable pancytopenia We will continue with recent dose changes She is not symptomatic

## 2019-11-21 NOTE — Assessment & Plan Note (Signed)
She has intermittent elevated blood pressure with occasional leg edema We discussed the use of diuretic therapy if needed She will call me if she wants to try diuresis to offload some of the extra fluid on board Alternatively, I can also switch her amlodipine in the future

## 2019-12-05 ENCOUNTER — Inpatient Hospital Stay: Payer: Medicare PPO | Attending: Gynecology

## 2019-12-05 ENCOUNTER — Other Ambulatory Visit: Payer: Self-pay

## 2019-12-05 ENCOUNTER — Inpatient Hospital Stay: Payer: Medicare PPO

## 2019-12-05 VITALS — BP 158/64 | HR 64 | Temp 97.8°F | Resp 17 | Wt 144.5 lb

## 2019-12-05 DIAGNOSIS — C561 Malignant neoplasm of right ovary: Secondary | ICD-10-CM

## 2019-12-05 DIAGNOSIS — Z7189 Other specified counseling: Secondary | ICD-10-CM

## 2019-12-05 DIAGNOSIS — C787 Secondary malignant neoplasm of liver and intrahepatic bile duct: Secondary | ICD-10-CM

## 2019-12-05 DIAGNOSIS — Z5111 Encounter for antineoplastic chemotherapy: Secondary | ICD-10-CM | POA: Diagnosis not present

## 2019-12-05 LAB — COMPREHENSIVE METABOLIC PANEL
ALT: 14 U/L (ref 0–44)
AST: 20 U/L (ref 15–41)
Albumin: 3.8 g/dL (ref 3.5–5.0)
Alkaline Phosphatase: 52 U/L (ref 38–126)
Anion gap: 10 (ref 5–15)
BUN: 31 mg/dL — ABNORMAL HIGH (ref 8–23)
CO2: 24 mmol/L (ref 22–32)
Calcium: 8.8 mg/dL — ABNORMAL LOW (ref 8.9–10.3)
Chloride: 109 mmol/L (ref 98–111)
Creatinine, Ser: 0.91 mg/dL (ref 0.44–1.00)
GFR calc Af Amer: >60 mL/min (ref >60)
GFR calc non Af Amer: >60 mL/min (ref >60)
Glucose, Bld: 112 mg/dL — ABNORMAL HIGH (ref 70–99)
Potassium: 4 mmol/L (ref 3.5–5.1)
Sodium: 143 mmol/L (ref 135–145)
Total Bilirubin: 0.3 mg/dL (ref 0.3–1.2)
Total Protein: 6.8 g/dL (ref 6.5–8.1)

## 2019-12-05 LAB — CBC WITH DIFFERENTIAL/PLATELET
Abs Immature Granulocytes: 0 10*3/uL (ref 0.00–0.07)
Basophils Absolute: 0.1 10*3/uL (ref 0.0–0.1)
Basophils Relative: 3 %
Eosinophils Absolute: 0.1 10*3/uL (ref 0.0–0.5)
Eosinophils Relative: 3 %
HCT: 36.7 % (ref 36.0–46.0)
Hemoglobin: 12.1 g/dL (ref 12.0–15.0)
Immature Granulocytes: 0 %
Lymphocytes Relative: 34 %
Lymphs Abs: 1.1 10*3/uL (ref 0.7–4.0)
MCH: 30.7 pg (ref 26.0–34.0)
MCHC: 33 g/dL (ref 30.0–36.0)
MCV: 93.1 fL (ref 80.0–100.0)
Monocytes Absolute: 0.6 10*3/uL (ref 0.1–1.0)
Monocytes Relative: 17 %
Neutro Abs: 1.4 10*3/uL — ABNORMAL LOW (ref 1.7–7.7)
Neutrophils Relative %: 43 %
Platelets: 146 10*3/uL — ABNORMAL LOW (ref 150–400)
RBC: 3.94 MIL/uL (ref 3.87–5.11)
RDW: 15.6 % — ABNORMAL HIGH (ref 11.5–15.5)
WBC: 3.3 10*3/uL — ABNORMAL LOW (ref 4.0–10.5)
nRBC: 0 % (ref 0.0–0.2)

## 2019-12-05 MED ORDER — DEXAMETHASONE SODIUM PHOSPHATE 10 MG/ML IJ SOLN
10.0000 mg | Freq: Once | INTRAMUSCULAR | Status: AC
  Administered 2019-12-05: 10 mg via INTRAVENOUS

## 2019-12-05 MED ORDER — HEPARIN SOD (PORK) LOCK FLUSH 100 UNIT/ML IV SOLN
500.0000 [IU] | Freq: Once | INTRAVENOUS | Status: AC | PRN
  Administered 2019-12-05: 500 [IU]
  Filled 2019-12-05: qty 5

## 2019-12-05 MED ORDER — SODIUM CHLORIDE 0.9 % IV SOLN
Freq: Once | INTRAVENOUS | Status: AC
  Filled 2019-12-05: qty 250

## 2019-12-05 MED ORDER — FAMOTIDINE IN NACL 20-0.9 MG/50ML-% IV SOLN
20.0000 mg | Freq: Once | INTRAVENOUS | Status: AC
  Administered 2019-12-05: 20 mg via INTRAVENOUS

## 2019-12-05 MED ORDER — DIPHENHYDRAMINE HCL 50 MG/ML IJ SOLN
25.0000 mg | Freq: Once | INTRAMUSCULAR | Status: AC
  Administered 2019-12-05: 25 mg via INTRAVENOUS

## 2019-12-05 MED ORDER — FAMOTIDINE IN NACL 20-0.9 MG/50ML-% IV SOLN
INTRAVENOUS | Status: AC
  Filled 2019-12-05: qty 50

## 2019-12-05 MED ORDER — SODIUM CHLORIDE 0.9% FLUSH
10.0000 mL | INTRAVENOUS | Status: DC | PRN
  Administered 2019-12-05: 10 mL
  Filled 2019-12-05: qty 10

## 2019-12-05 MED ORDER — DEXAMETHASONE SODIUM PHOSPHATE 10 MG/ML IJ SOLN
INTRAMUSCULAR | Status: AC
  Filled 2019-12-05: qty 1

## 2019-12-05 MED ORDER — DIPHENHYDRAMINE HCL 50 MG/ML IJ SOLN
INTRAMUSCULAR | Status: AC
  Filled 2019-12-05: qty 1

## 2019-12-05 MED ORDER — SODIUM CHLORIDE 0.9% FLUSH
10.0000 mL | Freq: Once | INTRAVENOUS | Status: AC
  Administered 2019-12-05: 10 mL
  Filled 2019-12-05: qty 10

## 2019-12-05 MED ORDER — SODIUM CHLORIDE 0.9 % IV SOLN
72.0000 mg/m2 | Freq: Once | INTRAVENOUS | Status: AC
  Administered 2019-12-05: 120 mg via INTRAVENOUS
  Filled 2019-12-05: qty 20

## 2019-12-05 NOTE — Patient Instructions (Signed)

## 2019-12-12 ENCOUNTER — Encounter: Payer: Self-pay | Admitting: Hematology and Oncology

## 2019-12-13 ENCOUNTER — Inpatient Hospital Stay: Payer: Medicare PPO

## 2019-12-13 ENCOUNTER — Other Ambulatory Visit: Payer: Self-pay

## 2019-12-13 VITALS — BP 152/62 | HR 63 | Temp 97.8°F | Resp 17 | Wt 144.4 lb

## 2019-12-13 DIAGNOSIS — Z7189 Other specified counseling: Secondary | ICD-10-CM

## 2019-12-13 DIAGNOSIS — C561 Malignant neoplasm of right ovary: Secondary | ICD-10-CM

## 2019-12-13 DIAGNOSIS — C787 Secondary malignant neoplasm of liver and intrahepatic bile duct: Secondary | ICD-10-CM

## 2019-12-13 DIAGNOSIS — Z5111 Encounter for antineoplastic chemotherapy: Secondary | ICD-10-CM | POA: Diagnosis not present

## 2019-12-13 LAB — COMPREHENSIVE METABOLIC PANEL
ALT: 19 U/L (ref 0–44)
AST: 24 U/L (ref 15–41)
Albumin: 3.7 g/dL (ref 3.5–5.0)
Alkaline Phosphatase: 49 U/L (ref 38–126)
Anion gap: 9 (ref 5–15)
BUN: 24 mg/dL — ABNORMAL HIGH (ref 8–23)
CO2: 22 mmol/L (ref 22–32)
Calcium: 8.7 mg/dL — ABNORMAL LOW (ref 8.9–10.3)
Chloride: 110 mmol/L (ref 98–111)
Creatinine, Ser: 0.84 mg/dL (ref 0.44–1.00)
GFR calc Af Amer: >60 mL/min (ref >60)
GFR calc non Af Amer: >60 mL/min (ref >60)
Glucose, Bld: 103 mg/dL — ABNORMAL HIGH (ref 70–99)
Potassium: 4.1 mmol/L (ref 3.5–5.1)
Sodium: 141 mmol/L (ref 135–145)
Total Bilirubin: 0.3 mg/dL (ref 0.3–1.2)
Total Protein: 6.3 g/dL — ABNORMAL LOW (ref 6.5–8.1)

## 2019-12-13 LAB — CBC WITH DIFFERENTIAL/PLATELET
Abs Immature Granulocytes: 0.02 10*3/uL (ref 0.00–0.07)
Basophils Absolute: 0.1 10*3/uL (ref 0.0–0.1)
Basophils Relative: 2 %
Eosinophils Absolute: 0.1 10*3/uL (ref 0.0–0.5)
Eosinophils Relative: 3 %
HCT: 35.4 % — ABNORMAL LOW (ref 36.0–46.0)
Hemoglobin: 11.6 g/dL — ABNORMAL LOW (ref 12.0–15.0)
Immature Granulocytes: 1 %
Lymphocytes Relative: 31 %
Lymphs Abs: 1.1 10*3/uL (ref 0.7–4.0)
MCH: 30.4 pg (ref 26.0–34.0)
MCHC: 32.8 g/dL (ref 30.0–36.0)
MCV: 92.9 fL (ref 80.0–100.0)
Monocytes Absolute: 0.4 10*3/uL (ref 0.1–1.0)
Monocytes Relative: 10 %
Neutro Abs: 1.8 10*3/uL (ref 1.7–7.7)
Neutrophils Relative %: 53 %
Platelets: 141 10*3/uL — ABNORMAL LOW (ref 150–400)
RBC: 3.81 MIL/uL — ABNORMAL LOW (ref 3.87–5.11)
RDW: 15.8 % — ABNORMAL HIGH (ref 11.5–15.5)
WBC: 3.4 10*3/uL — ABNORMAL LOW (ref 4.0–10.5)
nRBC: 0 % (ref 0.0–0.2)

## 2019-12-13 MED ORDER — DIPHENHYDRAMINE HCL 50 MG/ML IJ SOLN
25.0000 mg | Freq: Once | INTRAMUSCULAR | Status: AC
  Administered 2019-12-13: 25 mg via INTRAVENOUS

## 2019-12-13 MED ORDER — SODIUM CHLORIDE 0.9% FLUSH
10.0000 mL | INTRAVENOUS | Status: DC | PRN
  Administered 2019-12-13: 10 mL
  Filled 2019-12-13: qty 10

## 2019-12-13 MED ORDER — DIPHENHYDRAMINE HCL 50 MG/ML IJ SOLN
INTRAMUSCULAR | Status: AC
  Filled 2019-12-13: qty 1

## 2019-12-13 MED ORDER — DEXAMETHASONE SODIUM PHOSPHATE 10 MG/ML IJ SOLN
10.0000 mg | Freq: Once | INTRAMUSCULAR | Status: AC
  Administered 2019-12-13: 10 mg via INTRAVENOUS

## 2019-12-13 MED ORDER — FAMOTIDINE IN NACL 20-0.9 MG/50ML-% IV SOLN
20.0000 mg | Freq: Once | INTRAVENOUS | Status: AC
  Administered 2019-12-13: 20 mg via INTRAVENOUS

## 2019-12-13 MED ORDER — SODIUM CHLORIDE 0.9 % IV SOLN
Freq: Once | INTRAVENOUS | Status: AC
  Filled 2019-12-13: qty 250

## 2019-12-13 MED ORDER — HEPARIN SOD (PORK) LOCK FLUSH 100 UNIT/ML IV SOLN
500.0000 [IU] | Freq: Once | INTRAVENOUS | Status: AC | PRN
  Administered 2019-12-13: 500 [IU]
  Filled 2019-12-13: qty 5

## 2019-12-13 MED ORDER — DEXAMETHASONE SODIUM PHOSPHATE 10 MG/ML IJ SOLN
INTRAMUSCULAR | Status: AC
  Filled 2019-12-13: qty 1

## 2019-12-13 MED ORDER — FAMOTIDINE IN NACL 20-0.9 MG/50ML-% IV SOLN
INTRAVENOUS | Status: AC
  Filled 2019-12-13: qty 50

## 2019-12-13 MED ORDER — SODIUM CHLORIDE 0.9 % IV SOLN
72.0000 mg/m2 | Freq: Once | INTRAVENOUS | Status: AC
  Administered 2019-12-13: 120 mg via INTRAVENOUS
  Filled 2019-12-13: qty 20

## 2019-12-13 NOTE — Patient Instructions (Signed)
[facility] Discharge Instructions for Patients Receiving Chemotherapy  Today you received the following chemotherapy agents:  Taxol.  To help prevent nausea and vomiting after your treatment, we encourage you to take your nausea medication as directed.   If you develop nausea and vomiting that is not controlled by your nausea medication, call the clinic.   BELOW ARE SYMPTOMS THAT SHOULD BE REPORTED IMMEDIATELY:  *FEVER GREATER THAN 100.5 F  *CHILLS WITH OR WITHOUT FEVER  NAUSEA AND VOMITING THAT IS NOT CONTROLLED WITH YOUR NAUSEA MEDICATION  *UNUSUAL SHORTNESS OF BREATH  *UNUSUAL BRUISING OR BLEEDING  TENDERNESS IN MOUTH AND THROAT WITH OR WITHOUT PRESENCE OF ULCERS  *URINARY PROBLEMS  *BOWEL PROBLEMS  UNUSUAL RASH Items with * indicate a potential emergency and should be followed up as soon as possible.  Feel free to call the clinic should you have any questions or concerns. The clinic phone number is [phone].  Please show the CHEMO ALERT CARD at check-in to the Emergency Department and triage nurse.   

## 2019-12-26 ENCOUNTER — Encounter: Payer: Self-pay | Admitting: Hematology and Oncology

## 2019-12-27 ENCOUNTER — Inpatient Hospital Stay: Payer: Medicare PPO

## 2019-12-27 ENCOUNTER — Inpatient Hospital Stay: Payer: Medicare PPO | Admitting: Hematology and Oncology

## 2019-12-27 ENCOUNTER — Other Ambulatory Visit: Payer: Self-pay

## 2019-12-27 ENCOUNTER — Encounter: Payer: Self-pay | Admitting: Hematology and Oncology

## 2019-12-27 ENCOUNTER — Inpatient Hospital Stay: Payer: Medicare PPO | Attending: Gynecology

## 2019-12-27 VITALS — BP 154/68 | HR 61 | Temp 97.8°F | Resp 18 | Ht 62.0 in | Wt 143.6 lb

## 2019-12-27 DIAGNOSIS — D61818 Other pancytopenia: Secondary | ICD-10-CM | POA: Diagnosis not present

## 2019-12-27 DIAGNOSIS — Z7189 Other specified counseling: Secondary | ICD-10-CM

## 2019-12-27 DIAGNOSIS — C787 Secondary malignant neoplasm of liver and intrahepatic bile duct: Secondary | ICD-10-CM | POA: Insufficient documentation

## 2019-12-27 DIAGNOSIS — G620 Drug-induced polyneuropathy: Secondary | ICD-10-CM | POA: Diagnosis not present

## 2019-12-27 DIAGNOSIS — I10 Essential (primary) hypertension: Secondary | ICD-10-CM

## 2019-12-27 DIAGNOSIS — Z5111 Encounter for antineoplastic chemotherapy: Secondary | ICD-10-CM | POA: Insufficient documentation

## 2019-12-27 DIAGNOSIS — C561 Malignant neoplasm of right ovary: Secondary | ICD-10-CM

## 2019-12-27 DIAGNOSIS — T451X5A Adverse effect of antineoplastic and immunosuppressive drugs, initial encounter: Secondary | ICD-10-CM

## 2019-12-27 LAB — CBC WITH DIFFERENTIAL/PLATELET
Abs Immature Granulocytes: 0.01 10*3/uL (ref 0.00–0.07)
Basophils Absolute: 0.1 10*3/uL (ref 0.0–0.1)
Basophils Relative: 2 %
Eosinophils Absolute: 0.1 10*3/uL (ref 0.0–0.5)
Eosinophils Relative: 4 %
HCT: 36 % (ref 36.0–46.0)
Hemoglobin: 11.6 g/dL — ABNORMAL LOW (ref 12.0–15.0)
Immature Granulocytes: 0 %
Lymphocytes Relative: 32 %
Lymphs Abs: 1 10*3/uL (ref 0.7–4.0)
MCH: 29.8 pg (ref 26.0–34.0)
MCHC: 32.2 g/dL (ref 30.0–36.0)
MCV: 92.5 fL (ref 80.0–100.0)
Monocytes Absolute: 0.5 10*3/uL (ref 0.1–1.0)
Monocytes Relative: 15 %
Neutro Abs: 1.5 10*3/uL — ABNORMAL LOW (ref 1.7–7.7)
Neutrophils Relative %: 47 %
Platelets: 128 10*3/uL — ABNORMAL LOW (ref 150–400)
RBC: 3.89 MIL/uL (ref 3.87–5.11)
RDW: 16 % — ABNORMAL HIGH (ref 11.5–15.5)
WBC: 3.2 10*3/uL — ABNORMAL LOW (ref 4.0–10.5)
nRBC: 0 % (ref 0.0–0.2)

## 2019-12-27 LAB — COMPREHENSIVE METABOLIC PANEL
ALT: 17 U/L (ref 0–44)
AST: 20 U/L (ref 15–41)
Albumin: 3.6 g/dL (ref 3.5–5.0)
Alkaline Phosphatase: 47 U/L (ref 38–126)
Anion gap: 8 (ref 5–15)
BUN: 23 mg/dL (ref 8–23)
CO2: 25 mmol/L (ref 22–32)
Calcium: 8.7 mg/dL — ABNORMAL LOW (ref 8.9–10.3)
Chloride: 109 mmol/L (ref 98–111)
Creatinine, Ser: 0.82 mg/dL (ref 0.44–1.00)
GFR calc Af Amer: >60 mL/min (ref >60)
GFR calc non Af Amer: >60 mL/min (ref >60)
Glucose, Bld: 93 mg/dL (ref 70–99)
Potassium: 4 mmol/L (ref 3.5–5.1)
Sodium: 142 mmol/L (ref 135–145)
Total Bilirubin: 0.5 mg/dL (ref 0.3–1.2)
Total Protein: 6.1 g/dL — ABNORMAL LOW (ref 6.5–8.1)

## 2019-12-27 MED ORDER — FAMOTIDINE IN NACL 20-0.9 MG/50ML-% IV SOLN
INTRAVENOUS | Status: AC
  Filled 2019-12-27: qty 50

## 2019-12-27 MED ORDER — SODIUM CHLORIDE 0.9 % IV SOLN
72.0000 mg/m2 | Freq: Once | INTRAVENOUS | Status: AC
  Administered 2019-12-27: 120 mg via INTRAVENOUS
  Filled 2019-12-27: qty 20

## 2019-12-27 MED ORDER — DIPHENHYDRAMINE HCL 50 MG/ML IJ SOLN
INTRAMUSCULAR | Status: AC
  Filled 2019-12-27: qty 1

## 2019-12-27 MED ORDER — DEXAMETHASONE SODIUM PHOSPHATE 10 MG/ML IJ SOLN
10.0000 mg | Freq: Once | INTRAMUSCULAR | Status: AC
  Administered 2019-12-27: 10 mg via INTRAVENOUS

## 2019-12-27 MED ORDER — HEPARIN SOD (PORK) LOCK FLUSH 100 UNIT/ML IV SOLN
500.0000 [IU] | Freq: Once | INTRAVENOUS | Status: AC | PRN
  Administered 2019-12-27: 500 [IU]
  Filled 2019-12-27: qty 5

## 2019-12-27 MED ORDER — SODIUM CHLORIDE 0.9 % IV SOLN
Freq: Once | INTRAVENOUS | Status: AC
  Filled 2019-12-27: qty 250

## 2019-12-27 MED ORDER — DIPHENHYDRAMINE HCL 50 MG/ML IJ SOLN
25.0000 mg | Freq: Once | INTRAMUSCULAR | Status: AC
  Administered 2019-12-27: 12:00:00 25 mg via INTRAVENOUS

## 2019-12-27 MED ORDER — FAMOTIDINE IN NACL 20-0.9 MG/50ML-% IV SOLN
20.0000 mg | Freq: Once | INTRAVENOUS | Status: AC
  Administered 2019-12-27: 20 mg via INTRAVENOUS

## 2019-12-27 MED ORDER — SODIUM CHLORIDE 0.9% FLUSH
10.0000 mL | Freq: Once | INTRAVENOUS | Status: AC
  Administered 2019-12-27: 10 mL
  Filled 2019-12-27: qty 10

## 2019-12-27 MED ORDER — DEXAMETHASONE SODIUM PHOSPHATE 10 MG/ML IJ SOLN
INTRAMUSCULAR | Status: AC
  Filled 2019-12-27: qty 1

## 2019-12-27 MED ORDER — SODIUM CHLORIDE 0.9% FLUSH
10.0000 mL | INTRAVENOUS | Status: DC | PRN
  Administered 2019-12-27: 10 mL
  Filled 2019-12-27: qty 10

## 2019-12-27 NOTE — Progress Notes (Signed)
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 CT ABDOMEN PELVIS W CONTRAST    Standing Status:   Future    Standing Expiration Date:   12/26/2020    Order Specific Question:   If indicated for the ordered procedure, I authorize the administration of contrast media per Radiology protocol    Answer:   Yes    Order Specific Question:   Preferred imaging location?    Answer:   [facility]    Order Specific Question:   Radiology Contrast Protocol - do NOT remove file path    Answer:   [email or web address]    All questions were answered. The patient knows to call the clinic with any problems, questions  or concerns. The total time spent in the appointment was 25 minutes encounter with patients including review of chart and various tests results, discussions about plan of care and coordination of care plan   Heath Lark, MD 12/27/2019 2:18 PM  INTERVAL HISTORY: Please see below for problem oriented charting. She returns for chemotherapy and follow-up She denies recent infection, fever or chills No worsening peripheral neuropathy No nausea vomiting Her blood pressure control at home is satisfactory She had questions related to Covid vaccine but currently, she would like to hold off until she has results from next CT scan  SUMMARY OF ONCOLOGIC HISTORY: Oncology History Overview Note  Neg genetics. Additional tests revealed ER: 80%, PR 3% MSI stable   Right ovarian epithelial cancer ([place])  07/01/2015 Initial Diagnosis   She presented with postmenopausal bleeding   07/02/2015 Imaging   US pelvis showed bilateral ovarian cyst   07/13/2015 Imaging   5.2 cm left adnexal cystic lesion, with indeterminate but probably benign characteristics. In a postmenopausal female, consider continued annual imaging followup with CT or MRI versus surgical evaluation.  Colonic diverticulosis. No radiographic evidence of diverticulitis.  Cholelithiasis.  No radiographic evidence of cholecystitis.  Small hiatal hernia.   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  The tumor involves serosal surface of the right ovary and adjacent right fallopian tube.  Cervix and endometrium were within normal limits. ER positive Peritoneal washing was positive.   07/30/2015 Surgery   SHe underwent laparoscopic-assisted  total vaginal hysterectomy, bilateral salpingo-oophorectomy   07/30/2015 Pathology Results   PERITONEAL WASHING PELVIC (SPECIMEN 1 OF 1, COLLECTED ON 07/30/2015): MALIGNANT CELLS CONSISTENT WITH HIGH GRADE CARCINOMA    08/13/2015 Tumor Marker   Patient's tumor was tested for the following markers: CA-125 Results of the tumor marker test revealed 96   08/25/2015 - 12/08/2015 Chemotherapy   The patient had 6 cycles of carboplatin and taxol   09/07/2015 Tumor Marker   Patient's tumor was tested for the following markers: CA-125 Results of the tumor marker test revealed 51   10/05/2015 Tumor Marker   Patient's tumor was tested for the following markers: CA-125 Results of the tumor marker test revealed 29   11/09/2015 Tumor Marker   Patient's tumor was tested for the following markers: CA-125 Results of the tumor marker test revealed 44   12/08/2015 Tumor Marker   Patient's tumor was tested for the following markers: CA-125 Results of the tumor marker test revealed 30   12/15/2015 Genetic Testing   Genetics testing normal by [facility] Breast Ovarian panel 12-15-15   01/11/2016 Imaging   1. The only finding of note are several adjacent peripheral abnormal hypodensities along the anterior superior spleen margin, differential diagnostic considerations including small peripheral interval splenic infarcts, splenic injury with subcapsular a fluid collections, or less likely metastatic disease to the splenic margin. This likely merits observation. 2. Other imaging findings of potential clinical significance: Small type 1 hiatal hernia. Aortoiliac atherosclerotic vascular disease. Lower lumbar spondylosis and degenerative disc disease. Gallstones with potential mild gallbladder wall thickening.   03/07/2016 Tumor Marker   Patient's tumor was tested for the following markers: CA-125 Results of the tumor marker test revealed 24.2   04/06/2016 Imaging   1. No evidence of a ovarian cancer metastasis. 2. No ascites. 3. Post hysterectomy and oophorectomy. 4. Cholelithiasis with several large gallstones   04/06/2016 Tumor Marker   Patient's tumor was tested for the following markers: CA-125 Results of the tumor marker  test revealed 22.8   05/30/2016 Tumor Marker   Patient's tumor was tested for the following markers: CA-125 Results of the tumor marker test revealed 21   09/07/2016 Tumor Marker   Patient's tumor was tested for the following markers: CA-125 Results of the tumor marker test revealed 22.4   11/28/2016 Tumor Marker   Patient's tumor was tested for the following markers: CA-125 Results of the tumor marker test revealed 18.8   02/23/2017 Tumor Marker   Patient's tumor was tested for the following markers: CA-125 Results of the tumor marker test revealed 19.1   06/02/2017 Tumor Marker   Patient's tumor was tested for the following markers: CA-125 Results of the tumor marker test revealed 18.3   08/24/2017 Mammogram   Pt reports mammogram complete   08/28/2017 Tumor Marker   Patient's tumor was tested for the following markers: CA-125 Results of the tumor marker test revealed 21.1   12/01/2017 Tumor Marker   Patient's tumor was tested for the following markers: CA-125 Results of the tumor marker test revealed 31.2   12/13/2017 Imaging   New 2.7 cm peritoneal soft tissue mass in anterior left lower quadrant, consistent with metastatic disease.  No other sites of metastatic disease identified.  Incidental findings including: Cholelithiasis. Colonic diverticulosis. Tiny hiatal hernia. Aortic atherosclerosis.   01/09/2018 - 05/07/2018 Chemotherapy   The patient had carboplatin and taxol; After 2nd cycle, she developed carboplatin allergy. She received carboplatin desensitization protocol at [facility] for final 4 cycles, completed by 6/17. Avastin  was added from 04/16/18 onwards   01/30/2018 Adverse Reaction   Potential carboplatin allergy is suspected. She received half the dose of prescribed carboplatin on cycle 2   05/31/2018 Tumor Marker   Patient's tumor was tested for the following markers: CA-125 Results of the tumor marker test revealed 26   05/31/2018 Imaging   1. Peritoneal soft tissue  nodule identified on the previous study has decreased in the interval the. No progressive findings in the abdomen or pelvis on today's study to suggest disease progression. 2. Tiny pulmonary nodules, likely benign. Attention on follow-up recommended. 3. Cholelithiasis. 4.  Aortic Atherosclerois (ICD10-170.0) 5. Tiny hiatal hernia.     06/04/2018 -  Chemotherapy   The patient is placed on Avastin for maintenance   06/25/2018 Tumor Marker   Patient's tumor was tested for the following markers: CA-125 Results of the tumor marker test revealed 22.5   08/06/2018 Tumor Marker   Patient's tumor was tested for the following markers: CA-125 Results of the tumor marker test revealed 20.7   09/14/2018 Imaging   Status post hysterectomy and bilateral salpingo-oophorectomy.  Stable soft tissue nodule beneath the left lower anterior abdominal wall, likely reflecting stable peritoneal disease.  Scattered small subpleural nodules in the lungs bilaterally, measuring up to 3 mm, technically indeterminate although likely benign. Please note that Fleischner Society guidelines do not apply. Attention on follow-up is suggested.  No evidence of new/progressive metastatic disease.   09/14/2018 Tumor Marker   Patient's tumor was tested for the following markers: CA-125 Results of the tumor marker test revealed 21.3   10/30/2018 Tumor Marker   Patient's tumor was tested for the following markers: CA-125 Results of the tumor marker test revealed 20.8   12/12/2018 Tumor Marker   Patient's tumor was tested for the following markers: CA-125 Results of the tumor marker test revealed 19.7   01/01/2019 Tumor Marker   Patient's tumor was tested for the following markers: CA-125 Results of the tumor marker test revealed 21.6   01/22/2019 Tumor Marker   Patient's tumor was tested for the following markers: CA-125 Results of the tumor marker test revealed 21.3   02/12/2019 Tumor Marker   Patient's tumor was  tested for the following markers: CA-125 Results of the tumor marker test revealed 21.6   04/16/2019 Tumor Marker   Patient's tumor was tested for the following markers: CA-125 Results of the tumor marker test revealed 21.7   05/07/2019 Tumor Marker   Patient's tumor was tested for the following markers: CA-125 Results of the tumor marker test revealed 27.3   05/28/2019 Tumor Marker   Patient's tumor was tested for the following markers: CA-125 Results of the tumor marker test revealed 24.5   07/09/2019 - 08/19/2019 Chemotherapy   The patient had bevacizumab for chemotherapy treatment.     07/09/2019 Tumor Marker   Patient's tumor was tested for the following markers: CA-125 Results of the tumor marker test revealed 23.3.   08/06/2019 Imaging   Ct abdomen and pelvis 1. New hypodense lesions of the left lobe of the liver measuring 2.5 x 2.5 cm (series 2, image 16) and right lobe of the liver measuring 1.1 x 1.1 cm (series 2, image 23), concerning for metastatic disease.   2. Interval enlargement of an irregular nodule of the omentum measuring 1.0 x 1.0 cm, previously 1.0 x 0.5 cm (series 2, image 51), concerning for peritoneal disease.   3.  Status post hysterectomy.   4. Other chronic and incidental findings as detailed  above. Aortic Atherosclerosis (ICD10-I70.0).   08/16/2019 -  Chemotherapy   The patient had weekly Taxol for chemotherapy treatment.     08/23/2019 Tumor Marker   Patient's tumor was tested for the following markers: CA-125 Results of the tumor marker test revealed 33.7.   09/06/2019 Tumor Marker   Patient's tumor was tested for the following markers: CA-125 Results of the tumor marker test revealed 32.2   10/14/2019 Imaging   1. Interval decrease in hypodense masses of the left lobe and right lobe of the liver, mass in the left lobe measuring 1.7 x 1.6 cm, previously 2.5 x 2.5 cm (series 2, image 14) and in the right lobe of the liver measuring 0.8 x 0.7 cm,  previously 1.1 x 1.1 cm (series 2, image 22).   2. Interval decrease in size of an omental nodule, measuring 0.8 x 0.7 cm, previously 1.0 x 1.0 cm (series 2, image 55).   3. Findings are consistent with improved metastatic disease. No new evidence of metastatic disease in the abdomen or pelvis.   4.  Status post hysterectomy.   5.  Cholelithiasis.   6.  Aortic Atherosclerosis (ICD10-I70.0).     REVIEW OF SYSTEMS:   Constitutional: Denies fevers, chills or abnormal weight loss Eyes: Denies blurriness of vision Ears, nose, mouth, throat, and face: Denies mucositis or sore throat Respiratory: Denies cough, dyspnea or wheezes Cardiovascular: Denies palpitation, chest discomfort or lower extremity swelling Skin: Denies abnormal skin rashes Lymphatics: Denies new lymphadenopathy or easy bruising Neurological:Denies numbness, tingling or new weaknesses Behavioral/Psych: Mood is stable, no new changes  All other systems were reviewed with the patient and are negative.  I have reviewed the past medical history, past surgical history, social history and family history with the patient and they are unchanged from previous note.  ALLERGIES:  is allergic to carboplatin.  MEDICATIONS:  Current Outpatient Medications  Medication Sig Dispense Refill  . amLODipine (NORVASC) 10 MG tablet Take 1 tablet (10 mg total) by mouth daily. 90 tablet 3  . atenolol (TENORMIN) 25 MG tablet Take 1 tablet (25 mg total) by mouth daily. 30 tablet 9  . atorvastatin (LIPITOR) 10 MG tablet Take 1 tablet (10 mg total) by mouth daily. 30 tablet 2  . Coenzyme Q10 (CO Q 10) 100 MG CAPS Take 1 capsule by mouth daily.     . Glucosamine-Chondroit-Vit C-Mn (GLUCOSAMINE 1500 COMPLEX PO) Take 1 capsule by mouth 2 (two) times daily.    [name] lidocaine-prilocaine (EMLA) cream Apply to affected area once (Patient not taking: Reported on 11/14/2019) 30 g 3  . loratadine (CLARITIN) 10 MG tablet Take 10 mg by mouth daily as needed  for allergies (hives).    . Multiple Vitamin (MULTIVITAMIN) capsule Take 1 capsule by mouth daily.     . Omega-3 Fatty Acids (OMEGA-3 FISH OIL) 300 MG CAPS Take 1 capsule by mouth daily.     . ondansetron (ZOFRAN) 8 MG tablet Take 1 tablet (8 mg total) by mouth every 8 (eight) hours as needed for refractory nausea / vomiting. 30 tablet 1  . Polyethyl Glycol-Propyl Glycol (SYSTANE ULTRA OP) Apply 1 drop to eye 2 (two) times daily at 10 AM and 5 PM.    . Probiotic Product (PROBIOTIC ADVANCED PO) Take 1 capsule by mouth daily.    . prochlorperazine (COMPAZINE) 10 MG tablet Take 1 tablet (10 mg total) by mouth every 6 (six) hours as needed (Nausea or vomiting). 60 tablet 1  . vitamin E 400 UNIT  capsule Take 400 Units by mouth every evening.      No current facility-administered medications for this visit.   Facility-Administered Medications Ordered in Other Visits  Medication Dose Route Frequency Provider Last Rate Last Admin  . sodium chloride flush (NS) 0.9 % injection 10 mL  10 mL Intracatheter PRN Alvy Bimler, Aries Townley, MD   10 mL at 12/27/19 1412    PHYSICAL EXAMINATION: ECOG PERFORMANCE STATUS: 0 - Asymptomatic  Vitals:   12/27/19 1150  BP: (!) 154/68  Pulse: 61  Resp: 18  Temp: 97.8 F (36.6 C)  SpO2: 99%   Filed Weights   12/27/19 1150  Weight: 143 lb 9.6 oz (65.1 kg)    GENERAL:alert, no distress and comfortable SKIN: skin color, texture, turgor are normal, no rashes or significant lesions EYES: normal, Conjunctiva are pink and non-injected, sclera clear OROPHARYNX:no exudate, no erythema and lips, buccal mucosa, and tongue normal  NECK: supple, thyroid normal size, non-tender, without nodularity LYMPH:  no palpable lymphadenopathy in the cervical, axillary or inguinal LUNGS: clear to auscultation and percussion with normal breathing effort HEART: regular rate & rhythm and no murmurs and no lower extremity edema ABDOMEN:abdomen soft, non-tender and normal bowel  sounds Musculoskeletal:no cyanosis of digits and no clubbing  NEURO: alert & oriented x 3 with fluent speech, no focal motor/sensory deficits  LABORATORY DATA:  I have reviewed the data as listed    Component Value Date/Time   NA 142 12/27/2019 1049   NA 142 11/28/2016 0916   K 4.0 12/27/2019 1049   K 3.9 11/28/2016 0916   CL 109 12/27/2019 1049   CO2 25 12/27/2019 1049   CO2 28 11/28/2016 0916   GLUCOSE 93 12/27/2019 1049   GLUCOSE 78 11/28/2016 0916   BUN 23 12/27/2019 1049   BUN 16.8 11/28/2016 0916   CREATININE 0.82 12/27/2019 1049   CREATININE 1.15 (H) 05/28/2019 1345   CREATININE 0.8 11/28/2016 0916   CALCIUM 8.7 (L) 12/27/2019 1049   CALCIUM 10.5 (H) 11/28/2016 0916   PROT 6.1 (L) 12/27/2019 1049   PROT 7.3 11/28/2016 0916   ALBUMIN 3.6 12/27/2019 1049   ALBUMIN 4.3 11/28/2016 0916   AST 20 12/27/2019 1049   AST 23 05/28/2019 1345   AST 19 11/28/2016 0916   ALT 17 12/27/2019 1049   ALT 14 05/28/2019 1345   ALT 19 11/28/2016 0916   ALKPHOS 47 12/27/2019 1049   ALKPHOS 50 11/28/2016 0916   BILITOT 0.5 12/27/2019 1049   BILITOT 0.3 05/28/2019 1345   BILITOT 0.48 11/28/2016 0916   GFRNONAA >60 12/27/2019 1049   GFRNONAA 46 (L) 05/28/2019 1345   GFRAA >60 12/27/2019 1049   GFRAA 53 (L) 05/28/2019 1345    No results found for: SPEP, UPEP  Lab Results  Component Value Date   WBC 3.2 (L) 12/27/2019   NEUTROABS 1.5 (L) 12/27/2019   HGB 11.6 (L) 12/27/2019   HCT 36.0 12/27/2019   MCV 92.5 12/27/2019   PLT 128 (L) 12/27/2019      Chemistry      Component Value Date/Time   NA 142 12/27/2019 1049   NA 142 11/28/2016 0916   K 4.0 12/27/2019 1049   K 3.9 11/28/2016 0916   CL 109 12/27/2019 1049   CO2 25 12/27/2019 1049   CO2 28 11/28/2016 0916   BUN 23 12/27/2019 1049   BUN 16.8 11/28/2016 0916   CREATININE 0.82 12/27/2019 1049   CREATININE 1.15 (H) 05/28/2019 1345   CREATININE 0.8 11/28/2016 0916  Component Value Date/Time   CALCIUM 8.7 (L)  12/27/2019 1049   CALCIUM 10.5 (H) 11/28/2016 0916   ALKPHOS 47 12/27/2019 1049   ALKPHOS 50 11/28/2016 0916   AST 20 12/27/2019 1049   AST 23 05/28/2019 1345   AST 19 11/28/2016 0916   ALT 17 12/27/2019 1049   ALT 14 05/28/2019 1345   ALT 19 11/28/2016 0916   BILITOT 0.5 12/27/2019 1049   BILITOT 0.3 05/28/2019 1345   BILITOT 0.48 11/28/2016 0916

## 2019-12-27 NOTE — Assessment & Plan Note (Signed)
She is to have stable pancytopenia We will continue with recent dose changes She is not symptomatic We will proceed with treatment with similar dose adjustment We will only hold chemotherapy if ANC is less than 1 or platelet count less than 75,000

## 2019-12-27 NOTE — Assessment & Plan Note (Signed)
Her recent imaging studies showed positive response to therapy Her tumor marker is not helpful so I have stopped CA-125 monitoring She will continue on weekly Taxol, given on days 1 and 8, rest day 15 for cycle of every 21 days We will repeat imaging study again due around the first week of March

## 2019-12-27 NOTE — Patient Instructions (Signed)
[facility] Discharge Instructions for Patients Receiving Chemotherapy  Today you received the following chemotherapy agents:  Taxol.  To help prevent nausea and vomiting after your treatment, we encourage you to take your nausea medication as directed.   If you develop nausea and vomiting that is not controlled by your nausea medication, call the clinic.   BELOW ARE SYMPTOMS THAT SHOULD BE REPORTED IMMEDIATELY:  *FEVER GREATER THAN 100.5 F  *CHILLS WITH OR WITHOUT FEVER  NAUSEA AND VOMITING THAT IS NOT CONTROLLED WITH YOUR NAUSEA MEDICATION  *UNUSUAL SHORTNESS OF BREATH  *UNUSUAL BRUISING OR BLEEDING  TENDERNESS IN MOUTH AND THROAT WITH OR WITHOUT PRESENCE OF ULCERS  *URINARY PROBLEMS  *BOWEL PROBLEMS  UNUSUAL RASH Items with * indicate a potential emergency and should be followed up as soon as possible.  Feel free to call the clinic should you have any questions or concerns. The clinic phone number is [phone].  Please show the CHEMO ALERT CARD at check-in to the Emergency Department and triage nurse.   

## 2019-12-27 NOTE — Assessment & Plan Note (Signed)
She has intermittent elevated blood pressure but remained asymptomatic Her blood pressure monitoring at home looks better Observe closely for now She will continue taking her current blood pressure medication without dose adjustment

## 2019-12-27 NOTE — Patient Instructions (Signed)

## 2019-12-27 NOTE — Assessment & Plan Note (Signed)
The size of liver metastases improving Her liver enzymes are stable Continue chemotherapy as above

## 2019-12-27 NOTE — Assessment & Plan Note (Signed)
She denies worsening neuropathy while on treatment.  Observe closely for now

## 2020-01-03 ENCOUNTER — Other Ambulatory Visit: Payer: Self-pay

## 2020-01-03 ENCOUNTER — Inpatient Hospital Stay: Payer: Medicare PPO

## 2020-01-03 VITALS — BP 164/59 | HR 58 | Temp 98.1°F | Resp 18

## 2020-01-03 DIAGNOSIS — C561 Malignant neoplasm of right ovary: Secondary | ICD-10-CM

## 2020-01-03 DIAGNOSIS — Z5111 Encounter for antineoplastic chemotherapy: Secondary | ICD-10-CM | POA: Diagnosis not present

## 2020-01-03 DIAGNOSIS — Z7189 Other specified counseling: Secondary | ICD-10-CM

## 2020-01-03 DIAGNOSIS — C787 Secondary malignant neoplasm of liver and intrahepatic bile duct: Secondary | ICD-10-CM

## 2020-01-03 LAB — COMPREHENSIVE METABOLIC PANEL
ALT: 19 U/L (ref 0–44)
AST: 24 U/L (ref 15–41)
Albumin: 3.7 g/dL (ref 3.5–5.0)
Alkaline Phosphatase: 49 U/L (ref 38–126)
Anion gap: 9 (ref 5–15)
BUN: 20 mg/dL (ref 8–23)
CO2: 25 mmol/L (ref 22–32)
Calcium: 8.9 mg/dL (ref 8.9–10.3)
Chloride: 108 mmol/L (ref 98–111)
Creatinine, Ser: 0.77 mg/dL (ref 0.44–1.00)
GFR calc Af Amer: >60 mL/min (ref >60)
GFR calc non Af Amer: >60 mL/min (ref >60)
Glucose, Bld: 94 mg/dL (ref 70–99)
Potassium: 4.3 mmol/L (ref 3.5–5.1)
Sodium: 142 mmol/L (ref 135–145)
Total Bilirubin: 0.3 mg/dL (ref 0.3–1.2)
Total Protein: 6.1 g/dL — ABNORMAL LOW (ref 6.5–8.1)

## 2020-01-03 LAB — CBC WITH DIFFERENTIAL/PLATELET
Abs Immature Granulocytes: 0.01 10*3/uL (ref 0.00–0.07)
Basophils Absolute: 0.1 10*3/uL (ref 0.0–0.1)
Basophils Relative: 2 %
Eosinophils Absolute: 0.2 10*3/uL (ref 0.0–0.5)
Eosinophils Relative: 4 %
HCT: 35.5 % — ABNORMAL LOW (ref 36.0–46.0)
Hemoglobin: 11.6 g/dL — ABNORMAL LOW (ref 12.0–15.0)
Immature Granulocytes: 0 %
Lymphocytes Relative: 32 %
Lymphs Abs: 1.2 10*3/uL (ref 0.7–4.0)
MCH: 30.2 pg (ref 26.0–34.0)
MCHC: 32.7 g/dL (ref 30.0–36.0)
MCV: 92.4 fL (ref 80.0–100.0)
Monocytes Absolute: 0.4 10*3/uL (ref 0.1–1.0)
Monocytes Relative: 10 %
Neutro Abs: 1.9 10*3/uL (ref 1.7–7.7)
Neutrophils Relative %: 52 %
Platelets: 133 10*3/uL — ABNORMAL LOW (ref 150–400)
RBC: 3.84 MIL/uL — ABNORMAL LOW (ref 3.87–5.11)
RDW: 15.9 % — ABNORMAL HIGH (ref 11.5–15.5)
WBC: 3.7 10*3/uL — ABNORMAL LOW (ref 4.0–10.5)
nRBC: 0 % (ref 0.0–0.2)

## 2020-01-03 MED ORDER — FAMOTIDINE IN NACL 20-0.9 MG/50ML-% IV SOLN
20.0000 mg | Freq: Once | INTRAVENOUS | Status: AC
  Administered 2020-01-03: 15:00:00 20 mg via INTRAVENOUS

## 2020-01-03 MED ORDER — HEPARIN SOD (PORK) LOCK FLUSH 100 UNIT/ML IV SOLN
500.0000 [IU] | Freq: Once | INTRAVENOUS | Status: AC | PRN
  Administered 2020-01-03: 500 [IU]
  Filled 2020-01-03: qty 5

## 2020-01-03 MED ORDER — DEXAMETHASONE SODIUM PHOSPHATE 10 MG/ML IJ SOLN
10.0000 mg | Freq: Once | INTRAMUSCULAR | Status: AC
  Administered 2020-01-03: 14:00:00 10 mg via INTRAVENOUS

## 2020-01-03 MED ORDER — SODIUM CHLORIDE 0.9% FLUSH
10.0000 mL | INTRAVENOUS | Status: DC | PRN
  Administered 2020-01-03: 16:00:00 10 mL
  Filled 2020-01-03: qty 10

## 2020-01-03 MED ORDER — FAMOTIDINE IN NACL 20-0.9 MG/50ML-% IV SOLN
INTRAVENOUS | Status: AC
  Filled 2020-01-03: qty 50

## 2020-01-03 MED ORDER — SODIUM CHLORIDE 0.9 % IV SOLN
72.0000 mg/m2 | Freq: Once | INTRAVENOUS | Status: AC
  Administered 2020-01-03: 15:00:00 120 mg via INTRAVENOUS
  Filled 2020-01-03: qty 20

## 2020-01-03 MED ORDER — DEXAMETHASONE SODIUM PHOSPHATE 10 MG/ML IJ SOLN
INTRAMUSCULAR | Status: AC
  Filled 2020-01-03: qty 1

## 2020-01-03 MED ORDER — SODIUM CHLORIDE 0.9 % IV SOLN
Freq: Once | INTRAVENOUS | Status: AC
  Filled 2020-01-03: qty 250

## 2020-01-03 MED ORDER — DIPHENHYDRAMINE HCL 50 MG/ML IJ SOLN
INTRAMUSCULAR | Status: AC
  Filled 2020-01-03: qty 1

## 2020-01-03 MED ORDER — DIPHENHYDRAMINE HCL 50 MG/ML IJ SOLN
25.0000 mg | Freq: Once | INTRAMUSCULAR | Status: AC
  Administered 2020-01-03: 14:00:00 25 mg via INTRAVENOUS

## 2020-01-03 NOTE — Patient Instructions (Signed)
[facility] Discharge Instructions for Patients Receiving Chemotherapy  Today you received the following chemotherapy agents:  Taxol.  To help prevent nausea and vomiting after your treatment, we encourage you to take your nausea medication as directed.   If you develop nausea and vomiting that is not controlled by your nausea medication, call the clinic.   BELOW ARE SYMPTOMS THAT SHOULD BE REPORTED IMMEDIATELY:  *FEVER GREATER THAN 100.5 F  *CHILLS WITH OR WITHOUT FEVER  NAUSEA AND VOMITING THAT IS NOT CONTROLLED WITH YOUR NAUSEA MEDICATION  *UNUSUAL SHORTNESS OF BREATH  *UNUSUAL BRUISING OR BLEEDING  TENDERNESS IN MOUTH AND THROAT WITH OR WITHOUT PRESENCE OF ULCERS  *URINARY PROBLEMS  *BOWEL PROBLEMS  UNUSUAL RASH Items with * indicate a potential emergency and should be followed up as soon as possible.  Feel free to call the clinic should you have any questions or concerns. The clinic phone number is [phone].  Please show the CHEMO ALERT CARD at check-in to the Emergency Department and triage nurse.   

## 2020-01-17 ENCOUNTER — Inpatient Hospital Stay: Payer: Medicare PPO

## 2020-01-17 ENCOUNTER — Other Ambulatory Visit: Payer: Self-pay

## 2020-01-17 VITALS — BP 128/60 | HR 60 | Temp 98.5°F | Resp 16 | Wt 143.8 lb

## 2020-01-17 DIAGNOSIS — C561 Malignant neoplasm of right ovary: Secondary | ICD-10-CM

## 2020-01-17 DIAGNOSIS — C787 Secondary malignant neoplasm of liver and intrahepatic bile duct: Secondary | ICD-10-CM

## 2020-01-17 DIAGNOSIS — Z7189 Other specified counseling: Secondary | ICD-10-CM

## 2020-01-17 DIAGNOSIS — Z5111 Encounter for antineoplastic chemotherapy: Secondary | ICD-10-CM | POA: Diagnosis not present

## 2020-01-17 LAB — COMPREHENSIVE METABOLIC PANEL
ALT: 15 U/L (ref 0–44)
AST: 23 U/L (ref 15–41)
Albumin: 3.7 g/dL (ref 3.5–5.0)
Alkaline Phosphatase: 53 U/L (ref 38–126)
Anion gap: 9 (ref 5–15)
BUN: 27 mg/dL — ABNORMAL HIGH (ref 8–23)
CO2: 24 mmol/L (ref 22–32)
Calcium: 9 mg/dL (ref 8.9–10.3)
Chloride: 109 mmol/L (ref 98–111)
Creatinine, Ser: 0.79 mg/dL (ref 0.44–1.00)
GFR calc Af Amer: >60 mL/min (ref >60)
GFR calc non Af Amer: >60 mL/min (ref >60)
Glucose, Bld: 103 mg/dL — ABNORMAL HIGH (ref 70–99)
Potassium: 4.2 mmol/L (ref 3.5–5.1)
Sodium: 142 mmol/L (ref 135–145)
Total Bilirubin: 0.4 mg/dL (ref 0.3–1.2)
Total Protein: 6.6 g/dL (ref 6.5–8.1)

## 2020-01-17 LAB — CBC WITH DIFFERENTIAL/PLATELET
Abs Immature Granulocytes: 0.01 10*3/uL (ref 0.00–0.07)
Basophils Absolute: 0.1 10*3/uL (ref 0.0–0.1)
Basophils Relative: 2 %
Eosinophils Absolute: 0.1 10*3/uL (ref 0.0–0.5)
Eosinophils Relative: 3 %
HCT: 37.3 % (ref 36.0–46.0)
Hemoglobin: 12.1 g/dL (ref 12.0–15.0)
Immature Granulocytes: 0 %
Lymphocytes Relative: 32 %
Lymphs Abs: 1.2 10*3/uL (ref 0.7–4.0)
MCH: 30 pg (ref 26.0–34.0)
MCHC: 32.4 g/dL (ref 30.0–36.0)
MCV: 92.6 fL (ref 80.0–100.0)
Monocytes Absolute: 0.5 10*3/uL (ref 0.1–1.0)
Monocytes Relative: 13 %
Neutro Abs: 1.9 10*3/uL (ref 1.7–7.7)
Neutrophils Relative %: 50 %
Platelets: 145 10*3/uL — ABNORMAL LOW (ref 150–400)
RBC: 4.03 MIL/uL (ref 3.87–5.11)
RDW: 16 % — ABNORMAL HIGH (ref 11.5–15.5)
WBC: 3.7 10*3/uL — ABNORMAL LOW (ref 4.0–10.5)
nRBC: 0 % (ref 0.0–0.2)

## 2020-01-17 MED ORDER — HEPARIN SOD (PORK) LOCK FLUSH 100 UNIT/ML IV SOLN
500.0000 [IU] | Freq: Once | INTRAVENOUS | Status: AC | PRN
  Administered 2020-01-17: 500 [IU]
  Filled 2020-01-17: qty 5

## 2020-01-17 MED ORDER — FAMOTIDINE IN NACL 20-0.9 MG/50ML-% IV SOLN
20.0000 mg | Freq: Once | INTRAVENOUS | Status: AC
  Administered 2020-01-17: 20 mg via INTRAVENOUS

## 2020-01-17 MED ORDER — FAMOTIDINE IN NACL 20-0.9 MG/50ML-% IV SOLN
INTRAVENOUS | Status: AC
  Filled 2020-01-17: qty 50

## 2020-01-17 MED ORDER — DEXAMETHASONE SODIUM PHOSPHATE 10 MG/ML IJ SOLN
10.0000 mg | Freq: Once | INTRAMUSCULAR | Status: AC
  Administered 2020-01-17: 10 mg via INTRAVENOUS

## 2020-01-17 MED ORDER — SODIUM CHLORIDE 0.9% FLUSH
10.0000 mL | INTRAVENOUS | Status: DC | PRN
  Administered 2020-01-17: 10 mL
  Filled 2020-01-17: qty 10

## 2020-01-17 MED ORDER — DIPHENHYDRAMINE HCL 50 MG/ML IJ SOLN
INTRAMUSCULAR | Status: AC
  Filled 2020-01-17: qty 1

## 2020-01-17 MED ORDER — SODIUM CHLORIDE 0.9% FLUSH
10.0000 mL | Freq: Once | INTRAVENOUS | Status: AC
  Administered 2020-01-17: 13:00:00 10 mL
  Filled 2020-01-17: qty 10

## 2020-01-17 MED ORDER — DIPHENHYDRAMINE HCL 50 MG/ML IJ SOLN
25.0000 mg | Freq: Once | INTRAMUSCULAR | Status: AC
  Administered 2020-01-17: 25 mg via INTRAVENOUS

## 2020-01-17 MED ORDER — SODIUM CHLORIDE 0.9 % IV SOLN
72.0000 mg/m2 | Freq: Once | INTRAVENOUS | Status: AC
  Administered 2020-01-17: 120 mg via INTRAVENOUS
  Filled 2020-01-17: qty 20

## 2020-01-17 MED ORDER — SODIUM CHLORIDE 0.9 % IV SOLN
Freq: Once | INTRAVENOUS | Status: AC
  Filled 2020-01-17: qty 250

## 2020-01-17 MED ORDER — DEXAMETHASONE SODIUM PHOSPHATE 10 MG/ML IJ SOLN
INTRAMUSCULAR | Status: AC
  Filled 2020-01-17: qty 1

## 2020-01-17 NOTE — Patient Instructions (Signed)
[facility] Discharge Instructions for Patients Receiving Chemotherapy  Today you received the following chemotherapy agents:  Taxol.  To help prevent nausea and vomiting after your treatment, we encourage you to take your nausea medication as directed.   If you develop nausea and vomiting that is not controlled by your nausea medication, call the clinic.   BELOW ARE SYMPTOMS THAT SHOULD BE REPORTED IMMEDIATELY:  *FEVER GREATER THAN 100.5 F  *CHILLS WITH OR WITHOUT FEVER  NAUSEA AND VOMITING THAT IS NOT CONTROLLED WITH YOUR NAUSEA MEDICATION  *UNUSUAL SHORTNESS OF BREATH  *UNUSUAL BRUISING OR BLEEDING  TENDERNESS IN MOUTH AND THROAT WITH OR WITHOUT PRESENCE OF ULCERS  *URINARY PROBLEMS  *BOWEL PROBLEMS  UNUSUAL RASH Items with * indicate a potential emergency and should be followed up as soon as possible.  Feel free to call the clinic should you have any questions or concerns. The clinic phone number is [phone].  Please show the CHEMO ALERT CARD at check-in to the Emergency Department and triage nurse.   

## 2020-01-21 ENCOUNTER — Encounter: Payer: Self-pay | Admitting: Hematology and Oncology

## 2020-01-23 ENCOUNTER — Other Ambulatory Visit: Payer: Self-pay

## 2020-01-23 ENCOUNTER — Ambulatory Visit (HOSPITAL_COMMUNITY)
Admission: RE | Admit: 2020-01-23 | Discharge: 2020-01-23 | Disposition: A | Discharge status: Home / Self Care | Payer: Medicare PPO | Source: Ambulatory Visit | Attending: Hematology and Oncology | Admitting: Hematology and Oncology

## 2020-01-23 ENCOUNTER — Telehealth: Payer: Self-pay

## 2020-01-23 DIAGNOSIS — C561 Malignant neoplasm of right ovary: Secondary | ICD-10-CM | POA: Diagnosis not present

## 2020-01-23 MED ORDER — IOHEXOL 300 MG/ML  SOLN
100.0000 mL | Freq: Once | INTRAMUSCULAR | Status: AC | PRN
  Administered 2020-01-23: 100 mL via INTRAVENOUS

## 2020-01-23 MED ORDER — HEPARIN SOD (PORK) LOCK FLUSH 100 UNIT/ML IV SOLN
500.0000 [IU] | Freq: Once | INTRAVENOUS | Status: AC
  Administered 2020-01-23: 500 [IU] via INTRAVENOUS

## 2020-01-23 MED ORDER — IOHEXOL 9 MG/ML PO SOLN
ORAL | Status: AC
  Filled 2020-01-23: qty 1000

## 2020-01-23 MED ORDER — IOHEXOL 9 MG/ML PO SOLN
500.0000 mL | ORAL | Status: AC
  Administered 2020-01-23 (×2): 500 mL via ORAL

## 2020-01-23 MED ORDER — SODIUM CHLORIDE (PF) 0.9 % IJ SOLN
INTRAMUSCULAR | Status: AC
  Filled 2020-01-23: qty 50

## 2020-01-23 MED ORDER — HEPARIN SOD (PORK) LOCK FLUSH 100 UNIT/ML IV SOLN
INTRAVENOUS | Status: AC
  Filled 2020-01-23: qty 5

## 2020-01-23 NOTE — Telephone Encounter (Signed)
Called per Dr. Alvy Bimler, CT scan showed progression. Lab, flush and infusion appt canceled for tomorrow. She verbalized understanding. Offered e-visit for tomorrow at 1200 or 8 am. She prefers office visit and will bring one family member to visit.

## 2020-01-24 ENCOUNTER — Other Ambulatory Visit: Payer: Medicare PPO

## 2020-01-24 ENCOUNTER — Inpatient Hospital Stay: Payer: Medicare PPO | Attending: Gynecology | Admitting: Hematology and Oncology

## 2020-01-24 ENCOUNTER — Telehealth: Payer: Self-pay | Admitting: Hematology and Oncology

## 2020-01-24 ENCOUNTER — Other Ambulatory Visit: Payer: Self-pay | Admitting: Hematology and Oncology

## 2020-01-24 ENCOUNTER — Other Ambulatory Visit: Payer: Self-pay

## 2020-01-24 ENCOUNTER — Ambulatory Visit: Payer: Medicare PPO

## 2020-01-24 ENCOUNTER — Telehealth: Payer: Self-pay

## 2020-01-24 ENCOUNTER — Encounter: Payer: Self-pay | Admitting: Hematology and Oncology

## 2020-01-24 VITALS — BP 178/66 | HR 71 | Temp 98.3°F | Resp 18 | Ht 62.0 in | Wt 142.2 lb

## 2020-01-24 DIAGNOSIS — I10 Essential (primary) hypertension: Secondary | ICD-10-CM

## 2020-01-24 DIAGNOSIS — C787 Secondary malignant neoplasm of liver and intrahepatic bile duct: Secondary | ICD-10-CM | POA: Diagnosis not present

## 2020-01-24 DIAGNOSIS — C561 Malignant neoplasm of right ovary: Secondary | ICD-10-CM

## 2020-01-24 DIAGNOSIS — Z7189 Other specified counseling: Secondary | ICD-10-CM

## 2020-01-24 DIAGNOSIS — Z5111 Encounter for antineoplastic chemotherapy: Secondary | ICD-10-CM | POA: Diagnosis not present

## 2020-01-24 NOTE — Telephone Encounter (Signed)
-----   Message from Heath Lark, MD sent at 01/24/2020 12:31 PM EST ----- Regarding: echo for chemo Pls help schedule echo early next week Chemo is on Friday next week

## 2020-01-24 NOTE — Telephone Encounter (Signed)
Scheduled appt per 3/5 sch message - pt is aware of appt date and time

## 2020-01-24 NOTE — Assessment & Plan Note (Signed)
There is a component of anxiety and whitecoat hypertension She will continue her current medications and blood pressure monitoring at home

## 2020-01-24 NOTE — Assessment & Plan Note (Signed)
She has slight progression of liver metastasis but currently asymptomatic We will observe closely

## 2020-01-24 NOTE — Progress Notes (Signed)
DISCONTINUE OFF PATHWAY REGIMEN - Ovarian   OFF00010:Paclitaxel 80 mg/m2 Weekly:   Administer weekly:     Paclitaxel   **Always confirm dose/schedule in your pharmacy ordering system**  REASON: Disease Progression PRIOR TREATMENT: Off Pathway: Paclitaxel 80 mg/m2 Weekly TREATMENT RESPONSE: Progressive Disease (PD)  START ON PATHWAY REGIMEN - Ovarian     A cycle is every 28 days:     Liposomal doxorubicin   **Always confirm dose/schedule in your pharmacy ordering system**  Patient Characteristics: Recurrent or Progressive Disease, Fourth Line and Beyond, 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

## 2020-01-24 NOTE — Progress Notes (Signed)
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  I do not recommend trying cisplatin or oxaliplatin We discussed the risks, benefits, side effects of doxorubicin, gemcitabine, vinorelbine and Cytoxan  The decision was made based on publication below and supported by NCCN guidelines  Phase III Trial of Gemcitabine Compared With Pegylated Liposomal Doxorubicin in Progressive or Recurrent Ovarian Cancer Emilia Beck, [place], Domenica Lorusso, [place], [place]vin Donning, [place], [place], and [place]  We aimed at investigating the efficacy, tolerability, and quality of life (QOL) of gemcitabine (GEM) compared with pegylated liposomal doxorubicin (PLD) in the salvage treatment of recurrent ovarian cancer. Patients and Methods A phase III randomized multicenter trial was planned to compare GEM (1,000 mg/m2 on days 1, 8, and 15 every 28 days) with PLD (40 mg/m2 every 28 days) in ovarian cancer patients who experienced treatment failure with only one platinum/paclitaxel regimen and who experienced recurrence or progression within 12 months after completion of primary treatment. Results One hundred fifty-three patients were randomly assigned to PLD (n  52) or GEM (n  77). Treatment arms were well balanced for clinicopathologic characteristics. Grade 3 or 4 neutropenia was more frequent in GEM-treated patients versus PLD-treated patients (P  .007). Grade 3 or 4 palmar-plantar erythrodysesthesia was documented in a higher proportion of PLD patients (6%) versus GEM  patients (0%; P  .061). The overall response rate was 16% in the PLD arm compared with 29% in the GEM arm (P  .056). No statistically significant difference in time to progression (TTP) curves according to treatment allocation was documented (P  .411). However, a trend for more favorable overall survival was documented in the PLD arm compared with the GEM arm, although the P value was of borderline statistical significance (P  .048). Statistically significantly higher global QOL scores were found in PLD-treated patients at the first and second postbaseline QOL assessments. Conclusion GEM does not provide an advantage compared with PLD in terms of TTP in ovarian cancer patients who experience recurrence within 12 months after primary treatment but should be considered in the spectrum of drugs to be possibly used in the salvage setting. Tora Perches 83:419-622.  2008 by American Society of Clinical Oncology  We discussed some of the risks, benefits and side-effects of  Doxil   Some of the short term side-effects included, though not limited to, risk of fatigue, weight loss, tumor lysis syndrome, risk of allergic reactions, pancytopenia, life-threatening infections, need for transfusions of blood products, nausea, vomiting, change in bowel habits, hair loss, risk of congestive heart failure, admission to hospital for various reasons, and risks of death.   Long term side-effects are also discussed including permanent damage to nerve function, chronic fatigue, and rare secondary malignancy including bone marrow disorders.   The patient is aware that the response rates discussed earlier is not guaranteed.    After a long discussion, patient made an informed decision to proceed with the prescribed plan of care.   Patient education material was dispensed  She will need baseline echocardiogram and blood work before we proceed with treatment There is no contraindication for her to proceed with Covid  vaccine while on treatment.  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  She understood that goals of treatment is palliative   Orders Placed This Encounter  Procedures  . ECHOCARDIOGRAM COMPLETE    Standing Status:   Future    Standing Expiration Date:   04/25/2021    Order Specific Question:   Where should this test be performed    Answer:   [place]    Order Specific Question:   Perflutren DEFINITY (image enhancing agent) should be administered unless hypersensitivity or allergy exist    Answer:   Administer Perflutren    Order Specific Question:   Reason for exam-Echo    Answer:   Chemotherapy evaluation  v87.41 / v58.11    All questions were answered. The patient knows to call the clinic with any problems, questions or concerns. The total time spent in the appointment was 40 minutes encounter with patients including review of chart and various tests results, discussions about plan of care and coordination of care plan   Heath Lark, MD 01/24/2020 12:35 PM  INTERVAL HISTORY: Please see below for problem oriented charting. She returns with her husband for further follow-up She had multiple minor side effects from treatment but nothing significant She denies abdominal pain no recent changes in bowel habits  SUMMARY OF ONCOLOGIC HISTORY: Oncology History Overview Note  Neg genetics. Additional tests revealed ER: 80%, PR 3% MSI stable Progressed on Avastin and Taxol   Right ovarian epithelial cancer ([place])  07/01/2015 Initial Diagnosis   She presented with postmenopausal bleeding   07/02/2015  Imaging   US pelvis showed bilateral ovarian cyst   07/13/2015 Imaging   5.2 cm left adnexal cystic lesion, with indeterminate but probably benign characteristics. In a postmenopausal female, consider continued annual imaging followup with CT or MRI versus surgical evaluation.  Colonic diverticulosis. No radiographic evidence of diverticulitis.  Cholelithiasis.  No radiographic evidence of cholecystitis.  Small hiatal hernia.   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  The tumor involves serosal surface of the right ovary and adjacent right fallopian tube.  Cervix and endometrium were within normal limits. ER positive Peritoneal washing was positive.   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   08/13/2015 Tumor Marker   Patient's tumor was tested for the following markers: CA-125 Results of the tumor marker test revealed 96   08/25/2015 - 12/08/2015 Chemotherapy   The patient had 6 cycles of carboplatin and taxol   09/07/2015 Tumor Marker   Patient's tumor was tested for the following markers: CA-125 Results of the tumor marker test revealed 51   10/05/2015 Tumor Marker   Patient's tumor was tested for the following markers: CA-125 Results of the tumor marker test revealed 29   11/09/2015 Tumor Marker   Patient's tumor was tested for the following markers: CA-125 Results of the tumor marker test revealed 44   12/08/2015 Tumor Marker   Patient's tumor  was tested for the following markers: CA-125 Results of the tumor marker test revealed 30   12/15/2015 Genetic Testing   Genetics testing normal by [facility] Breast Ovarian panel 12-15-15   01/11/2016 Imaging   1. The only  finding of note are several adjacent peripheral abnormal hypodensities along the anterior superior spleen margin, differential diagnostic considerations including small peripheral interval splenic infarcts, splenic injury with subcapsular a fluid collections, or less likely metastatic disease to the splenic margin. This likely merits observation. 2. Other imaging findings of potential clinical significance: Small type 1 hiatal hernia. Aortoiliac atherosclerotic vascular disease. Lower lumbar spondylosis and degenerative disc disease. Gallstones with potential mild gallbladder wall thickening.   03/07/2016 Tumor Marker   Patient's tumor was tested for the following markers: CA-125 Results of the tumor marker test revealed 24.2   04/06/2016 Imaging   1. No evidence of a ovarian cancer metastasis. 2. No ascites. 3. Post hysterectomy and oophorectomy. 4. Cholelithiasis with several large gallstones   04/06/2016 Tumor Marker   Patient's tumor was tested for the following markers: CA-125 Results of the tumor marker test revealed 22.8   05/30/2016 Tumor Marker   Patient's tumor was tested for the following markers: CA-125 Results of the tumor marker test revealed 21   09/07/2016 Tumor Marker   Patient's tumor was tested for the following markers: CA-125 Results of the tumor marker test revealed 22.4   11/28/2016 Tumor Marker   Patient's tumor was tested for the following markers: CA-125 Results of the tumor marker test revealed 18.8   02/23/2017 Tumor Marker   Patient's tumor was tested for the following markers: CA-125 Results of the tumor marker test revealed 19.1   06/02/2017 Tumor Marker   Patient's tumor was tested for the following markers: CA-125 Results of the tumor marker test revealed 18.3   08/24/2017 Mammogram   Pt reports mammogram complete   08/28/2017 Tumor Marker   Patient's tumor was tested for the following markers: CA-125 Results of the tumor marker test revealed 21.1    12/01/2017 Tumor Marker   Patient's tumor was tested for the following markers: CA-125 Results of the tumor marker test revealed 31.2   12/13/2017 Imaging   New 2.7 cm peritoneal soft tissue mass in anterior left lower quadrant, consistent with metastatic disease.  No other sites of metastatic disease identified.  Incidental findings including: Cholelithiasis. Colonic diverticulosis. Tiny hiatal hernia. Aortic atherosclerosis.   01/09/2018 - 05/07/2018 Chemotherapy   The patient had carboplatin and taxol; After 2nd cycle, she developed carboplatin allergy. She received carboplatin desensitization protocol at [facility] for final 4 cycles, completed by 6/17. Avastin was added from 04/16/18 onwards   01/30/2018 Adverse Reaction   Potential carboplatin allergy is suspected. She received half the dose of prescribed carboplatin on cycle 2   05/31/2018 Tumor Marker   Patient's tumor was tested for the following markers: CA-125 Results of the tumor marker test revealed 26   05/31/2018 Imaging   1. Peritoneal soft tissue nodule identified on the previous study has decreased in the interval the. No progressive findings in the abdomen or pelvis on today's study to suggest disease progression. 2. Tiny pulmonary nodules, likely benign. Attention on follow-up recommended. 3. Cholelithiasis. 4.  Aortic Atherosclerois (ICD10-170.0) 5. Tiny hiatal hernia.     06/04/2018 - 07/30/2019 Chemotherapy   The patient is placed on Avastin for maintenance   06/25/2018 Tumor Marker   Patient's tumor was tested for the following markers: CA-125 Results of the tumor marker test revealed  22.5   08/06/2018 Tumor Marker   Patient's tumor was tested for the following markers: CA-125 Results of the tumor marker test revealed 20.7   09/14/2018 Imaging   Status post hysterectomy and bilateral salpingo-oophorectomy.  Stable soft tissue nodule beneath the left lower anterior abdominal wall, likely reflecting stable  peritoneal disease.  Scattered small subpleural nodules in the lungs bilaterally, measuring up to 3 mm, technically indeterminate although likely benign. Please note that Fleischner Society guidelines do not apply. Attention on follow-up is suggested.  No evidence of new/progressive metastatic disease.   09/14/2018 Tumor Marker   Patient's tumor was tested for the following markers: CA-125 Results of the tumor marker test revealed 21.3   10/30/2018 Tumor Marker   Patient's tumor was tested for the following markers: CA-125 Results of the tumor marker test revealed 20.8   12/12/2018 Tumor Marker   Patient's tumor was tested for the following markers: CA-125 Results of the tumor marker test revealed 19.7   01/01/2019 Tumor Marker   Patient's tumor was tested for the following markers: CA-125 Results of the tumor marker test revealed 21.6   01/22/2019 Tumor Marker   Patient's tumor was tested for the following markers: CA-125 Results of the tumor marker test revealed 21.3   02/12/2019 Tumor Marker   Patient's tumor was tested for the following markers: CA-125 Results of the tumor marker test revealed 21.6   04/16/2019 Tumor Marker   Patient's tumor was tested for the following markers: CA-125 Results of the tumor marker test revealed 21.7   05/07/2019 Tumor Marker   Patient's tumor was tested for the following markers: CA-125 Results of the tumor marker test revealed 27.3   05/28/2019 Tumor Marker   Patient's tumor was tested for the following markers: CA-125 Results of the tumor marker test revealed 24.5   07/09/2019 - 08/19/2019 Chemotherapy   The patient had bevacizumab for chemotherapy treatment.     07/09/2019 Tumor Marker   Patient's tumor was tested for the following markers: CA-125 Results of the tumor marker test revealed 23.3.   08/06/2019 Imaging   Ct abdomen and pelvis 1. New hypodense lesions of the left lobe of the liver measuring 2.5 x 2.5 cm (series 2, image 16)  and right lobe of the liver measuring 1.1 x 1.1 cm (series 2, image 23), concerning for metastatic disease.   2. Interval enlargement of an irregular nodule of the omentum measuring 1.0 x 1.0 cm, previously 1.0 x 0.5 cm (series 2, image 51), concerning for peritoneal disease.   3.  Status post hysterectomy.   4. Other chronic and incidental findings as detailed above. Aortic Atherosclerosis (ICD10-I70.0).   08/16/2019 - 01/17/2020 Chemotherapy   The patient had weekly Taxol for chemotherapy treatment.     08/23/2019 Tumor Marker   Patient's tumor was tested for the following markers: CA-125 Results of the tumor marker test revealed 33.7.   09/06/2019 Tumor Marker   Patient's tumor was tested for the following markers: CA-125 Results of the tumor marker test revealed 32.2   10/14/2019 Imaging   1. Interval decrease in hypodense masses of the left lobe and right lobe of the liver, mass in the left lobe measuring 1.7 x 1.6 cm, previously 2.5 x 2.5 cm (series 2, image 14) and in the right lobe of the liver measuring 0.8 x 0.7 cm, previously 1.1 x 1.1 cm (series 2, image 22).   2. Interval decrease in size of an omental nodule, measuring 0.8 x 0.7 cm, previously 1.0  x 1.0 cm (series 2, image 55).   3. Findings are consistent with improved metastatic disease. No new evidence of metastatic disease in the abdomen or pelvis.   4.  Status post hysterectomy.   5.  Cholelithiasis.   6.  Aortic Atherosclerosis (ICD10-I70.0).   01/23/2020 Imaging   1. 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 2. Slight increase in size of left lower quadrant omental soft tissue nodule, consistent with metastatic disease. 3. No new sites of metastatic disease identified. 4. Stable cholelithiasis and gallbladder wall thickening. No evidence of acute cholecystitis. 5. Colonic diverticulosis. No radiographic evidence of diverticulitis. 6. Tiny hiatal hernia.   01/31/2020 -  Chemotherapy   The patient  had DOXOrubicin HCL LIPOSOMAL (DOXIL) 68 mg in dextrose 5 % 250 mL chemo infusion, 40 mg/m2 = 68 mg, Intravenous,  Once, 0 of 6 cycles  for chemotherapy treatment.      REVIEW OF SYSTEMS:   Constitutional: Denies fevers, chills or abnormal weight loss Eyes: Denies blurriness of vision Ears, nose, mouth, throat, and face: Denies mucositis or sore throat Respiratory: Denies cough, dyspnea or wheezes Cardiovascular: Denies palpitation, chest discomfort or lower extremity swelling Gastrointestinal:  Denies nausea, heartburn or change in bowel habits Skin: Denies abnormal skin rashes Lymphatics: Denies new lymphadenopathy or easy bruising Neurological:Denies numbness, tingling or new weaknesses Behavioral/Psych: Mood is stable, no new changes  All other systems were reviewed with the patient and are negative.  I have reviewed the past medical history, past surgical history, social history and family history with the patient and they are unchanged from previous note.  ALLERGIES:  is allergic to carboplatin.  MEDICATIONS:  Current Outpatient Medications  Medication Sig Dispense Refill  . amLODipine (NORVASC) 10 MG tablet Take 1 tablet (10 mg total) by mouth daily. 90 tablet 3  . atenolol (TENORMIN) 25 MG tablet Take 1 tablet (25 mg total) by mouth daily. 30 tablet 9  . atorvastatin (LIPITOR) 10 MG tablet Take 1 tablet (10 mg total) by mouth daily. 30 tablet 2  . Coenzyme Q10 (CO Q 10) 100 MG CAPS Take 1 capsule by mouth daily.     . Glucosamine-Chondroit-Vit C-Mn (GLUCOSAMINE 1500 COMPLEX PO) Take 1 capsule by mouth 2 (two) times daily.    [name] lidocaine-prilocaine (EMLA) cream Apply to affected area once (Patient not taking: Reported on 11/14/2019) 30 g 3  . loratadine (CLARITIN) 10 MG tablet Take 10 mg by mouth daily as needed for allergies (hives).    . Multiple Vitamin (MULTIVITAMIN) capsule Take 1 capsule by mouth daily.     . Omega-3 Fatty Acids (OMEGA-3 FISH OIL) 300 MG CAPS Take 1  capsule by mouth daily.     . ondansetron (ZOFRAN) 8 MG tablet Take 1 tablet (8 mg total) by mouth every 8 (eight) hours as needed for refractory nausea / vomiting. 30 tablet 1  . Polyethyl Glycol-Propyl Glycol (SYSTANE ULTRA OP) Apply 1 drop to eye 2 (two) times daily at 10 AM and 5 PM.    . Probiotic Product (PROBIOTIC ADVANCED PO) Take 1 capsule by mouth daily.    . prochlorperazine (COMPAZINE) 10 MG tablet Take 1 tablet (10 mg total) by mouth every 6 (six) hours as needed (Nausea or vomiting). 60 tablet 1  . vitamin E 400 UNIT capsule Take 400 Units by mouth every evening.      No current facility-administered medications for this visit.    PHYSICAL EXAMINATION: ECOG PERFORMANCE STATUS: 1 - Symptomatic but completely ambulatory  Vitals:   01/24/20 1212  BP: (!) 178/66  Pulse: 71  Resp: 18  Temp: 98.3 F (36.8 C)  SpO2: 100%   Filed Weights   01/24/20 1212  Weight: 142 lb 3.2 oz (64.5 kg)    GENERAL:alert, no distress and comfortable NEURO: alert & oriented x 3 with fluent speech, no focal motor/sensory deficits  LABORATORY DATA:  I have reviewed the data as listed    Component Value Date/Time   NA 142 01/17/2020 1254   NA 142 11/28/2016 0916   K 4.2 01/17/2020 1254   K 3.9 11/28/2016 0916   CL 109 01/17/2020 1254   CO2 24 01/17/2020 1254   CO2 28 11/28/2016 0916   GLUCOSE 103 (H) 01/17/2020 1254   GLUCOSE 78 11/28/2016 0916   BUN 27 (H) 01/17/2020 1254   BUN 16.8 11/28/2016 0916   CREATININE 0.79 01/17/2020 1254   CREATININE 1.15 (H) 05/28/2019 1345   CREATININE 0.8 11/28/2016 0916   CALCIUM 9.0 01/17/2020 1254   CALCIUM 10.5 (H) 11/28/2016 0916   PROT 6.6 01/17/2020 1254   PROT 7.3 11/28/2016 0916   ALBUMIN 3.7 01/17/2020 1254   ALBUMIN 4.3 11/28/2016 0916   AST 23 01/17/2020 1254   AST 23 05/28/2019 1345   AST 19 11/28/2016 0916   ALT 15 01/17/2020 1254   ALT 14 05/28/2019 1345   ALT 19 11/28/2016 0916   ALKPHOS 53 01/17/2020 1254   ALKPHOS 50  11/28/2016 0916   BILITOT 0.4 01/17/2020 1254   BILITOT 0.3 05/28/2019 1345   BILITOT 0.48 11/28/2016 0916   GFRNONAA >60 01/17/2020 1254   GFRNONAA 46 (L) 05/28/2019 1345   GFRAA >60 01/17/2020 1254   GFRAA 53 (L) 05/28/2019 1345    No results found for: SPEP, UPEP  Lab Results  Component Value Date   WBC 3.7 (L) 01/17/2020   NEUTROABS 1.9 01/17/2020   HGB 12.1 01/17/2020   HCT 37.3 01/17/2020   MCV 92.6 01/17/2020   PLT 145 (L) 01/17/2020      Chemistry      Component Value Date/Time   NA 142 01/17/2020 1254   NA 142 11/28/2016 0916   K 4.2 01/17/2020 1254   K 3.9 11/28/2016 0916   CL 109 01/17/2020 1254   CO2 24 01/17/2020 1254   CO2 28 11/28/2016 0916   BUN 27 (H) 01/17/2020 1254   BUN 16.8 11/28/2016 0916   CREATININE 0.79 01/17/2020 1254   CREATININE 1.15 (H) 05/28/2019 1345   CREATININE 0.8 11/28/2016 0916      Component Value Date/Time   CALCIUM 9.0 01/17/2020 1254   CALCIUM 10.5 (H) 11/28/2016 0916   ALKPHOS 53 01/17/2020 1254   ALKPHOS 50 11/28/2016 0916   AST 23 01/17/2020 1254   AST 23 05/28/2019 1345   AST 19 11/28/2016 0916   ALT 15 01/17/2020 1254   ALT 14 05/28/2019 1345   ALT 19 11/28/2016 0916   BILITOT 0.4 01/17/2020 1254   BILITOT 0.3 05/28/2019 1345   BILITOT 0.48 11/28/2016 0916       RADIOGRAPHIC STUDIES: I have reviewed multiple imaging studies with the patient I have personally reviewed the radiological images as listed and agreed with the findings in the report. CT ABDOMEN PELVIS W CONTRAST  Result Date: 01/23/2020 CLINICAL DATA:  Follow-up metastatic ovarian carcinoma. Currently undergoing chemotherapy. Restaging. EXAM: CT ABDOMEN AND PELVIS WITH CONTRAST TECHNIQUE: Multidetector CT imaging of the abdomen and pelvis was performed using the standard protocol following bolus administration of intravenous contrast.  CONTRAST:  171m OMNIPAQUE IOHEXOL 300 MG/ML  SOLN COMPARISON:  10/15/2019 FINDINGS: Lower Chest: No acute findings.  Hepatobiliary: Hypovascular mass in the central left hepatic lobe shows increase in size, currently measuring 3.0 x 2.5 cm compared to 1.7 x 1.6 cm previously. A tiny low-attenuation lesion in the posterior right lobe measures 4 mm on image 25/series 2, decreased in size from 8 mm on prior study. No new liver lesions identified. Cholelithiasis is stable as well as wall thickening in the gallbladder fundus. No evidence of pericholecystic inflammatory changes or fluid. No evidence of biliary ductal dilatation. Pancreas:  No mass or inflammatory changes. Spleen: Within normal limits in size and appearance. Adrenals/Urinary Tract: No masses identified. No evidence of ureteral calculi or hydronephrosis. Stomach/Bowel: Tiny hiatal hernia noted. No evidence of obstruction, inflammatory process or abnormal fluid collections. Diverticulosis is seen mainly involving the sigmoid colon, however there is no evidence of diverticulitis. Vascular/Lymphatic: No pathologically enlarged lymph nodes. No abdominal aortic aneurysm. Aortic atherosclerosis incidentally noted. Reproductive: Prior hysterectomy again noted. No evidence of pelvic mass or ascites. Other: Irregular soft tissue nodule in the left lower quadrant omental fat currently measures 1.3 x 0.9 cm on image 52/2, mildly increased in size from 1.0 by 0.6 cm previously. No new omental or peritoneal nodules identified. Musculoskeletal:  No suspicious bone lesions identified. IMPRESSION: 1. 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 2. Slight increase in size of left lower quadrant omental soft tissue nodule, consistent with metastatic disease. 3. No new sites of metastatic disease identified. 4. Stable cholelithiasis and gallbladder wall thickening. No evidence of acute cholecystitis. 5. Colonic diverticulosis. No radiographic evidence of diverticulitis. 6. Tiny hiatal hernia. Electronically Signed   By: JMarlaine HindM.D.   On: 01/23/2020 11:44

## 2020-01-24 NOTE — Assessment & Plan Note (Signed)
We had extensive discussion about goals of care She has advanced directive and living will at home.  She understood that goals of treatment is palliative

## 2020-01-24 NOTE — Assessment & Plan Note (Signed)
I have reviewed multiple imaging studies with the patient and her husband Unfortunately, the patient has progression of disease We reviewed the current guidelines The patient is allergic to carboplatin.  I do not recommend trying cisplatin or oxaliplatin We discussed the risks, benefits, side effects of doxorubicin, gemcitabine, vinorelbine and Cytoxan  The decision was made based on publication below and supported by NCCN guidelines  Phase III Trial of Gemcitabine Compared With Pegylated Liposomal Doxorubicin in Progressive or Recurrent Ovarian Cancer Emilia Beck, Snyder Ludovisi, Domenica Lorusso, [place], Murvin Donning, [place], [place], and [place]  We aimed at investigating the efficacy, tolerability, and quality of life (QOL) of gemcitabine (GEM) compared with pegylated liposomal doxorubicin (PLD) in the salvage treatment of recurrent ovarian cancer. Patients and Methods A phase III randomized multicenter trial was planned to compare GEM (1,000 mg/m2 on days 1, 8, and 15 every 28 days) with PLD (40 mg/m2 every 28 days) in ovarian cancer patients who experienced treatment failure with only one platinum/paclitaxel regimen and who experienced recurrence or progression within 12 months after completion of primary treatment. Results One hundred fifty-three patients were randomly assigned to PLD (n  18) or GEM (n  77). Treatment arms were well balanced for clinicopathologic characteristics. Grade 3 or 4 neutropenia was more frequent in GEM-treated patients versus PLD-treated patients (P  .007). Grade 3 or 4 palmar-plantar erythrodysesthesia was documented in a higher proportion of PLD patients (6%) versus GEM patients (0%; P  .061). The overall response rate was 16% in the PLD arm compared with 29% in the GEM arm (P  .056). No statistically significant difference in time to progression (TTP) curves  according to treatment allocation was documented (P  .411). However, a trend for more favorable overall survival was documented in the PLD arm compared with the GEM arm, although the P value was of borderline statistical significance (P  .048). Statistically significantly higher global QOL scores were found in PLD-treated patients at the first and second postbaseline QOL assessments. Conclusion GEM does not provide an advantage compared with PLD in terms of TTP in ovarian cancer patients who experience recurrence within 12 months after primary treatment but should be considered in the spectrum of drugs to be possibly used in the salvage setting. Tora Perches [id number].  2008 by American Society of Clinical Oncology  We discussed some of the risks, benefits and side-effects of  Doxil   Some of the short term side-effects included, though not limited to, risk of fatigue, weight loss, tumor lysis syndrome, risk of allergic reactions, pancytopenia, life-threatening infections, need for transfusions of blood products, nausea, vomiting, change in bowel habits, hair loss, risk of congestive heart failure, admission to hospital for various reasons, and risks of death.   Long term side-effects are also discussed including permanent damage to nerve function, chronic fatigue, and rare secondary malignancy including bone marrow disorders.   The patient is aware that the response rates discussed earlier is not guaranteed.    After a long discussion, patient made an informed decision to proceed with the prescribed plan of care.   Patient education material was dispensed  She will need baseline echocardiogram and blood work before we proceed with treatment There is no contraindication for her to proceed with Covid vaccine while on treatment.

## 2020-01-24 NOTE — Telephone Encounter (Signed)
Called and left a message that echo is scheduled for Monday 3/8 at 10 am, arrive at 0945 to [facility]. Ask her to call the office for questions.

## 2020-01-27 ENCOUNTER — Ambulatory Visit (HOSPITAL_COMMUNITY)
Admission: RE | Admit: 2020-01-27 | Discharge: 2020-01-27 | Disposition: A | Discharge status: Home / Self Care | Payer: Medicare PPO | Source: Ambulatory Visit | Attending: Hematology and Oncology | Admitting: Hematology and Oncology

## 2020-01-27 ENCOUNTER — Other Ambulatory Visit: Payer: Self-pay

## 2020-01-27 DIAGNOSIS — I10 Essential (primary) hypertension: Secondary | ICD-10-CM | POA: Diagnosis not present

## 2020-01-27 DIAGNOSIS — Z5111 Encounter for antineoplastic chemotherapy: Secondary | ICD-10-CM | POA: Diagnosis not present

## 2020-01-27 DIAGNOSIS — C561 Malignant neoplasm of right ovary: Secondary | ICD-10-CM | POA: Insufficient documentation

## 2020-01-27 DIAGNOSIS — Z01818 Encounter for other preprocedural examination: Secondary | ICD-10-CM | POA: Diagnosis present

## 2020-01-27 DIAGNOSIS — E785 Hyperlipidemia, unspecified: Secondary | ICD-10-CM | POA: Diagnosis not present

## 2020-01-27 DIAGNOSIS — Z87891 Personal history of nicotine dependence: Secondary | ICD-10-CM | POA: Insufficient documentation

## 2020-01-27 NOTE — Progress Notes (Signed)
  Echocardiogram 2D Echocardiogram has been performed.  Emily Hull 01/27/2020, 10:49 AM

## 2020-01-29 ENCOUNTER — Encounter: Payer: Self-pay | Admitting: Hematology and Oncology

## 2020-01-29 ENCOUNTER — Telehealth: Payer: Self-pay

## 2020-01-29 NOTE — Telephone Encounter (Signed)
Spoke with pt by phone and gave below msg.  

## 2020-01-29 NOTE — Telephone Encounter (Signed)
-----   Message from Heath Lark, MD sent at 01/29/2020  7:25 AM EST ----- Regarding: pls let her know ECHO is nromal

## 2020-01-30 ENCOUNTER — Encounter: Payer: Self-pay | Admitting: Hematology and Oncology

## 2020-01-31 ENCOUNTER — Inpatient Hospital Stay: Payer: Medicare PPO

## 2020-01-31 ENCOUNTER — Other Ambulatory Visit: Payer: Self-pay

## 2020-01-31 ENCOUNTER — Ambulatory Visit: Payer: Medicare PPO

## 2020-01-31 ENCOUNTER — Other Ambulatory Visit: Payer: Medicare PPO

## 2020-01-31 ENCOUNTER — Telehealth: Payer: Self-pay

## 2020-01-31 ENCOUNTER — Other Ambulatory Visit: Payer: Self-pay | Admitting: Hematology and Oncology

## 2020-01-31 VITALS — BP 155/64 | HR 60 | Temp 98.2°F | Resp 16 | Ht 62.5 in | Wt 143.5 lb

## 2020-01-31 DIAGNOSIS — E7800 Pure hypercholesterolemia, unspecified: Secondary | ICD-10-CM

## 2020-01-31 DIAGNOSIS — C787 Secondary malignant neoplasm of liver and intrahepatic bile duct: Secondary | ICD-10-CM

## 2020-01-31 DIAGNOSIS — C561 Malignant neoplasm of right ovary: Secondary | ICD-10-CM

## 2020-01-31 DIAGNOSIS — Z5111 Encounter for antineoplastic chemotherapy: Secondary | ICD-10-CM | POA: Diagnosis not present

## 2020-01-31 DIAGNOSIS — Z7189 Other specified counseling: Secondary | ICD-10-CM

## 2020-01-31 LAB — CBC WITH DIFFERENTIAL/PLATELET
Abs Immature Granulocytes: 0.01 10*3/uL (ref 0.00–0.07)
Basophils Absolute: 0.1 10*3/uL (ref 0.0–0.1)
Basophils Relative: 2 %
Eosinophils Absolute: 0.2 10*3/uL (ref 0.0–0.5)
Eosinophils Relative: 5 %
HCT: 36.7 % (ref 36.0–46.0)
Hemoglobin: 11.8 g/dL — ABNORMAL LOW (ref 12.0–15.0)
Immature Granulocytes: 0 %
Lymphocytes Relative: 28 %
Lymphs Abs: 1 10*3/uL (ref 0.7–4.0)
MCH: 30 pg (ref 26.0–34.0)
MCHC: 32.2 g/dL (ref 30.0–36.0)
MCV: 93.4 fL (ref 80.0–100.0)
Monocytes Absolute: 0.5 10*3/uL (ref 0.1–1.0)
Monocytes Relative: 14 %
Neutro Abs: 1.9 10*3/uL (ref 1.7–7.7)
Neutrophils Relative %: 51 %
Platelets: 125 10*3/uL — ABNORMAL LOW (ref 150–400)
RBC: 3.93 MIL/uL (ref 3.87–5.11)
RDW: 16 % — ABNORMAL HIGH (ref 11.5–15.5)
WBC: 3.7 10*3/uL — ABNORMAL LOW (ref 4.0–10.5)
nRBC: 0 % (ref 0.0–0.2)

## 2020-01-31 LAB — COMPREHENSIVE METABOLIC PANEL
ALT: 15 U/L (ref 0–44)
AST: 22 U/L (ref 15–41)
Albumin: 3.7 g/dL (ref 3.5–5.0)
Alkaline Phosphatase: 48 U/L (ref 38–126)
Anion gap: 8 (ref 5–15)
BUN: 27 mg/dL — ABNORMAL HIGH (ref 8–23)
CO2: 24 mmol/L (ref 22–32)
Calcium: 8.9 mg/dL (ref 8.9–10.3)
Chloride: 109 mmol/L (ref 98–111)
Creatinine, Ser: 0.78 mg/dL (ref 0.44–1.00)
GFR calc Af Amer: >60 mL/min (ref >60)
GFR calc non Af Amer: >60 mL/min (ref >60)
Glucose, Bld: 92 mg/dL (ref 70–99)
Potassium: 3.8 mmol/L (ref 3.5–5.1)
Sodium: 141 mmol/L (ref 135–145)
Total Bilirubin: 0.5 mg/dL (ref 0.3–1.2)
Total Protein: 6.2 g/dL — ABNORMAL LOW (ref 6.5–8.1)

## 2020-01-31 LAB — LIPID PANEL
Cholesterol: 179 mg/dL (ref 0–200)
HDL: 39 mg/dL — ABNORMAL LOW (ref >40)
LDL Cholesterol: 95 mg/dL (ref 0–99)
Total CHOL/HDL Ratio: 4.6 RATIO
Triglycerides: 224 mg/dL — ABNORMAL HIGH (ref <150)
VLDL: 45 mg/dL — ABNORMAL HIGH (ref 0–40)

## 2020-01-31 MED ORDER — DEXAMETHASONE SODIUM PHOSPHATE 10 MG/ML IJ SOLN
INTRAMUSCULAR | Status: AC
  Filled 2020-01-31: qty 1

## 2020-01-31 MED ORDER — DOXORUBICIN HCL LIPOSOMAL CHEMO INJECTION 2 MG/ML
40.0000 mg/m2 | Freq: Once | INTRAVENOUS | Status: AC
  Administered 2020-01-31: 68 mg via INTRAVENOUS
  Filled 2020-01-31: qty 10

## 2020-01-31 MED ORDER — DEXTROSE 5 % IV SOLN
Freq: Once | INTRAVENOUS | Status: AC
  Filled 2020-01-31: qty 250

## 2020-01-31 MED ORDER — SODIUM CHLORIDE 0.9% FLUSH
10.0000 mL | INTRAVENOUS | Status: DC | PRN
  Administered 2020-01-31: 13:00:00 10 mL
  Filled 2020-01-31: qty 10

## 2020-01-31 MED ORDER — HEPARIN SOD (PORK) LOCK FLUSH 100 UNIT/ML IV SOLN
500.0000 [IU] | Freq: Once | INTRAVENOUS | Status: DC
  Filled 2020-01-31: qty 5

## 2020-01-31 MED ORDER — HEPARIN SOD (PORK) LOCK FLUSH 100 UNIT/ML IV SOLN
500.0000 [IU] | Freq: Once | INTRAVENOUS | Status: AC | PRN
  Administered 2020-01-31: 13:00:00 500 [IU]
  Filled 2020-01-31: qty 5

## 2020-01-31 MED ORDER — DEXAMETHASONE SODIUM PHOSPHATE 10 MG/ML IJ SOLN
10.0000 mg | Freq: Once | INTRAMUSCULAR | Status: AC
  Administered 2020-01-31: 10:00:00 10 mg via INTRAVENOUS

## 2020-01-31 MED ORDER — SODIUM CHLORIDE 0.9% FLUSH
10.0000 mL | Freq: Once | INTRAVENOUS | Status: AC
  Administered 2020-01-31: 10 mL
  Filled 2020-01-31: qty 10

## 2020-01-31 NOTE — Telephone Encounter (Signed)
Faxed lipid Panel to Dr. Doreene Nest at [phone]. Received confirmation.

## 2020-01-31 NOTE — Telephone Encounter (Signed)
-----   Message from Heath Lark, MD sent at 01/31/2020 12:51 PM EST ----- Regarding: lipid panel Pls forward results to her PCP ----- Message ----- From: Interface, Lab In [place] Sent: 01/31/2020   9:43 AM EST To: Heath Lark, MD

## 2020-01-31 NOTE — Patient Instructions (Signed)
[facility] Discharge Instructions for Patients Receiving Chemotherapy  Today you received the following chemotherapy agent: Liposomal Doxorubicin  To help prevent nausea and vomiting after your treatment, we encourage you to take your nausea medication as directed by your MD.   If you develop nausea and vomiting that is not controlled by your nausea medication, call the clinic.   BELOW ARE SYMPTOMS THAT SHOULD BE REPORTED IMMEDIATELY:  *FEVER GREATER THAN 100.5 F  *CHILLS WITH OR WITHOUT FEVER  NAUSEA AND VOMITING THAT IS NOT CONTROLLED WITH YOUR NAUSEA MEDICATION  *UNUSUAL SHORTNESS OF BREATH  *UNUSUAL BRUISING OR BLEEDING  TENDERNESS IN MOUTH AND THROAT WITH OR WITHOUT PRESENCE OF ULCERS  *URINARY PROBLEMS  *BOWEL PROBLEMS  UNUSUAL RASH Items with * indicate a potential emergency and should be followed up as soon as possible.  Feel free to call the clinic should you have any questions or concerns. The clinic phone number is [phone].  Please show the [facility] at check-in to the Emergency Department and triage nurse.  Coronavirus (COVID-19) Are you at risk?  Are you at risk for the Coronavirus (COVID-19)?  To be considered HIGH RISK for Coronavirus (COVID-19), you have to meet the following criteria:  . Traveled to [place], [place], [place], [place] or [place]; or in the [place] to [place], [place], [place], or [place]; and have fever, cough, and shortness of breath within the last 2 weeks of travel OR . Been in close contact with a person diagnosed with COVID-19 within the last 2 weeks and have fever, cough, and shortness of breath . IF YOU DO NOT MEET THESE CRITERIA, YOU ARE CONSIDERED LOW RISK FOR COVID-19.  What to do if you are HIGH RISK for COVID-19?  [name] If you are having a medical emergency, call 911. . Seek medical care right away. Before you go to a doctor's office, urgent care or emergency department, call ahead and  tell them about your recent travel, contact with someone diagnosed with COVID-19, and your symptoms. You should receive instructions from your physician's office regarding next steps of care.  . When you arrive at healthcare provider, tell the healthcare staff immediately you have returned from visiting [place], [place], [place], [place] or [place]; or traveled in the [place] to [place], [place], [place], or [place]; in the last two weeks or you have been in close contact with a person diagnosed with COVID-19 in the last 2 weeks.   . Tell the health care staff about your symptoms: fever, cough and shortness of breath. . After you have been seen by a medical provider, you will be either: o Tested for (COVID-19) and discharged home on quarantine except to seek medical care if symptoms worsen, and asked to  - Stay home and avoid contact with others until you get your results (4-5 days)  - Avoid travel on public transportation if possible (such as bus, train, or airplane) or o Sent to the Emergency Department by EMS for evaluation, COVID-19 testing, and possible admission depending on your condition and test results.  What to do if you are LOW RISK for COVID-19?  Reduce your risk of any infection by using the same precautions used for avoiding the common cold or flu:  [name] Wash your hands often with soap and warm water for at least 20 seconds.  If soap and water are not readily available, use an alcohol-based hand sanitizer with at least 60% alcohol.  [name]  If coughing or sneezing, cover your mouth and nose by coughing or sneezing into the elbow areas of your shirt or coat, into a tissue or into your sleeve (not your hands). . Avoid shaking hands with others and consider head nods or verbal greetings only. . Avoid touching your eyes, nose, or mouth with unwashed hands.  . Avoid close contact with people who are sick. . Avoid places or events with large numbers of people in one location, like  concerts or sporting events. . Carefully consider travel plans you have or are making. . If you are planning any travel outside or inside the [place], visit the CDC's Travelers' Health webpage for the latest health notices. . If you have some symptoms but not all symptoms, continue to monitor at home and seek medical attention if your symptoms worsen. . If you are having a medical emergency, call 911.   ADDITIONAL HEALTHCARE OPTIONS FOR PATIENTS  [facility] / e-Visit: [email or web address]         [facility]: [phone]  [facility]: [phone]                   [facility]: [phone]  

## 2020-02-03 ENCOUNTER — Telehealth: Payer: Self-pay | Admitting: Emergency Medicine

## 2020-02-03 NOTE — Telephone Encounter (Signed)
Chemo f/u call.  Pt denies any concerns/questions at this time.  Pt aware to f/u before her next appt as needed.  Reports eating and drinking consistently.

## 2020-02-03 NOTE — Telephone Encounter (Signed)
-----   Message from Lester Andover, RN sent at 01/31/2020 12:56 PM EST ----- Regarding: Dr. Alvy Bimler Pt. received first time Doxorubicin Liposomal and tolerated without difficulty. Pt. has received Taxol in the past.

## 2020-02-12 ENCOUNTER — Encounter: Payer: Self-pay | Admitting: Hematology and Oncology

## 2020-02-13 ENCOUNTER — Telehealth: Payer: Self-pay

## 2020-02-13 NOTE — Telephone Encounter (Signed)
Called per [employer]. Per Dr. Alvy Bimler, told her to not worry so much about the dates for the covid vaccine and get whatever brand she can get. Ask her if mouth sores prevented her from eating. She said no, she is able to eat. Instructed to call the office if needed and if magic mouth wash Rx needed. She verbalized understanding.

## 2020-02-18 ENCOUNTER — Encounter: Payer: Self-pay | Admitting: Hematology and Oncology

## 2020-02-24 NOTE — Progress Notes (Signed)
Pharmacist Chemotherapy Monitoring - Follow Up Assessment    I verify that I have reviewed each item in the below checklist:  . Regimen for the patient is scheduled for the appropriate day and plan matches scheduled date. [name] Appropriate non-routine labs are ordered dependent on drug ordered. . If applicable, additional medications reviewed and ordered per protocol based on lifetime cumulative doses and/or treatment regimen.   Plan for follow-up and/or issues identified: No . I-vent associated with next due treatment: No . MD and/or nursing notified: No  Emily Hull [facility] 02/24/2020 1:21 PM

## 2020-02-27 ENCOUNTER — Encounter: Payer: Self-pay | Admitting: Hematology and Oncology

## 2020-02-28 ENCOUNTER — Inpatient Hospital Stay: Payer: Medicare PPO | Attending: Gynecology

## 2020-02-28 ENCOUNTER — Inpatient Hospital Stay: Payer: Medicare PPO

## 2020-02-28 ENCOUNTER — Encounter: Payer: Self-pay | Admitting: Hematology and Oncology

## 2020-02-28 ENCOUNTER — Other Ambulatory Visit: Payer: Self-pay

## 2020-02-28 ENCOUNTER — Inpatient Hospital Stay (HOSPITAL_BASED_OUTPATIENT_CLINIC_OR_DEPARTMENT_OTHER): Payer: Medicare PPO | Admitting: Hematology and Oncology

## 2020-02-28 DIAGNOSIS — Z5111 Encounter for antineoplastic chemotherapy: Secondary | ICD-10-CM | POA: Insufficient documentation

## 2020-02-28 DIAGNOSIS — C561 Malignant neoplasm of right ovary: Secondary | ICD-10-CM

## 2020-02-28 DIAGNOSIS — I10 Essential (primary) hypertension: Secondary | ICD-10-CM | POA: Diagnosis not present

## 2020-02-28 DIAGNOSIS — Z7189 Other specified counseling: Secondary | ICD-10-CM

## 2020-02-28 DIAGNOSIS — C787 Secondary malignant neoplasm of liver and intrahepatic bile duct: Secondary | ICD-10-CM

## 2020-02-28 DIAGNOSIS — E7800 Pure hypercholesterolemia, unspecified: Secondary | ICD-10-CM

## 2020-02-28 DIAGNOSIS — D61818 Other pancytopenia: Secondary | ICD-10-CM

## 2020-02-28 LAB — CBC WITH DIFFERENTIAL/PLATELET
Abs Immature Granulocytes: 0.01 10*3/uL (ref 0.00–0.07)
Basophils Absolute: 0.1 10*3/uL (ref 0.0–0.1)
Basophils Relative: 2 %
Eosinophils Absolute: 0.1 10*3/uL (ref 0.0–0.5)
Eosinophils Relative: 2 %
HCT: 34.2 % — ABNORMAL LOW (ref 36.0–46.0)
Hemoglobin: 11.1 g/dL — ABNORMAL LOW (ref 12.0–15.0)
Immature Granulocytes: 0 %
Lymphocytes Relative: 31 %
Lymphs Abs: 1.1 10*3/uL (ref 0.7–4.0)
MCH: 30.6 pg (ref 26.0–34.0)
MCHC: 32.5 g/dL (ref 30.0–36.0)
MCV: 94.2 fL (ref 80.0–100.0)
Monocytes Absolute: 0.9 10*3/uL (ref 0.1–1.0)
Monocytes Relative: 24 %
Neutro Abs: 1.5 10*3/uL — ABNORMAL LOW (ref 1.7–7.7)
Neutrophils Relative %: 41 %
Platelets: 124 10*3/uL — ABNORMAL LOW (ref 150–400)
RBC: 3.63 MIL/uL — ABNORMAL LOW (ref 3.87–5.11)
RDW: 16.2 % — ABNORMAL HIGH (ref 11.5–15.5)
WBC: 3.6 10*3/uL — ABNORMAL LOW (ref 4.0–10.5)
nRBC: 0 % (ref 0.0–0.2)

## 2020-02-28 LAB — COMPREHENSIVE METABOLIC PANEL
ALT: 12 U/L (ref 0–44)
AST: 23 U/L (ref 15–41)
Albumin: 3.5 g/dL (ref 3.5–5.0)
Alkaline Phosphatase: 55 U/L (ref 38–126)
Anion gap: 7 (ref 5–15)
BUN: 17 mg/dL (ref 8–23)
CO2: 24 mmol/L (ref 22–32)
Calcium: 9 mg/dL (ref 8.9–10.3)
Chloride: 108 mmol/L (ref 98–111)
Creatinine, Ser: 0.79 mg/dL (ref 0.44–1.00)
GFR calc Af Amer: >60 mL/min (ref >60)
GFR calc non Af Amer: >60 mL/min (ref >60)
Glucose, Bld: 96 mg/dL (ref 70–99)
Potassium: 4 mmol/L (ref 3.5–5.1)
Sodium: 139 mmol/L (ref 135–145)
Total Bilirubin: 0.5 mg/dL (ref 0.3–1.2)
Total Protein: 6.4 g/dL — ABNORMAL LOW (ref 6.5–8.1)

## 2020-02-28 MED ORDER — SODIUM CHLORIDE 0.9% FLUSH
10.0000 mL | INTRAVENOUS | Status: DC | PRN
  Administered 2020-02-28: 15:00:00 10 mL
  Filled 2020-02-28: qty 10

## 2020-02-28 MED ORDER — SODIUM CHLORIDE 0.9% FLUSH
10.0000 mL | Freq: Once | INTRAVENOUS | Status: AC
  Administered 2020-02-28: 10 mL
  Filled 2020-02-28: qty 10

## 2020-02-28 MED ORDER — HEPARIN SOD (PORK) LOCK FLUSH 100 UNIT/ML IV SOLN
500.0000 [IU] | Freq: Once | INTRAVENOUS | Status: AC | PRN
  Administered 2020-02-28: 500 [IU]
  Filled 2020-02-28: qty 5

## 2020-02-28 MED ORDER — DEXTROSE 5 % IV SOLN
Freq: Once | INTRAVENOUS | Status: AC
  Filled 2020-02-28: qty 250

## 2020-02-28 MED ORDER — DOXORUBICIN HCL LIPOSOMAL CHEMO INJECTION 2 MG/ML
41.0000 mg/m2 | Freq: Once | INTRAVENOUS | Status: AC
  Administered 2020-02-28: 70 mg via INTRAVENOUS
  Filled 2020-02-28: qty 10

## 2020-02-28 MED ORDER — SODIUM CHLORIDE 0.9 % IV SOLN
10.0000 mg | Freq: Once | INTRAVENOUS | Status: AC
  Administered 2020-02-28: 10 mg via INTRAVENOUS
  Filled 2020-02-28: qty 10

## 2020-02-28 NOTE — Patient Instructions (Signed)

## 2020-02-28 NOTE — Assessment & Plan Note (Signed)
She is asking questions about the dose of her Lipitor I recommend she contact her primary care doctor for medical management We discussed briefly about dietary modification to help with her high cholesterol

## 2020-02-28 NOTE — Assessment & Plan Note (Signed)
Her blood pressure is mildly elevated today but her blood pressure monitor at home is satisfactory She will continue similar medication for now

## 2020-02-28 NOTE — Assessment & Plan Note (Signed)
She is to have stable pancytopenia We will continue with recent dose changes She is not symptomatic

## 2020-02-28 NOTE — Progress Notes (Signed)
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[facility]) She tolerated treatment well except for some mucositis as well as mild pancytopenia We will continue treatment as scheduled I recommend minimum 3 cycles of treatment before repeat imaging study  Elevated cholesterol She is asking questions about the dose of her Lipitor I recommend she contact her primary care doctor for medical management We discussed briefly about dietary modification to help with her high cholesterol  Essential hypertension Her blood pressure is mildly elevated today but her blood pressure monitor at home is satisfactory She will continue similar medication for now  Pancytopenia, acquired ([place]) She is to have stable pancytopenia We will continue with recent dose changes She is not symptomatic   No orders of the defined types were placed in this encounter.   All questions were answered. The patient knows to call the clinic with any problems, questions or concerns. The total time spent in the appointment was 20 minutes encounter with patients including review of chart and various tests results, discussions about plan of care and coordination of care plan   Heath Lark, MD 02/28/2020 1:04 PM  INTERVAL HISTORY: Please see below for problem oriented charting. She returns for cycle 2 of treatment She also sent me a long detailed my chart message about some of her symptoms She has some mucositis Her blood pressure is mildly elevated She has some concerns about Covid vaccine, her Lipitor prescription and others Overall, she tolerated treatment fairly well No recent infection, fever or chills  SUMMARY OF ONCOLOGIC HISTORY: Oncology History Overview Note  Neg genetics. Additional tests revealed ER: 80%, PR 3% MSI stable Progressed on Avastin and Taxol   Right ovarian epithelial  cancer ([place])  07/01/2015 Initial Diagnosis   She presented with postmenopausal bleeding   07/02/2015 Imaging   US pelvis showed bilateral ovarian cyst   07/13/2015 Imaging   5.2 cm left adnexal cystic lesion, with indeterminate but probably benign characteristics. In a postmenopausal female, consider continued annual imaging followup with CT or MRI versus surgical evaluation.  Colonic diverticulosis. No radiographic evidence of diverticulitis.  Cholelithiasis.  No radiographic evidence of cholecystitis.  Small hiatal hernia.   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  The tumor involves serosal surface of the right ovary and adjacent right fallopian tube.  Cervix and endometrium were within normal limits. ER positive Peritoneal washing was positive.   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   08/13/2015 Tumor Marker   Patient's tumor was tested for the following markers: CA-125 Results of the tumor marker test revealed 96   08/25/2015 - 12/08/2015 Chemotherapy   The patient had 6 cycles of carboplatin and taxol   09/07/2015 Tumor Marker   Patient's tumor was tested for the following markers: CA-125 Results of the tumor marker test revealed 51   10/05/2015 Tumor Marker   Patient's tumor was tested for the following markers: CA-125 Results of the tumor marker test revealed 29   11/09/2015 Tumor Marker   Patient's tumor was tested for the following markers: CA-125 Results of the tumor marker test revealed 44   12/08/2015 Tumor Marker  Patient's tumor was tested for the following markers: CA-125 Results of the tumor marker test revealed 30   12/15/2015 Genetic Testing    Genetics testing normal by [facility] Breast Ovarian panel 12-15-15   01/11/2016 Imaging   1. The only finding of note are several adjacent peripheral abnormal hypodensities along the anterior superior spleen margin, differential diagnostic considerations including small peripheral interval splenic infarcts, splenic injury with subcapsular a fluid collections, or less likely metastatic disease to the splenic margin. This likely merits observation. 2. Other imaging findings of potential clinical significance: Small type 1 hiatal hernia. Aortoiliac atherosclerotic vascular disease. Lower lumbar spondylosis and degenerative disc disease. Gallstones with potential mild gallbladder wall thickening.   03/07/2016 Tumor Marker   Patient's tumor was tested for the following markers: CA-125 Results of the tumor marker test revealed 24.2   04/06/2016 Imaging   1. No evidence of a ovarian cancer metastasis. 2. No ascites. 3. Post hysterectomy and oophorectomy. 4. Cholelithiasis with several large gallstones   04/06/2016 Tumor Marker   Patient's tumor was tested for the following markers: CA-125 Results of the tumor marker test revealed 22.8   05/30/2016 Tumor Marker   Patient's tumor was tested for the following markers: CA-125 Results of the tumor marker test revealed 21   09/07/2016 Tumor Marker   Patient's tumor was tested for the following markers: CA-125 Results of the tumor marker test revealed 22.4   11/28/2016 Tumor Marker   Patient's tumor was tested for the following markers: CA-125 Results of the tumor marker test revealed 18.8   02/23/2017 Tumor Marker   Patient's tumor was tested for the following markers: CA-125 Results of the tumor marker test revealed 19.1   06/02/2017 Tumor Marker   Patient's tumor was tested for the following markers: CA-125 Results of the tumor marker test revealed 18.3   08/24/2017 Mammogram   Pt reports mammogram complete   08/28/2017 Tumor Marker   Patient's  tumor was tested for the following markers: CA-125 Results of the tumor marker test revealed 21.1   12/01/2017 Tumor Marker   Patient's tumor was tested for the following markers: CA-125 Results of the tumor marker test revealed 31.2   12/13/2017 Imaging   New 2.7 cm peritoneal soft tissue mass in anterior left lower quadrant, consistent with metastatic disease.  No other sites of metastatic disease identified.  Incidental findings including: Cholelithiasis. Colonic diverticulosis. Tiny hiatal hernia. Aortic atherosclerosis.   01/09/2018 - 05/07/2018 Chemotherapy   The patient had carboplatin and taxol; After 2nd cycle, she developed carboplatin allergy. She received carboplatin desensitization protocol at [facility] for final 4 cycles, completed by 6/17. Avastin was added from 04/16/18 onwards   01/30/2018 Adverse Reaction   Potential carboplatin allergy is suspected. She received half the dose of prescribed carboplatin on cycle 2   05/31/2018 Tumor Marker   Patient's tumor was tested for the following markers: CA-125 Results of the tumor marker test revealed 26   05/31/2018 Imaging   1. Peritoneal soft tissue nodule identified on the previous study has decreased in the interval the. No progressive findings in the abdomen or pelvis on today's study to suggest disease progression. 2. Tiny pulmonary nodules, likely benign. Attention on follow-up recommended. 3. Cholelithiasis. 4.  Aortic Atherosclerois (ICD10-170.0) 5. Tiny hiatal hernia.     06/04/2018 - 07/30/2019 Chemotherapy   The patient is placed on Avastin for maintenance   06/25/2018 Tumor Marker   Patient's tumor was tested for the following markers: CA-125 Results of the tumor marker  test revealed 22.5   08/06/2018 Tumor Marker   Patient's tumor was tested for the following markers: CA-125 Results of the tumor marker test revealed 20.7   09/14/2018 Imaging   Status post hysterectomy and bilateral salpingo-oophorectomy.  Stable  soft tissue nodule beneath the left lower anterior abdominal wall, likely reflecting stable peritoneal disease.  Scattered small subpleural nodules in the lungs bilaterally, measuring up to 3 mm, technically indeterminate although likely benign. Please note that Fleischner Society guidelines do not apply. Attention on follow-up is suggested.  No evidence of new/progressive metastatic disease.   09/14/2018 Tumor Marker   Patient's tumor was tested for the following markers: CA-125 Results of the tumor marker test revealed 21.3   10/30/2018 Tumor Marker   Patient's tumor was tested for the following markers: CA-125 Results of the tumor marker test revealed 20.8   12/12/2018 Tumor Marker   Patient's tumor was tested for the following markers: CA-125 Results of the tumor marker test revealed 19.7   01/01/2019 Tumor Marker   Patient's tumor was tested for the following markers: CA-125 Results of the tumor marker test revealed 21.6   01/22/2019 Tumor Marker   Patient's tumor was tested for the following markers: CA-125 Results of the tumor marker test revealed 21.3   02/12/2019 Tumor Marker   Patient's tumor was tested for the following markers: CA-125 Results of the tumor marker test revealed 21.6   04/16/2019 Tumor Marker   Patient's tumor was tested for the following markers: CA-125 Results of the tumor marker test revealed 21.7   05/07/2019 Tumor Marker   Patient's tumor was tested for the following markers: CA-125 Results of the tumor marker test revealed 27.3   05/28/2019 Tumor Marker   Patient's tumor was tested for the following markers: CA-125 Results of the tumor marker test revealed 24.5   07/09/2019 - 08/19/2019 Chemotherapy   The patient had bevacizumab for chemotherapy treatment.     07/09/2019 Tumor Marker   Patient's tumor was tested for the following markers: CA-125 Results of the tumor marker test revealed 23.3.   08/06/2019 Imaging   Ct abdomen and pelvis 1. New  hypodense lesions of the left lobe of the liver measuring 2.5 x 2.5 cm (series 2, image 16) and right lobe of the liver measuring 1.1 x 1.1 cm (series 2, image 23), concerning for metastatic disease.   2. Interval enlargement of an irregular nodule of the omentum measuring 1.0 x 1.0 cm, previously 1.0 x 0.5 cm (series 2, image 51), concerning for peritoneal disease.   3.  Status post hysterectomy.   4. Other chronic and incidental findings as detailed above. Aortic Atherosclerosis (ICD10-I70.0).   08/16/2019 - 01/17/2020 Chemotherapy   The patient had weekly Taxol for chemotherapy treatment.     08/23/2019 Tumor Marker   Patient's tumor was tested for the following markers: CA-125 Results of the tumor marker test revealed 33.7.   09/06/2019 Tumor Marker   Patient's tumor was tested for the following markers: CA-125 Results of the tumor marker test revealed 32.2   10/14/2019 Imaging   1. Interval decrease in hypodense masses of the left lobe and right lobe of the liver, mass in the left lobe measuring 1.7 x 1.6 cm, previously 2.5 x 2.5 cm (series 2, image 14) and in the right lobe of the liver measuring 0.8 x 0.7 cm, previously 1.1 x 1.1 cm (series 2, image 22).   2. Interval decrease in size of an omental nodule, measuring 0.8 x 0.7 cm,  previously 1.0 x 1.0 cm (series 2, image 55).   3. Findings are consistent with improved metastatic disease. No new evidence of metastatic disease in the abdomen or pelvis.   4.  Status post hysterectomy.   5.  Cholelithiasis.   6.  Aortic Atherosclerosis (ICD10-I70.0).   01/23/2020 Imaging   1. 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 2. Slight increase in size of left lower quadrant omental soft tissue nodule, consistent with metastatic disease. 3. No new sites of metastatic disease identified. 4. Stable cholelithiasis and gallbladder wall thickening. No evidence of acute cholecystitis. 5. Colonic diverticulosis. No radiographic  evidence of diverticulitis. 6. Tiny hiatal hernia.   01/28/2020 Echocardiogram    1. Left ventricular ejection fraction, by estimation, is 60 to 65%. The left ventricle has normal function. The left ventricle has no regional wall motion abnormalities. Left ventricular diastolic parameters are consistent with Grade I diastolic dysfunction (impaired relaxation). The average left ventricular global longitudinal strain is -19.7 % (normal), however this may be underestimated given suboptimal tracking.  2. Right ventricular systolic function is normal. The right ventricular size is normal.  3. The mitral valve is normal in structure. Mild mitral valve regurgitation. No evidence of mitral stenosis.  4. The aortic valve is normal in structure. Aortic valve regurgitation is not visualized. No aortic stenosis is present.  5. The inferior vena cava is normal in size with greater than 50% respiratory variability, suggesting right atrial pressure of 3 mmHg.   Conclusion(s)/Recommendation(s): Normal biventricular function without evidence of hemodynamically significant valvular heart disease   01/31/2020 -  Chemotherapy   The patient had DOXOrubicin HCL LIPOSOMAL (DOXIL) 68 mg in dextrose 5 % 250 mL chemo infusion, 40 mg/m2 = 68 mg, Intravenous,  Once, 2 of 6 cycles Administration: 68 mg (01/31/2020)  for chemotherapy treatment.      REVIEW OF SYSTEMS:   Constitutional: Denies fevers, chills or abnormal weight loss Eyes: Denies blurriness of vision Respiratory: Denies cough, dyspnea or wheezes Cardiovascular: Denies palpitation, chest discomfort or lower extremity swelling Gastrointestinal:  Denies nausea, heartburn or change in bowel habits Skin: Denies abnormal skin rashes Lymphatics: Denies new lymphadenopathy or easy bruising Neurological:Denies numbness, tingling or new weaknesses Behavioral/Psych: Mood is stable, no new changes  All other systems were reviewed with the patient and are negative.  I  have reviewed the past medical history, past surgical history, social history and family history with the patient and they are unchanged from previous note.  ALLERGIES:  is allergic to carboplatin.  MEDICATIONS:  Current Outpatient Medications  Medication Sig Dispense Refill  . amLODipine (NORVASC) 10 MG tablet Take 1 tablet (10 mg total) by mouth daily. 90 tablet 3  . atenolol (TENORMIN) 25 MG tablet Take 1 tablet (25 mg total) by mouth daily. 30 tablet 9  . atorvastatin (LIPITOR) 10 MG tablet Take 1 tablet (10 mg total) by mouth daily. 30 tablet 2  . Coenzyme Q10 (CO Q 10) 100 MG CAPS Take 1 capsule by mouth daily.     . Glucosamine-Chondroit-Vit C-Mn (GLUCOSAMINE 1500 COMPLEX PO) Take 1 capsule by mouth 2 (two) times daily.    [name] lidocaine-prilocaine (EMLA) cream Apply to affected area once (Patient not taking: Reported on 11/14/2019) 30 g 3  . loratadine (CLARITIN) 10 MG tablet Take 10 mg by mouth daily as needed for allergies (hives).    . Multiple Vitamin (MULTIVITAMIN) capsule Take 1 capsule by mouth daily.     . Omega-3 Fatty Acids (OMEGA-3 FISH  OIL) 300 MG CAPS Take 1 capsule by mouth daily.     . ondansetron (ZOFRAN) 8 MG tablet Take 1 tablet (8 mg total) by mouth every 8 (eight) hours as needed for refractory nausea / vomiting. 30 tablet 1  . Polyethyl Glycol-Propyl Glycol (SYSTANE ULTRA OP) Apply 1 drop to eye 2 (two) times daily at 10 AM and 5 PM.    . Probiotic Product (PROBIOTIC ADVANCED PO) Take 1 capsule by mouth daily.    . prochlorperazine (COMPAZINE) 10 MG tablet Take 1 tablet (10 mg total) by mouth every 6 (six) hours as needed (Nausea or vomiting). 60 tablet 1  . vitamin E 400 UNIT capsule Take 400 Units by mouth every evening.      No current facility-administered medications for this visit.   Facility-Administered Medications Ordered in Other Visits  Medication Dose Route Frequency Provider Last Rate Last Admin  . dexamethasone (DECADRON) 10 mg in sodium chloride 0.9  % 50 mL IVPB  10 mg Intravenous Once Heath Lark, MD 204 mL/hr at 02/28/20 1303 10 mg at 02/28/20 1303  . DOXOrubicin HCL LIPOSOMAL (DOXIL) 68 mg in dextrose 5 % 250 mL chemo infusion  40 mg/m2 (Treatment Plan Recorded) Intravenous Once Alvy Bimler, Danuel Felicetti, MD      . heparin lock flush 100 unit/mL  500 Units Intracatheter Once PRN Alvy Bimler, Ashan Cueva, MD      . sodium chloride flush (NS) 0.9 % injection 10 mL  10 mL Intracatheter PRN Alvy Bimler, Vinicio Lynk, MD        PHYSICAL EXAMINATION: ECOG PERFORMANCE STATUS: 1 - Symptomatic but completely ambulatory  Vitals:   02/28/20 1220  BP: (!) 153/55  Pulse: 68  Resp: 18  Temp: 98.5 F (36.9 C)  SpO2: 100%   Filed Weights   02/28/20 1220  Weight: 144 lb 6.4 oz (65.5 kg)    GENERAL:alert, no distress and comfortable SKIN: skin color, texture, turgor are normal, no rashes or significant lesions EYES: normal, Conjunctiva are pink and non-injected, sclera clear OROPHARYNX:no exudate, no erythema and lips, buccal mucosa, and tongue normal  NECK: supple, thyroid normal size, non-tender, without nodularity LYMPH:  no palpable lymphadenopathy in the cervical, axillary or inguinal LUNGS: clear to auscultation and percussion with normal breathing effort HEART: regular rate & rhythm and no murmurs and no lower extremity edema ABDOMEN:abdomen soft, non-tender and normal bowel sounds Musculoskeletal:no cyanosis of digits and no clubbing  NEURO: alert & oriented x 3 with fluent speech, no focal motor/sensory deficits  LABORATORY DATA:  I have reviewed the data as listed    Component Value Date/Time   NA 139 02/28/2020 1155   NA 142 11/28/2016 0916   K 4.0 02/28/2020 1155   K 3.9 11/28/2016 0916   CL 108 02/28/2020 1155   CO2 24 02/28/2020 1155   CO2 28 11/28/2016 0916   GLUCOSE 96 02/28/2020 1155   GLUCOSE 78 11/28/2016 0916   BUN 17 02/28/2020 1155   BUN 16.8 11/28/2016 0916   CREATININE 0.79 02/28/2020 1155   CREATININE 1.15 (H) 05/28/2019 1345   CREATININE  0.8 11/28/2016 0916   CALCIUM 9.0 02/28/2020 1155   CALCIUM 10.5 (H) 11/28/2016 0916   PROT 6.4 (L) 02/28/2020 1155   PROT 7.3 11/28/2016 0916   ALBUMIN 3.5 02/28/2020 1155   ALBUMIN 4.3 11/28/2016 0916   AST 23 02/28/2020 1155   AST 23 05/28/2019 1345   AST 19 11/28/2016 0916   ALT 12 02/28/2020 1155   ALT 14 05/28/2019 1345   ALT  19 11/28/2016 0916   ALKPHOS 55 02/28/2020 1155   ALKPHOS 50 11/28/2016 0916   BILITOT 0.5 02/28/2020 1155   BILITOT 0.3 05/28/2019 1345   BILITOT 0.48 11/28/2016 0916   GFRNONAA >60 02/28/2020 1155   GFRNONAA 46 (L) 05/28/2019 1345   GFRAA >60 02/28/2020 1155   GFRAA 53 (L) 05/28/2019 1345    No results found for: SPEP, UPEP  Lab Results  Component Value Date   WBC 3.6 (L) 02/28/2020   NEUTROABS 1.5 (L) 02/28/2020   HGB 11.1 (L) 02/28/2020   HCT 34.2 (L) 02/28/2020   MCV 94.2 02/28/2020   PLT 124 (L) 02/28/2020      Chemistry      Component Value Date/Time   NA 139 02/28/2020 1155   NA 142 11/28/2016 0916   K 4.0 02/28/2020 1155   K 3.9 11/28/2016 0916   CL 108 02/28/2020 1155   CO2 24 02/28/2020 1155   CO2 28 11/28/2016 0916   BUN 17 02/28/2020 1155   BUN 16.8 11/28/2016 0916   CREATININE 0.79 02/28/2020 1155   CREATININE 1.15 (H) 05/28/2019 1345   CREATININE 0.8 11/28/2016 0916      Component Value Date/Time   CALCIUM 9.0 02/28/2020 1155   CALCIUM 10.5 (H) 11/28/2016 0916   ALKPHOS 55 02/28/2020 1155   ALKPHOS 50 11/28/2016 0916   AST 23 02/28/2020 1155   AST 23 05/28/2019 1345   AST 19 11/28/2016 0916   ALT 12 02/28/2020 1155   ALT 14 05/28/2019 1345   ALT 19 11/28/2016 0916   BILITOT 0.5 02/28/2020 1155   BILITOT 0.3 05/28/2019 1345   BILITOT 0.48 11/28/2016 0916

## 2020-02-28 NOTE — Patient Instructions (Signed)
[facility] Discharge Instructions for Patients Receiving Chemotherapy  Today you received the following chemotherapy agent: Liposomal Doxorubicin  To help prevent nausea and vomiting after your treatment, we encourage you to take your nausea medication as directed by your MD.   If you develop nausea and vomiting that is not controlled by your nausea medication, call the clinic.   BELOW ARE SYMPTOMS THAT SHOULD BE REPORTED IMMEDIATELY:  *FEVER GREATER THAN 100.5 F  *CHILLS WITH OR WITHOUT FEVER  NAUSEA AND VOMITING THAT IS NOT CONTROLLED WITH YOUR NAUSEA MEDICATION  *UNUSUAL SHORTNESS OF BREATH  *UNUSUAL BRUISING OR BLEEDING  TENDERNESS IN MOUTH AND THROAT WITH OR WITHOUT PRESENCE OF ULCERS  *URINARY PROBLEMS  *BOWEL PROBLEMS  UNUSUAL RASH Items with * indicate a potential emergency and should be followed up as soon as possible.  Feel free to call the clinic should you have any questions or concerns. The clinic phone number is [phone].  Please show the [facility] at check-in to the Emergency Department and triage nurse.  Coronavirus (COVID-19) Are you at risk?  Are you at risk for the Coronavirus (COVID-19)?  To be considered HIGH RISK for Coronavirus (COVID-19), you have to meet the following criteria:  . Traveled to [place], [place], [place], [place] or [place]; or in the [place] to [place], [place], [place], or [place]; and have fever, cough, and shortness of breath within the last 2 weeks of travel OR . Been in close contact with a person diagnosed with COVID-19 within the last 2 weeks and have fever, cough, and shortness of breath . IF YOU DO NOT MEET THESE CRITERIA, YOU ARE CONSIDERED LOW RISK FOR COVID-19.  What to do if you are HIGH RISK for COVID-19?  [name] If you are having a medical emergency, call 911. . Seek medical care right away. Before you go to a doctor's office, urgent care or emergency department, call ahead and  tell them about your recent travel, contact with someone diagnosed with COVID-19, and your symptoms. You should receive instructions from your physician's office regarding next steps of care.  . When you arrive at healthcare provider, tell the healthcare staff immediately you have returned from visiting [place], [place], [place], [place] or [place]; or traveled in the [place] to [place], [place], [place], or [place]; in the last two weeks or you have been in close contact with a person diagnosed with COVID-19 in the last 2 weeks.   . Tell the health care staff about your symptoms: fever, cough and shortness of breath. . After you have been seen by a medical provider, you will be either: o Tested for (COVID-19) and discharged home on quarantine except to seek medical care if symptoms worsen, and asked to  - Stay home and avoid contact with others until you get your results (4-5 days)  - Avoid travel on public transportation if possible (such as bus, train, or airplane) or o Sent to the Emergency Department by EMS for evaluation, COVID-19 testing, and possible admission depending on your condition and test results.  What to do if you are LOW RISK for COVID-19?  Reduce your risk of any infection by using the same precautions used for avoiding the common cold or flu:  [name] Wash your hands often with soap and warm water for at least 20 seconds.  If soap and water are not readily available, use an alcohol-based hand sanitizer with at least 60% alcohol.  [name]  If coughing or sneezing, cover your mouth and nose by coughing or sneezing into the elbow areas of your shirt or coat, into a tissue or into your sleeve (not your hands). . Avoid shaking hands with others and consider head nods or verbal greetings only. . Avoid touching your eyes, nose, or mouth with unwashed hands.  . Avoid close contact with people who are sick. . Avoid places or events with large numbers of people in one location, like  concerts or sporting events. . Carefully consider travel plans you have or are making. . If you are planning any travel outside or inside the [place], visit the CDC's Travelers' Health webpage for the latest health notices. . If you have some symptoms but not all symptoms, continue to monitor at home and seek medical attention if your symptoms worsen. . If you are having a medical emergency, call 911.   ADDITIONAL HEALTHCARE OPTIONS FOR PATIENTS  [facility] / e-Visit: [email or web address]         [facility]: [phone]  [facility]: [phone]                   [facility]: [phone]  

## 2020-02-28 NOTE — Assessment & Plan Note (Signed)
She tolerated treatment well except for some mucositis as well as mild pancytopenia We will continue treatment as scheduled I recommend minimum 3 cycles of treatment before repeat imaging study

## 2020-03-23 NOTE — Progress Notes (Signed)
Pharmacist Chemotherapy Monitoring - Follow Up Assessment    I verify that I have reviewed each item in the below checklist:  . Regimen for the patient is scheduled for the appropriate day and plan matches scheduled date. [name] Appropriate non-routine labs are ordered dependent on drug ordered. . If applicable, additional medications reviewed and ordered per protocol based on lifetime cumulative doses and/or treatment regimen.   Plan for follow-up and/or issues identified: No . I-vent associated with next due treatment: No . MD and/or nursing notified: No  Emily Hull 03/23/2020 10:44 AM

## 2020-03-26 ENCOUNTER — Encounter: Payer: Self-pay | Admitting: Hematology and Oncology

## 2020-03-27 ENCOUNTER — Inpatient Hospital Stay: Payer: Medicare PPO

## 2020-03-27 ENCOUNTER — Other Ambulatory Visit: Payer: Self-pay

## 2020-03-27 ENCOUNTER — Inpatient Hospital Stay: Payer: Medicare PPO | Admitting: Hematology and Oncology

## 2020-03-27 ENCOUNTER — Telehealth: Payer: Self-pay | Admitting: Hematology and Oncology

## 2020-03-27 ENCOUNTER — Encounter: Payer: Self-pay | Admitting: Hematology and Oncology

## 2020-03-27 ENCOUNTER — Inpatient Hospital Stay: Payer: Medicare PPO | Attending: Gynecology

## 2020-03-27 VITALS — BP 138/57 | HR 62 | Temp 97.8°F | Resp 18 | Ht 62.5 in | Wt 142.2 lb

## 2020-03-27 DIAGNOSIS — Z5111 Encounter for antineoplastic chemotherapy: Secondary | ICD-10-CM | POA: Insufficient documentation

## 2020-03-27 DIAGNOSIS — L271 Localized skin eruption due to drugs and medicaments taken internally: Secondary | ICD-10-CM | POA: Diagnosis not present

## 2020-03-27 DIAGNOSIS — C787 Secondary malignant neoplasm of liver and intrahepatic bile duct: Secondary | ICD-10-CM | POA: Insufficient documentation

## 2020-03-27 DIAGNOSIS — Z7189 Other specified counseling: Secondary | ICD-10-CM

## 2020-03-27 DIAGNOSIS — C561 Malignant neoplasm of right ovary: Secondary | ICD-10-CM

## 2020-03-27 DIAGNOSIS — D61818 Other pancytopenia: Secondary | ICD-10-CM | POA: Diagnosis not present

## 2020-03-27 LAB — CBC WITH DIFFERENTIAL/PLATELET
Abs Immature Granulocytes: 0.01 10*3/uL (ref 0.00–0.07)
Basophils Absolute: 0.1 10*3/uL (ref 0.0–0.1)
Basophils Relative: 2 %
Eosinophils Absolute: 0 10*3/uL (ref 0.0–0.5)
Eosinophils Relative: 2 %
HCT: 31.1 % — ABNORMAL LOW (ref 36.0–46.0)
Hemoglobin: 10.3 g/dL — ABNORMAL LOW (ref 12.0–15.0)
Immature Granulocytes: 0 %
Lymphocytes Relative: 31 %
Lymphs Abs: 0.8 10*3/uL (ref 0.7–4.0)
MCH: 30.3 pg (ref 26.0–34.0)
MCHC: 33.1 g/dL (ref 30.0–36.0)
MCV: 91.5 fL (ref 80.0–100.0)
Monocytes Absolute: 0.6 10*3/uL (ref 0.1–1.0)
Monocytes Relative: 24 %
Neutro Abs: 1.1 10*3/uL — ABNORMAL LOW (ref 1.7–7.7)
Neutrophils Relative %: 41 %
Platelets: 117 10*3/uL — ABNORMAL LOW (ref 150–400)
RBC: 3.4 MIL/uL — ABNORMAL LOW (ref 3.87–5.11)
RDW: 16.1 % — ABNORMAL HIGH (ref 11.5–15.5)
WBC: 2.6 10*3/uL — ABNORMAL LOW (ref 4.0–10.5)
nRBC: 0 % (ref 0.0–0.2)

## 2020-03-27 LAB — COMPREHENSIVE METABOLIC PANEL
ALT: 17 U/L (ref 0–44)
AST: 31 U/L (ref 15–41)
Albumin: 3.4 g/dL — ABNORMAL LOW (ref 3.5–5.0)
Alkaline Phosphatase: 54 U/L (ref 38–126)
Anion gap: 9 (ref 5–15)
BUN: 17 mg/dL (ref 8–23)
CO2: 24 mmol/L (ref 22–32)
Calcium: 9.3 mg/dL (ref 8.9–10.3)
Chloride: 109 mmol/L (ref 98–111)
Creatinine, Ser: 0.78 mg/dL (ref 0.44–1.00)
GFR calc Af Amer: >60 mL/min (ref >60)
GFR calc non Af Amer: >60 mL/min (ref >60)
Glucose, Bld: 98 mg/dL (ref 70–99)
Potassium: 3.8 mmol/L (ref 3.5–5.1)
Sodium: 142 mmol/L (ref 135–145)
Total Bilirubin: 0.4 mg/dL (ref 0.3–1.2)
Total Protein: 6.2 g/dL — ABNORMAL LOW (ref 6.5–8.1)

## 2020-03-27 MED ORDER — HEPARIN SOD (PORK) LOCK FLUSH 100 UNIT/ML IV SOLN
500.0000 [IU] | Freq: Once | INTRAVENOUS | Status: AC | PRN
  Administered 2020-03-27: 13:00:00 500 [IU]
  Filled 2020-03-27: qty 5

## 2020-03-27 MED ORDER — SODIUM CHLORIDE 0.9% FLUSH
10.0000 mL | Freq: Once | INTRAVENOUS | Status: AC
  Administered 2020-03-27: 10 mL
  Filled 2020-03-27: qty 10

## 2020-03-27 MED ORDER — DOXORUBICIN HCL LIPOSOMAL CHEMO INJECTION 2 MG/ML
30.0000 mg/m2 | Freq: Once | INTRAVENOUS | Status: AC
  Administered 2020-03-27: 50 mg via INTRAVENOUS
  Filled 2020-03-27: qty 25

## 2020-03-27 MED ORDER — SODIUM CHLORIDE 0.9% FLUSH
10.0000 mL | INTRAVENOUS | Status: DC | PRN
  Administered 2020-03-27: 13:00:00 10 mL
  Filled 2020-03-27: qty 10

## 2020-03-27 MED ORDER — DEXTROSE 5 % IV SOLN
Freq: Once | INTRAVENOUS | Status: AC
  Filled 2020-03-27: qty 250

## 2020-03-27 MED ORDER — SODIUM CHLORIDE 0.9 % IV SOLN
10.0000 mg | Freq: Once | INTRAVENOUS | Status: AC
  Administered 2020-03-27: 10 mg via INTRAVENOUS
  Filled 2020-03-27: qty 10

## 2020-03-27 NOTE — Progress Notes (Signed)
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 CT ABDOMEN PELVIS W CONTRAST    Standing Status:   Future    Standing Expiration Date:   03/27/2021    Order Specific Question:   If indicated for the ordered procedure, I authorize the administration of contrast media per Radiology protocol    Answer:   Yes    Order Specific Question:   Preferred imaging location?    Answer:   [facility]    Order Specific Question:   Radiology Contrast Protocol - do NOT remove file path    Answer:   [email or web address]    All questions were answered. The patient knows to call the clinic with any problems, questions or concerns. The total time spent in the appointment was 30 minutes encounter with patients including review of chart and various tests results, discussions about plan of care and coordination of care plan   Heath Lark, MD 03/27/2020 11:51 AM  INTERVAL HISTORY: Please see below for problem oriented charting. She is seen prior to cycle 3 of  treatment She is doing well Her appetite is fair She denies mucositis or nausea No recent constipation She has some shortness of breath on moderate exertion but no chest pain or chest discomfort She has noticed some skin changes on her hands and feet but no blisters or bleeding No recent infection, fever or chills  SUMMARY OF ONCOLOGIC HISTORY: Oncology History Overview Note  Neg genetics. Additional tests revealed ER: 80%, PR 3% MSI stable Progressed on Avastin and Taxol   Right ovarian epithelial cancer ([place])  07/01/2015 Initial Diagnosis   She presented with postmenopausal bleeding   07/02/2015 Imaging   US pelvis showed bilateral ovarian cyst   07/13/2015 Imaging   5.2 cm left adnexal cystic lesion, with indeterminate but probably benign characteristics. In a postmenopausal female, consider continued annual imaging followup with CT or MRI versus surgical evaluation.  Colonic diverticulosis. No radiographic evidence of diverticulitis.  Cholelithiasis.  No radiographic evidence of cholecystitis.  Small hiatal hernia.   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  The tumor involves serosal surface of the right ovary and adjacent right fallopian tube.  Cervix and endometrium were within normal limits. ER positive Peritoneal washing was positive.   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   08/13/2015 Tumor Marker   Patient's tumor was tested for the following  markers: CA-125 Results of the tumor marker test revealed 96   08/25/2015 - 12/08/2015 Chemotherapy   The patient had 6 cycles of carboplatin and taxol   09/07/2015 Tumor Marker   Patient's tumor was tested for  the following markers: CA-125 Results of the tumor marker test revealed 51   10/05/2015 Tumor Marker   Patient's tumor was tested for the following markers: CA-125 Results of the tumor marker test revealed 29   11/09/2015 Tumor Marker   Patient's tumor was tested for the following markers: CA-125 Results of the tumor marker test revealed 44   12/08/2015 Tumor Marker   Patient's tumor was tested for the following markers: CA-125 Results of the tumor marker test revealed 30   12/15/2015 Genetic Testing   Genetics testing normal by [facility] Breast Ovarian panel 12-15-15   01/11/2016 Imaging   1. The only finding of note are several adjacent peripheral abnormal hypodensities along the anterior superior spleen margin, differential diagnostic considerations including small peripheral interval splenic infarcts, splenic injury with subcapsular a fluid collections, or less likely metastatic disease to the splenic margin. This likely merits observation. 2. Other imaging findings of potential clinical significance: Small type 1 hiatal hernia. Aortoiliac atherosclerotic vascular disease. Lower lumbar spondylosis and degenerative disc disease. Gallstones with potential mild gallbladder wall thickening.   03/07/2016 Tumor Marker   Patient's tumor was tested for the following markers: CA-125 Results of the tumor marker test revealed 24.2   04/06/2016 Imaging   1. No evidence of a ovarian cancer metastasis. 2. No ascites. 3. Post hysterectomy and oophorectomy. 4. Cholelithiasis with several large gallstones   04/06/2016 Tumor Marker   Patient's tumor was tested for the following markers: CA-125 Results of the tumor marker test revealed 22.8   05/30/2016 Tumor Marker   Patient's tumor was tested for the following markers: CA-125 Results of the tumor marker test revealed 21   09/07/2016 Tumor Marker   Patient's tumor was tested for the following markers: CA-125 Results of the tumor marker test revealed  22.4   11/28/2016 Tumor Marker   Patient's tumor was tested for the following markers: CA-125 Results of the tumor marker test revealed 18.8   02/23/2017 Tumor Marker   Patient's tumor was tested for the following markers: CA-125 Results of the tumor marker test revealed 19.1   06/02/2017 Tumor Marker   Patient's tumor was tested for the following markers: CA-125 Results of the tumor marker test revealed 18.3   08/24/2017 Mammogram   Pt reports mammogram complete   08/28/2017 Tumor Marker   Patient's tumor was tested for the following markers: CA-125 Results of the tumor marker test revealed 21.1   12/01/2017 Tumor Marker   Patient's tumor was tested for the following markers: CA-125 Results of the tumor marker test revealed 31.2   12/13/2017 Imaging   New 2.7 cm peritoneal soft tissue mass in anterior left lower quadrant, consistent with metastatic disease.  No other sites of metastatic disease identified.  Incidental findings including: Cholelithiasis. Colonic diverticulosis. Tiny hiatal hernia. Aortic atherosclerosis.   01/09/2018 - 05/07/2018 Chemotherapy   The patient had carboplatin and taxol; After 2nd cycle, she developed carboplatin allergy. She received carboplatin desensitization protocol at [facility] for final 4 cycles, completed by 6/17. Avastin was added from 04/16/18 onwards   01/30/2018 Adverse Reaction   Potential carboplatin allergy is suspected. She received half the dose of prescribed carboplatin on cycle 2   05/31/2018 Tumor Marker   Patient's tumor was tested for the following markers:  CA-125 Results of the tumor marker test revealed 26   05/31/2018 Imaging   1. Peritoneal soft tissue nodule identified on the previous study has decreased in the interval the. No progressive findings in the abdomen or pelvis on today's study to suggest disease progression. 2. Tiny pulmonary nodules, likely benign. Attention on follow-up recommended. 3. Cholelithiasis. 4.  Aortic  Atherosclerois (ICD10-170.0) 5. Tiny hiatal hernia.     06/04/2018 - 07/30/2019 Chemotherapy   The patient is placed on Avastin for maintenance   06/25/2018 Tumor Marker   Patient's tumor was tested for the following markers: CA-125 Results of the tumor marker test revealed 22.5   08/06/2018 Tumor Marker   Patient's tumor was tested for the following markers: CA-125 Results of the tumor marker test revealed 20.7   09/14/2018 Imaging   Status post hysterectomy and bilateral salpingo-oophorectomy.  Stable soft tissue nodule beneath the left lower anterior abdominal wall, likely reflecting stable peritoneal disease.  Scattered small subpleural nodules in the lungs bilaterally, measuring up to 3 mm, technically indeterminate although likely benign. Please note that Fleischner Society guidelines do not apply. Attention on follow-up is suggested.  No evidence of new/progressive metastatic disease.   09/14/2018 Tumor Marker   Patient's tumor was tested for the following markers: CA-125 Results of the tumor marker test revealed 21.3   10/30/2018 Tumor Marker   Patient's tumor was tested for the following markers: CA-125 Results of the tumor marker test revealed 20.8   12/12/2018 Tumor Marker   Patient's tumor was tested for the following markers: CA-125 Results of the tumor marker test revealed 19.7   01/01/2019 Tumor Marker   Patient's tumor was tested for the following markers: CA-125 Results of the tumor marker test revealed 21.6   01/22/2019 Tumor Marker   Patient's tumor was tested for the following markers: CA-125 Results of the tumor marker test revealed 21.3   02/12/2019 Tumor Marker   Patient's tumor was tested for the following markers: CA-125 Results of the tumor marker test revealed 21.6   04/16/2019 Tumor Marker   Patient's tumor was tested for the following markers: CA-125 Results of the tumor marker test revealed 21.7   05/07/2019 Tumor Marker   Patient's tumor was  tested for the following markers: CA-125 Results of the tumor marker test revealed 27.3   05/28/2019 Tumor Marker   Patient's tumor was tested for the following markers: CA-125 Results of the tumor marker test revealed 24.5   07/09/2019 - 08/19/2019 Chemotherapy   The patient had bevacizumab for chemotherapy treatment.     07/09/2019 Tumor Marker   Patient's tumor was tested for the following markers: CA-125 Results of the tumor marker test revealed 23.3.   08/06/2019 Imaging   Ct abdomen and pelvis 1. New hypodense lesions of the left lobe of the liver measuring 2.5 x 2.5 cm (series 2, image 16) and right lobe of the liver measuring 1.1 x 1.1 cm (series 2, image 23), concerning for metastatic disease.   2. Interval enlargement of an irregular nodule of the omentum measuring 1.0 x 1.0 cm, previously 1.0 x 0.5 cm (series 2, image 51), concerning for peritoneal disease.   3.  Status post hysterectomy.   4. Other chronic and incidental findings as detailed above. Aortic Atherosclerosis (ICD10-I70.0).   08/16/2019 - 01/17/2020 Chemotherapy   The patient had weekly Taxol for chemotherapy treatment.     08/23/2019 Tumor Marker   Patient's tumor was tested for the following markers: CA-125 Results of the tumor marker  test revealed 33.7.   09/06/2019 Tumor Marker   Patient's tumor was tested for the following markers: CA-125 Results of the tumor marker test revealed 32.2   10/14/2019 Imaging   1. Interval decrease in hypodense masses of the left lobe and right lobe of the liver, mass in the left lobe measuring 1.7 x 1.6 cm, previously 2.5 x 2.5 cm (series 2, image 14) and in the right lobe of the liver measuring 0.8 x 0.7 cm, previously 1.1 x 1.1 cm (series 2, image 22).   2. Interval decrease in size of an omental nodule, measuring 0.8 x 0.7 cm, previously 1.0 x 1.0 cm (series 2, image 55).   3. Findings are consistent with improved metastatic disease. No new evidence of metastatic disease  in the abdomen or pelvis.   4.  Status post hysterectomy.   5.  Cholelithiasis.   6.  Aortic Atherosclerosis (ICD10-I70.0).   01/23/2020 Imaging   1. 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 2. Slight increase in size of left lower quadrant omental soft tissue nodule, consistent with metastatic disease. 3. No new sites of metastatic disease identified. 4. Stable cholelithiasis and gallbladder wall thickening. No evidence of acute cholecystitis. 5. Colonic diverticulosis. No radiographic evidence of diverticulitis. 6. Tiny hiatal hernia.   01/28/2020 Echocardiogram    1. Left ventricular ejection fraction, by estimation, is 60 to 65%. The left ventricle has normal function. The left ventricle has no regional wall motion abnormalities. Left ventricular diastolic parameters are consistent with Grade I diastolic dysfunction (impaired relaxation). The average left ventricular global longitudinal strain is -19.7 % (normal), however this may be underestimated given suboptimal tracking.  2. Right ventricular systolic function is normal. The right ventricular size is normal.  3. The mitral valve is normal in structure. Mild mitral valve regurgitation. No evidence of mitral stenosis.  4. The aortic valve is normal in structure. Aortic valve regurgitation is not visualized. No aortic stenosis is present.  5. The inferior vena cava is normal in size with greater than 50% respiratory variability, suggesting right atrial pressure of 3 mmHg.   Conclusion(s)/Recommendation(s): Normal biventricular function without evidence of hemodynamically significant valvular heart disease   01/31/2020 -  Chemotherapy   The patient had DOXOrubicin HCL LIPOSOMAL (DOXIL) 68 mg in dextrose 5 % 250 mL chemo infusion, 40 mg/m2 = 68 mg, Intravenous,  Once, 3 of 6 cycles Dose modification: 32 mg/m2 (80 % of original dose 40 mg/m2, Cycle 3, Reason: Dose Not Tolerated) Administration: 68 mg (01/31/2020), 70 mg  (02/28/2020)  for chemotherapy treatment.      REVIEW OF SYSTEMS:   Constitutional: Denies fevers, chills or abnormal weight loss Eyes: Denies blurriness of vision Ears, nose, mouth, throat, and face: Denies mucositis or sore throat Respiratory: Denies cough, dyspnea or wheezes Cardiovascular: Denies palpitation, chest discomfort or lower extremity swelling Gastrointestinal:  Denies nausea, heartburn or change in bowel habits Lymphatics: Denies new lymphadenopathy or easy bruising Neurological:Denies numbness, tingling or new weaknesses Behavioral/Psych: Mood is stable, no new changes  All other systems were reviewed with the patient and are negative.  I have reviewed the past medical history, past surgical history, social history and family history with the patient and they are unchanged from previous note.  ALLERGIES:  is allergic to carboplatin.  MEDICATIONS:  Current Outpatient Medications  Medication Sig Dispense Refill  . amLODipine (NORVASC) 10 MG tablet Take 1 tablet (10 mg total) by mouth daily. 90 tablet 3  . atenolol (TENORMIN) 25  MG tablet Take 1 tablet (25 mg total) by mouth daily. 30 tablet 9  . atorvastatin (LIPITOR) 10 MG tablet Take 1 tablet (10 mg total) by mouth daily. 30 tablet 2  . Coenzyme Q10 (CO Q 10) 100 MG CAPS Take 1 capsule by mouth daily.     . Glucosamine-Chondroit-Vit C-Mn (GLUCOSAMINE 1500 COMPLEX PO) Take 1 capsule by mouth 2 (two) times daily.    [name] lidocaine-prilocaine (EMLA) cream Apply to affected area once (Patient not taking: Reported on 11/14/2019) 30 g 3  . loratadine (CLARITIN) 10 MG tablet Take 10 mg by mouth daily as needed for allergies (hives).    . Multiple Vitamin (MULTIVITAMIN) capsule Take 1 capsule by mouth daily.     . Omega-3 Fatty Acids (OMEGA-3 FISH OIL) 300 MG CAPS Take 1 capsule by mouth daily.     . ondansetron (ZOFRAN) 8 MG tablet Take 1 tablet (8 mg total) by mouth every 8 (eight) hours as needed for refractory nausea /  vomiting. 30 tablet 1  . Polyethyl Glycol-Propyl Glycol (SYSTANE ULTRA OP) Apply 1 drop to eye 2 (two) times daily at 10 AM and 5 PM.    . Probiotic Product (PROBIOTIC ADVANCED PO) Take 1 capsule by mouth daily.    . prochlorperazine (COMPAZINE) 10 MG tablet Take 1 tablet (10 mg total) by mouth every 6 (six) hours as needed (Nausea or vomiting). 60 tablet 1  . vitamin E 400 UNIT capsule Take 400 Units by mouth every evening.      No current facility-administered medications for this visit.   Facility-Administered Medications Ordered in Other Visits  Medication Dose Route Frequency Provider Last Rate Last Admin  . DOXOrubicin HCL LIPOSOMAL (DOXIL) 50 mg in dextrose 5 % 250 mL chemo infusion  30 mg/m2 (Treatment Plan Recorded) Intravenous Once Heath Lark, MD 275 mL/hr at 03/27/20 1131 50 mg at 03/27/20 1131  . heparin lock flush 100 unit/mL  500 Units Intracatheter Once PRN Alvy Bimler, Destyn Parfitt, MD      . sodium chloride flush (NS) 0.9 % injection 10 mL  10 mL Intracatheter PRN Alvy Bimler, Rafeal Skibicki, MD        PHYSICAL EXAMINATION: ECOG PERFORMANCE STATUS: 1 - Symptomatic but completely ambulatory  Vitals:   03/27/20 0947  BP: (!) 138/57  Pulse: 62  Resp: 18  Temp: 97.8 F (36.6 C)  SpO2: 100%   Filed Weights   03/27/20 0947  Weight: 142 lb 3.2 oz (64.5 kg)    GENERAL:alert, no distress and comfortable SKIN: Significant erythematous changes on her hands and feet consistent with hand-foot syndrome EYES: normal, Conjunctiva are pink and non-injected, sclera clear OROPHARYNX:no exudate, no erythema and lips, buccal mucosa, and tongue normal  NECK: supple, thyroid normal size, non-tender, without nodularity LYMPH:  no palpable lymphadenopathy in the cervical, axillary or inguinal LUNGS: clear to auscultation and percussion with normal breathing effort HEART: regular rate & rhythm and no murmurs and no lower extremity edema ABDOMEN:abdomen soft, non-tender and normal bowel sounds Musculoskeletal:no  cyanosis of digits and no clubbing  NEURO: alert & oriented x 3 with fluent speech, no focal motor/sensory deficits  LABORATORY DATA:  I have reviewed the data as listed    Component Value Date/Time   NA 142 03/27/2020 0919   NA 142 11/28/2016 0916   K 3.8 03/27/2020 0919   K 3.9 11/28/2016 0916   CL 109 03/27/2020 0919   CO2 24 03/27/2020 0919   CO2 28 11/28/2016 0916   GLUCOSE 98 03/27/2020 0919  GLUCOSE 78 11/28/2016 0916   BUN 17 03/27/2020 0919   BUN 16.8 11/28/2016 0916   CREATININE 0.78 03/27/2020 0919   CREATININE 1.15 (H) 05/28/2019 1345   CREATININE 0.8 11/28/2016 0916   CALCIUM 9.3 03/27/2020 0919   CALCIUM 10.5 (H) 11/28/2016 0916   PROT 6.2 (L) 03/27/2020 0919   PROT 7.3 11/28/2016 0916   ALBUMIN 3.4 (L) 03/27/2020 0919   ALBUMIN 4.3 11/28/2016 0916   AST 31 03/27/2020 0919   AST 23 05/28/2019 1345   AST 19 11/28/2016 0916   ALT 17 03/27/2020 0919   ALT 14 05/28/2019 1345   ALT 19 11/28/2016 0916   ALKPHOS 54 03/27/2020 0919   ALKPHOS 50 11/28/2016 0916   BILITOT 0.4 03/27/2020 0919   BILITOT 0.3 05/28/2019 1345   BILITOT 0.48 11/28/2016 0916   GFRNONAA >60 03/27/2020 0919   GFRNONAA 46 (L) 05/28/2019 1345   GFRAA >60 03/27/2020 0919   GFRAA 53 (L) 05/28/2019 1345    No results found for: SPEP, UPEP  Lab Results  Component Value Date   WBC 2.6 (L) 03/27/2020   NEUTROABS 1.1 (L) 03/27/2020   HGB 10.3 (L) 03/27/2020   HCT 31.1 (L) 03/27/2020   MCV 91.5 03/27/2020   PLT 117 (L) 03/27/2020      Chemistry      Component Value Date/Time   NA 142 03/27/2020 0919   NA 142 11/28/2016 0916   K 3.8 03/27/2020 0919   K 3.9 11/28/2016 0916   CL 109 03/27/2020 0919   CO2 24 03/27/2020 0919   CO2 28 11/28/2016 0916   BUN 17 03/27/2020 0919   BUN 16.8 11/28/2016 0916   CREATININE 0.78 03/27/2020 0919   CREATININE 1.15 (H) 05/28/2019 1345   CREATININE 0.8 11/28/2016 0916      Component Value Date/Time   CALCIUM 9.3 03/27/2020 0919   CALCIUM 10.5  (H) 11/28/2016 0916   ALKPHOS 54 03/27/2020 0919   ALKPHOS 50 11/28/2016 0916   AST 31 03/27/2020 0919   AST 23 05/28/2019 1345   AST 19 11/28/2016 0916   ALT 17 03/27/2020 0919   ALT 14 05/28/2019 1345   ALT 19 11/28/2016 0916   BILITOT 0.4 03/27/2020 0919   BILITOT 0.3 05/28/2019 1345   BILITOT 0.48 11/28/2016 0916

## 2020-03-27 NOTE — Assessment & Plan Note (Signed)
Overall, she is not symptomatic I recommend dose adjustment We discussed extensively about neutropenic precaution

## 2020-03-27 NOTE — Assessment & Plan Note (Signed)
She has significant palmar erythema, consistent with hand-foot syndrome There are no signs of blisters, skin break or bleeding We discussed the importance of heavy moisturizer Plan to reduce the dose of chemotherapy as above

## 2020-03-27 NOTE — Patient Instructions (Signed)
[facility] Discharge Instructions for Patients Receiving Chemotherapy  Today you received the following chemotherapy agent: Liposomal Doxorubicin  To help prevent nausea and vomiting after your treatment, we encourage you to take your nausea medication as directed by your MD.   If you develop nausea and vomiting that is not controlled by your nausea medication, call the clinic.   BELOW ARE SYMPTOMS THAT SHOULD BE REPORTED IMMEDIATELY:  *FEVER GREATER THAN 100.5 F  *CHILLS WITH OR WITHOUT FEVER  NAUSEA AND VOMITING THAT IS NOT CONTROLLED WITH YOUR NAUSEA MEDICATION  *UNUSUAL SHORTNESS OF BREATH  *UNUSUAL BRUISING OR BLEEDING  TENDERNESS IN MOUTH AND THROAT WITH OR WITHOUT PRESENCE OF ULCERS  *URINARY PROBLEMS  *BOWEL PROBLEMS  UNUSUAL RASH Items with * indicate a potential emergency and should be followed up as soon as possible.  Feel free to call the clinic should you have any questions or concerns. The clinic phone number is [phone].  Please show the [facility] at check-in to the Emergency Department and triage nurse.  Coronavirus (COVID-19) Are you at risk?  Are you at risk for the Coronavirus (COVID-19)?  To be considered HIGH RISK for Coronavirus (COVID-19), you have to meet the following criteria:  . Traveled to [place], [place], [place], [place] or [place]; or in the [place] to [place], [place], [place], or [place]; and have fever, cough, and shortness of breath within the last 2 weeks of travel OR . Been in close contact with a person diagnosed with COVID-19 within the last 2 weeks and have fever, cough, and shortness of breath . IF YOU DO NOT MEET THESE CRITERIA, YOU ARE CONSIDERED LOW RISK FOR COVID-19.  What to do if you are HIGH RISK for COVID-19?  [name] If you are having a medical emergency, call 911. . Seek medical care right away. Before you go to a doctor's office, urgent care or emergency department, call ahead and  tell them about your recent travel, contact with someone diagnosed with COVID-19, and your symptoms. You should receive instructions from your physician's office regarding next steps of care.  . When you arrive at healthcare provider, tell the healthcare staff immediately you have returned from visiting [place], [place], [place], [place] or [place]; or traveled in the [place] to [place], [place], [place], or [place]; in the last two weeks or you have been in close contact with a person diagnosed with COVID-19 in the last 2 weeks.   . Tell the health care staff about your symptoms: fever, cough and shortness of breath. . After you have been seen by a medical provider, you will be either: o Tested for (COVID-19) and discharged home on quarantine except to seek medical care if symptoms worsen, and asked to  - Stay home and avoid contact with others until you get your results (4-5 days)  - Avoid travel on public transportation if possible (such as bus, train, or airplane) or o Sent to the Emergency Department by EMS for evaluation, COVID-19 testing, and possible admission depending on your condition and test results.  What to do if you are LOW RISK for COVID-19?  Reduce your risk of any infection by using the same precautions used for avoiding the common cold or flu:  [name] Wash your hands often with soap and warm water for at least 20 seconds.  If soap and water are not readily available, use an alcohol-based hand sanitizer with at least 60% alcohol.  [name]  If coughing or sneezing, cover your mouth and nose by coughing or sneezing into the elbow areas of your shirt or coat, into a tissue or into your sleeve (not your hands). . Avoid shaking hands with others and consider head nods or verbal greetings only. . Avoid touching your eyes, nose, or mouth with unwashed hands.  . Avoid close contact with people who are sick. . Avoid places or events with large numbers of people in one location, like  concerts or sporting events. . Carefully consider travel plans you have or are making. . If you are planning any travel outside or inside the [place], visit the CDC's Travelers' Health webpage for the latest health notices. . If you have some symptoms but not all symptoms, continue to monitor at home and seek medical attention if your symptoms worsen. . If you are having a medical emergency, call 911.   ADDITIONAL HEALTHCARE OPTIONS FOR PATIENTS  [facility] / e-Visit: [email or web address]         [facility]: [phone]  [facility]: [phone]                   [facility]: [phone]  

## 2020-03-27 NOTE — Telephone Encounter (Signed)
Pt is aware of appt on 5/28. Per 5/7 sch msg.

## 2020-03-27 NOTE — Assessment & Plan Note (Signed)
So far, she is not symptomatic Liver enzymes are stable We will proceed with treatment

## 2020-03-27 NOTE — Assessment & Plan Note (Signed)
So far, she tolerated treatment well except for pancytopenia and hand-foot syndrome I plan to proceed with treatment with dose adjustment I recommend CT imaging in 3 weeks for objective assessment of response to therapy She is in agreement

## 2020-03-27 NOTE — Progress Notes (Signed)
Per Dr. Alvy Bimler OK to proceed with Today's [place]

## 2020-04-16 ENCOUNTER — Other Ambulatory Visit: Payer: Self-pay

## 2020-04-16 ENCOUNTER — Ambulatory Visit (HOSPITAL_COMMUNITY)
Admission: RE | Admit: 2020-04-16 | Discharge: 2020-04-16 | Disposition: A | Discharge status: Home / Self Care | Payer: Medicare PPO | Source: Ambulatory Visit | Attending: Hematology and Oncology | Admitting: Hematology and Oncology

## 2020-04-16 DIAGNOSIS — C787 Secondary malignant neoplasm of liver and intrahepatic bile duct: Secondary | ICD-10-CM | POA: Insufficient documentation

## 2020-04-16 DIAGNOSIS — C561 Malignant neoplasm of right ovary: Secondary | ICD-10-CM

## 2020-04-16 MED ORDER — IOHEXOL 9 MG/ML PO SOLN
500.0000 mL | ORAL | Status: AC
  Administered 2020-04-16: 1000 mL via ORAL

## 2020-04-16 MED ORDER — SODIUM CHLORIDE (PF) 0.9 % IJ SOLN
INTRAMUSCULAR | Status: AC
  Filled 2020-04-16: qty 50

## 2020-04-16 MED ORDER — HEPARIN SOD (PORK) LOCK FLUSH 100 UNIT/ML IV SOLN
INTRAVENOUS | Status: AC
  Filled 2020-04-16: qty 5

## 2020-04-16 MED ORDER — IOHEXOL 300 MG/ML  SOLN
100.0000 mL | Freq: Once | INTRAMUSCULAR | Status: AC | PRN
  Administered 2020-04-16: 100 mL via INTRAVENOUS

## 2020-04-16 MED ORDER — IOHEXOL 9 MG/ML PO SOLN
ORAL | Status: AC
  Filled 2020-04-16: qty 1000

## 2020-04-16 MED ORDER — HEPARIN SOD (PORK) LOCK FLUSH 100 UNIT/ML IV SOLN
500.0000 [IU] | Freq: Once | INTRAVENOUS | Status: AC
  Administered 2020-04-16: 500 [IU] via INTRAVENOUS

## 2020-04-17 ENCOUNTER — Inpatient Hospital Stay: Payer: Medicare PPO | Admitting: Hematology and Oncology

## 2020-04-17 ENCOUNTER — Other Ambulatory Visit: Payer: Self-pay

## 2020-04-17 ENCOUNTER — Other Ambulatory Visit: Payer: Self-pay | Admitting: Hematology and Oncology

## 2020-04-17 ENCOUNTER — Telehealth: Payer: Self-pay

## 2020-04-17 VITALS — BP 146/56 | HR 63 | Temp 98.2°F | Resp 18 | Ht 62.5 in | Wt 142.2 lb

## 2020-04-17 DIAGNOSIS — C787 Secondary malignant neoplasm of liver and intrahepatic bile duct: Secondary | ICD-10-CM | POA: Diagnosis not present

## 2020-04-17 DIAGNOSIS — Z5111 Encounter for antineoplastic chemotherapy: Secondary | ICD-10-CM | POA: Diagnosis not present

## 2020-04-17 DIAGNOSIS — Z7189 Other specified counseling: Secondary | ICD-10-CM | POA: Diagnosis not present

## 2020-04-17 DIAGNOSIS — W57XXXA Bitten or stung by nonvenomous insect and other nonvenomous arthropods, initial encounter: Secondary | ICD-10-CM | POA: Diagnosis not present

## 2020-04-17 DIAGNOSIS — C561 Malignant neoplasm of right ovary: Secondary | ICD-10-CM

## 2020-04-17 MED ORDER — ATENOLOL 25 MG PO TABS: 25.0000 mg | ORAL_TABLET | Freq: Every day | ORAL | 9 refills | Status: DC

## 2020-04-17 MED ORDER — DOXYCYCLINE HYCLATE 50 MG PO CAPS
50.0000 mg | ORAL_CAPSULE | Freq: Two times a day (BID) | ORAL | 0 refills | Status: DC
Start: 2020-04-17 — End: 2020-08-14

## 2020-04-17 NOTE — Assessment & Plan Note (Signed)
We had extensive discussion about goals of care She has advanced directive and living will at home.  She understood that goals of treatment is palliative

## 2020-04-17 NOTE — Progress Notes (Signed)
DISCONTINUE ON PATHWAY REGIMEN - Ovarian     A cycle is every 28 days:     Liposomal doxorubicin   **Always confirm dose/schedule in your pharmacy ordering system**  REASON: Disease Progression PRIOR TREATMENT: OVOS90: Liposomal Doxorubicin (Doxil) 40 mg/m2 q28 Days; Re-evaluate Every 3 Cycles, Treat Until Complete Response, Unacceptable Toxicity, or Disease Progression TREATMENT RESPONSE: Progressive Disease (PD)  START ON PATHWAY REGIMEN - Ovarian     A cycle is every 21 days:     Gemcitabine   **Always confirm dose/schedule in your pharmacy ordering system**  Patient Characteristics: Recurrent or Progressive Disease, Fourth Line and Beyond, 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

## 2020-04-17 NOTE — Telephone Encounter (Signed)
Called her per Dr. Alvy Bimler and instructed the antibiotic is doxycycline for twice daily, so take second dose about 5 pm. She verbalized understanding.

## 2020-04-17 NOTE — Progress Notes (Signed)
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 The intent is of palliative intent.  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 Arlyss Queen [place], Santiago Glad Teneriello, Willette Alma, Scott D. McMeekin, Meredeth Ide, Veleta Miners, [facility], [place]. Evonnie Pat, and [employer] [place] A B S T R A C T Purpose Ovarian cancer (OC) patients experiencing progressive disease (PD) within 6 months of platinum based therapy in the primary setting are considered platinum resistant (Pt-R). Currently, pegylated liposomal doxorubicin (PLD) is a standard of care for treatment of recurrent Pt-R disease. On the basis of promising phase II results, gemcitabine was compared with PLD for efficacy and safety in taxane-pretreated Pt-R OC patients. Patients and Methods Patients (n  195) with Pt-R OC were randomly assigned to either gemcitabine 1,000 mg/m2 (days 1 and 8; every 21 days) or PLD 50 mg/m2 (day 1; every 28 days) until PD or undue toxicity. Optional cross-over therapy was allowed at PD or at withdrawal because of toxicity. Primary end point was progression-free survival (PFS). Additional end points included tumor response, time to treatment failure, survival, and quality of life. Results In the gemcitabine and PLD groups, median PFS was 3.6 v 3.1 months; median overall survival was 12.7 v 13.5 months; overall response  rate (ORR) was 6.1% v 8.3%; and in the subset of patients with measurable disease, ORR was 9.2% v 11.7%, respectively. None of the efficacy end points showed a statistically significant difference between treatment groups. The PLD group experienced significantly more hand-foot syndrome and mucositis; the gemcitabine group experienced significantly more constipation, nausea/vomiting, fatigue, and neutropenia but not febrile neutropenia.  Conclusion Although this was not designed as an equivalency study, gemcitabine and PLD seem to have a comparable therapeutic index in this population of Pt-R taxane-pretreated OC patients. Single agent gemcitabine may be an acceptable alternative to PLD for patients with platinum resistant ovarian cancer J Clin Oncol 25:2811-2818.  2007 by American Society of Clinical Oncology  We discussed the risk, benefits, side effects of treatment and she is in agreement to proceed Given her history of severe pancytopenia and her age, I plan to prescribe 800 mg per metered squared upfront She would need minimum 3 cycles of treatment before repeat imaging study Previously, CA-125 marker was not helpful and I do not plan to order that  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[facility]) We discussed the rationale of why liver directed therapy is not indicated Her liver enzymes remain preserved  Goals of care, counseling/discussion We had extensive discussion about goals of care She has advanced directive and living will at home.  She understood that goals of treatment is palliative  Tick bite There is significant skin erythema around the site of the tick bite Due to high risk of infection given recent exposure to chemotherapy I recommend a week's worth of doxycycline for prophylaxis   Orders Placed This Encounter  Procedures  . CBC with Differential (Cancer Center Only)    Standing Status:   Standing  Number of Occurrences:   20    Standing Expiration Date:   04/17/2021  . CMP ([place] only)    Standing Status:   Standing    Number of Occurrences:   20    Standing Expiration Date:   04/17/2021    All questions were answered. The patient knows to call the clinic with any problems, questions or concerns. The total time spent in the appointment was 55 minutes encounter with patients including review of chart and various tests results, discussions about plan of care and coordination of care plan   Heath Lark, MD 04/17/2020 4:22 PM  INTERVAL HISTORY: Please see below for problem oriented charting. She returns to review results of CT imaging She tolerated last cycle of treatment well She had a tick bite on the right buttock area recently of which she removed There is surrounding skin erythema She denies recent fever or chills She had numerous questions regarding results of CT imaging and treatment options  SUMMARY OF ONCOLOGIC HISTORY: Oncology History Overview Note  Neg genetics. Additional tests revealed ER: 80%, PR 3% MSI stable Progressed on Avastin, Doxil and Taxol Allergic to carboplatin   Right ovarian epithelial cancer ([place])  07/01/2015 Initial Diagnosis   She presented with postmenopausal bleeding   07/02/2015 Imaging   US pelvis showed bilateral ovarian cyst   07/13/2015 Imaging   5.2 cm left adnexal cystic lesion, with indeterminate but probably benign characteristics. In a postmenopausal female, consider continued annual imaging followup with CT or MRI versus surgical evaluation.  Colonic diverticulosis. No radiographic evidence of diverticulitis.  Cholelithiasis.  No radiographic evidence of cholecystitis.  Small hiatal hernia.   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  The tumor involves serosal surface of the right ovary and adjacent right fallopian tube.  Cervix and endometrium were within normal limits. ER positive Peritoneal washing was positive.    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   08/13/2015 Tumor Marker   Patient's tumor was tested for the following markers: CA-125 Results of the tumor marker test revealed 96   08/25/2015 - 12/08/2015 Chemotherapy   The patient had 6 cycles of carboplatin and taxol   09/07/2015 Tumor Marker   Patient's tumor was tested for the following markers: CA-125 Results of the tumor marker test revealed 51   10/05/2015 Tumor Marker   Patient's tumor was tested for the following markers: CA-125 Results of the tumor marker test revealed 29   11/09/2015 Tumor Marker   Patient's tumor was tested for the following markers: CA-125 Results of the tumor marker test revealed 44   12/08/2015 Tumor Marker   Patient's tumor was tested for the following markers: CA-125 Results of the tumor marker test revealed 30   12/15/2015 Genetic Testing   Genetics testing normal by [employer] Breast Ovarian panel 12-15-15   01/11/2016 Imaging   1. The only finding of note are several adjacent peripheral abnormal hypodensities along the anterior superior spleen margin, differential diagnostic considerations including small peripheral interval splenic infarcts, splenic injury with subcapsular a fluid collections, or less likely metastatic disease to the splenic margin. This likely merits observation. 2. Other imaging findings of potential clinical significance: Small type 1 hiatal hernia. Aortoiliac atherosclerotic vascular disease. 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 Gallstones with potential mild gallbladder wall thickening.   03/07/2016 Tumor Marker   Patient's tumor was tested for the following markers: CA-125 Results of the tumor marker test revealed 24.2   04/06/2016 Imaging    1. No evidence of a ovarian cancer metastasis. 2. No ascites. 3. Post hysterectomy and oophorectomy. 4. Cholelithiasis with several large gallstones   04/06/2016 Tumor Marker   Patient's tumor was tested for the following markers: CA-125 Results of the tumor marker test revealed 22.8   05/30/2016 Tumor Marker   Patient's tumor was tested for the following markers: CA-125 Results of the tumor marker test revealed 21   09/07/2016 Tumor Marker   Patient's tumor was tested for the following markers: CA-125 Results of the tumor marker test revealed 22.4   11/28/2016 Tumor Marker   Patient's tumor was tested for the following markers: CA-125 Results of the tumor marker test revealed 18.8   02/23/2017 Tumor Marker   Patient's tumor was tested for the following markers: CA-125 Results of the tumor marker test revealed 19.1   06/02/2017 Tumor Marker   Patient's tumor was tested for the following markers: CA-125 Results of the tumor marker test revealed 18.3   08/24/2017 Mammogram   Pt reports mammogram complete   08/28/2017 Tumor Marker   Patient's tumor was tested for the following markers: CA-125 Results of the tumor marker test revealed 21.1   12/01/2017 Tumor Marker   Patient's tumor was tested for the following markers: CA-125 Results of the tumor marker test revealed 31.2   12/13/2017 Imaging   New 2.7 cm peritoneal soft tissue mass in anterior left lower quadrant, consistent with metastatic disease.  No other sites of metastatic disease identified.  Incidental findings including: Cholelithiasis. Colonic diverticulosis. Tiny hiatal hernia. Aortic atherosclerosis.   01/09/2018 - 05/07/2018 Chemotherapy   The patient had carboplatin and taxol; After 2nd cycle, she developed carboplatin allergy. She [facility] final 4 cycles, completed by 6/17. Avastin was added from 04/16/18 onwards   01/30/2018 Adverse Reaction   Potential carboplatin  allergy is suspected. She received half the dose of prescribed carboplatin on cycle 2   05/31/2018 Tumor Marker   Patient's tumor was tested for the following markers: CA-125 Results of the tumor marker test revealed 26   05/31/2018 Imaging   1. Peritoneal soft tissue nodule identified on the previous study has decreased in the interval the. No progressive findings in the abdomen or pelvis on today's study to suggest disease progression. 2. Tiny pulmonary nodules, likely benign. Attention on follow-up recommended. 3. Cholelithiasis. 4.  Aortic Atherosclerois (ICD10-170.0) 5. Tiny hiatal hernia.     06/04/2018 - 07/30/2019 Chemotherapy   The patient is placed on Avastin for maintenance   06/25/2018 Tumor Marker   Patient's tumor was tested for the following markers: CA-125 Results of the tumor marker test revealed 22.5   08/06/2018 Tumor Marker   Patient's tumor was tested for the following markers: CA-125 Results of the tumor marker test revealed 20.7   09/14/2018 Imaging   Status post hysterectomy and bilateral salpingo-oophorectomy.  Stable soft tissue nodule beneath the left lower anterior abdominal wall, likely reflecting stable peritoneal disease.  Scattered small subpleural nodules in the lungs bilaterally, measuring up to 3 mm, technically indeterminate although likely benign. Please note that Fleischner Society guidelines do not apply. Attention on follow-up is suggested.  No evidence of new/progressive metastatic disease.   09/14/2018 Tumor Marker   Patient's tumor was tested  for the following markers: CA-125 Results of the tumor marker test revealed 21.3   10/30/2018 Tumor Marker   Patient's tumor was tested for the following markers: CA-125 Results of the tumor marker test revealed 20.8   12/12/2018 Tumor Marker   Patient's tumor was tested for the following markers: CA-125 Results of the tumor marker test revealed 19.7   01/01/2019 Tumor Marker   Patient's tumor  was tested for the following markers: CA-125 Results of the tumor marker test revealed 21.6   01/22/2019 Tumor Marker   Patient's tumor was tested for the following markers: CA-125 Results of the tumor marker test revealed 21.3   02/12/2019 Tumor Marker   Patient's tumor was tested for the following markers: CA-125 Results of the tumor marker test revealed 21.6   04/16/2019 Tumor Marker   Patient's tumor was tested for the following markers: CA-125 Results of the tumor marker test revealed 21.7   05/07/2019 Tumor Marker   Patient's tumor was tested for the following markers: CA-125 Results of the tumor marker test revealed 27.3   05/28/2019 Tumor Marker   Patient's tumor was tested for the following markers: CA-125 Results of the tumor marker test revealed 24.5   07/09/2019 - 08/19/2019 Chemotherapy   The patient had bevacizumab for chemotherapy treatment.     07/09/2019 Tumor Marker   Patient's tumor was tested for the following markers: CA-125 Results of the tumor marker test revealed 23.3.   08/06/2019 Imaging   Ct abdomen and pelvis 1. New hypodense lesions of the left lobe of the liver measuring 2.5 x 2.5 cm (series 2, image 16) and right lobe of the liver measuring 1.1 x 1.1 cm (series 2, image 23), concerning for metastatic disease.   2. Interval enlargement of an irregular nodule of the omentum measuring 1.0 x 1.0 cm, previously 1.0 x 0.5 cm (series 2, image 51), concerning for peritoneal disease.   3.  Status post hysterectomy.   4. Other chronic and incidental findings as detailed above. Aortic Atherosclerosis (ICD10-I70.0).   08/16/2019 - 01/17/2020 Chemotherapy   The patient had weekly Taxol for chemotherapy treatment.     08/23/2019 Tumor Marker   Patient's tumor was tested for the following markers: CA-125 Results of the tumor marker test revealed 33.7.   09/06/2019 Tumor Marker   Patient's tumor was tested for the following markers: CA-125 Results of the tumor  marker test revealed 32.2   10/14/2019 Imaging   1. Interval decrease in hypodense masses of the left lobe and right lobe of the liver, mass in the left lobe measuring 1.7 x 1.6 cm, previously 2.5 x 2.5 cm (series 2, image 14) and in the right lobe of the liver measuring 0.8 x 0.7 cm, previously 1.1 x 1.1 cm (series 2, image 22).   2. Interval decrease in size of an omental nodule, measuring 0.8 x 0.7 cm, previously 1.0 x 1.0 cm (series 2, image 55).   3. Findings are consistent with improved metastatic disease. No new evidence of metastatic disease in the abdomen or pelvis.   4.  Status post hysterectomy.   5.  Cholelithiasis.   6.  Aortic Atherosclerosis (ICD10-I70.0).   01/23/2020 Imaging   1. 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 2. Slight increase in size of left lower quadrant omental soft tissue nodule, consistent with metastatic disease. 3. No new sites of metastatic disease identified. 4. Stable cholelithiasis and gallbladder wall thickening. No evidence of acute cholecystitis. 5. Colonic diverticulosis. No radiographic  evidence of diverticulitis. 6. Tiny hiatal hernia.   01/28/2020 Echocardiogram    1. Left ventricular ejection fraction, by estimation, is 60 to 65%. The left ventricle has normal function. The left ventricle has no regional wall motion abnormalities. Left ventricular diastolic parameters are consistent with Grade I diastolic dysfunction (impaired relaxation). The average left ventricular global longitudinal strain is -19.7 % (normal), however this may be underestimated given suboptimal tracking.  2. Right ventricular systolic function is normal. The right ventricular size is normal.  3. The mitral valve is normal in structure. Mild mitral valve regurgitation. No evidence of mitral stenosis.  4. The aortic valve is normal in structure. Aortic valve regurgitation is not visualized. No aortic stenosis is present.  5. The inferior vena cava is normal in  size with greater than 50% respiratory variability, suggesting right atrial pressure of 3 mmHg.   Conclusion(s)/Recommendation(s): Normal biventricular function without evidence of hemodynamically significant valvular heart disease   01/31/2020 - 04/23/2020 Chemotherapy   The patient had DOXOrubicin for chemotherapy treatment.     04/16/2020 Imaging   1. Mild increase in size of metastasis involving the left hepatic lobe. 2. Stable small left lower quadrant omental soft tissue nodule, suspicious for peritoneal metastasis. 3. No new sites of metastatic disease identified. 4. Cholelithiasis. No radiographic evidence of cholecystitis. 5. Colonic diverticulosis. No radiographic evidence of diverticulitis. 6. Small hiatal hernia.   Aortic Atherosclerosis (ICD10-I70.0).   05/01/2020 -  Chemotherapy   The patient had gemcitabine (GEMZAR) 1,368 mg in sodium chloride 0.9 % 250 mL chemo infusion, 800 mg/m2 = 1,368 mg (80 % of original dose 1,000 mg/m2), Intravenous,  Once, 0 of 4 cycles Dose modification: 800 mg/m2 (80 % of original dose 1,000 mg/m2, Cycle 1, Reason: Patient Age)  for chemotherapy treatment.      REVIEW OF SYSTEMS:   Constitutional: Denies fevers, chills or abnormal weight loss Eyes: Denies blurriness of vision Ears, nose, mouth, throat, and face: Denies mucositis or sore throat Respiratory: Denies cough, dyspnea or wheezes Cardiovascular: Denies palpitation, chest discomfort or lower extremity swelling Gastrointestinal:  Denies nausea, heartburn or change in bowel habits Lymphatics: Denies new lymphadenopathy or easy bruising Neurological:Denies numbness, tingling or new weaknesses Behavioral/Psych: Mood is stable, no new changes  All other systems were reviewed with the patient and are negative.  I have reviewed the past medical history, past surgical history, social history and family history with the patient and they are unchanged from previous note.  ALLERGIES:  is  allergic to carboplatin.  MEDICATIONS:  Current Outpatient Medications  Medication Sig Dispense Refill  . amLODipine (NORVASC) 10 MG tablet Take 1 tablet (10 mg total) by mouth daily. 90 tablet 3  . atenolol (TENORMIN) 25 MG tablet Take 1 tablet (25 mg total) by mouth daily. 30 tablet 9  . atorvastatin (LIPITOR) 10 MG tablet Take 1 tablet (10 mg total) by mouth daily. 30 tablet 2  . Coenzyme Q10 (CO Q 10) 100 MG CAPS Take 1 capsule by mouth daily.     [name] doxycycline (VIBRAMYCIN) 50 MG capsule Take 1 capsule (50 mg total) by mouth 2 (two) times daily. 14 capsule 0  . Glucosamine-Chondroit-Vit C-Mn (GLUCOSAMINE 1500 COMPLEX PO) Take 1 capsule by mouth 2 (two) times daily.    [name] lidocaine-prilocaine (EMLA) cream Apply to affected area once (Patient not taking: Reported on 11/14/2019) 30 g 3  . loratadine (CLARITIN) 10 MG tablet Take 10 mg by mouth daily as needed for allergies (hives).    [name]  Multiple Vitamin (MULTIVITAMIN) capsule Take 1 capsule by mouth daily.     . Omega-3 Fatty Acids (OMEGA-3 FISH OIL) 300 MG CAPS Take 1 capsule by mouth daily.     . ondansetron (ZOFRAN) 8 MG tablet Take 1 tablet (8 mg total) by mouth every 8 (eight) hours as needed for refractory nausea / vomiting. 30 tablet 1  . Polyethyl Glycol-Propyl Glycol (SYSTANE ULTRA OP) Apply 1 drop to eye 2 (two) times daily at 10 AM and 5 PM.    . Probiotic Product (PROBIOTIC ADVANCED PO) Take 1 capsule by mouth daily.    . prochlorperazine (COMPAZINE) 10 MG tablet Take 1 tablet (10 mg total) by mouth every 6 (six) hours as needed (Nausea or vomiting). 60 tablet 1  . vitamin E 400 UNIT capsule Take 400 Units by mouth every evening.      No current facility-administered medications for this visit.    PHYSICAL EXAMINATION: ECOG PERFORMANCE STATUS: 1 - Symptomatic but completely ambulatory  Vitals:   04/17/20 0919  BP: (!) 146/56  Pulse: 63  Resp: 18  Temp: 98.2 F (36.8 C)  SpO2: 100%   Filed Weights   04/17/20 0919   Weight: 142 lb 3.2 oz (64.5 kg)    GENERAL:alert, no distress and comfortable SKIN: Noted skin erythema near the tick bite  NEURO: alert & oriented x 3 with fluent speech, no focal motor/sensory deficits  LABORATORY DATA:  I have reviewed the data as listed    Component Value Date/Time   NA 142 03/27/2020 0919   NA 142 11/28/2016 0916   K 3.8 03/27/2020 0919   K 3.9 11/28/2016 0916   CL 109 03/27/2020 0919   CO2 24 03/27/2020 0919   CO2 28 11/28/2016 0916   GLUCOSE 98 03/27/2020 0919   GLUCOSE 78 11/28/2016 0916   BUN 17 03/27/2020 0919   BUN 16.8 11/28/2016 0916   CREATININE 0.78 03/27/2020 0919   CREATININE 1.15 (H) 05/28/2019 1345   CREATININE 0.8 11/28/2016 0916   CALCIUM 9.3 03/27/2020 0919   CALCIUM 10.5 (H) 11/28/2016 0916   PROT 6.2 (L) 03/27/2020 0919   PROT 7.3 11/28/2016 0916   ALBUMIN 3.4 (L) 03/27/2020 0919   ALBUMIN 4.3 11/28/2016 0916   AST 31 03/27/2020 0919   AST 23 05/28/2019 1345   AST 19 11/28/2016 0916   ALT 17 03/27/2020 0919   ALT 14 05/28/2019 1345   ALT 19 11/28/2016 0916   ALKPHOS 54 03/27/2020 0919   ALKPHOS 50 11/28/2016 0916   BILITOT 0.4 03/27/2020 0919   BILITOT 0.3 05/28/2019 1345   BILITOT 0.48 11/28/2016 0916   GFRNONAA >60 03/27/2020 0919   GFRNONAA 46 (L) 05/28/2019 1345   GFRAA >60 03/27/2020 0919   GFRAA 53 (L) 05/28/2019 1345    No results found for: SPEP, UPEP  Lab Results  Component Value Date   WBC 2.6 (L) 03/27/2020   NEUTROABS 1.1 (L) 03/27/2020   HGB 10.3 (L) 03/27/2020   HCT 31.1 (L) 03/27/2020   MCV 91.5 03/27/2020   PLT 117 (L) 03/27/2020      Chemistry      Component Value Date/Time   NA 142 03/27/2020 0919   NA 142 11/28/2016 0916   K 3.8 03/27/2020 0919   K 3.9 11/28/2016 0916   CL 109 03/27/2020 0919   CO2 24 03/27/2020 0919   CO2 28 11/28/2016 0916   BUN 17 03/27/2020 0919   BUN 16.8 11/28/2016 0916   CREATININE 0.78 03/27/2020 0919  CREATININE 1.15 (H) 05/28/2019 1345   CREATININE 0.8  11/28/2016 0916      Component Value Date/Time   CALCIUM 9.3 03/27/2020 0919   CALCIUM 10.5 (H) 11/28/2016 0916   ALKPHOS 54 03/27/2020 0919   ALKPHOS 50 11/28/2016 0916   AST 31 03/27/2020 0919   AST 23 05/28/2019 1345   AST 19 11/28/2016 0916   ALT 17 03/27/2020 0919   ALT 14 05/28/2019 1345   ALT 19 11/28/2016 0916   BILITOT 0.4 03/27/2020 0919   BILITOT 0.3 05/28/2019 1345   BILITOT 0.48 11/28/2016 0916       RADIOGRAPHIC STUDIES: I have reviewed multiple imaging studies with the patient I have personally reviewed the radiological images as listed and agreed with the findings in the report. CT ABDOMEN PELVIS W CONTRAST  Result Date: 04/16/2020 CLINICAL DATA:  Follow-up metastatic ovarian carcinoma. Currently undergoing chemotherapy. EXAM: CT ABDOMEN AND PELVIS WITH CONTRAST TECHNIQUE: Multidetector CT imaging of the abdomen and pelvis was performed using the standard protocol following bolus administration of intravenous contrast. CONTRAST:  151m OMNIPAQUE IOHEXOL 300 MG/ML  SOLN COMPARISON:  01/23/2020 FINDINGS: Lower Chest: No acute findings. Hepatobiliary: Hypovascular mass in the central left lobe now measures 3.4 x 3.1 cm on image 15/2, compared to 3.0 x 2.5 cm previously. No new liver masses are identified. Cholelithiasis is again seen, as well as focal gallbladder wall thickening in the fundal region likely due to adenomyomatosis. No signs of acute cholecystitis or biliary ductal dilatation. Pancreas:  No mass or inflammatory changes. Spleen: Within normal limits in size and appearance. Adrenals/Urinary Tract: No masses identified. No evidence of ureteral calculi or hydronephrosis. Unremarkable unopacified urinary bladder. Stomach/Bowel: Small hiatal hernia again seen. No evidence of obstruction, inflammatory process or abnormal fluid collections. Diverticulosis is seen mainly involving the sigmoid colon, however there is no evidence of diverticulitis. Vascular/Lymphatic: No  pathologically enlarged lymph nodes. No abdominal aortic aneurysm. Aortic atherosclerosis noted. Reproductive: Prior hysterectomy noted. Adnexal regions are unremarkable in appearance. No evidence of pelvic mass, or ascites. Other: Omental soft tissue nodule in the anterior left lower quadrant image 53/2 measures 1.3 x 0.8 cm, stable since previous study. No new omental or soft tissue nodules identified Musculoskeletal:  No suspicious bone lesions identified. IMPRESSION: 1. Mild increase in size of metastasis involving the left hepatic lobe. 2. Stable small left lower quadrant omental soft tissue nodule, suspicious for peritoneal metastasis. 3. No new sites of metastatic disease identified. 4. Cholelithiasis. No radiographic evidence of cholecystitis. 5. Colonic diverticulosis. No radiographic evidence of diverticulitis. 6. Small hiatal hernia. Aortic Atherosclerosis (ICD10-I70.0). Electronically Signed   By: JMarlaine HindM.D.   On: 04/16/2020 12:36

## 2020-04-17 NOTE — Assessment & Plan Note (Signed)
Unfortunately, the patient has developed disease progression She is currently at the fourth line of therapy We discussed the risk and benefits of docetaxel, versus gemcitabine, versus topotecan Ultimately, she has elected to choose gemcitabine  We discussed the role of chemotherapy. The intent is of palliative intent.  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 Arlyss Queen Sedro-Woolley, Santiago Glad Teneriello, Willette Alma, Scott D. McMeekin, Meredeth Ide, Veleta Miners, [facility], [place]. Evonnie Pat, and [employer] [place] A B S T R A C T Purpose Ovarian cancer (OC) patients experiencing progressive disease (PD) within 6 months of platinum based therapy in the primary setting are considered platinum resistant (Pt-R). Currently, pegylated liposomal doxorubicin (PLD) is a standard of care for treatment of recurrent Pt-R disease. On the basis of promising phase II results, gemcitabine was compared with PLD for efficacy and safety in taxane-pretreated Pt-R OC patients. Patients and Methods Patients (n  195) with Pt-R OC were randomly assigned to either gemcitabine 1,000 mg/m2 (days 1 and 8; every 21 days) or PLD 50 mg/m2 (day 1; every 28 days) until PD or undue toxicity. Optional cross-over therapy was allowed at PD or at withdrawal because of toxicity. Primary end point was progression-free survival (PFS). Additional end points included tumor response, time to treatment failure, survival, and quality of life. Results In the gemcitabine and PLD groups, median PFS was 3.6 v 3.1 months; median overall survival was 12.7 v 13.5 months; overall response rate (ORR) was 6.1% v 8.3%; and in the subset of patients with measurable disease, ORR was 9.2% v 11.7%, respectively. None of the efficacy end points showed a statistically significant  difference between treatment groups. The PLD group experienced significantly more hand-foot syndrome and mucositis; the gemcitabine group experienced significantly more constipation, nausea/vomiting, fatigue, and neutropenia but not febrile neutropenia.  Conclusion Although this was not designed as an equivalency study, gemcitabine and PLD seem to have a comparable therapeutic index in this population of Pt-R taxane-pretreated OC patients. Single agent gemcitabine may be an acceptable alternative to PLD for patients with platinum resistant ovarian cancer J Clin Oncol 25:2811-2818.  2007 by American Society of Clinical Oncology  We discussed the risk, benefits, side effects of treatment and she is in agreement to proceed Given her history of severe pancytopenia and her age, I plan to prescribe 800 mg per metered squared upfront She would need minimum 3 cycles of treatment before repeat imaging study Previously, CA-125 marker was not helpful and I do not plan to order that

## 2020-04-17 NOTE — Assessment & Plan Note (Signed)
There is significant skin erythema around the site of the tick bite Due to high risk of infection given recent exposure to chemotherapy I recommend a week's worth of doxycycline for prophylaxis

## 2020-04-17 NOTE — Assessment & Plan Note (Signed)
We discussed the rationale of why liver directed therapy is not indicated Her liver enzymes remain preserved

## 2020-04-21 ENCOUNTER — Telehealth: Payer: Self-pay | Admitting: Hematology and Oncology

## 2020-04-21 ENCOUNTER — Telehealth: Payer: Self-pay

## 2020-04-21 ENCOUNTER — Encounter: Payer: Self-pay | Admitting: Hematology and Oncology

## 2020-04-21 NOTE — Telephone Encounter (Signed)
Pt called to ask if she should have someone drive her for her first tx. Advised pt that it would be a safe option to have a driver for her first tx just for precautionary measures, then judge whether or not she feels safe to drive thereafter. Pt verbalized thanks and understanding.

## 2020-04-21 NOTE — Telephone Encounter (Signed)
Scheduled appts per 5/28 sch msg. Pt confirmed appt dates and time. Pt had questions regarding her treatment and was transferred to nurse.

## 2020-04-28 NOTE — Progress Notes (Signed)
Pharmacist Chemotherapy Monitoring - Initial Assessment    Anticipated start date: 05/01/20   Regimen:  . Are orders appropriate based on the patient's diagnosis, regimen, and cycle? Yes . Does the plan date match the patient's scheduled date? Yes . Is the sequencing of drugs appropriate? Yes . Are the premedications appropriate for the patient's regimen? Yes . Prior Authorization for treatment is: Not Started o If applicable, is the correct biosimilar selected based on the patient's insurance? not applicable  Organ Function and Labs: [name] Are dose adjustments needed based on the patient's renal function, hepatic function, or hematologic function? Gemcitabine dose reduction to 800 mg/m2 . Are appropriate labs ordered prior to the start of patient's treatment? Yes . Other organ system assessment, if indicated: N/A . The following baseline labs, if indicated, have been ordered: N/A  Dose Assessment: . Are the drug doses appropriate? Yes . Are the following correct: o Drug concentrations Yes o IV fluid compatible with drug Yes o Administration routes Yes o Timing of therapy Yes . If applicable, does the patient have documented access for treatment and/or plans for port-a-cath placement? yes . If applicable, have lifetime cumulative doses been properly documented and assessed? yes Lifetime Dose Tracking  . Carboplatin: 3,130 mg = 0.01 % of the maximum lifetime dose of 999,999,999 mg  . Liposomal Doxorubicin: 110.999 mg/m2 (188 mg) = 24.67 % of the maximum lifetime dose of 450 mg/m2  o   Toxicity Monitoring/Prevention: . The patient has the following take home antiemetics prescribed: Ondansetron and Prochlorperazine . The patient has the following take home medications prescribed: N/A . Medication allergies and previous infusion related reactions, if applicable, have been reviewed and addressed. Yes . The patient's current medication list has been assessed for drug-drug interactions with  their chemotherapy regimen. no significant drug-drug interactions were identified on review.  Order Review: . Are the treatment plan orders signed? Yes . Is the patient scheduled to see a provider prior to their treatment? No  I verify that I have reviewed each item in the above checklist and answered each question accordingly.  Norwood Levo [facility] 04/28/2020 7:02 AM

## 2020-04-29 ENCOUNTER — Encounter: Payer: Self-pay | Admitting: Hematology and Oncology

## 2020-04-30 ENCOUNTER — Telehealth: Payer: Self-pay | Admitting: Oncology

## 2020-04-30 NOTE — Telephone Encounter (Signed)
Emily Hull with an appointment to see Dr. Denman George for a second opinion on 05/20/20 at 10:45 am.  She verbalized understanding and agreement.

## 2020-05-01 ENCOUNTER — Inpatient Hospital Stay: Payer: Medicare PPO | Attending: Gynecology

## 2020-05-01 ENCOUNTER — Inpatient Hospital Stay: Payer: Medicare PPO

## 2020-05-01 ENCOUNTER — Other Ambulatory Visit: Payer: Self-pay

## 2020-05-01 VITALS — BP 172/69 | HR 69 | Temp 98.0°F | Resp 18 | Wt 139.8 lb

## 2020-05-01 DIAGNOSIS — K5730 Diverticulosis of large intestine without perforation or abscess without bleeding: Secondary | ICD-10-CM | POA: Insufficient documentation

## 2020-05-01 DIAGNOSIS — K449 Diaphragmatic hernia without obstruction or gangrene: Secondary | ICD-10-CM | POA: Diagnosis not present

## 2020-05-01 DIAGNOSIS — C561 Malignant neoplasm of right ovary: Secondary | ICD-10-CM

## 2020-05-01 DIAGNOSIS — C787 Secondary malignant neoplasm of liver and intrahepatic bile duct: Secondary | ICD-10-CM | POA: Insufficient documentation

## 2020-05-01 DIAGNOSIS — I10 Essential (primary) hypertension: Secondary | ICD-10-CM | POA: Insufficient documentation

## 2020-05-01 DIAGNOSIS — Z170 Estrogen receptor positive status [ER+]: Secondary | ICD-10-CM | POA: Insufficient documentation

## 2020-05-01 DIAGNOSIS — Z7189 Other specified counseling: Secondary | ICD-10-CM

## 2020-05-01 DIAGNOSIS — D701 Agranulocytosis secondary to cancer chemotherapy: Secondary | ICD-10-CM | POA: Insufficient documentation

## 2020-05-01 DIAGNOSIS — M8580 Other specified disorders of bone density and structure, unspecified site: Secondary | ICD-10-CM | POA: Insufficient documentation

## 2020-05-01 DIAGNOSIS — Z5111 Encounter for antineoplastic chemotherapy: Secondary | ICD-10-CM | POA: Diagnosis not present

## 2020-05-01 DIAGNOSIS — Z90722 Acquired absence of ovaries, bilateral: Secondary | ICD-10-CM | POA: Diagnosis not present

## 2020-05-01 DIAGNOSIS — T451X5A Adverse effect of antineoplastic and immunosuppressive drugs, initial encounter: Secondary | ICD-10-CM | POA: Insufficient documentation

## 2020-05-01 DIAGNOSIS — Z90710 Acquired absence of both cervix and uterus: Secondary | ICD-10-CM | POA: Diagnosis not present

## 2020-05-01 DIAGNOSIS — E7800 Pure hypercholesterolemia, unspecified: Secondary | ICD-10-CM | POA: Insufficient documentation

## 2020-05-01 LAB — CBC WITH DIFFERENTIAL (CANCER CENTER ONLY)
Abs Immature Granulocytes: 0 10*3/uL (ref 0.00–0.07)
Basophils Absolute: 0.1 10*3/uL (ref 0.0–0.1)
Basophils Relative: 1 %
Eosinophils Absolute: 0.3 10*3/uL (ref 0.0–0.5)
Eosinophils Relative: 8 %
HCT: 32.5 % — ABNORMAL LOW (ref 36.0–46.0)
Hemoglobin: 10.7 g/dL — ABNORMAL LOW (ref 12.0–15.0)
Immature Granulocytes: 0 %
Lymphocytes Relative: 23 %
Lymphs Abs: 0.8 10*3/uL (ref 0.7–4.0)
MCH: 31.3 pg (ref 26.0–34.0)
MCHC: 32.9 g/dL (ref 30.0–36.0)
MCV: 95 fL (ref 80.0–100.0)
Monocytes Absolute: 0.6 10*3/uL (ref 0.1–1.0)
Monocytes Relative: 15 %
Neutro Abs: 2 10*3/uL (ref 1.7–7.7)
Neutrophils Relative %: 53 %
Platelet Count: 116 10*3/uL — ABNORMAL LOW (ref 150–400)
RBC: 3.42 MIL/uL — ABNORMAL LOW (ref 3.87–5.11)
RDW: 15.6 % — ABNORMAL HIGH (ref 11.5–15.5)
WBC Count: 3.7 10*3/uL — ABNORMAL LOW (ref 4.0–10.5)
nRBC: 0 % (ref 0.0–0.2)

## 2020-05-01 LAB — CMP (CANCER CENTER ONLY)
ALT: 15 U/L (ref 0–44)
AST: 30 U/L (ref 15–41)
Albumin: 3.6 g/dL (ref 3.5–5.0)
Alkaline Phosphatase: 53 U/L (ref 38–126)
Anion gap: 9 (ref 5–15)
BUN: 22 mg/dL (ref 8–23)
CO2: 25 mmol/L (ref 22–32)
Calcium: 9.5 mg/dL (ref 8.9–10.3)
Chloride: 111 mmol/L (ref 98–111)
Creatinine: 0.8 mg/dL (ref 0.44–1.00)
GFR, Est AFR Am: >60 mL/min (ref >60)
GFR, Estimated: >60 mL/min (ref >60)
Glucose, Bld: 100 mg/dL — ABNORMAL HIGH (ref 70–99)
Potassium: 3.8 mmol/L (ref 3.5–5.1)
Sodium: 145 mmol/L (ref 135–145)
Total Bilirubin: 0.4 mg/dL (ref 0.3–1.2)
Total Protein: 6.4 g/dL — ABNORMAL LOW (ref 6.5–8.1)

## 2020-05-01 MED ORDER — SODIUM CHLORIDE 0.9% FLUSH
10.0000 mL | Freq: Once | INTRAVENOUS | Status: AC
  Administered 2020-05-01: 10 mL
  Filled 2020-05-01: qty 10

## 2020-05-01 MED ORDER — SODIUM CHLORIDE 0.9% FLUSH
10.0000 mL | INTRAVENOUS | Status: DC | PRN
  Administered 2020-05-01: 10 mL
  Filled 2020-05-01: qty 10

## 2020-05-01 MED ORDER — HEPARIN SOD (PORK) LOCK FLUSH 100 UNIT/ML IV SOLN
500.0000 [IU] | Freq: Once | INTRAVENOUS | Status: AC | PRN
  Administered 2020-05-01: 500 [IU]
  Filled 2020-05-01: qty 5

## 2020-05-01 MED ORDER — SODIUM CHLORIDE 0.9 % IV SOLN
Freq: Once | INTRAVENOUS | Status: AC
  Filled 2020-05-01: qty 250

## 2020-05-01 MED ORDER — SODIUM CHLORIDE 0.9 % IV SOLN
800.0000 mg/m2 | Freq: Once | INTRAVENOUS | Status: AC
  Administered 2020-05-01: 1368 mg via INTRAVENOUS
  Filled 2020-05-01: qty 35.98

## 2020-05-01 MED ORDER — PROCHLORPERAZINE MALEATE 10 MG PO TABS
10.0000 mg | ORAL_TABLET | Freq: Once | ORAL | Status: AC
  Administered 2020-05-01: 10 mg via ORAL

## 2020-05-01 MED ORDER — PROCHLORPERAZINE MALEATE 10 MG PO TABS
ORAL_TABLET | ORAL | Status: AC
  Filled 2020-05-01: qty 1

## 2020-05-01 NOTE — Patient Instructions (Signed)
[facility] Discharge Instructions for Patients Receiving Chemotherapy  Today you received the following chemotherapy agents: gemcitabine.  To help prevent nausea and vomiting after your treatment, we encourage you to take your nausea medication as directed.   If you develop nausea and vomiting that is not controlled by your nausea medication, call the clinic.   BELOW ARE SYMPTOMS THAT SHOULD BE REPORTED IMMEDIATELY:  *FEVER GREATER THAN 100.5 F  *CHILLS WITH OR WITHOUT FEVER  NAUSEA AND VOMITING THAT IS NOT CONTROLLED WITH YOUR NAUSEA MEDICATION  *UNUSUAL SHORTNESS OF BREATH  *UNUSUAL BRUISING OR BLEEDING  TENDERNESS IN MOUTH AND THROAT WITH OR WITHOUT PRESENCE OF ULCERS  *URINARY PROBLEMS  *BOWEL PROBLEMS  UNUSUAL RASH Items with * indicate a potential emergency and should be followed up as soon as possible.  Feel free to call the clinic should you have any questions or concerns. The clinic phone number is [phone].  Please show the CHEMO ALERT CARD at check-in to the Emergency Department and triage nurse.  Gemcitabine injection What is this medicine? GEMCITABINE (jem SYE ta been) is a chemotherapy drug. This medicine is used to treat many types of cancer like breast cancer, lung cancer, pancreatic cancer, and ovarian cancer. This medicine may be used for other purposes; ask your health care provider or pharmacist if you have questions. COMMON BRAND NAME(S): Gemzar, Infugem What should I tell my health care provider before I take this medicine? They need to know if you have any of these conditions:  blood disorders  infection  kidney disease  liver disease  lung or breathing disease, like asthma  recent or ongoing radiation therapy  an unusual or allergic reaction to gemcitabine, other chemotherapy, other medicines, foods, dyes, or preservatives  pregnant or trying to get pregnant  breast-feeding How should I use this medicine? This  drug is given as an infusion into a vein. It is administered in a hospital or clinic by a specially trained health care professional. Talk to your pediatrician regarding the use of this medicine in children. Special care may be needed. Overdosage: If you think you have taken too much of this medicine contact a poison control center or emergency room at once. NOTE: This medicine is only for you. Do not share this medicine with others. What if I miss a dose? It is important not to miss your dose. Call your doctor or health care professional if you are unable to keep an appointment. What may interact with this medicine?  medicines to increase blood counts like filgrastim, pegfilgrastim, sargramostim  some other chemotherapy drugs like cisplatin  vaccines Talk to your doctor or health care professional before taking any of these medicines:  acetaminophen  aspirin  ibuprofen  ketoprofen  naproxen This list may not describe all possible interactions. Give your health care provider a list of all the medicines, herbs, non-prescription drugs, or dietary supplements you use. Also tell them if you smoke, drink alcohol, or use illegal drugs. Some items may interact with your medicine. What should I watch for while using this medicine? Visit your doctor for checks on your progress. This drug may make you feel generally unwell. This is not uncommon, as chemotherapy can affect healthy cells as well as cancer cells. Report any side effects. Continue your course of treatment even though you feel ill unless your doctor tells you to stop. In some cases, you may be given additional medicines to help with side effects. Follow all directions for their use. Call   your doctor or health care professional for advice if you get a fever, chills or sore throat, or other symptoms of a cold or flu. Do not treat yourself. This drug decreases your body's ability to fight infections. Try to avoid being around people who  are sick. This medicine may increase your risk to bruise or bleed. Call your doctor or health care professional if you notice any unusual bleeding. Be careful brushing and flossing your teeth or using a toothpick because you may get an infection or bleed more easily. If you have any dental work done, tell your dentist you are receiving this medicine. Avoid taking products that contain aspirin, acetaminophen, ibuprofen, naproxen, or ketoprofen unless instructed by your doctor. These medicines may hide a fever. Do not become pregnant while taking this medicine or for 6 months after stopping it. Women should inform their doctor if they wish to become pregnant or think they might be pregnant. Men should not father a child while taking this medicine and for 3 months after stopping it. There is a potential for serious side effects to an unborn child. Talk to your health care professional or pharmacist for more information. Do not breast-feed an infant while taking this medicine or for at least 1 week after stopping it. Men should inform their doctors if they wish to father a child. This medicine may lower sperm counts. Talk with your doctor or health care professional if you are concerned about your fertility. What side effects may I notice from receiving this medicine? Side effects that you should report to your doctor or health care professional as soon as possible:  allergic reactions like skin rash, itching or hives, swelling of the face, lips, or tongue  breathing problems  pain, redness, or irritation at site where injected  signs and symptoms of a dangerous change in heartbeat or heart rhythm like chest pain; dizziness; fast or irregular heartbeat; palpitations; feeling faint or lightheaded, falls; breathing problems  signs of decreased platelets or bleeding - bruising, pinpoint red spots on the skin, black, tarry stools, blood in the urine  signs of decreased red blood cells - unusually weak or  tired, feeling faint or lightheaded, falls  signs of infection - fever or chills, cough, sore throat, pain or difficulty passing urine  signs and symptoms of kidney injury like trouble passing urine or change in the amount of urine  signs and symptoms of liver injury like dark yellow or brown urine; general ill feeling or flu-like symptoms; light-colored stools; loss of appetite; nausea; right upper belly pain; unusually weak or tired; yellowing of the eyes or skin  swelling of ankles, feet, hands Side effects that usually do not require medical attention (report to your doctor or health care professional if they continue or are bothersome):  constipation  diarrhea  hair loss  loss of appetite  nausea  rash  vomiting This list may not describe all possible side effects. Call your doctor for medical advice about side effects. You may report side effects to FDA at 1-800-FDA-1088. Where should I keep my medicine? This drug is given in a hospital or clinic and will not be stored at home. NOTE: This sheet is a summary. It may not cover all possible information. If you have questions about this medicine, talk to your doctor, pharmacist, or health care provider.  2020 Elsevier/Gold Standard (2018-01-31 18:06:11)  

## 2020-05-08 ENCOUNTER — Other Ambulatory Visit: Payer: Self-pay

## 2020-05-08 ENCOUNTER — Telehealth: Payer: Self-pay

## 2020-05-08 ENCOUNTER — Inpatient Hospital Stay: Payer: Medicare PPO

## 2020-05-08 ENCOUNTER — Telehealth: Payer: Self-pay | Admitting: Hematology and Oncology

## 2020-05-08 ENCOUNTER — Inpatient Hospital Stay: Payer: Medicare PPO | Admitting: Hematology and Oncology

## 2020-05-08 ENCOUNTER — Encounter: Payer: Self-pay | Admitting: Hematology and Oncology

## 2020-05-08 VITALS — BP 168/76 | HR 72 | Temp 97.6°F | Resp 18 | Ht 62.2 in | Wt 140.6 lb

## 2020-05-08 DIAGNOSIS — C561 Malignant neoplasm of right ovary: Secondary | ICD-10-CM

## 2020-05-08 DIAGNOSIS — D539 Nutritional anemia, unspecified: Secondary | ICD-10-CM | POA: Insufficient documentation

## 2020-05-08 DIAGNOSIS — C787 Secondary malignant neoplasm of liver and intrahepatic bile duct: Secondary | ICD-10-CM

## 2020-05-08 DIAGNOSIS — I10 Essential (primary) hypertension: Secondary | ICD-10-CM | POA: Diagnosis not present

## 2020-05-08 DIAGNOSIS — Z7189 Other specified counseling: Secondary | ICD-10-CM

## 2020-05-08 DIAGNOSIS — D61818 Other pancytopenia: Secondary | ICD-10-CM | POA: Diagnosis not present

## 2020-05-08 DIAGNOSIS — T451X5A Adverse effect of antineoplastic and immunosuppressive drugs, initial encounter: Secondary | ICD-10-CM | POA: Insufficient documentation

## 2020-05-08 DIAGNOSIS — Z5111 Encounter for antineoplastic chemotherapy: Secondary | ICD-10-CM | POA: Diagnosis not present

## 2020-05-08 DIAGNOSIS — R110 Nausea: Secondary | ICD-10-CM

## 2020-05-08 LAB — CBC WITH DIFFERENTIAL (CANCER CENTER ONLY)
Abs Immature Granulocytes: 0.01 10*3/uL (ref 0.00–0.07)
Basophils Absolute: 0 10*3/uL (ref 0.0–0.1)
Basophils Relative: 1 %
Eosinophils Absolute: 0.1 10*3/uL (ref 0.0–0.5)
Eosinophils Relative: 5 %
HCT: 30 % — ABNORMAL LOW (ref 36.0–46.0)
Hemoglobin: 10 g/dL — ABNORMAL LOW (ref 12.0–15.0)
Immature Granulocytes: 1 %
Lymphocytes Relative: 45 %
Lymphs Abs: 0.8 10*3/uL (ref 0.7–4.0)
MCH: 31.3 pg (ref 26.0–34.0)
MCHC: 33.3 g/dL (ref 30.0–36.0)
MCV: 93.8 fL (ref 80.0–100.0)
Monocytes Absolute: 0.3 10*3/uL (ref 0.1–1.0)
Monocytes Relative: 15 %
Neutro Abs: 0.6 10*3/uL — ABNORMAL LOW (ref 1.7–7.7)
Neutrophils Relative %: 33 %
Platelet Count: 64 10*3/uL — ABNORMAL LOW (ref 150–400)
RBC: 3.2 MIL/uL — ABNORMAL LOW (ref 3.87–5.11)
RDW: 14.6 % (ref 11.5–15.5)
WBC Count: 1.8 10*3/uL — ABNORMAL LOW (ref 4.0–10.5)
nRBC: 0 % (ref 0.0–0.2)

## 2020-05-08 LAB — CMP (CANCER CENTER ONLY)
ALT: 21 U/L (ref 0–44)
AST: 37 U/L (ref 15–41)
Albumin: 3.6 g/dL (ref 3.5–5.0)
Alkaline Phosphatase: 60 U/L (ref 38–126)
Anion gap: 10 (ref 5–15)
BUN: 21 mg/dL (ref 8–23)
CO2: 22 mmol/L (ref 22–32)
Calcium: 9.5 mg/dL (ref 8.9–10.3)
Chloride: 110 mmol/L (ref 98–111)
Creatinine: 0.79 mg/dL (ref 0.44–1.00)
GFR, Est AFR Am: >60 mL/min (ref >60)
GFR, Estimated: >60 mL/min (ref >60)
Glucose, Bld: 94 mg/dL (ref 70–99)
Potassium: 3.9 mmol/L (ref 3.5–5.1)
Sodium: 142 mmol/L (ref 135–145)
Total Bilirubin: 0.5 mg/dL (ref 0.3–1.2)
Total Protein: 6.5 g/dL (ref 6.5–8.1)

## 2020-05-08 LAB — IRON AND TIBC
Iron: 72 ug/dL (ref 41–142)
Saturation Ratios: 26 % (ref 21–57)
TIBC: 280 ug/dL (ref 236–444)
UIBC: 208 ug/dL (ref 120–384)

## 2020-05-08 LAB — FERRITIN: Ferritin: 248 ng/mL (ref 11–307)

## 2020-05-08 LAB — VITAMIN B12: Vitamin B-12: 667 pg/mL (ref 180–914)

## 2020-05-08 MED ORDER — SODIUM CHLORIDE 0.9% FLUSH
10.0000 mL | Freq: Once | INTRAVENOUS | Status: AC
  Administered 2020-05-08: 10 mL
  Filled 2020-05-08: qty 10

## 2020-05-08 NOTE — Assessment & Plan Note (Signed)
Her blood pressure is elevated here today but from home is satisfactory Observe closely for now

## 2020-05-08 NOTE — Telephone Encounter (Signed)
-----   Message from Heath Lark, MD sent at 05/08/2020  2:09 PM EDT ----- Regarding: pls call her, iron studies and B12 are normal as expected

## 2020-05-08 NOTE — Assessment & Plan Note (Signed)
We discussed the role of antiemetics She will take antiemetics as needed

## 2020-05-08 NOTE — Assessment & Plan Note (Signed)
She has an appointment to see Dr. Denman George for second opinion, pending She had very mild nausea from recent chemotherapy but today's blood work showed profound pancytopenia We will cancel her treatment today I will delay her treatment until next week She is already received drastic dose adjustment to gemcitabine I plan to adjust based on her treatment to every other week for cycle of every 28 days instead of prescribing further dose adjustment She needs minimum 3 cycles of treatment before repeating imaging study for objective assessment of response to therapy

## 2020-05-08 NOTE — Patient Instructions (Signed)

## 2020-05-08 NOTE — Progress Notes (Signed)
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 Denman George for second opinion, pending She had very mild nausea from recent chemotherapy but today's blood work showed profound pancytopenia We will cancel her treatment today I will delay her treatment until next week She is already received drastic dose adjustment to gemcitabine I plan to adjust based on her treatment to every other week for cycle of every 28 days instead of prescribing further dose adjustment She needs minimum 3 cycles of treatment before repeating imaging study for objective assessment of response to therapy  Pancytopenia, acquired [facility]) She has profound pancytopenia despite upfront dose adjustment I plan to space out her treatment to every other week If her blood count next week showed persistent pancytopenia, I plan to reduce the dose of gemcitabine further to 600 mg/m She does not need transfusion support We discussed risk of neutropenia and I recommend neutropenic precaution I will check iron studies and vitamin B12 levels just to be sure this is not due to nutritional deficiency It is very likely due to chemotherapy  Essential hypertension Her blood pressure is elevated here today but from home is satisfactory Observe closely for now  Chemotherapy-induced nausea We discussed the role of antiemetics She will take antiemetics as needed  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 The patient knows to call the clinic with any problems, questions or concerns. The total time spent in the appointment was 25 minutes encounter with patients including review of chart and various tests results, discussions about plan of care and coordination of care plan   Heath Lark, MD 05/08/2020 12:48 PM  INTERVAL HISTORY: Please see below for problem oriented charting. She returns for cycle 1, day 8 of treatment She denies fatigue No recent infection, fever or chills The patient denies any recent signs or symptoms of bleeding such as spontaneous epistaxis, hematuria or hematochezia. She has some mild nausea after chemo but no vomiting Denies constipation  SUMMARY OF ONCOLOGIC HISTORY: Oncology History Overview Note  Neg genetics. Additional tests revealed ER: 80%, PR 3% MSI stable Progressed on Avastin, Doxil and Taxol Allergic to carboplatin   Right ovarian epithelial cancer ([place])  07/01/2015 Initial Diagnosis   She presented with postmenopausal bleeding   07/02/2015 Imaging   US pelvis showed bilateral ovarian cyst   07/13/2015 Imaging   5.2 cm left adnexal cystic lesion, with indeterminate but probably benign characteristics. In a postmenopausal female, consider continued annual imaging followup with CT or MRI versus surgical evaluation.  Colonic diverticulosis. No radiographic evidence of diverticulitis.  Cholelithiasis.  No radiographic evidence of cholecystitis.  Small hiatal hernia.   07/30/2015 Pathology Results   Outside pathology showed right ovary with high-grade  serous carcinoma, 2 cm in maximum dimension.  The tumor involves serosal surface of the right ovary and adjacent right fallopian tube.  Cervix and endometrium were within normal limits. ER positive Peritoneal washing was positive.   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   08/13/2015 Tumor Marker   Patient's tumor was tested for the following markers: CA-125 Results of the tumor marker test revealed 96   08/25/2015 - 12/08/2015 Chemotherapy   The patient had 6 cycles of carboplatin and taxol   09/07/2015 Tumor Marker   Patient's tumor was tested for the following markers: CA-125 Results of the tumor marker test revealed 51   10/05/2015 Tumor Marker   Patient's tumor was tested for the following markers: CA-125 Results of the tumor marker test revealed 29   11/09/2015 Tumor Marker   Patient's tumor was tested for the following markers: CA-125 Results of the tumor marker test revealed 44   12/08/2015 Tumor Marker   Patient's tumor was tested for the following markers: CA-125 Results of the tumor marker test revealed 30   12/15/2015 Genetic Testing   Genetics testing normal by [employer] Breast Ovarian panel 12-15-15   01/11/2016 Imaging   1. The only finding of note are several adjacent peripheral abnormal hypodensities along the anterior superior spleen margin, differential diagnostic considerations including small peripheral interval splenic infarcts, splenic injury with subcapsular a fluid collections, or less likely metastatic disease to the splenic margin. This likely merits observation. 2. Other imaging findings of potential clinical significance: Small type 1 hiatal hernia. Aortoiliac atherosclerotic vascular disease. Lower lumbar spondylosis and degenerative disc disease. Gallstones with potential mild gallbladder wall thickening.   03/07/2016 Tumor Marker   Patient's tumor was tested for the following markers: CA-125 Results of the tumor marker test revealed 24.2   04/06/2016 Imaging   1. No evidence of a ovarian cancer metastasis. 2. No ascites. 3.  Post hysterectomy and oophorectomy. 4. Cholelithiasis with several large gallstones   04/06/2016 Tumor Marker   Patient's tumor was tested for the following markers: CA-125 Results of the tumor marker test revealed 22.8   05/30/2016 Tumor Marker   Patient's tumor was tested for the following markers: CA-125 Results of the tumor marker test revealed 21   09/07/2016 Tumor Marker   Patient's tumor was tested for the following markers: CA-125 Results of the tumor marker test revealed 22.4   11/28/2016 Tumor Marker   Patient's tumor was tested for the following markers: CA-125 Results of the tumor marker test revealed 18.8   02/23/2017 Tumor Marker   Patient's tumor was tested for the following markers: CA-125 Results of the tumor marker test revealed 19.1   06/02/2017 Tumor Marker   Patient's tumor was tested for the following markers: CA-125 Results of the tumor marker test revealed 18.3   08/24/2017 Mammogram   Pt reports mammogram complete   08/28/2017 Tumor Marker   Patient's tumor was tested for the following markers: CA-125 Results of the tumor marker test revealed 21.1   12/01/2017 Tumor Marker   Patient's tumor was tested for the following markers: CA-125 Results of the tumor marker test revealed 31.2   12/13/2017 Imaging   New 2.7 cm peritoneal soft tissue mass in anterior left lower quadrant, consistent with metastatic disease.  No other sites of metastatic disease identified.  Incidental findings  including: Cholelithiasis. Colonic diverticulosis. Tiny hiatal hernia. Aortic atherosclerosis.   01/09/2018 - 05/07/2018 Chemotherapy   The patient had carboplatin and taxol; After 2nd cycle, she developed carboplatin allergy. She received carboplatin desensitization protocol at [facility] for final 4 cycles, completed by 6/17. Avastin was added from 04/16/18 onwards   01/30/2018 Adverse Reaction   Potential carboplatin allergy is suspected. She received half the dose of prescribed  carboplatin on cycle 2   05/31/2018 Tumor Marker   Patient's tumor was tested for the following markers: CA-125 Results of the tumor marker test revealed 26   05/31/2018 Imaging   1. Peritoneal soft tissue nodule identified on the previous study has decreased in the interval the. No progressive findings in the abdomen or pelvis on today's study to suggest disease progression. 2. Tiny pulmonary nodules, likely benign. Attention on follow-up recommended. 3. Cholelithiasis. 4.  Aortic Atherosclerois (ICD10-170.0) 5. Tiny hiatal hernia.     06/04/2018 - 07/30/2019 Chemotherapy   The patient is placed on Avastin for maintenance   06/25/2018 Tumor Marker   Patient's tumor was tested for the following markers: CA-125 Results of the tumor marker test revealed 22.5   08/06/2018 Tumor Marker   Patient's tumor was tested for the following markers: CA-125 Results of the tumor marker test revealed 20.7   09/14/2018 Imaging   Status post hysterectomy and bilateral salpingo-oophorectomy.  Stable soft tissue nodule beneath the left lower anterior abdominal wall, likely reflecting stable peritoneal disease.  Scattered small subpleural nodules in the lungs bilaterally, measuring up to 3 mm, technically indeterminate although likely benign. Please note that Fleischner Society guidelines do not apply. Attention on follow-up is suggested.  No evidence of new/progressive metastatic disease.   09/14/2018 Tumor Marker   Patient's tumor was tested for the following markers: CA-125 Results of the tumor marker test revealed 21.3   10/30/2018 Tumor Marker   Patient's tumor was tested for the following markers: CA-125 Results of the tumor marker test revealed 20.8   12/12/2018 Tumor Marker   Patient's tumor was tested for the following markers: CA-125 Results of the tumor marker test revealed 19.7   01/01/2019 Tumor Marker   Patient's tumor was tested for the following markers: CA-125 Results of the  tumor marker test revealed 21.6   01/22/2019 Tumor Marker   Patient's tumor was tested for the following markers: CA-125 Results of the tumor marker test revealed 21.3   02/12/2019 Tumor Marker   Patient's tumor was tested for the following markers: CA-125 Results of the tumor marker test revealed 21.6   04/16/2019 Tumor Marker   Patient's tumor was tested for the following markers: CA-125 Results of the tumor marker test revealed 21.7   05/07/2019 Tumor Marker   Patient's tumor was tested for the following markers: CA-125 Results of the tumor marker test revealed 27.3   05/28/2019 Tumor Marker   Patient's tumor was tested for the following markers: CA-125 Results of the tumor marker test revealed 24.5   07/09/2019 - 08/19/2019 Chemotherapy   The patient had bevacizumab for chemotherapy treatment.     07/09/2019 Tumor Marker   Patient's tumor was tested for the following markers: CA-125 Results of the tumor marker test revealed 23.3.   08/06/2019 Imaging   Ct abdomen and pelvis 1. New hypodense lesions of the left lobe of the liver measuring 2.5 x 2.5 cm (series 2, image 16) and right lobe of the liver measuring 1.1 x 1.1 cm (series 2, image 23), concerning for metastatic disease.  2. Interval enlargement of an irregular nodule of the omentum measuring 1.0 x 1.0 cm, previously 1.0 x 0.5 cm (series 2, image 51), concerning for peritoneal disease.   3.  Status post hysterectomy.   4. Other chronic and incidental findings as detailed above. Aortic Atherosclerosis (ICD10-I70.0).   08/16/2019 - 01/17/2020 Chemotherapy   The patient had weekly Taxol for chemotherapy treatment.     08/23/2019 Tumor Marker   Patient's tumor was tested for the following markers: CA-125 Results of the tumor marker test revealed 33.7.   09/06/2019 Tumor Marker   Patient's tumor was tested for the following markers: CA-125 Results of the tumor marker test revealed 32.2   10/14/2019 Imaging   1. Interval  decrease in hypodense masses of the left lobe and right lobe of the liver, mass in the left lobe measuring 1.7 x 1.6 cm, previously 2.5 x 2.5 cm (series 2, image 14) and in the right lobe of the liver measuring 0.8 x 0.7 cm, previously 1.1 x 1.1 cm (series 2, image 22).   2. Interval decrease in size of an omental nodule, measuring 0.8 x 0.7 cm, previously 1.0 x 1.0 cm (series 2, image 55).   3. Findings are consistent with improved metastatic disease. No new evidence of metastatic disease in the abdomen or pelvis.   4.  Status post hysterectomy.   5.  Cholelithiasis.   6.  Aortic Atherosclerosis (ICD10-I70.0).   01/23/2020 Imaging   1. 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 2. Slight increase in size of left lower quadrant omental soft tissue nodule, consistent with metastatic disease. 3. No new sites of metastatic disease identified. 4. Stable cholelithiasis and gallbladder wall thickening. No evidence of acute cholecystitis. 5. Colonic diverticulosis. No radiographic evidence of diverticulitis. 6. Tiny hiatal hernia.   01/28/2020 Echocardiogram    1. Left ventricular ejection fraction, by estimation, is 60 to 65%. The left ventricle has normal function. The left ventricle has no regional wall motion abnormalities. Left ventricular diastolic parameters are consistent with Grade I diastolic dysfunction (impaired relaxation). The average left ventricular global longitudinal strain is -19.7 % (normal), however this may be underestimated given suboptimal tracking.  2. Right ventricular systolic function is normal. The right ventricular size is normal.  3. The mitral valve is normal in structure. Mild mitral valve regurgitation. No evidence of mitral stenosis.  4. The aortic valve is normal in structure. Aortic valve regurgitation is not visualized. No aortic stenosis is present.  5. The inferior vena cava is normal in size with greater than 50% respiratory variability, suggesting  right atrial pressure of 3 mmHg.   Conclusion(s)/Recommendation(s): Normal biventricular function without evidence of hemodynamically significant valvular heart disease   01/31/2020 - 04/23/2020 Chemotherapy   The patient had DOXOrubicin for chemotherapy treatment.     04/16/2020 Imaging   1. Mild increase in size of metastasis involving the left hepatic lobe. 2. Stable small left lower quadrant omental soft tissue nodule, suspicious for peritoneal metastasis. 3. No new sites of metastatic disease identified. 4. Cholelithiasis. No radiographic evidence of cholecystitis. 5. Colonic diverticulosis. No radiographic evidence of diverticulitis. 6. Small hiatal hernia.   Aortic Atherosclerosis (ICD10-I70.0).   05/01/2020 -  Chemotherapy   The patient had gemcitabine for chemotherapy treatment.       REVIEW OF SYSTEMS:   Constitutional: Denies fevers, chills or abnormal weight loss Eyes: Denies blurriness of vision Ears, nose, mouth, throat, and face: Denies mucositis or sore throat Respiratory: Denies cough, dyspnea or  wheezes Cardiovascular: Denies palpitation, chest discomfort or lower extremity swelling Skin: Denies abnormal skin rashes Lymphatics: Denies new lymphadenopathy or easy bruising Neurological:Denies numbness, tingling or new weaknesses Behavioral/Psych: Mood is stable, no new changes  All other systems were reviewed with the patient and are negative.  I have reviewed the past medical history, past surgical history, social history and family history with the patient and they are unchanged from previous note.  ALLERGIES:  is allergic to carboplatin.  MEDICATIONS:  Current Outpatient Medications  Medication Sig Dispense Refill   amLODipine (NORVASC) 10 MG tablet Take 1 tablet (10 mg total) by mouth daily. 90 tablet 3   atenolol (TENORMIN) 25 MG tablet Take 1 tablet (25 mg total) by mouth daily. 30 tablet 9   atorvastatin (LIPITOR) 10 MG tablet Take 1 tablet (10 mg  total) by mouth daily. 30 tablet 2   Coenzyme Q10 (CO Q 10) 100 MG CAPS Take 1 capsule by mouth daily.      doxycycline (VIBRAMYCIN) 50 MG capsule Take 1 capsule (50 mg total) by mouth 2 (two) times daily. 14 capsule 0   Glucosamine-Chondroit-Vit C-Mn (GLUCOSAMINE 1500 COMPLEX PO) Take 1 capsule by mouth 2 (two) times daily.     lidocaine-prilocaine (EMLA) cream Apply to affected area once (Patient not taking: Reported on 11/14/2019) 30 g 3   loratadine (CLARITIN) 10 MG tablet Take 10 mg by mouth daily as needed for allergies (hives).     Multiple Vitamin (MULTIVITAMIN) capsule Take 1 capsule by mouth daily.      Omega-3 Fatty Acids (OMEGA-3 FISH OIL) 300 MG CAPS Take 1 capsule by mouth daily.      ondansetron (ZOFRAN) 8 MG tablet Take 1 tablet (8 mg total) by mouth every 8 (eight) hours as needed for refractory nausea / vomiting. 30 tablet 1   Polyethyl Glycol-Propyl Glycol (SYSTANE ULTRA OP) Apply 1 drop to eye 2 (two) times daily at 10 AM and 5 PM.     Probiotic Product (PROBIOTIC ADVANCED PO) Take 1 capsule by mouth daily.     prochlorperazine (COMPAZINE) 10 MG tablet Take 1 tablet (10 mg total) by mouth every 6 (six) hours as needed (Nausea or vomiting). 60 tablet 1   vitamin E 400 UNIT capsule Take 400 Units by mouth every evening.      No current facility-administered medications for this visit.    PHYSICAL EXAMINATION: ECOG PERFORMANCE STATUS: 1 - Symptomatic but completely ambulatory  Vitals:   05/08/20 1150  BP: (!) 168/76  Pulse: 72  Resp: 18  Temp: 97.6 F (36.4 C)  SpO2: 100%   Filed Weights   05/08/20 1150  Weight: 140 lb 9.6 oz (63.8 kg)    GENERAL:alert, no distress and comfortable NEURO: alert & oriented x 3 with fluent speech, no focal motor/sensory deficits  LABORATORY DATA:  I have reviewed the data as listed    Component Value Date/Time   NA 142 05/08/2020 1142   NA 142 11/28/2016 0916   K 3.9 05/08/2020 1142   K 3.9 11/28/2016 0916   CL  110 05/08/2020 1142   CO2 22 05/08/2020 1142   CO2 28 11/28/2016 0916   GLUCOSE 94 05/08/2020 1142   GLUCOSE 78 11/28/2016 0916   BUN 21 05/08/2020 1142   BUN 16.8 11/28/2016 0916   CREATININE 0.79 05/08/2020 1142   CREATININE 0.8 11/28/2016 0916   CALCIUM 9.5 05/08/2020 1142   CALCIUM 10.5 (H) 11/28/2016 0916   PROT 6.5 05/08/2020 1142   PROT 7.3  11/28/2016 0916   ALBUMIN 3.6 05/08/2020 1142   ALBUMIN 4.3 11/28/2016 0916   AST 37 05/08/2020 1142   AST 19 11/28/2016 0916   ALT 21 05/08/2020 1142   ALT 19 11/28/2016 0916   ALKPHOS 60 05/08/2020 1142   ALKPHOS 50 11/28/2016 0916   BILITOT 0.5 05/08/2020 1142   BILITOT 0.48 11/28/2016 0916   GFRNONAA >60 05/08/2020 1142   GFRAA >60 05/08/2020 1142    No results found for: SPEP, UPEP  Lab Results  Component Value Date   WBC 1.8 (L) 05/08/2020   NEUTROABS 0.6 (L) 05/08/2020   HGB 10.0 (L) 05/08/2020   HCT 30.0 (L) 05/08/2020   MCV 93.8 05/08/2020   PLT 64 (L) 05/08/2020      Chemistry      Component Value Date/Time   NA 142 05/08/2020 1142   NA 142 11/28/2016 0916   K 3.9 05/08/2020 1142   K 3.9 11/28/2016 0916   CL 110 05/08/2020 1142   CO2 22 05/08/2020 1142   CO2 28 11/28/2016 0916   BUN 21 05/08/2020 1142   BUN 16.8 11/28/2016 0916   CREATININE 0.79 05/08/2020 1142   CREATININE 0.8 11/28/2016 0916      Component Value Date/Time   CALCIUM 9.5 05/08/2020 1142   CALCIUM 10.5 (H) 11/28/2016 0916   ALKPHOS 60 05/08/2020 1142   ALKPHOS 50 11/28/2016 0916   AST 37 05/08/2020 1142   AST 19 11/28/2016 0916   ALT 21 05/08/2020 1142   ALT 19 11/28/2016 0916   BILITOT 0.5 05/08/2020 1142   BILITOT 0.48 11/28/2016 0916

## 2020-05-08 NOTE — Telephone Encounter (Signed)
Called and given below message. She verbalized understanding. 

## 2020-05-08 NOTE — Assessment & Plan Note (Addendum)
She has profound pancytopenia despite upfront dose adjustment I plan to space out her treatment to every other week If her blood count next week showed persistent pancytopenia, I plan to reduce the dose of gemcitabine further to 600 mg/m She does not need transfusion support We discussed risk of neutropenia and I recommend neutropenic precaution I will check iron studies and vitamin B12 levels just to be sure this is not due to nutritional deficiency It is very likely due to chemotherapy

## 2020-05-08 NOTE — Assessment & Plan Note (Signed)
Observe for now Her liver enzymes remain preserved

## 2020-05-08 NOTE — Telephone Encounter (Signed)
Scheduled per 6/18 sch message. Pt is aware of appts on 6/28. Ok per Dr. Alvy Bimler for pt to start this day due to availiabilty in infusion.

## 2020-05-18 ENCOUNTER — Other Ambulatory Visit: Payer: Self-pay

## 2020-05-18 ENCOUNTER — Inpatient Hospital Stay: Payer: Medicare PPO

## 2020-05-18 ENCOUNTER — Other Ambulatory Visit: Payer: Self-pay | Admitting: Hematology and Oncology

## 2020-05-18 VITALS — BP 121/42 | HR 58 | Temp 98.1°F | Resp 18

## 2020-05-18 DIAGNOSIS — Z7189 Other specified counseling: Secondary | ICD-10-CM

## 2020-05-18 DIAGNOSIS — Z5111 Encounter for antineoplastic chemotherapy: Secondary | ICD-10-CM | POA: Diagnosis not present

## 2020-05-18 DIAGNOSIS — C561 Malignant neoplasm of right ovary: Secondary | ICD-10-CM

## 2020-05-18 LAB — CBC WITH DIFFERENTIAL (CANCER CENTER ONLY)
Abs Immature Granulocytes: 0.01 10*3/uL (ref 0.00–0.07)
Basophils Absolute: 0 10*3/uL (ref 0.0–0.1)
Basophils Relative: 1 %
Eosinophils Absolute: 0.3 10*3/uL (ref 0.0–0.5)
Eosinophils Relative: 9 %
HCT: 31.4 % — ABNORMAL LOW (ref 36.0–46.0)
Hemoglobin: 10.4 g/dL — ABNORMAL LOW (ref 12.0–15.0)
Immature Granulocytes: 0 %
Lymphocytes Relative: 27 %
Lymphs Abs: 0.8 10*3/uL (ref 0.7–4.0)
MCH: 31.9 pg (ref 26.0–34.0)
MCHC: 33.1 g/dL (ref 30.0–36.0)
MCV: 96.3 fL (ref 80.0–100.0)
Monocytes Absolute: 0.4 10*3/uL (ref 0.1–1.0)
Monocytes Relative: 15 %
Neutro Abs: 1.5 10*3/uL — ABNORMAL LOW (ref 1.7–7.7)
Neutrophils Relative %: 48 %
Platelet Count: 169 10*3/uL (ref 150–400)
RBC: 3.26 MIL/uL — ABNORMAL LOW (ref 3.87–5.11)
RDW: 15.9 % — ABNORMAL HIGH (ref 11.5–15.5)
WBC Count: 3 10*3/uL — ABNORMAL LOW (ref 4.0–10.5)
nRBC: 0 % (ref 0.0–0.2)

## 2020-05-18 LAB — CMP (CANCER CENTER ONLY)
ALT: 19 U/L (ref 0–44)
AST: 29 U/L (ref 15–41)
Albumin: 3.6 g/dL (ref 3.5–5.0)
Alkaline Phosphatase: 51 U/L (ref 38–126)
Anion gap: 13 (ref 5–15)
BUN: 16 mg/dL (ref 8–23)
CO2: 21 mmol/L — ABNORMAL LOW (ref 22–32)
Calcium: 9.2 mg/dL (ref 8.9–10.3)
Chloride: 110 mmol/L (ref 98–111)
Creatinine: 0.84 mg/dL (ref 0.44–1.00)
GFR, Est AFR Am: >60 mL/min (ref >60)
GFR, Estimated: >60 mL/min (ref >60)
Glucose, Bld: 97 mg/dL (ref 70–99)
Potassium: 3.8 mmol/L (ref 3.5–5.1)
Sodium: 144 mmol/L (ref 135–145)
Total Bilirubin: 0.3 mg/dL (ref 0.3–1.2)
Total Protein: 6.2 g/dL — ABNORMAL LOW (ref 6.5–8.1)

## 2020-05-18 MED ORDER — HEPARIN SOD (PORK) LOCK FLUSH 100 UNIT/ML IV SOLN
500.0000 [IU] | Freq: Once | INTRAVENOUS | Status: AC | PRN
  Administered 2020-05-18: 500 [IU]
  Filled 2020-05-18: qty 5

## 2020-05-18 MED ORDER — SODIUM CHLORIDE 0.9 % IV SOLN
Freq: Once | INTRAVENOUS | Status: AC
  Filled 2020-05-18: qty 250

## 2020-05-18 MED ORDER — SODIUM CHLORIDE 0.9% FLUSH
10.0000 mL | INTRAVENOUS | Status: DC | PRN
  Administered 2020-05-18: 10 mL
  Filled 2020-05-18: qty 10

## 2020-05-18 MED ORDER — PROCHLORPERAZINE MALEATE 10 MG PO TABS
ORAL_TABLET | ORAL | Status: AC
  Filled 2020-05-18: qty 1

## 2020-05-18 MED ORDER — PROCHLORPERAZINE MALEATE 10 MG PO TABS
10.0000 mg | ORAL_TABLET | Freq: Once | ORAL | Status: AC
  Administered 2020-05-18: 10 mg via ORAL

## 2020-05-18 MED ORDER — SODIUM CHLORIDE 0.9 % IV SOLN
600.0000 mg/m2 | Freq: Once | INTRAVENOUS | Status: AC
  Administered 2020-05-18: 988 mg via INTRAVENOUS
  Filled 2020-05-18: qty 25.98

## 2020-05-18 NOTE — Patient Instructions (Signed)
[facility] Discharge Instructions for Patients Receiving Chemotherapy  Today you received the following chemotherapy agents: gemcitabine.  To help prevent nausea and vomiting after your treatment, we encourage you to take your nausea medication as directed.   If you develop nausea and vomiting that is not controlled by your nausea medication, call the clinic.   BELOW ARE SYMPTOMS THAT SHOULD BE REPORTED IMMEDIATELY:  *FEVER GREATER THAN 100.5 F  *CHILLS WITH OR WITHOUT FEVER  NAUSEA AND VOMITING THAT IS NOT CONTROLLED WITH YOUR NAUSEA MEDICATION  *UNUSUAL SHORTNESS OF BREATH  *UNUSUAL BRUISING OR BLEEDING  TENDERNESS IN MOUTH AND THROAT WITH OR WITHOUT PRESENCE OF ULCERS  *URINARY PROBLEMS  *BOWEL PROBLEMS  UNUSUAL RASH Items with * indicate a potential emergency and should be followed up as soon as possible.  Feel free to call the clinic should you have any questions or concerns. The clinic phone number is [phone].  Please show the CHEMO ALERT CARD at check-in to the Emergency Department and triage nurse.  Gemcitabine injection What is this medicine? GEMCITABINE (jem SYE ta been) is a chemotherapy drug. This medicine is used to treat many types of cancer like breast cancer, lung cancer, pancreatic cancer, and ovarian cancer. This medicine may be used for other purposes; ask your health care provider or pharmacist if you have questions. COMMON BRAND NAME(S): Gemzar, Infugem What should I tell my health care provider before I take this medicine? They need to know if you have any of these conditions:  blood disorders  infection  kidney disease  liver disease  lung or breathing disease, like asthma  recent or ongoing radiation therapy  an unusual or allergic reaction to gemcitabine, other chemotherapy, other medicines, foods, dyes, or preservatives  pregnant or trying to get pregnant  breast-feeding How should I use this medicine? This  drug is given as an infusion into a vein. It is administered in a hospital or clinic by a specially trained health care professional. Talk to your pediatrician regarding the use of this medicine in children. Special care may be needed. Overdosage: If you think you have taken too much of this medicine contact a poison control center or emergency room at once. NOTE: This medicine is only for you. Do not share this medicine with others. What if I miss a dose? It is important not to miss your dose. Call your doctor or health care professional if you are unable to keep an appointment. What may interact with this medicine?  medicines to increase blood counts like filgrastim, pegfilgrastim, sargramostim  some other chemotherapy drugs like cisplatin  vaccines Talk to your doctor or health care professional before taking any of these medicines:  acetaminophen  aspirin  ibuprofen  ketoprofen  naproxen This list may not describe all possible interactions. Give your health care provider a list of all the medicines, herbs, non-prescription drugs, or dietary supplements you use. Also tell them if you smoke, drink alcohol, or use illegal drugs. Some items may interact with your medicine. What should I watch for while using this medicine? Visit your doctor for checks on your progress. This drug may make you feel generally unwell. This is not uncommon, as chemotherapy can affect healthy cells as well as cancer cells. Report any side effects. Continue your course of treatment even though you feel ill unless your doctor tells you to stop. In some cases, you may be given additional medicines to help with side effects. Follow all directions for their use. Call   your doctor or health care professional for advice if you get a fever, chills or sore throat, or other symptoms of a cold or flu. Do not treat yourself. This drug decreases your body's ability to fight infections. Try to avoid being around people who  are sick. This medicine may increase your risk to bruise or bleed. Call your doctor or health care professional if you notice any unusual bleeding. Be careful brushing and flossing your teeth or using a toothpick because you may get an infection or bleed more easily. If you have any dental work done, tell your dentist you are receiving this medicine. Avoid taking products that contain aspirin, acetaminophen, ibuprofen, naproxen, or ketoprofen unless instructed by your doctor. These medicines may hide a fever. Do not become pregnant while taking this medicine or for 6 months after stopping it. Women should inform their doctor if they wish to become pregnant or think they might be pregnant. Men should not father a child while taking this medicine and for 3 months after stopping it. There is a potential for serious side effects to an unborn child. Talk to your health care professional or pharmacist for more information. Do not breast-feed an infant while taking this medicine or for at least 1 week after stopping it. Men should inform their doctors if they wish to father a child. This medicine may lower sperm counts. Talk with your doctor or health care professional if you are concerned about your fertility. What side effects may I notice from receiving this medicine? Side effects that you should report to your doctor or health care professional as soon as possible:  allergic reactions like skin rash, itching or hives, swelling of the face, lips, or tongue  breathing problems  pain, redness, or irritation at site where injected  signs and symptoms of a dangerous change in heartbeat or heart rhythm like chest pain; dizziness; fast or irregular heartbeat; palpitations; feeling faint or lightheaded, falls; breathing problems  signs of decreased platelets or bleeding - bruising, pinpoint red spots on the skin, black, tarry stools, blood in the urine  signs of decreased red blood cells - unusually weak or  tired, feeling faint or lightheaded, falls  signs of infection - fever or chills, cough, sore throat, pain or difficulty passing urine  signs and symptoms of kidney injury like trouble passing urine or change in the amount of urine  signs and symptoms of liver injury like dark yellow or brown urine; general ill feeling or flu-like symptoms; light-colored stools; loss of appetite; nausea; right upper belly pain; unusually weak or tired; yellowing of the eyes or skin  swelling of ankles, feet, hands Side effects that usually do not require medical attention (report to your doctor or health care professional if they continue or are bothersome):  constipation  diarrhea  hair loss  loss of appetite  nausea  rash  vomiting This list may not describe all possible side effects. Call your doctor for medical advice about side effects. You may report side effects to FDA at 1-800-FDA-1088. Where should I keep my medicine? This drug is given in a hospital or clinic and will not be stored at home. NOTE: This sheet is a summary. It may not cover all possible information. If you have questions about this medicine, talk to your doctor, pharmacist, or health care provider.  2020 [employer] (2018-01-31 18:06:11)  

## 2020-05-18 NOTE — Progress Notes (Signed)
Decrease today's gemcitabine to 600mg /m2 per Dr Alvy Bimler

## 2020-05-20 ENCOUNTER — Encounter: Payer: Self-pay | Admitting: Gynecologic Oncology

## 2020-05-20 ENCOUNTER — Other Ambulatory Visit: Payer: Self-pay

## 2020-05-20 ENCOUNTER — Inpatient Hospital Stay (HOSPITAL_BASED_OUTPATIENT_CLINIC_OR_DEPARTMENT_OTHER): Payer: Medicare PPO | Admitting: Gynecologic Oncology

## 2020-05-20 VITALS — BP 131/55 | HR 66 | Temp 98.9°F | Resp 18 | Ht 62.2 in | Wt 140.8 lb

## 2020-05-20 DIAGNOSIS — C561 Malignant neoplasm of right ovary: Secondary | ICD-10-CM

## 2020-05-20 DIAGNOSIS — C787 Secondary malignant neoplasm of liver and intrahepatic bile duct: Secondary | ICD-10-CM | POA: Diagnosis not present

## 2020-05-20 DIAGNOSIS — Z5111 Encounter for antineoplastic chemotherapy: Secondary | ICD-10-CM | POA: Diagnosis not present

## 2020-05-20 NOTE — Patient Instructions (Signed)
Dr Denman George will discuss [facility] clinical trials with [facility]. She will discuss consideration for tumor testing for PDL1 and HRD/BRCA mutations and consideration for hormone therapy/immune therapy/PARP inhibitor therapy.  [facility] often has many good clinical trials for ovarian cancer.  Dr Denman George can be reached at [phone].

## 2020-05-20 NOTE — Progress Notes (Signed)
GYN ONCOLOGY OFFICE VISIT    CHIEF COMPLAINT: Ovarian cancer, platinum resistant progressive disease, BRCA negative. Pancytopenia on Gemzar.  Second opinion regarding treatment options.   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  I discussed with Emily Hull and her husband the status of her disease.  I explained that I agree with Dr. Calton Dach choices and chemotherapy regimen.  We explained potential Therapies both in the cytotoxic realm, as well as targeted therapies.  She may be a candidate for a PARP inhibitor oral therapy, versus a antiestrogen treatment (given her tumors original ER status), or consideration for PD-L1 testing of her original tumor for the consideration of pembrolizumab.  I also discussed with Emily Hull that she may be a good candidate at present for consideration of clinical trial given that she has excellent performance status and has had 4 prior lines of therapy.  I explained to Emily Hull that as cancer progresses and performance status declines she will no longer become a candidate for most clinical trials, additionally it is rare for clinical trials to accept patients with more than 4-5 prior regimens.  She will continue gemcitabine if tolerated based on her blood counts,.  She will consider fifth line salvage therapy with palliative intent should and when she progresses.  HPI:  See below for oncology history specifics. In summary, Emily Hull is a 79 year old woman whose ovarian cancer (clinical stage II, high-grade serous, BRCA negative, estrogen receptor positive) was diagnosed in 2016.  This was an incidental diagnosis following a hysterectomy and BSO performed by Dr. Evette Cristal for postmenopausal bleeding.   She was then treated with carboplatin and paclitaxel chemotherapy as adjuvant treatment and tolerated this well.  She did 2-year disease-free interval after which time she developed a  asymptomatic abdominal pelvic recurrence confirmed on CT scan.  This was in 2019.  She was treated with carboplatin paclitaxel salvage chemotherapy with bevacizumab followed by bevacizumab maintenance therapy.  Cytotoxic salvage treatment was completed in July 2019 with complete clinical response.  Bevacizumab continued until September 2020 when recurrence was identified on CT scan imaging and elevation Ca1 25.  At that time she was started on weekly Taxol which she received and tolerated well until February 2021, discontinued due to progression of disease.  She had minimal toxicity from weekly Taxol including no permanent neuropathy.  In March 2021 she commenced on pegylated doxorubicin chemotherapy which she tolerated very well.  Unfortunately progression was documented in her hepatic metastasis in May 2021 at which time she was changed to fourth line chemotherapy with gemcitabine.  Treatment with gemcitabine was complicated by pancytopenia requiring dose delays and reduction.  CT scan of the abdomen and pelvis performed on Apr 16, 2020 prior to commencing gemcitabine revealed her metastatic disease to be predominantly in the liver and left lower quadrant.  The liver metastasis measured 3.4 x 3.1 cm in the central left lobe.  The other site of measurable metastatic disease within the soft tissue nodule in the anterior left lower quadrant measuring 1.3 cm x 0.8 cm.  The patient has an excellent performance status, ECOG 0-1, and is tolerating therapy very well with no substantial symptomatic toxicities.  She maintains an active lifestyle including exercise, well-balanced diet avoiding red meat and processed foods, and enjoys time with her husband and children and grandchildren.  Her goals of care are treatment to slow the progression of disease, and she favors aggressive therapies with associated toxicities over palliative strategies with minimal toxicities.  Emily Hull expresses an understanding of the  prognosis of her disease, including understanding that is not curable, and that life expectancy is in the order of months rather than years.  She is somewhat ambivalent about clinical trials.  Oncology History Overview Note  Neg genetics. Additional tests revealed ER: 80%, PR 3% MSI stable Progressed on Avastin, Doxil and Taxol Allergic to carboplatin   Right ovarian epithelial cancer ([place])  07/01/2015 Initial Diagnosis   She presented with postmenopausal bleeding   07/02/2015 Imaging   US pelvis showed bilateral ovarian cyst   07/13/2015 Imaging   5.2 cm left adnexal cystic lesion, with indeterminate but probably benign characteristics. In a postmenopausal female, consider continued annual imaging followup with CT or MRI versus surgical evaluation.  Colonic diverticulosis. No radiographic evidence of diverticulitis.  Cholelithiasis.  No radiographic evidence of cholecystitis.  Small hiatal hernia.   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  The tumor involves serosal surface of the right ovary and adjacent right fallopian tube.  Cervix and endometrium were within normal limits. ER positive Peritoneal washing was positive.   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   08/13/2015 Tumor Marker   Patient's tumor was tested for the following markers: CA-125 Results of the tumor marker test revealed 96   08/25/2015 - 12/08/2015 Chemotherapy   The patient had 6 cycles of carboplatin and taxol   09/07/2015 Tumor Marker   Patient's tumor was tested for the following markers: CA-125 Results of the tumor marker test revealed 51   10/05/2015 Tumor Marker   Patient's tumor was  tested for the following markers: CA-125 Results of the tumor marker test revealed 29   11/09/2015 Tumor Marker   Patient's tumor was tested for the following markers: CA-125 Results of the tumor marker test revealed 44   12/08/2015 Tumor Marker   Patient's tumor was tested for the following markers: CA-125 Results of the tumor marker test revealed 30   12/15/2015 Genetic Testing   Genetics testing normal by [facility] Breast Ovarian panel 12-15-15   01/11/2016 Imaging   1. The only finding of note are several adjacent peripheral abnormal hypodensities along the anterior superior spleen margin, differential diagnostic considerations including small peripheral interval splenic infarcts, splenic injury with subcapsular a fluid collections, or less likely metastatic disease to the splenic margin. This likely merits observation. 2. Other imaging findings of potential clinical significance: Small type 1 hiatal hernia. Aortoiliac atherosclerotic vascular disease. Lower lumbar spondylosis and degenerative disc disease. Gallstones with potential mild gallbladder wall thickening.   03/07/2016 Tumor Marker   Patient's tumor was tested for the following markers: CA-125 Results of the tumor marker test revealed 24.2   04/06/2016 Imaging   1. No evidence of a ovarian cancer metastasis. 2. No ascites. 3. Post hysterectomy and oophorectomy. 4. Cholelithiasis with several large gallstones   04/06/2016 Tumor Marker   Patient's tumor was tested for the following markers: CA-125 Results of the tumor marker test revealed 22.8   05/30/2016 Tumor Marker   Patient's tumor was tested for the following markers: CA-125 Results of the tumor marker test revealed 21   09/07/2016 Tumor Marker   Patient's tumor was tested for the following markers: CA-125 Results of the tumor marker test revealed 22.4  11/28/2016 Tumor Marker   Patient's tumor was tested for the following markers: CA-125 Results of the tumor marker  test revealed 18.8   02/23/2017 Tumor Marker   Patient's tumor was tested for the following markers: CA-125 Results of the tumor marker test revealed 19.1   06/02/2017 Tumor Marker   Patient's tumor was tested for the following markers: CA-125 Results of the tumor marker test revealed 18.3   08/24/2017 Mammogram   Pt reports mammogram complete   08/28/2017 Tumor Marker   Patient's tumor was tested for the following markers: CA-125 Results of the tumor marker test revealed 21.1   12/01/2017 Tumor Marker   Patient's tumor was tested for the following markers: CA-125 Results of the tumor marker test revealed 31.2   12/13/2017 Imaging   New 2.7 cm peritoneal soft tissue mass in anterior left lower quadrant, consistent with metastatic disease.  No other sites of metastatic disease identified.  Incidental findings including: Cholelithiasis. Colonic diverticulosis. Tiny hiatal hernia. Aortic atherosclerosis.   01/09/2018 - 05/07/2018 Chemotherapy   The patient had carboplatin and taxol; After 2nd cycle, she developed carboplatin allergy. She received carboplatin desensitization protocol at [facility] for final 4 cycles, completed by 6/17. Avastin was added from 04/16/18 onwards   01/30/2018 Adverse Reaction   Potential carboplatin allergy is suspected. She received half the dose of prescribed carboplatin on cycle 2   05/31/2018 Tumor Marker   Patient's tumor was tested for the following markers: CA-125 Results of the tumor marker test revealed 26   05/31/2018 Imaging   1. Peritoneal soft tissue nodule identified on the previous study has decreased in the interval the. No progressive findings in the abdomen or pelvis on today's study to suggest disease progression. 2. Tiny pulmonary nodules, likely benign. Attention on follow-up recommended. 3. Cholelithiasis. 4.  Aortic Atherosclerois (ICD10-170.0) 5. Tiny hiatal hernia.     06/04/2018 - 07/30/2019 Chemotherapy   The patient is placed on  Avastin for maintenance   06/25/2018 Tumor Marker   Patient's tumor was tested for the following markers: CA-125 Results of the tumor marker test revealed 22.5   08/06/2018 Tumor Marker   Patient's tumor was tested for the following markers: CA-125 Results of the tumor marker test revealed 20.7   09/14/2018 Imaging   Status post hysterectomy and bilateral salpingo-oophorectomy.  Stable soft tissue nodule beneath the left lower anterior abdominal wall, likely reflecting stable peritoneal disease.  Scattered small subpleural nodules in the lungs bilaterally, measuring up to 3 mm, technically indeterminate although likely benign. Please note that Fleischner Society guidelines do not apply. Attention on follow-up is suggested.  No evidence of new/progressive metastatic disease.   09/14/2018 Tumor Marker   Patient's tumor was tested for the following markers: CA-125 Results of the tumor marker test revealed 21.3   10/30/2018 Tumor Marker   Patient's tumor was tested for the following markers: CA-125 Results of the tumor marker test revealed 20.8   12/12/2018 Tumor Marker   Patient's tumor was tested for the following markers: CA-125 Results of the tumor marker test revealed 19.7   01/01/2019 Tumor Marker   Patient's tumor was tested for the following markers: CA-125 Results of the tumor marker test revealed 21.6   01/22/2019 Tumor Marker   Patient's tumor was tested for the following markers: CA-125 Results of the tumor marker test revealed 21.3   02/12/2019 Tumor Marker   Patient's tumor was tested for the following markers: CA-125 Results of the tumor marker test revealed 21.6   04/16/2019  Tumor Marker   Patient's tumor was tested for the following markers: CA-125 Results of the tumor marker test revealed 21.7   05/07/2019 Tumor Marker   Patient's tumor was tested for the following markers: CA-125 Results of the tumor marker test revealed 27.3   05/28/2019 Tumor Marker    Patient's tumor was tested for the following markers: CA-125 Results of the tumor marker test revealed 24.5   07/09/2019 - 08/19/2019 Chemotherapy   The patient had bevacizumab for chemotherapy treatment.     07/09/2019 Tumor Marker   Patient's tumor was tested for the following markers: CA-125 Results of the tumor marker test revealed 23.3.   08/06/2019 Imaging   Ct abdomen and pelvis 1. New hypodense lesions of the left lobe of the liver measuring 2.5 x 2.5 cm (series 2, image 16) and right lobe of the liver measuring 1.1 x 1.1 cm (series 2, image 23), concerning for metastatic disease.   2. Interval enlargement of an irregular nodule of the omentum measuring 1.0 x 1.0 cm, previously 1.0 x 0.5 cm (series 2, image 51), concerning for peritoneal disease.   3.  Status post hysterectomy.   4. Other chronic and incidental findings as detailed above. Aortic Atherosclerosis (ICD10-I70.0).   08/16/2019 - 01/17/2020 Chemotherapy   The patient had weekly Taxol for chemotherapy treatment.     08/23/2019 Tumor Marker   Patient's tumor was tested for the following markers: CA-125 Results of the tumor marker test revealed 33.7.   09/06/2019 Tumor Marker   Patient's tumor was tested for the following markers: CA-125 Results of the tumor marker test revealed 32.2   10/14/2019 Imaging   1. Interval decrease in hypodense masses of the left lobe and right lobe of the liver, mass in the left lobe measuring 1.7 x 1.6 cm, previously 2.5 x 2.5 cm (series 2, image 14) and in the right lobe of the liver measuring 0.8 x 0.7 cm, previously 1.1 x 1.1 cm (series 2, image 22).   2. Interval decrease in size of an omental nodule, measuring 0.8 x 0.7 cm, previously 1.0 x 1.0 cm (series 2, image 55).   3. Findings are consistent with improved metastatic disease. No new evidence of metastatic disease in the abdomen or pelvis.   4.  Status post hysterectomy.   5.  Cholelithiasis.   6.  Aortic Atherosclerosis  (ICD10-I70.0).   01/23/2020 Imaging   1. 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 2. Slight increase in size of left lower quadrant omental soft tissue nodule, consistent with metastatic disease. 3. No new sites of metastatic disease identified. 4. Stable cholelithiasis and gallbladder wall thickening. No evidence of acute cholecystitis. 5. Colonic diverticulosis. No radiographic evidence of diverticulitis. 6. Tiny hiatal hernia.   01/28/2020 Echocardiogram    1. Left ventricular ejection fraction, by estimation, is 60 to 65%. The left ventricle has normal function. The left ventricle has no regional wall motion abnormalities. Left ventricular diastolic parameters are consistent with Grade I diastolic dysfunction (impaired relaxation). The average left ventricular global longitudinal strain is -19.7 % (normal), however this may be underestimated given suboptimal tracking.  2. Right ventricular systolic function is normal. The right ventricular size is normal.  3. The mitral valve is normal in structure. Mild mitral valve regurgitation. No evidence of mitral stenosis.  4. The aortic valve is normal in structure. Aortic valve regurgitation is not visualized. No aortic stenosis is present.  5. The inferior vena cava is normal in size with greater  than 50% respiratory variability, suggesting right atrial pressure of 3 mmHg.   Conclusion(s)/Recommendation(s): Normal biventricular function without evidence of hemodynamically significant valvular heart disease   01/31/2020 - 04/23/2020 Chemotherapy   The patient had DOXOrubicin for chemotherapy treatment.     04/16/2020 Imaging   1. Mild increase in size of metastasis involving the left hepatic lobe. 2. Stable small left lower quadrant omental soft tissue nodule, suspicious for peritoneal metastasis. 3. No new sites of metastatic disease identified. 4. Cholelithiasis. No radiographic evidence of cholecystitis. 5. Colonic diverticulosis. No  radiographic evidence of diverticulitis. 6. Small hiatal hernia.   Aortic Atherosclerosis (ICD10-I70.0).   05/01/2020 -  Chemotherapy   The patient had gemcitabine for chemotherapy treatment.      Past Medical History:  Diagnosis Date  . Cushing's disease ([place])   . Elevated cholesterol   . Hypertension   . Liver metastases ([place]) dx'd 07/2019  . Osteopenia   . Ovarian cancer ([place]) dx'd 07/2015   Past Surgical History:  Procedure Laterality Date  . IR FLUORO GUIDE PORT INSERTION RIGHT  01/03/2018  . IR US GUIDE VASC ACCESS RIGHT  01/03/2018  . VAGINAL HYSTERECTOMY     Review of Systems  Constitutional: Negative for chills, fever and weight loss.  HENT: Negative for hearing loss and sinus pain.   Eyes: Negative.   Respiratory: Negative.  Negative for stridor.   Cardiovascular: Negative.   Gastrointestinal: Negative for abdominal pain, heartburn, melena and vomiting.  Genitourinary: Negative for dysuria, frequency, hematuria and urgency.  Musculoskeletal: Negative.   Skin: Negative.   Neurological: Negative.   Psychiatric/Behavioral: Negative.    Physical Exam Constitutional:      Appearance: Normal appearance. She is not ill-appearing or diaphoretic.  Neck:     Vascular: No carotid bruit.  Cardiovascular:     Heart sounds: No murmur heard.  No friction rub.  Lymphadenopathy:     Cervical: No cervical adenopathy.  Neurological:     General: No focal deficit present.     Mental Status: She is alert.  Psychiatric:        Mood and Affect: Mood normal.        Behavior: Behavior normal.     60 minutes was spent in face to face consultation with the patient reviewing her complex, 5 year cancer history, treatment regimens, toxicities, and treatment goals, in addition to treatment counseling. An additional 30 minutes of medical history review was spent to abstract data regarding her prior treatments with 3 prior physicians over 5 years.

## 2020-05-22 ENCOUNTER — Other Ambulatory Visit: Payer: Medicare PPO

## 2020-05-22 ENCOUNTER — Ambulatory Visit: Payer: Medicare PPO

## 2020-05-29 ENCOUNTER — Telehealth: Payer: Self-pay | Admitting: Hematology and Oncology

## 2020-05-29 ENCOUNTER — Encounter: Payer: Self-pay | Admitting: Hematology and Oncology

## 2020-05-29 ENCOUNTER — Other Ambulatory Visit: Payer: Self-pay

## 2020-05-29 ENCOUNTER — Inpatient Hospital Stay: Payer: Medicare PPO

## 2020-05-29 ENCOUNTER — Inpatient Hospital Stay: Payer: Medicare PPO | Admitting: Hematology and Oncology

## 2020-05-29 ENCOUNTER — Inpatient Hospital Stay: Payer: Medicare PPO | Attending: Gynecology

## 2020-05-29 DIAGNOSIS — C561 Malignant neoplasm of right ovary: Secondary | ICD-10-CM

## 2020-05-29 DIAGNOSIS — Z5111 Encounter for antineoplastic chemotherapy: Secondary | ICD-10-CM | POA: Diagnosis present

## 2020-05-29 DIAGNOSIS — C787 Secondary malignant neoplasm of liver and intrahepatic bile duct: Secondary | ICD-10-CM | POA: Diagnosis not present

## 2020-05-29 DIAGNOSIS — D61818 Other pancytopenia: Secondary | ICD-10-CM

## 2020-05-29 DIAGNOSIS — Z7189 Other specified counseling: Secondary | ICD-10-CM

## 2020-05-29 LAB — CBC WITH DIFFERENTIAL (CANCER CENTER ONLY)
Abs Immature Granulocytes: 0.01 10*3/uL (ref 0.00–0.07)
Basophils Absolute: 0 10*3/uL (ref 0.0–0.1)
Basophils Relative: 1 %
Eosinophils Absolute: 0.4 10*3/uL (ref 0.0–0.5)
Eosinophils Relative: 10 %
HCT: 28.8 % — ABNORMAL LOW (ref 36.0–46.0)
Hemoglobin: 9.6 g/dL — ABNORMAL LOW (ref 12.0–15.0)
Immature Granulocytes: 0 %
Lymphocytes Relative: 28 %
Lymphs Abs: 1 10*3/uL (ref 0.7–4.0)
MCH: 31.3 pg (ref 26.0–34.0)
MCHC: 33.3 g/dL (ref 30.0–36.0)
MCV: 93.8 fL (ref 80.0–100.0)
Monocytes Absolute: 0.6 10*3/uL (ref 0.1–1.0)
Monocytes Relative: 16 %
Neutro Abs: 1.5 10*3/uL — ABNORMAL LOW (ref 1.7–7.7)
Neutrophils Relative %: 45 %
Platelet Count: 78 10*3/uL — ABNORMAL LOW (ref 150–400)
RBC: 3.07 MIL/uL — ABNORMAL LOW (ref 3.87–5.11)
RDW: 16 % — ABNORMAL HIGH (ref 11.5–15.5)
WBC Count: 3.5 10*3/uL — ABNORMAL LOW (ref 4.0–10.5)
nRBC: 0 % (ref 0.0–0.2)

## 2020-05-29 LAB — CMP (CANCER CENTER ONLY)
ALT: 18 U/L (ref 0–44)
AST: 29 U/L (ref 15–41)
Albumin: 3.6 g/dL (ref 3.5–5.0)
Alkaline Phosphatase: 55 U/L (ref 38–126)
Anion gap: 9 (ref 5–15)
BUN: 20 mg/dL (ref 8–23)
CO2: 25 mmol/L (ref 22–32)
Calcium: 9.4 mg/dL (ref 8.9–10.3)
Chloride: 108 mmol/L (ref 98–111)
Creatinine: 0.78 mg/dL (ref 0.44–1.00)
GFR, Est AFR Am: >60 mL/min (ref >60)
GFR, Estimated: >60 mL/min (ref >60)
Glucose, Bld: 92 mg/dL (ref 70–99)
Potassium: 3.8 mmol/L (ref 3.5–5.1)
Sodium: 142 mmol/L (ref 135–145)
Total Bilirubin: 0.5 mg/dL (ref 0.3–1.2)
Total Protein: 6.4 g/dL — ABNORMAL LOW (ref 6.5–8.1)

## 2020-05-29 MED ORDER — PROCHLORPERAZINE MALEATE 10 MG PO TABS
ORAL_TABLET | ORAL | Status: AC
  Filled 2020-05-29: qty 1

## 2020-05-29 MED ORDER — SODIUM CHLORIDE 0.9 % IV SOLN
600.0000 mg/m2 | Freq: Once | INTRAVENOUS | Status: AC
  Administered 2020-05-29: 988 mg via INTRAVENOUS
  Filled 2020-05-29: qty 25.98

## 2020-05-29 MED ORDER — SODIUM CHLORIDE 0.9 % IV SOLN
Freq: Once | INTRAVENOUS | Status: AC
  Filled 2020-05-29: qty 250

## 2020-05-29 MED ORDER — SODIUM CHLORIDE 0.9% FLUSH
10.0000 mL | INTRAVENOUS | Status: DC | PRN
  Administered 2020-05-29: 10 mL
  Filled 2020-05-29: qty 10

## 2020-05-29 MED ORDER — PROCHLORPERAZINE MALEATE 10 MG PO TABS
10.0000 mg | ORAL_TABLET | Freq: Once | ORAL | Status: AC
  Administered 2020-05-29: 10 mg via ORAL

## 2020-05-29 MED ORDER — SODIUM CHLORIDE 0.9% FLUSH
10.0000 mL | Freq: Once | INTRAVENOUS | Status: AC
  Administered 2020-05-29: 10 mL
  Filled 2020-05-29: qty 10

## 2020-05-29 MED ORDER — HEPARIN SOD (PORK) LOCK FLUSH 100 UNIT/ML IV SOLN
500.0000 [IU] | Freq: Once | INTRAVENOUS | Status: AC | PRN
  Administered 2020-05-29: 500 [IU]
  Filled 2020-05-29: qty 5

## 2020-05-29 NOTE — Assessment & Plan Note (Signed)
We discussed the risk and benefits of treatment and the role of second opinion and tertiary referral She is at peace to continue treatment as scheduled I plan to repeat CT imaging after cycle 3 of therapy

## 2020-05-29 NOTE — Assessment & Plan Note (Signed)
Observe for now Her liver enzymes remain preserved

## 2020-05-29 NOTE — Progress Notes (Signed)
Per Dr. Alvy Bimler: okay to treat with Plt of 78.

## 2020-05-29 NOTE — Progress Notes (Signed)
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[facility]) We discussed the risk and benefits of treatment and the role of second opinion and tertiary referral She is at peace to continue treatment as scheduled I plan to repeat CT imaging after cycle 3 of therapy  Pancytopenia, acquired [facility]) She has intermittent pancytopenia despite dose adjustment We will proceed with treatment today despite pancytopenia We discussed risk of neutropenic precaution and risk of bleeding Moving forward, I will space out her treatment to every other week  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([place]) Observe for now Her liver enzymes remain preserved   No orders of the defined types were placed in this encounter.   All questions were answered. The patient knows to call the clinic with any problems, questions or concerns. The total time spent in the appointment was 25 minutes encounter with patients including review of chart and various tests results, discussions about plan of care and coordination of care plan   Heath Lark, MD 05/29/2020 11:36 AM  INTERVAL HISTORY: Please see below for problem oriented charting. She returns for treatment and follow-up We discussed some of the list of things that she wrote in the electronic record, addressing her concerns about the use of Compazine before treatment, risk and benefits of pancytopenia while on treatment, and the benefit of second opinion and possible referral elsewhere for tertiary center opinion She tolerated gemcitabine well Denies excessive fatigue No recent infection, fever or chills The patient denies any recent signs or symptoms of bleeding such as spontaneous epistaxis, hematuria or hematochezia.  SUMMARY OF ONCOLOGIC HISTORY: Oncology History Overview Note  Neg genetics. Additional tests revealed ER: 80%, PR 3% MSI stable Progressed on  Avastin, Doxil and Taxol Allergic to carboplatin   Right ovarian epithelial cancer ([place])  07/01/2015 Initial Diagnosis   She presented with postmenopausal bleeding   07/02/2015 Imaging   US pelvis showed bilateral ovarian cyst   07/13/2015 Imaging   5.2 cm left adnexal cystic lesion, with indeterminate but probably benign characteristics. In a postmenopausal female, consider continued annual imaging followup with CT or MRI versus surgical evaluation.  Colonic diverticulosis. No radiographic evidence of diverticulitis.  Cholelithiasis.  No radiographic evidence of cholecystitis.  Small hiatal hernia.   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  The tumor involves serosal surface of the right ovary and adjacent right fallopian tube.  Cervix and endometrium were within normal limits. ER positive Peritoneal washing was positive.   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   08/13/2015 Tumor Marker   Patient's tumor was tested for the following markers: CA-125 Results of the tumor marker test revealed 96   08/25/2015 - 12/08/2015 Chemotherapy   The patient had 6 cycles of carboplatin and taxol   09/07/2015 Tumor Marker   Patient's tumor was tested for the following markers: CA-125 Results of the tumor marker test revealed 51   10/05/2015 Tumor Marker   Patient's tumor was tested for the following markers: CA-125 Results of the tumor marker test revealed 29   11/09/2015 Tumor Marker   Patient's tumor was tested for the following markers: CA-125 Results of the tumor marker test  revealed 44   12/08/2015 Tumor Marker   Patient's tumor was tested for the following markers:  CA-125 Results of the tumor marker test revealed 30   12/15/2015 Genetic Testing   Genetics testing normal by [facility] Breast Ovarian panel 12-15-15   01/11/2016 Imaging   1. The only finding of note are several adjacent peripheral abnormal hypodensities along the anterior superior spleen margin, differential diagnostic considerations including small peripheral interval splenic infarcts, splenic injury with subcapsular a fluid collections, or less likely metastatic disease to the splenic margin. This likely merits observation. 2. Other imaging findings of potential clinical significance: Small type 1 hiatal hernia. Aortoiliac atherosclerotic vascular disease. Lower lumbar spondylosis and degenerative disc disease. Gallstones with potential mild gallbladder wall thickening.   03/07/2016 Tumor Marker   Patient's tumor was tested for the following markers: CA-125 Results of the tumor marker test revealed 24.2   04/06/2016 Imaging   1. No evidence of a ovarian cancer metastasis. 2. No ascites. 3. Post hysterectomy and oophorectomy. 4. Cholelithiasis with several large gallstones   04/06/2016 Tumor Marker   Patient's tumor was tested for the following markers: CA-125 Results of the tumor marker test revealed 22.8   05/30/2016 Tumor Marker   Patient's tumor was tested for the following markers: CA-125 Results of the tumor marker test revealed 21   09/07/2016 Tumor Marker   Patient's tumor was tested for the following markers: CA-125 Results of the tumor marker test revealed 22.4   11/28/2016 Tumor Marker   Patient's tumor was tested for the following markers: CA-125 Results of the tumor marker test revealed 18.8   02/23/2017 Tumor Marker   Patient's tumor was tested for the following markers: CA-125 Results of the tumor marker test revealed 19.1   06/02/2017 Tumor Marker   Patient's tumor was tested for the following markers: CA-125 Results of the tumor marker test revealed 18.3   08/24/2017  Mammogram   Pt reports mammogram complete   08/28/2017 Tumor Marker   Patient's tumor was tested for the following markers: CA-125 Results of the tumor marker test revealed 21.1   12/01/2017 Tumor Marker   Patient's tumor was tested for the following markers: CA-125 Results of the tumor marker test revealed 31.2   12/13/2017 Imaging   New 2.7 cm peritoneal soft tissue mass in anterior left lower quadrant, consistent with metastatic disease.  No other sites of metastatic disease identified.  Incidental findings including: Cholelithiasis. Colonic diverticulosis. Tiny hiatal hernia. Aortic atherosclerosis.   01/09/2018 - 05/07/2018 Chemotherapy   The patient had carboplatin and taxol; After 2nd cycle, she developed carboplatin allergy. She received carboplatin desensitization protocol at [facility] for final 4 cycles, completed by 6/17. Avastin was added from 04/16/18 onwards   01/30/2018 Adverse Reaction   Potential carboplatin allergy is suspected. She received half the dose of prescribed carboplatin on cycle 2   05/31/2018 Tumor Marker   Patient's tumor was tested for the following markers: CA-125 Results of the tumor marker test revealed 26   05/31/2018 Imaging   1. Peritoneal soft tissue nodule identified on the previous study has decreased in the interval the. No progressive findings in the abdomen or pelvis on today's study to suggest disease progression. 2. Tiny pulmonary nodules, likely benign. Attention on follow-up recommended. 3. Cholelithiasis. 4.  Aortic Atherosclerois (ICD10-170.0) 5. Tiny hiatal hernia.     06/04/2018 - 07/30/2019 Chemotherapy   The patient is placed on Avastin for maintenance   06/25/2018 Tumor Marker   Patient's tumor was tested for   the following markers: CA-125 Results of the tumor marker test revealed 22.5   08/06/2018 Tumor Marker   Patient's tumor was tested for the following markers: CA-125 Results of the tumor marker test revealed 20.7   09/14/2018  Imaging   Status post hysterectomy and bilateral salpingo-oophorectomy.  Stable soft tissue nodule beneath the left lower anterior abdominal wall, likely reflecting stable peritoneal disease.  Scattered small subpleural nodules in the lungs bilaterally, measuring up to 3 mm, technically indeterminate although likely benign. Please note that Fleischner Society guidelines do not apply. Attention on follow-up is suggested.  No evidence of new/progressive metastatic disease.   09/14/2018 Tumor Marker   Patient's tumor was tested for the following markers: CA-125 Results of the tumor marker test revealed 21.3   10/30/2018 Tumor Marker   Patient's tumor was tested for the following markers: CA-125 Results of the tumor marker test revealed 20.8   12/12/2018 Tumor Marker   Patient's tumor was tested for the following markers: CA-125 Results of the tumor marker test revealed 19.7   01/01/2019 Tumor Marker   Patient's tumor was tested for the following markers: CA-125 Results of the tumor marker test revealed 21.6   01/22/2019 Tumor Marker   Patient's tumor was tested for the following markers: CA-125 Results of the tumor marker test revealed 21.3   02/12/2019 Tumor Marker   Patient's tumor was tested for the following markers: CA-125 Results of the tumor marker test revealed 21.6   04/16/2019 Tumor Marker   Patient's tumor was tested for the following markers: CA-125 Results of the tumor marker test revealed 21.7   05/07/2019 Tumor Marker   Patient's tumor was tested for the following markers: CA-125 Results of the tumor marker test revealed 27.3   05/28/2019 Tumor Marker   Patient's tumor was tested for the following markers: CA-125 Results of the tumor marker test revealed 24.5   07/09/2019 - 08/19/2019 Chemotherapy   The patient had bevacizumab for chemotherapy treatment.     07/09/2019 Tumor Marker   Patient's tumor was tested for the following markers: CA-125 Results of the tumor  marker test revealed 23.3.   08/06/2019 Imaging   Ct abdomen and pelvis 1. New hypodense lesions of the left lobe of the liver measuring 2.5 x 2.5 cm (series 2, image 16) and right lobe of the liver measuring 1.1 x 1.1 cm (series 2, image 23), concerning for metastatic disease.   2. Interval enlargement of an irregular nodule of the omentum measuring 1.0 x 1.0 cm, previously 1.0 x 0.5 cm (series 2, image 51), concerning for peritoneal disease.   3.  Status post hysterectomy.   4. Other chronic and incidental findings as detailed above. Aortic Atherosclerosis (ICD10-I70.0).   08/16/2019 - 01/17/2020 Chemotherapy   The patient had weekly Taxol for chemotherapy treatment.     08/23/2019 Tumor Marker   Patient's tumor was tested for the following markers: CA-125 Results of the tumor marker test revealed 33.7.   09/06/2019 Tumor Marker   Patient's tumor was tested for the following markers: CA-125 Results of the tumor marker test revealed 32.2   10/14/2019 Imaging   1. Interval decrease in hypodense masses of the left lobe and right lobe of the liver, mass in the left lobe measuring 1.7 x 1.6 cm, previously 2.5 x 2.5 cm (series 2, image 14) and in the right lobe of the liver measuring 0.8 x 0.7 cm, previously 1.1 x 1.1 cm (series 2, image 22).   2. Interval decrease in size   of an omental nodule, measuring 0.8 x 0.7 cm, previously 1.0 x 1.0 cm (series 2, image 55).   3. Findings are consistent with improved metastatic disease. No new evidence of metastatic disease in the abdomen or pelvis.   4.  Status post hysterectomy.   5.  Cholelithiasis.   6.  Aortic Atherosclerosis (ICD10-I70.0).   01/23/2020 Imaging   1. 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 2. Slight increase in size of left lower quadrant omental soft tissue nodule, consistent with metastatic disease. 3. No new sites of metastatic disease identified. 4. Stable cholelithiasis and gallbladder wall thickening. No  evidence of acute cholecystitis. 5. Colonic diverticulosis. No radiographic evidence of diverticulitis. 6. Tiny hiatal hernia.   01/28/2020 Echocardiogram    1. Left ventricular ejection fraction, by estimation, is 60 to 65%. The left ventricle has normal function. The left ventricle has no regional wall motion abnormalities. Left ventricular diastolic parameters are consistent with Grade I diastolic dysfunction (impaired relaxation). The average left ventricular global longitudinal strain is -19.7 % (normal), however this may be underestimated given suboptimal tracking.  2. Right ventricular systolic function is normal. The right ventricular size is normal.  3. The mitral valve is normal in structure. Mild mitral valve regurgitation. No evidence of mitral stenosis.  4. The aortic valve is normal in structure. Aortic valve regurgitation is not visualized. No aortic stenosis is present.  5. The inferior vena cava is normal in size with greater than 50% respiratory variability, suggesting right atrial pressure of 3 mmHg.   Conclusion(s)/Recommendation(s): Normal biventricular function without evidence of hemodynamically significant valvular heart disease   01/31/2020 - 04/23/2020 Chemotherapy   The patient had DOXOrubicin for chemotherapy treatment.     04/16/2020 Imaging   1. Mild increase in size of metastasis involving the left hepatic lobe. 2. Stable small left lower quadrant omental soft tissue nodule, suspicious for peritoneal metastasis. 3. No new sites of metastatic disease identified. 4. Cholelithiasis. No radiographic evidence of cholecystitis. 5. Colonic diverticulosis. No radiographic evidence of diverticulitis. 6. Small hiatal hernia.   Aortic Atherosclerosis (ICD10-I70.0).   05/01/2020 -  Chemotherapy   The patient had gemcitabine for chemotherapy treatment.       REVIEW OF SYSTEMS:   Constitutional: Denies fevers, chills or abnormal weight loss Eyes: Denies blurriness of  vision Ears, nose, mouth, throat, and face: Denies mucositis or sore throat Respiratory: Denies cough, dyspnea or wheezes Cardiovascular: Denies palpitation, chest discomfort or lower extremity swelling Gastrointestinal:  Denies nausea, heartburn or change in bowel habits Skin: Denies abnormal skin rashes Lymphatics: Denies new lymphadenopathy or easy bruising Neurological:Denies numbness, tingling or new weaknesses Behavioral/Psych: Mood is stable, no new changes  All other systems were reviewed with the patient and are negative.  I have reviewed the past medical history, past surgical history, social history and family history with the patient and they are unchanged from previous note.  ALLERGIES:  is allergic to carboplatin.  MEDICATIONS:  Current Outpatient Medications  Medication Sig Dispense Refill  . amLODipine (NORVASC) 10 MG tablet Take 1 tablet (10 mg total) by mouth daily. 90 tablet 3  . atenolol (TENORMIN) 25 MG tablet Take 1 tablet (25 mg total) by mouth daily. 30 tablet 9  . atorvastatin (LIPITOR) 10 MG tablet Take 1 tablet (10 mg total) by mouth daily. 30 tablet 2  . Coenzyme Q10 (CO Q 10) 100 MG CAPS Take 1 capsule by mouth daily.     . doxycycline (VIBRAMYCIN) 50 MG capsule Take   1 capsule (50 mg total) by mouth 2 (two) times daily. 14 capsule 0  . Glucosamine-Chondroit-Vit C-Mn (GLUCOSAMINE 1500 COMPLEX PO) Take 1 capsule by mouth 2 (two) times daily.    [name] lidocaine-prilocaine (EMLA) cream Apply to affected area once 30 g 3  . loratadine (CLARITIN) 10 MG tablet Take 10 mg by mouth daily as needed for allergies (hives).    . Multiple Vitamin (MULTIVITAMIN) capsule Take 1 capsule by mouth daily.     . Omega-3 Fatty Acids (OMEGA-3 FISH OIL) 300 MG CAPS Take 1 capsule by mouth daily.     . ondansetron (ZOFRAN) 8 MG tablet Take 1 tablet (8 mg total) by mouth every 8 (eight) hours as needed for refractory nausea / vomiting. 30 tablet 1  . Polyethyl Glycol-Propyl Glycol  (SYSTANE ULTRA OP) Apply 1 drop to eye 2 (two) times daily at 10 AM and 5 PM.    . Probiotic Product (PROBIOTIC ADVANCED PO) Take 1 capsule by mouth daily.    . prochlorperazine (COMPAZINE) 10 MG tablet Take 1 tablet (10 mg total) by mouth every 6 (six) hours as needed (Nausea or vomiting). 60 tablet 1  . vitamin E 400 UNIT capsule Take 400 Units by mouth every evening.      No current facility-administered medications for this visit.   Facility-Administered Medications Ordered in Other Visits  Medication Dose Route Frequency Provider Last Rate Last Admin  . gemcitabine (GEMZAR) 988 mg in sodium chloride 0.9 % 250 mL chemo infusion  600 mg/m2 (Treatment Plan Recorded) Intravenous Once Alvy Bimler, Eldredge Veldhuizen, MD      . heparin lock flush 100 unit/mL  500 Units Intracatheter Once PRN Alvy Bimler, Ranvir Renovato, MD      . sodium chloride flush (NS) 0.9 % injection 10 mL  10 mL Intracatheter PRN Alvy Bimler, Damara Klunder, MD        PHYSICAL EXAMINATION: ECOG PERFORMANCE STATUS: 1 - Symptomatic but completely ambulatory  Vitals:   05/29/20 1044  BP: (!) 148/52  Pulse: 68  Resp: 18  Temp: 98.3 F (36.8 C)  SpO2: 100%   Filed Weights   05/29/20 1044  Weight: 142 lb (64.4 kg)    GENERAL:alert, no distress and comfortable NEURO: alert & oriented x 3 with fluent speech, no focal motor/sensory deficits  LABORATORY DATA:  I have reviewed the data as listed    Component Value Date/Time   NA 142 05/29/2020 1023   NA 142 11/28/2016 0916   K 3.8 05/29/2020 1023   K 3.9 11/28/2016 0916   CL 108 05/29/2020 1023   CO2 25 05/29/2020 1023   CO2 28 11/28/2016 0916   GLUCOSE 92 05/29/2020 1023   GLUCOSE 78 11/28/2016 0916   BUN 20 05/29/2020 1023   BUN 16.8 11/28/2016 0916   CREATININE 0.78 05/29/2020 1023   CREATININE 0.8 11/28/2016 0916   CALCIUM 9.4 05/29/2020 1023   CALCIUM 10.5 (H) 11/28/2016 0916   PROT 6.4 (L) 05/29/2020 1023   PROT 7.3 11/28/2016 0916   ALBUMIN 3.6 05/29/2020 1023   ALBUMIN 4.3 11/28/2016 0916    AST 29 05/29/2020 1023   AST 19 11/28/2016 0916   ALT 18 05/29/2020 1023   ALT 19 11/28/2016 0916   ALKPHOS 55 05/29/2020 1023   ALKPHOS 50 11/28/2016 0916   BILITOT 0.5 05/29/2020 1023   BILITOT 0.48 11/28/2016 0916   GFRNONAA >60 05/29/2020 1023   GFRAA >60 05/29/2020 1023    No results found for: SPEP, UPEP  Lab Results  Component Value Date  WBC 3.5 (L) 05/29/2020   NEUTROABS 1.5 (L) 05/29/2020   HGB 9.6 (L) 05/29/2020   HCT 28.8 (L) 05/29/2020   MCV 93.8 05/29/2020   PLT 78 (L) 05/29/2020      Chemistry      Component Value Date/Time   NA 142 05/29/2020 1023   NA 142 11/28/2016 0916   K 3.8 05/29/2020 1023   K 3.9 11/28/2016 0916   CL 108 05/29/2020 1023   CO2 25 05/29/2020 1023   CO2 28 11/28/2016 0916   BUN 20 05/29/2020 1023   BUN 16.8 11/28/2016 0916   CREATININE 0.78 05/29/2020 1023   CREATININE 0.8 11/28/2016 0916      Component Value Date/Time   CALCIUM 9.4 05/29/2020 1023   CALCIUM 10.5 (H) 11/28/2016 0916   ALKPHOS 55 05/29/2020 1023   ALKPHOS 50 11/28/2016 0916   AST 29 05/29/2020 1023   AST 19 11/28/2016 0916   ALT 18 05/29/2020 1023   ALT 19 11/28/2016 0916   BILITOT 0.5 05/29/2020 1023   BILITOT 0.48 11/28/2016 0916

## 2020-05-29 NOTE — Telephone Encounter (Signed)
Scheduled appts per 7/9 sch msg. Pt declined print out of AVS and stated she would refer to mychart.

## 2020-05-29 NOTE — Assessment & Plan Note (Signed)
She has intermittent pancytopenia despite dose adjustment We will proceed with treatment today despite pancytopenia We discussed risk of neutropenic precaution and risk of bleeding Moving forward, I will space out her treatment to every other week

## 2020-05-29 NOTE — Patient Instructions (Signed)
[facility] Discharge Instructions for Patients Receiving Chemotherapy  Today you received the following chemotherapy agents Gemcitabine (GEMZAR).  To help prevent nausea and vomiting after your treatment, we encourage you to take your nausea medication as prescribed.   If you develop nausea and vomiting that is not controlled by your nausea medication, call the clinic.   BELOW ARE SYMPTOMS THAT SHOULD BE REPORTED IMMEDIATELY:  *FEVER GREATER THAN 100.5 F  *CHILLS WITH OR WITHOUT FEVER  NAUSEA AND VOMITING THAT IS NOT CONTROLLED WITH YOUR NAUSEA MEDICATION  *UNUSUAL SHORTNESS OF BREATH  *UNUSUAL BRUISING OR BLEEDING  TENDERNESS IN MOUTH AND THROAT WITH OR WITHOUT PRESENCE OF ULCERS  *URINARY PROBLEMS  *BOWEL PROBLEMS  UNUSUAL RASH Items with * indicate a potential emergency and should be followed up as soon as possible.  Feel free to call the clinic should you have any questions or concerns. The clinic phone number is [phone].  Please show the CHEMO ALERT CARD at check-in to the Emergency Department and triage nurse.   

## 2020-06-05 ENCOUNTER — Encounter: Payer: Self-pay | Admitting: Hematology and Oncology

## 2020-06-12 ENCOUNTER — Inpatient Hospital Stay: Payer: Medicare PPO

## 2020-06-12 ENCOUNTER — Other Ambulatory Visit: Payer: Self-pay

## 2020-06-12 VITALS — BP 162/61 | HR 58 | Temp 98.1°F | Resp 18

## 2020-06-12 DIAGNOSIS — Z7189 Other specified counseling: Secondary | ICD-10-CM

## 2020-06-12 DIAGNOSIS — C561 Malignant neoplasm of right ovary: Secondary | ICD-10-CM

## 2020-06-12 DIAGNOSIS — Z5111 Encounter for antineoplastic chemotherapy: Secondary | ICD-10-CM | POA: Diagnosis not present

## 2020-06-12 LAB — CBC WITH DIFFERENTIAL (CANCER CENTER ONLY)
Abs Immature Granulocytes: 0 10*3/uL (ref 0.00–0.07)
Basophils Absolute: 0 10*3/uL (ref 0.0–0.1)
Basophils Relative: 1 %
Eosinophils Absolute: 0.3 10*3/uL (ref 0.0–0.5)
Eosinophils Relative: 10 %
HCT: 29.5 % — ABNORMAL LOW (ref 36.0–46.0)
Hemoglobin: 9.8 g/dL — ABNORMAL LOW (ref 12.0–15.0)
Immature Granulocytes: 0 %
Lymphocytes Relative: 24 %
Lymphs Abs: 0.8 10*3/uL (ref 0.7–4.0)
MCH: 32.6 pg (ref 26.0–34.0)
MCHC: 33.2 g/dL (ref 30.0–36.0)
MCV: 98 fL (ref 80.0–100.0)
Monocytes Absolute: 0.5 10*3/uL (ref 0.1–1.0)
Monocytes Relative: 17 %
Neutro Abs: 1.6 10*3/uL — ABNORMAL LOW (ref 1.7–7.7)
Neutrophils Relative %: 48 %
Platelet Count: 146 10*3/uL — ABNORMAL LOW (ref 150–400)
RBC: 3.01 MIL/uL — ABNORMAL LOW (ref 3.87–5.11)
RDW: 16.7 % — ABNORMAL HIGH (ref 11.5–15.5)
WBC Count: 3.2 10*3/uL — ABNORMAL LOW (ref 4.0–10.5)
nRBC: 0 % (ref 0.0–0.2)

## 2020-06-12 LAB — CMP (CANCER CENTER ONLY)
ALT: 15 U/L (ref 0–44)
AST: 27 U/L (ref 15–41)
Albumin: 3.6 g/dL (ref 3.5–5.0)
Alkaline Phosphatase: 50 U/L (ref 38–126)
Anion gap: 9 (ref 5–15)
BUN: 21 mg/dL (ref 8–23)
CO2: 25 mmol/L (ref 22–32)
Calcium: 9.6 mg/dL (ref 8.9–10.3)
Chloride: 110 mmol/L (ref 98–111)
Creatinine: 0.79 mg/dL (ref 0.44–1.00)
GFR, Est AFR Am: >60 mL/min (ref >60)
GFR, Estimated: >60 mL/min (ref >60)
Glucose, Bld: 96 mg/dL (ref 70–99)
Potassium: 3.9 mmol/L (ref 3.5–5.1)
Sodium: 144 mmol/L (ref 135–145)
Total Bilirubin: 0.4 mg/dL (ref 0.3–1.2)
Total Protein: 6.4 g/dL — ABNORMAL LOW (ref 6.5–8.1)

## 2020-06-12 MED ORDER — PROCHLORPERAZINE MALEATE 10 MG PO TABS
ORAL_TABLET | ORAL | Status: AC
  Filled 2020-06-12: qty 1

## 2020-06-12 MED ORDER — PROCHLORPERAZINE MALEATE 10 MG PO TABS
10.0000 mg | ORAL_TABLET | Freq: Once | ORAL | Status: AC
  Administered 2020-06-12: 10 mg via ORAL

## 2020-06-12 MED ORDER — SODIUM CHLORIDE 0.9% FLUSH
10.0000 mL | INTRAVENOUS | Status: DC | PRN
  Administered 2020-06-12: 10 mL
  Filled 2020-06-12: qty 10

## 2020-06-12 MED ORDER — SODIUM CHLORIDE 0.9 % IV SOLN
600.0000 mg/m2 | Freq: Once | INTRAVENOUS | Status: AC
  Administered 2020-06-12: 988 mg via INTRAVENOUS
  Filled 2020-06-12: qty 25.98

## 2020-06-12 MED ORDER — HEPARIN SOD (PORK) LOCK FLUSH 100 UNIT/ML IV SOLN
500.0000 [IU] | Freq: Once | INTRAVENOUS | Status: AC | PRN
  Administered 2020-06-12: 500 [IU]
  Filled 2020-06-12: qty 5

## 2020-06-12 MED ORDER — SODIUM CHLORIDE 0.9 % IV SOLN
Freq: Once | INTRAVENOUS | Status: AC
  Filled 2020-06-12: qty 250

## 2020-06-12 NOTE — Patient Instructions (Signed)
[facility] °Discharge Instructions for Patients Receiving Chemotherapy ° °Today you received the following chemotherapy agents Gemzar ° °To help prevent nausea and vomiting after your treatment, we encourage you to take your nausea medication as directed. °  °If you develop nausea and vomiting that is not controlled by your nausea medication, call the clinic.  ° °BELOW ARE SYMPTOMS THAT SHOULD BE REPORTED IMMEDIATELY: °· *FEVER GREATER THAN 100.5 F °· *CHILLS WITH OR WITHOUT FEVER °· NAUSEA AND VOMITING THAT IS NOT CONTROLLED WITH YOUR NAUSEA MEDICATION °· *UNUSUAL SHORTNESS OF BREATH °· *UNUSUAL BRUISING OR BLEEDING °· TENDERNESS IN MOUTH AND THROAT WITH OR WITHOUT PRESENCE OF ULCERS °· *URINARY PROBLEMS °· *BOWEL PROBLEMS °· UNUSUAL RASH °Items with * indicate a potential emergency and should be followed up as soon as possible. ° °Feel free to call the clinic should you have any questions or concerns. The clinic phone number is [phone]. ° °Please show the CHEMO ALERT CARD at check-in to the Emergency Department and triage nurse. ° ° °

## 2020-06-16 ENCOUNTER — Encounter: Payer: Self-pay | Admitting: Hematology and Oncology

## 2020-06-24 ENCOUNTER — Encounter: Payer: Self-pay | Admitting: Hematology and Oncology

## 2020-06-26 ENCOUNTER — Inpatient Hospital Stay: Payer: Medicare PPO

## 2020-06-26 ENCOUNTER — Telehealth: Payer: Self-pay | Admitting: Hematology and Oncology

## 2020-06-26 ENCOUNTER — Encounter: Payer: Self-pay | Admitting: Hematology and Oncology

## 2020-06-26 ENCOUNTER — Inpatient Hospital Stay: Payer: Medicare PPO | Attending: Gynecology

## 2020-06-26 ENCOUNTER — Other Ambulatory Visit: Payer: Self-pay

## 2020-06-26 ENCOUNTER — Inpatient Hospital Stay: Payer: Medicare PPO | Admitting: Hematology and Oncology

## 2020-06-26 VITALS — BP 144/60 | HR 54 | Temp 97.7°F | Resp 18 | Ht 62.2 in | Wt 141.0 lb

## 2020-06-26 DIAGNOSIS — Z7189 Other specified counseling: Secondary | ICD-10-CM

## 2020-06-26 DIAGNOSIS — C561 Malignant neoplasm of right ovary: Secondary | ICD-10-CM

## 2020-06-26 DIAGNOSIS — C787 Secondary malignant neoplasm of liver and intrahepatic bile duct: Secondary | ICD-10-CM

## 2020-06-26 DIAGNOSIS — D61818 Other pancytopenia: Secondary | ICD-10-CM

## 2020-06-26 DIAGNOSIS — Z5111 Encounter for antineoplastic chemotherapy: Secondary | ICD-10-CM | POA: Insufficient documentation

## 2020-06-26 LAB — CMP (CANCER CENTER ONLY)
ALT: 16 U/L (ref 0–44)
AST: 29 U/L (ref 15–41)
Albumin: 3.7 g/dL (ref 3.5–5.0)
Alkaline Phosphatase: 56 U/L (ref 38–126)
Anion gap: 7 (ref 5–15)
BUN: 25 mg/dL — ABNORMAL HIGH (ref 8–23)
CO2: 25 mmol/L (ref 22–32)
Calcium: 9.5 mg/dL (ref 8.9–10.3)
Chloride: 109 mmol/L (ref 98–111)
Creatinine: 0.84 mg/dL (ref 0.44–1.00)
GFR, Est AFR Am: >60 mL/min (ref >60)
GFR, Estimated: >60 mL/min (ref >60)
Glucose, Bld: 96 mg/dL (ref 70–99)
Potassium: 3.9 mmol/L (ref 3.5–5.1)
Sodium: 141 mmol/L (ref 135–145)
Total Bilirubin: 0.5 mg/dL (ref 0.3–1.2)
Total Protein: 6.5 g/dL (ref 6.5–8.1)

## 2020-06-26 LAB — CBC WITH DIFFERENTIAL (CANCER CENTER ONLY)
Abs Immature Granulocytes: 0.01 10*3/uL (ref 0.00–0.07)
Basophils Absolute: 0 10*3/uL (ref 0.0–0.1)
Basophils Relative: 2 %
Eosinophils Absolute: 0.2 10*3/uL (ref 0.0–0.5)
Eosinophils Relative: 6 %
HCT: 30 % — ABNORMAL LOW (ref 36.0–46.0)
Hemoglobin: 10 g/dL — ABNORMAL LOW (ref 12.0–15.0)
Immature Granulocytes: 0 %
Lymphocytes Relative: 24 %
Lymphs Abs: 0.6 10*3/uL — ABNORMAL LOW (ref 0.7–4.0)
MCH: 32.4 pg (ref 26.0–34.0)
MCHC: 33.3 g/dL (ref 30.0–36.0)
MCV: 97.1 fL (ref 80.0–100.0)
Monocytes Absolute: 0.5 10*3/uL (ref 0.1–1.0)
Monocytes Relative: 20 %
Neutro Abs: 1.2 10*3/uL — ABNORMAL LOW (ref 1.7–7.7)
Neutrophils Relative %: 48 %
Platelet Count: 84 10*3/uL — ABNORMAL LOW (ref 150–400)
RBC: 3.09 MIL/uL — ABNORMAL LOW (ref 3.87–5.11)
RDW: 15.9 % — ABNORMAL HIGH (ref 11.5–15.5)
WBC Count: 2.6 10*3/uL — ABNORMAL LOW (ref 4.0–10.5)
nRBC: 0 % (ref 0.0–0.2)

## 2020-06-26 MED ORDER — PROCHLORPERAZINE MALEATE 10 MG PO TABS
10.0000 mg | ORAL_TABLET | Freq: Once | ORAL | Status: AC
  Administered 2020-06-26: 10 mg via ORAL

## 2020-06-26 MED ORDER — HEPARIN SOD (PORK) LOCK FLUSH 100 UNIT/ML IV SOLN
500.0000 [IU] | Freq: Once | INTRAVENOUS | Status: AC | PRN
  Administered 2020-06-26: 500 [IU]
  Filled 2020-06-26: qty 5

## 2020-06-26 MED ORDER — SODIUM CHLORIDE 0.9 % IV SOLN
Freq: Once | INTRAVENOUS | Status: AC
  Filled 2020-06-26: qty 250

## 2020-06-26 MED ORDER — SODIUM CHLORIDE 0.9% FLUSH
10.0000 mL | INTRAVENOUS | Status: DC | PRN
  Administered 2020-06-26: 10 mL
  Filled 2020-06-26: qty 10

## 2020-06-26 MED ORDER — SODIUM CHLORIDE 0.9% FLUSH
10.0000 mL | Freq: Once | INTRAVENOUS | Status: AC
  Administered 2020-06-26: 10 mL
  Filled 2020-06-26: qty 10

## 2020-06-26 MED ORDER — PROCHLORPERAZINE MALEATE 10 MG PO TABS
ORAL_TABLET | ORAL | Status: AC
  Filled 2020-06-26: qty 1

## 2020-06-26 MED ORDER — SODIUM CHLORIDE 0.9 % IV SOLN
500.0000 mg/m2 | Freq: Once | INTRAVENOUS | Status: AC
  Administered 2020-06-26: 836 mg via INTRAVENOUS
  Filled 2020-06-26: qty 21.99

## 2020-06-26 NOTE — Assessment & Plan Note (Signed)
Despite pancytopenia, she is not symptomatic We will proceed with treatment with further dose adjustment I plan to order CT imaging at the end of the month

## 2020-06-26 NOTE — Patient Instructions (Signed)
[facility] °Discharge Instructions for Patients Receiving Chemotherapy ° °Today you received the following chemotherapy agents Gemzar ° °To help prevent nausea and vomiting after your treatment, we encourage you to take your nausea medication as directed. °  °If you develop nausea and vomiting that is not controlled by your nausea medication, call the clinic.  ° °BELOW ARE SYMPTOMS THAT SHOULD BE REPORTED IMMEDIATELY: °· *FEVER GREATER THAN 100.5 F °· *CHILLS WITH OR WITHOUT FEVER °· NAUSEA AND VOMITING THAT IS NOT CONTROLLED WITH YOUR NAUSEA MEDICATION °· *UNUSUAL SHORTNESS OF BREATH °· *UNUSUAL BRUISING OR BLEEDING °· TENDERNESS IN MOUTH AND THROAT WITH OR WITHOUT PRESENCE OF ULCERS °· *URINARY PROBLEMS °· *BOWEL PROBLEMS °· UNUSUAL RASH °Items with * indicate a potential emergency and should be followed up as soon as possible. ° °Feel free to call the clinic should you have any questions or concerns. The clinic phone number is [phone]. ° °Please show the CHEMO ALERT CARD at check-in to the Emergency Department and triage nurse. ° ° °

## 2020-06-26 NOTE — Assessment & Plan Note (Signed)
She continues to have chronic intermittent pancytopenia despite dose adjustment and modification to her treatment schedule We will proceed with treatment after explaining to the patient the risk and benefits of proceeding despite pancytopenia I plan to modify the dose of gemcitabine further She does not need transfusion support

## 2020-06-26 NOTE — Progress Notes (Signed)
Per Dr. Alvy Bimler, ok to treat with labs from today.  Dr. Alvy Bimler to reduce dose of gemcitabine.

## 2020-06-26 NOTE — Patient Instructions (Signed)

## 2020-06-26 NOTE — Assessment & Plan Note (Signed)
We have extensive goals of care discussions She would like to understand the plan of care, the role of palliative care, hospice, etc. For now, she is in agreement to proceed with treatment as scheduled We will refer her to palliative care service if indicated after neck CT imaging results are available

## 2020-06-26 NOTE — Assessment & Plan Note (Signed)
Observe for now Her liver enzymes remain preserved

## 2020-06-26 NOTE — Telephone Encounter (Signed)
Scheduled appts per 8/6 sch msg. Pt confirmed appt date and time.

## 2020-06-26 NOTE — Progress Notes (Signed)
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[facility]) Despite pancytopenia, she is not symptomatic We will proceed with treatment with further dose adjustment I plan to order CT imaging at the end of the month   Pancytopenia, acquired [facility]) She continues to have chronic intermittent pancytopenia despite dose adjustment and modification to her treatment schedule We will proceed with treatment after explaining to the patient the risk and benefits of proceeding despite pancytopenia I plan to modify the dose of gemcitabine further She does not need transfusion support  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 For now, she is in agreement to proceed with treatment as scheduled We will refer her to palliative care service if indicated after neck CT imaging results are available   Orders Placed This Encounter  Procedures  . CT ABDOMEN PELVIS W CONTRAST    Standing Status:   Future    Standing Expiration Date:   06/26/2021    Order Specific Question:   If indicated for the ordered procedure, I authorize the administration of contrast media per Radiology protocol    Answer:   Yes    Order Specific Question:   Preferred imaging location?    Answer:   [facility] [place]    Order Specific Question:   Radiology Contrast Protocol - do NOT remove file path    Answer:   [email or web address]    All questions were answered. The patient knows to call the clinic with any problems, questions or concerns. The total time spent in the appointment was 30 minutes encounter with patients including review of chart and various tests results,  discussions about plan of care and coordination of care plan   Heath Lark, MD 06/26/2020 1:17 PM  INTERVAL HISTORY: Please see below for problem oriented charting. She returns for cycle 3 of treatment She has some mild occasional fatigue No recent infection, fever or chills The patient denies any recent signs or symptoms of bleeding such as spontaneous epistaxis, hematuria or hematochezia. Denies nausea, abdominal pain or changes in bowel habits She would like to inquire further about the role of palliative care and hospice in the setting of terminal illness  SUMMARY OF ONCOLOGIC HISTORY: Oncology History Overview Note  Neg genetics. Additional tests revealed ER: 80%, PR 3% MSI stable Progressed on Avastin, Doxil and Taxol Allergic to carboplatin   Right ovarian epithelial cancer ([place])  07/01/2015 Initial Diagnosis   She presented with postmenopausal bleeding   07/02/2015 Imaging   US pelvis showed bilateral ovarian cyst   07/13/2015 Imaging   5.2 cm left adnexal cystic lesion, with indeterminate but probably benign characteristics. In a postmenopausal female, consider continued annual imaging followup with CT or MRI versus surgical evaluation.  Colonic diverticulosis. No radiographic evidence of diverticulitis.  Cholelithiasis.  No radiographic evidence of cholecystitis.  Small hiatal hernia.   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  The tumor involves serosal surface of the right ovary and adjacent right fallopian tube.  Cervix and endometrium were within normal limits. ER positive Peritoneal washing was positive.   07/30/2015 Surgery   SHe underwent laparoscopic-assisted total vaginal hysterectomy,  bilateral salpingo-oophorectomy   07/30/2015 Pathology Results   PERITONEAL WASHING PELVIC (SPECIMEN 1 OF 1, COLLECTED ON 07/30/2015): MALIGNANT CELLS CONSISTENT WITH HIGH GRADE CARCINOMA    08/13/2015 Tumor Marker   Patient's tumor was tested for the following markers: CA-125 Results of the tumor marker test revealed 96   08/25/2015 - 12/08/2015 Chemotherapy   The patient had 6 cycles of carboplatin and taxol   09/07/2015 Tumor Marker   Patient's tumor was tested for the following markers: CA-125 Results of the tumor marker test revealed 51   10/05/2015 Tumor Marker   Patient's tumor was tested for the following markers: CA-125 Results of the tumor marker test revealed 29   11/09/2015 Tumor Marker   Patient's tumor was tested for the following markers: CA-125 Results of the tumor marker test revealed 44   12/08/2015 Tumor Marker   Patient's tumor was tested for the following markers: CA-125 Results of the tumor marker test revealed 30   12/15/2015 Genetic Testing   Genetics testing normal by [facility] Breast Ovarian panel 12-15-15   01/11/2016 Imaging   1. The only finding of note are several adjacent peripheral abnormal hypodensities along the anterior superior spleen margin, differential diagnostic considerations including small peripheral interval splenic infarcts, splenic injury with subcapsular a fluid collections, or less likely metastatic disease to the splenic margin. This likely merits observation. 2. Other imaging findings of potential clinical significance: Small type 1 hiatal hernia. Aortoiliac atherosclerotic vascular disease. Lower lumbar spondylosis and degenerative disc disease. Gallstones with potential mild gallbladder wall thickening.   03/07/2016 Tumor Marker   Patient's tumor was tested for the following markers: CA-125 Results of the tumor marker test revealed 24.2   04/06/2016 Imaging   1. No evidence of a ovarian cancer metastasis. 2. No ascites. 3. Post hysterectomy and oophorectomy. 4. Cholelithiasis with several large gallstones   04/06/2016 Tumor Marker   Patient's tumor was tested for the following markers: CA-125 Results of the tumor marker  test revealed 22.8   05/30/2016 Tumor Marker   Patient's tumor was tested for the following markers: CA-125 Results of the tumor marker test revealed 21   09/07/2016 Tumor Marker   Patient's tumor was tested for the following markers: CA-125 Results of the tumor marker test revealed 22.4   11/28/2016 Tumor Marker   Patient's tumor was tested for the following markers: CA-125 Results of the tumor marker test revealed 18.8   02/23/2017 Tumor Marker   Patient's tumor was tested for the following markers: CA-125 Results of the tumor marker test revealed 19.1   06/02/2017 Tumor Marker   Patient's tumor was tested for the following markers: CA-125 Results of the tumor marker test revealed 18.3   08/24/2017 Mammogram   Pt reports mammogram complete   08/28/2017 Tumor Marker   Patient's tumor was tested for the following markers: CA-125 Results of the tumor marker test revealed 21.1   12/01/2017 Tumor Marker   Patient's tumor was tested for the following markers: CA-125 Results of the tumor marker test revealed 31.2   12/13/2017 Imaging   New 2.7 cm peritoneal soft tissue mass in anterior left lower quadrant, consistent with metastatic disease.  No other sites of metastatic disease identified.  Incidental findings including: Cholelithiasis. Colonic diverticulosis. Tiny hiatal hernia. Aortic atherosclerosis.   01/09/2018 - 05/07/2018 Chemotherapy   The patient had carboplatin and taxol; After 2nd cycle, she developed carboplatin allergy. She received carboplatin desensitization protocol at [facility] for final 4 cycles, completed by 6/17. Avastin was added from  04/16/18 onwards   01/30/2018 Adverse Reaction   Potential carboplatin allergy is suspected. She received half the dose of prescribed carboplatin on cycle 2   05/31/2018 Tumor Marker   Patient's tumor was tested for the following markers: CA-125 Results of the tumor marker test revealed 26   05/31/2018 Imaging   1. Peritoneal soft tissue  nodule identified on the previous study has decreased in the interval the. No progressive findings in the abdomen or pelvis on today's study to suggest disease progression. 2. Tiny pulmonary nodules, likely benign. Attention on follow-up recommended. 3. Cholelithiasis. 4.  Aortic Atherosclerois (ICD10-170.0) 5. Tiny hiatal hernia.     06/04/2018 - 07/30/2019 Chemotherapy   The patient is placed on Avastin for maintenance   06/25/2018 Tumor Marker   Patient's tumor was tested for the following markers: CA-125 Results of the tumor marker test revealed 22.5   08/06/2018 Tumor Marker   Patient's tumor was tested for the following markers: CA-125 Results of the tumor marker test revealed 20.7   09/14/2018 Imaging   Status post hysterectomy and bilateral salpingo-oophorectomy.  Stable soft tissue nodule beneath the left lower anterior abdominal wall, likely reflecting stable peritoneal disease.  Scattered small subpleural nodules in the lungs bilaterally, measuring up to 3 mm, technically indeterminate although likely benign. Please note that Fleischner Society guidelines do not apply. Attention on follow-up is suggested.  No evidence of new/progressive metastatic disease.   09/14/2018 Tumor Marker   Patient's tumor was tested for the following markers: CA-125 Results of the tumor marker test revealed 21.3   10/30/2018 Tumor Marker   Patient's tumor was tested for the following markers: CA-125 Results of the tumor marker test revealed 20.8   12/12/2018 Tumor Marker   Patient's tumor was tested for the following markers: CA-125 Results of the tumor marker test revealed 19.7   01/01/2019 Tumor Marker   Patient's tumor was tested for the following markers: CA-125 Results of the tumor marker test revealed 21.6   01/22/2019 Tumor Marker   Patient's tumor was tested for the following markers: CA-125 Results of the tumor marker test revealed 21.3   02/12/2019 Tumor Marker   Patient's tumor  was tested for the following markers: CA-125 Results of the tumor marker test revealed 21.6   04/16/2019 Tumor Marker   Patient's tumor was tested for the following markers: CA-125 Results of the tumor marker test revealed 21.7   05/07/2019 Tumor Marker   Patient's tumor was tested for the following markers: CA-125 Results of the tumor marker test revealed 27.3   05/28/2019 Tumor Marker   Patient's tumor was tested for the following markers: CA-125 Results of the tumor marker test revealed 24.5   07/09/2019 - 08/19/2019 Chemotherapy   The patient had bevacizumab for chemotherapy treatment.     07/09/2019 Tumor Marker   Patient's tumor was tested for the following markers: CA-125 Results of the tumor marker test revealed 23.3.   08/06/2019 Imaging   Ct abdomen and pelvis 1. New hypodense lesions of the left lobe of the liver measuring 2.5 x 2.5 cm (series 2, image 16) and right lobe of the liver measuring 1.1 x 1.1 cm (series 2, image 23), concerning for metastatic disease.   2. Interval enlargement of an irregular nodule of the omentum measuring 1.0 x 1.0 cm, previously 1.0 x 0.5 cm (series 2, image 51), concerning for peritoneal disease.   3.  Status post hysterectomy.   4. Other chronic and incidental findings as detailed above. Aortic Atherosclerosis (  ICD10-I70.0).   08/16/2019 - 01/17/2020 Chemotherapy   The patient had weekly Taxol for chemotherapy treatment.     08/23/2019 Tumor Marker   Patient's tumor was tested for the following markers: CA-125 Results of the tumor marker test revealed 33.7.   09/06/2019 Tumor Marker   Patient's tumor was tested for the following markers: CA-125 Results of the tumor marker test revealed 32.2   10/14/2019 Imaging   1. Interval decrease in hypodense masses of the left lobe and right lobe of the liver, mass in the left lobe measuring 1.7 x 1.6 cm, previously 2.5 x 2.5 cm (series 2, image 14) and in the right lobe of the liver measuring 0.8 x  0.7 cm, previously 1.1 x 1.1 cm (series 2, image 22).   2. Interval decrease in size of an omental nodule, measuring 0.8 x 0.7 cm, previously 1.0 x 1.0 cm (series 2, image 55).   3. Findings are consistent with improved metastatic disease. No new evidence of metastatic disease in the abdomen or pelvis.   4.  Status post hysterectomy.   5.  Cholelithiasis.   6.  Aortic Atherosclerosis (ICD10-I70.0).   01/23/2020 Imaging   1. 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 2. Slight increase in size of left lower quadrant omental soft tissue nodule, consistent with metastatic disease. 3. No new sites of metastatic disease identified. 4. Stable cholelithiasis and gallbladder wall thickening. No evidence of acute cholecystitis. 5. Colonic diverticulosis. No radiographic evidence of diverticulitis. 6. Tiny hiatal hernia.   01/28/2020 Echocardiogram    1. Left ventricular ejection fraction, by estimation, is 60 to 65%. The left ventricle has normal function. The left ventricle has no regional wall motion abnormalities. Left ventricular diastolic parameters are consistent with Grade I diastolic dysfunction (impaired relaxation). The average left ventricular global longitudinal strain is -19.7 % (normal), however this may be underestimated given suboptimal tracking.  2. Right ventricular systolic function is normal. The right ventricular size is normal.  3. The mitral valve is normal in structure. Mild mitral valve regurgitation. No evidence of mitral stenosis.  4. The aortic valve is normal in structure. Aortic valve regurgitation is not visualized. No aortic stenosis is present.  5. The inferior vena cava is normal in size with greater than 50% respiratory variability, suggesting right atrial pressure of 3 mmHg.   Conclusion(s)/Recommendation(s): Normal biventricular function without evidence of hemodynamically significant valvular heart disease   01/31/2020 - 04/23/2020 Chemotherapy   The  patient had DOXOrubicin for chemotherapy treatment.     04/16/2020 Imaging   1. Mild increase in size of metastasis involving the left hepatic lobe. 2. Stable small left lower quadrant omental soft tissue nodule, suspicious for peritoneal metastasis. 3. No new sites of metastatic disease identified. 4. Cholelithiasis. No radiographic evidence of cholecystitis. 5. Colonic diverticulosis. No radiographic evidence of diverticulitis. 6. Small hiatal hernia.   Aortic Atherosclerosis (ICD10-I70.0).   05/01/2020 -  Chemotherapy   The patient had gemcitabine for chemotherapy treatment.       REVIEW OF SYSTEMS:   Constitutional: Denies fevers, chills or abnormal weight loss Eyes: Denies blurriness of vision Ears, nose, mouth, throat, and face: Denies mucositis or sore throat Respiratory: Denies cough, dyspnea or wheezes Cardiovascular: Denies palpitation, chest discomfort or lower extremity swelling Gastrointestinal:  Denies nausea, heartburn or change in bowel habits Skin: Denies abnormal skin rashes Lymphatics: Denies new lymphadenopathy or easy bruising Neurological:Denies numbness, tingling or new weaknesses Behavioral/Psych: Mood is stable, no new changes  All other  systems were reviewed with the patient and are negative.  I have reviewed the past medical history, past surgical history, social history and family history with the patient and they are unchanged from previous note.  ALLERGIES:  is allergic to carboplatin.  MEDICATIONS:  Current Outpatient Medications  Medication Sig Dispense Refill  . amLODipine (NORVASC) 10 MG tablet Take 1 tablet (10 mg total) by mouth daily. 90 tablet 3  . atenolol (TENORMIN) 25 MG tablet Take 1 tablet (25 mg total) by mouth daily. 30 tablet 9  . atorvastatin (LIPITOR) 10 MG tablet Take 1 tablet (10 mg total) by mouth daily. 30 tablet 2  . Coenzyme Q10 (CO Q 10) 100 MG CAPS Take 1 capsule by mouth daily.     [name] doxycycline (VIBRAMYCIN) 50 MG  capsule Take 1 capsule (50 mg total) by mouth 2 (two) times daily. 14 capsule 0  . Glucosamine-Chondroit-Vit C-Mn (GLUCOSAMINE 1500 COMPLEX PO) Take 1 capsule by mouth 2 (two) times daily.    [name] lidocaine-prilocaine (EMLA) cream Apply to affected area once 30 g 3  . loratadine (CLARITIN) 10 MG tablet Take 10 mg by mouth daily as needed for allergies (hives).    . Multiple Vitamin (MULTIVITAMIN) capsule Take 1 capsule by mouth daily.     . Omega-3 Fatty Acids (OMEGA-3 FISH OIL) 300 MG CAPS Take 1 capsule by mouth daily.     . ondansetron (ZOFRAN) 8 MG tablet Take 1 tablet (8 mg total) by mouth every 8 (eight) hours as needed for refractory nausea / vomiting. 30 tablet 1  . Polyethyl Glycol-Propyl Glycol (SYSTANE ULTRA OP) Apply 1 drop to eye 2 (two) times daily at 10 AM and 5 PM.    . Probiotic Product (PROBIOTIC ADVANCED PO) Take 1 capsule by mouth daily.    . prochlorperazine (COMPAZINE) 10 MG tablet Take 1 tablet (10 mg total) by mouth every 6 (six) hours as needed (Nausea or vomiting). 60 tablet 1  . vitamin E 400 UNIT capsule Take 400 Units by mouth every evening.      No current facility-administered medications for this visit.   Facility-Administered Medications Ordered in Other Visits  Medication Dose Route Frequency Provider Last Rate Last Admin  . sodium chloride flush (NS) 0.9 % injection 10 mL  10 mL Intracatheter PRN Alvy Bimler, Othell Jaime, MD   10 mL at 06/26/20 1155    PHYSICAL EXAMINATION: ECOG PERFORMANCE STATUS: 1 - Symptomatic but completely ambulatory  Vitals:   06/26/20 0937  BP: (!) 144/60  Pulse: (!) 54  Resp: 18  Temp: 97.7 F (36.5 C)  SpO2: 100%   Filed Weights   06/26/20 0937  Weight: 141 lb (64 kg)    GENERAL:alert, no distress and comfortable SKIN: skin color, texture, turgor are normal, no rashes or significant lesions EYES: normal, Conjunctiva are pink and non-injected, sclera clear OROPHARYNX:no exudate, no erythema and lips, buccal mucosa, and tongue normal   NECK: supple, thyroid normal size, non-tender, without nodularity LYMPH:  no palpable lymphadenopathy in the cervical, axillary or inguinal LUNGS: clear to auscultation and percussion with normal breathing effort HEART: regular rate & rhythm and no murmurs and no lower extremity edema ABDOMEN:abdomen soft, non-tender and normal bowel sounds Musculoskeletal:no cyanosis of digits and no clubbing  NEURO: alert & oriented x 3 with fluent speech, no focal motor/sensory deficits  LABORATORY DATA:  I have reviewed the data as listed    Component Value Date/Time   NA 141 06/26/2020 0851   NA 142 11/28/2016 0916  K 3.9 06/26/2020 0851   K 3.9 11/28/2016 0916   CL 109 06/26/2020 0851   CO2 25 06/26/2020 0851   CO2 28 11/28/2016 0916   GLUCOSE 96 06/26/2020 0851   GLUCOSE 78 11/28/2016 0916   BUN 25 (H) 06/26/2020 0851   BUN 16.8 11/28/2016 0916   CREATININE 0.84 06/26/2020 0851   CREATININE 0.8 11/28/2016 0916   CALCIUM 9.5 06/26/2020 0851   CALCIUM 10.5 (H) 11/28/2016 0916   PROT 6.5 06/26/2020 0851   PROT 7.3 11/28/2016 0916   ALBUMIN 3.7 06/26/2020 0851   ALBUMIN 4.3 11/28/2016 0916   AST 29 06/26/2020 0851   AST 19 11/28/2016 0916   ALT 16 06/26/2020 0851   ALT 19 11/28/2016 0916   ALKPHOS 56 06/26/2020 0851   ALKPHOS 50 11/28/2016 0916   BILITOT 0.5 06/26/2020 0851   BILITOT 0.48 11/28/2016 0916   GFRNONAA >60 06/26/2020 0851   GFRAA >60 06/26/2020 0851    No results found for: SPEP, UPEP  Lab Results  Component Value Date   WBC 2.6 (L) 06/26/2020   NEUTROABS 1.2 (L) 06/26/2020   HGB 10.0 (L) 06/26/2020   HCT 30.0 (L) 06/26/2020   MCV 97.1 06/26/2020   PLT 84 (L) 06/26/2020      Chemistry      Component Value Date/Time   NA 141 06/26/2020 0851   NA 142 11/28/2016 0916   K 3.9 06/26/2020 0851   K 3.9 11/28/2016 0916   CL 109 06/26/2020 0851   CO2 25 06/26/2020 0851   CO2 28 11/28/2016 0916   BUN 25 (H) 06/26/2020 0851   BUN 16.8 11/28/2016 0916    CREATININE 0.84 06/26/2020 0851   CREATININE 0.8 11/28/2016 0916      Component Value Date/Time   CALCIUM 9.5 06/26/2020 0851   CALCIUM 10.5 (H) 11/28/2016 0916   ALKPHOS 56 06/26/2020 0851   ALKPHOS 50 11/28/2016 0916   AST 29 06/26/2020 0851   AST 19 11/28/2016 0916   ALT 16 06/26/2020 0851   ALT 19 11/28/2016 0916   BILITOT 0.5 06/26/2020 0851   BILITOT 0.48 11/28/2016 0916

## 2020-07-07 ENCOUNTER — Encounter: Payer: Self-pay | Admitting: Hematology and Oncology

## 2020-07-10 ENCOUNTER — Inpatient Hospital Stay: Payer: Medicare PPO

## 2020-07-10 ENCOUNTER — Other Ambulatory Visit: Payer: Self-pay

## 2020-07-10 VITALS — BP 143/56 | HR 53 | Temp 98.7°F | Resp 18 | Wt 138.8 lb

## 2020-07-10 DIAGNOSIS — Z5111 Encounter for antineoplastic chemotherapy: Secondary | ICD-10-CM | POA: Diagnosis not present

## 2020-07-10 DIAGNOSIS — Z7189 Other specified counseling: Secondary | ICD-10-CM

## 2020-07-10 DIAGNOSIS — C561 Malignant neoplasm of right ovary: Secondary | ICD-10-CM

## 2020-07-10 LAB — CBC WITH DIFFERENTIAL (CANCER CENTER ONLY)
Abs Immature Granulocytes: 0 10*3/uL (ref 0.00–0.07)
Basophils Absolute: 0 10*3/uL (ref 0.0–0.1)
Basophils Relative: 1 %
Eosinophils Absolute: 0.3 10*3/uL (ref 0.0–0.5)
Eosinophils Relative: 9 %
HCT: 31 % — ABNORMAL LOW (ref 36.0–46.0)
Hemoglobin: 10.1 g/dL — ABNORMAL LOW (ref 12.0–15.0)
Immature Granulocytes: 0 %
Lymphocytes Relative: 26 %
Lymphs Abs: 0.8 10*3/uL (ref 0.7–4.0)
MCH: 32.1 pg (ref 26.0–34.0)
MCHC: 32.6 g/dL (ref 30.0–36.0)
MCV: 98.4 fL (ref 80.0–100.0)
Monocytes Absolute: 0.6 10*3/uL (ref 0.1–1.0)
Monocytes Relative: 20 %
Neutro Abs: 1.3 10*3/uL — ABNORMAL LOW (ref 1.7–7.7)
Neutrophils Relative %: 44 %
Platelet Count: 113 10*3/uL — ABNORMAL LOW (ref 150–400)
RBC: 3.15 MIL/uL — ABNORMAL LOW (ref 3.87–5.11)
RDW: 15.3 % (ref 11.5–15.5)
WBC Count: 3 10*3/uL — ABNORMAL LOW (ref 4.0–10.5)
nRBC: 0 % (ref 0.0–0.2)

## 2020-07-10 LAB — CMP (CANCER CENTER ONLY)
ALT: 16 U/L (ref 0–44)
AST: 30 U/L (ref 15–41)
Albumin: 3.6 g/dL (ref 3.5–5.0)
Alkaline Phosphatase: 54 U/L (ref 38–126)
Anion gap: 9 (ref 5–15)
BUN: 19 mg/dL (ref 8–23)
CO2: 24 mmol/L (ref 22–32)
Calcium: 9.7 mg/dL (ref 8.9–10.3)
Chloride: 108 mmol/L (ref 98–111)
Creatinine: 0.82 mg/dL (ref 0.44–1.00)
GFR, Est AFR Am: >60 mL/min (ref >60)
GFR, Estimated: >60 mL/min (ref >60)
Glucose, Bld: 108 mg/dL — ABNORMAL HIGH (ref 70–99)
Potassium: 3.7 mmol/L (ref 3.5–5.1)
Sodium: 141 mmol/L (ref 135–145)
Total Bilirubin: 0.4 mg/dL (ref 0.3–1.2)
Total Protein: 6.5 g/dL (ref 6.5–8.1)

## 2020-07-10 MED ORDER — SODIUM CHLORIDE 0.9 % IV SOLN
500.0000 mg/m2 | Freq: Once | INTRAVENOUS | Status: AC
  Administered 2020-07-10: 836 mg via INTRAVENOUS
  Filled 2020-07-10: qty 21.99

## 2020-07-10 MED ORDER — SODIUM CHLORIDE 0.9 % IV SOLN
Freq: Once | INTRAVENOUS | Status: AC
  Filled 2020-07-10: qty 250

## 2020-07-10 MED ORDER — HEPARIN SOD (PORK) LOCK FLUSH 100 UNIT/ML IV SOLN
500.0000 [IU] | Freq: Once | INTRAVENOUS | Status: AC | PRN
  Administered 2020-07-10: 500 [IU]
  Filled 2020-07-10: qty 5

## 2020-07-10 MED ORDER — PROCHLORPERAZINE MALEATE 10 MG PO TABS
ORAL_TABLET | ORAL | Status: AC
  Filled 2020-07-10: qty 1

## 2020-07-10 MED ORDER — HEPARIN SOD (PORK) LOCK FLUSH 100 UNIT/ML IV SOLN
500.0000 [IU] | Freq: Once | INTRAVENOUS | Status: DC
  Filled 2020-07-10: qty 5

## 2020-07-10 MED ORDER — SODIUM CHLORIDE 0.9% FLUSH
10.0000 mL | Freq: Once | INTRAVENOUS | Status: AC
  Administered 2020-07-10: 10 mL
  Filled 2020-07-10: qty 10

## 2020-07-10 MED ORDER — SODIUM CHLORIDE 0.9% FLUSH
10.0000 mL | INTRAVENOUS | Status: DC | PRN
  Administered 2020-07-10: 10 mL
  Filled 2020-07-10: qty 10

## 2020-07-10 MED ORDER — PROCHLORPERAZINE MALEATE 10 MG PO TABS
10.0000 mg | ORAL_TABLET | Freq: Once | ORAL | Status: AC
  Administered 2020-07-10: 10 mg via ORAL

## 2020-07-10 NOTE — Progress Notes (Signed)
Per Dr. Alvy Bimler: okay to treat with low ANC.

## 2020-07-10 NOTE — Patient Instructions (Signed)
[facility] Discharge Instructions for Patients Receiving Chemotherapy  Today you received the following chemotherapy agents Gemcitabine (GEMZAR).  To help prevent nausea and vomiting after your treatment, we encourage you to take your nausea medication as prescribed.   If you develop nausea and vomiting that is not controlled by your nausea medication, call the clinic.   BELOW ARE SYMPTOMS THAT SHOULD BE REPORTED IMMEDIATELY:  *FEVER GREATER THAN 100.5 F  *CHILLS WITH OR WITHOUT FEVER  NAUSEA AND VOMITING THAT IS NOT CONTROLLED WITH YOUR NAUSEA MEDICATION  *UNUSUAL SHORTNESS OF BREATH  *UNUSUAL BRUISING OR BLEEDING  TENDERNESS IN MOUTH AND THROAT WITH OR WITHOUT PRESENCE OF ULCERS  *URINARY PROBLEMS  *BOWEL PROBLEMS  UNUSUAL RASH Items with * indicate a potential emergency and should be followed up as soon as possible.  Feel free to call the clinic should you have any questions or concerns. The clinic phone number is [phone].  Please show the CHEMO ALERT CARD at check-in to the Emergency Department and triage nurse.   

## 2020-07-16 ENCOUNTER — Encounter: Payer: Self-pay | Admitting: Hematology and Oncology

## 2020-07-16 ENCOUNTER — Encounter (HOSPITAL_COMMUNITY): Payer: Self-pay

## 2020-07-16 ENCOUNTER — Other Ambulatory Visit: Payer: Self-pay

## 2020-07-16 ENCOUNTER — Ambulatory Visit (HOSPITAL_COMMUNITY)
Admission: RE | Admit: 2020-07-16 | Discharge: 2020-07-16 | Disposition: A | Discharge status: Home / Self Care | Payer: Medicare PPO | Source: Ambulatory Visit | Attending: Hematology and Oncology | Admitting: Hematology and Oncology

## 2020-07-16 DIAGNOSIS — C787 Secondary malignant neoplasm of liver and intrahepatic bile duct: Secondary | ICD-10-CM | POA: Diagnosis present

## 2020-07-16 DIAGNOSIS — C561 Malignant neoplasm of right ovary: Secondary | ICD-10-CM | POA: Diagnosis present

## 2020-07-16 MED ORDER — SODIUM CHLORIDE (PF) 0.9 % IJ SOLN
INTRAMUSCULAR | Status: AC
  Filled 2020-07-16: qty 50

## 2020-07-16 MED ORDER — IOHEXOL 9 MG/ML PO SOLN
1000.0000 mL | ORAL | Status: AC
  Administered 2020-07-16: 1000 mL via ORAL

## 2020-07-16 MED ORDER — IOHEXOL 300 MG/ML  SOLN
100.0000 mL | Freq: Once | INTRAMUSCULAR | Status: AC | PRN
  Administered 2020-07-16: 100 mL via INTRAVENOUS

## 2020-07-16 MED ORDER — HEPARIN SOD (PORK) LOCK FLUSH 100 UNIT/ML IV SOLN
INTRAVENOUS | Status: AC
  Filled 2020-07-16: qty 5

## 2020-07-16 MED ORDER — HEPARIN SOD (PORK) LOCK FLUSH 100 UNIT/ML IV SOLN
500.0000 [IU] | Freq: Once | INTRAVENOUS | Status: AC
  Administered 2020-07-16: 500 [IU] via INTRAVENOUS

## 2020-07-16 MED ORDER — IOHEXOL 9 MG/ML PO SOLN
ORAL | Status: AC
  Filled 2020-07-16: qty 1000

## 2020-07-17 ENCOUNTER — Inpatient Hospital Stay: Payer: Medicare PPO | Admitting: Hematology and Oncology

## 2020-07-17 ENCOUNTER — Other Ambulatory Visit: Payer: Self-pay

## 2020-07-17 DIAGNOSIS — Z7189 Other specified counseling: Secondary | ICD-10-CM | POA: Diagnosis not present

## 2020-07-17 DIAGNOSIS — C561 Malignant neoplasm of right ovary: Secondary | ICD-10-CM

## 2020-07-17 DIAGNOSIS — Z5111 Encounter for antineoplastic chemotherapy: Secondary | ICD-10-CM | POA: Diagnosis not present

## 2020-07-18 ENCOUNTER — Encounter: Payer: Self-pay | Admitting: Hematology and Oncology

## 2020-07-18 NOTE — Assessment & Plan Note (Signed)
We reviewed the imaging studies Unfortunately she has signs of disease progression She is currently not symptomatic We have extensive discussions about treatment options and goals of care I explained to her why surgery and radiation is not indicated We discussed roles of palliative chemotherapy and side-effects to be expected from taxotere or topotecan I have also reviewed the phase 2 trial paper she posted on mychart message  Ultimately, she would like to have further discussions with her family but she is leaning towards palliative care/hospice We discussed future follow-up and she is undecided She will like to keep her port in I recommend seeing her back in a month for labs and port flush and further discussion I will proceed to make referral to palliative care program

## 2020-07-18 NOTE — Progress Notes (Signed)
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[facility]) We reviewed the imaging studies Unfortunately she has signs of disease progression She is currently not symptomatic We have extensive discussions about treatment options and goals of care I explained to her why surgery and radiation is not indicated We discussed roles of palliative chemotherapy and side-effects to be expected from taxotere or topotecan I have also reviewed the phase 2 trial paper she posted on mychart message  Ultimately, she would like to have further discussions with her family but she is leaning towards palliative care/hospice We discussed future follow-up and she is undecided She will like to keep her port in I recommend seeing her back in a month for labs and port flush and further discussion I will proceed to make referral to palliative care program  Goals of care, counseling/discussion We have extensive goals of care discussions She would like to understand the plan of care, the role of palliative care, hospice, etc. We discussed prognosis with or without treatment   No orders of the defined types were placed in this encounter.   All questions were answered. The patient knows to call the clinic with any problems, questions or concerns. The total time spent in the appointment was 55 minutes encounter with patients including review of chart and various tests results, discussions about plan of care and coordination of care plan   Heath Lark, MD 07/18/2020 1:01 PM  INTERVAL HISTORY: Please see below for problem oriented charting. She returns for review of imaging studies She has many questions about roles of surgery, radiation or clinical trial She is not symptomatic from her abdominal disease  SUMMARY OF ONCOLOGIC HISTORY: Oncology History Overview Note  Neg genetics.  Additional tests revealed ER: 80%, PR 3% MSI stable Progressed on Avastin, Doxil, Taxol and Gemzar Allergic to carboplatin   Right ovarian epithelial cancer ([place])  07/01/2015 Initial Diagnosis   She presented with postmenopausal bleeding   07/02/2015 Imaging   US pelvis showed bilateral ovarian cyst   07/13/2015 Imaging   5.2 cm left adnexal cystic lesion, with indeterminate but probably benign characteristics. In a postmenopausal female, consider continued annual imaging followup with CT or MRI versus surgical evaluation.  Colonic diverticulosis. No radiographic evidence of diverticulitis.  Cholelithiasis.  No radiographic evidence of cholecystitis.  Small hiatal hernia.   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  The tumor involves serosal surface of the right ovary and adjacent right fallopian tube.  Cervix and endometrium were within normal limits. ER positive Peritoneal washing was positive.   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   08/13/2015 Tumor Marker   Patient's tumor was tested for the following markers: CA-125 Results of the tumor marker test revealed 96   08/25/2015 - 12/08/2015 Chemotherapy   The patient had 6 cycles of carboplatin and taxol   09/07/2015 Tumor Marker   Patient's tumor was tested for the following markers: CA-125 Results of the tumor marker test revealed 51   10/05/2015 Tumor Marker   Patient's tumor was tested for the following markers: CA-125 Results of the tumor marker test revealed 29   11/09/2015 Tumor  Marker   Patient's tumor was tested for the following markers: CA-125 Results of the tumor marker test revealed 44    12/08/2015 Tumor Marker   Patient's tumor was tested for the following markers: CA-125 Results of the tumor marker test revealed 30   12/15/2015 Genetic Testing   Genetics testing normal by [facility] Breast Ovarian panel 12-15-15   01/11/2016 Imaging   1. The only finding of note are several adjacent peripheral abnormal hypodensities along the anterior superior spleen margin, differential diagnostic considerations including small peripheral interval splenic infarcts, splenic injury with subcapsular a fluid collections, or less likely metastatic disease to the splenic margin. This likely merits observation. 2. Other imaging findings of potential clinical significance: Small type 1 hiatal hernia. Aortoiliac atherosclerotic vascular disease. Lower lumbar spondylosis and degenerative disc disease. Gallstones with potential mild gallbladder wall thickening.   03/07/2016 Tumor Marker   Patient's tumor was tested for the following markers: CA-125 Results of the tumor marker test revealed 24.2   04/06/2016 Imaging   1. No evidence of a ovarian cancer metastasis. 2. No ascites. 3. Post hysterectomy and oophorectomy. 4. Cholelithiasis with several large gallstones   04/06/2016 Tumor Marker   Patient's tumor was tested for the following markers: CA-125 Results of the tumor marker test revealed 22.8   05/30/2016 Tumor Marker   Patient's tumor was tested for the following markers: CA-125 Results of the tumor marker test revealed 21   09/07/2016 Tumor Marker   Patient's tumor was tested for the following markers: CA-125 Results of the tumor marker test revealed 22.4   11/28/2016 Tumor Marker   Patient's tumor was tested for the following markers: CA-125 Results of the tumor marker test revealed 18.8   02/23/2017 Tumor Marker   Patient's tumor was tested for the following markers: CA-125 Results of the tumor marker test revealed 19.1   06/02/2017 Tumor Marker   Patient's tumor was tested for the  following markers: CA-125 Results of the tumor marker test revealed 18.3   08/24/2017 Mammogram   Pt reports mammogram complete   08/28/2017 Tumor Marker   Patient's tumor was tested for the following markers: CA-125 Results of the tumor marker test revealed 21.1   12/01/2017 Tumor Marker   Patient's tumor was tested for the following markers: CA-125 Results of the tumor marker test revealed 31.2   12/13/2017 Imaging   New 2.7 cm peritoneal soft tissue mass in anterior left lower quadrant, consistent with metastatic disease.  No other sites of metastatic disease identified.  Incidental findings including: Cholelithiasis. Colonic diverticulosis. Tiny hiatal hernia. Aortic atherosclerosis.   01/09/2018 - 05/07/2018 Chemotherapy   The patient had carboplatin and taxol; After 2nd cycle, she developed carboplatin allergy. She received carboplatin desensitization protocol at [facility] for final 4 cycles, completed by 6/17. Avastin was added from 04/16/18 onwards   01/30/2018 Adverse Reaction   Potential carboplatin allergy is suspected. She received half the dose of prescribed carboplatin on cycle 2   05/31/2018 Tumor Marker   Patient's tumor was tested for the following markers: CA-125 Results of the tumor marker test revealed 26   05/31/2018 Imaging   1. Peritoneal soft tissue nodule identified on the previous study has decreased in the interval the. No progressive findings in the abdomen or pelvis on today's study to suggest disease progression. 2. Tiny pulmonary nodules, likely benign. Attention on follow-up recommended. 3. Cholelithiasis. 4.  Aortic Atherosclerois (ICD10-170.0) 5. Tiny hiatal hernia.     06/04/2018 - 07/30/2019 Chemotherapy   The patient  is placed on Avastin for maintenance   06/25/2018 Tumor Marker   Patient's tumor was tested for the following markers: CA-125 Results of the tumor marker test revealed 22.5   08/06/2018 Tumor Marker   Patient's tumor was tested for the  following markers: CA-125 Results of the tumor marker test revealed 20.7   09/14/2018 Imaging   Status post hysterectomy and bilateral salpingo-oophorectomy.  Stable soft tissue nodule beneath the left lower anterior abdominal wall, likely reflecting stable peritoneal disease.  Scattered small subpleural nodules in the lungs bilaterally, measuring up to 3 mm, technically indeterminate although likely benign. Please note that Fleischner Society guidelines do not apply. Attention on follow-up is suggested.  No evidence of new/progressive metastatic disease.   09/14/2018 Tumor Marker   Patient's tumor was tested for the following markers: CA-125 Results of the tumor marker test revealed 21.3   10/30/2018 Tumor Marker   Patient's tumor was tested for the following markers: CA-125 Results of the tumor marker test revealed 20.8   12/12/2018 Tumor Marker   Patient's tumor was tested for the following markers: CA-125 Results of the tumor marker test revealed 19.7   01/01/2019 Tumor Marker   Patient's tumor was tested for the following markers: CA-125 Results of the tumor marker test revealed 21.6   01/22/2019 Tumor Marker   Patient's tumor was tested for the following markers: CA-125 Results of the tumor marker test revealed 21.3   02/12/2019 Tumor Marker   Patient's tumor was tested for the following markers: CA-125 Results of the tumor marker test revealed 21.6   04/16/2019 Tumor Marker   Patient's tumor was tested for the following markers: CA-125 Results of the tumor marker test revealed 21.7   05/07/2019 Tumor Marker   Patient's tumor was tested for the following markers: CA-125 Results of the tumor marker test revealed 27.3   05/28/2019 Tumor Marker   Patient's tumor was tested for the following markers: CA-125 Results of the tumor marker test revealed 24.5   07/09/2019 - 08/19/2019 Chemotherapy   The patient had bevacizumab for chemotherapy treatment.     07/09/2019 Tumor  Marker   Patient's tumor was tested for the following markers: CA-125 Results of the tumor marker test revealed 23.3.   08/06/2019 Imaging   Ct abdomen and pelvis 1. New hypodense lesions of the left lobe of the liver measuring 2.5 x 2.5 cm (series 2, image 16) and right lobe of the liver measuring 1.1 x 1.1 cm (series 2, image 23), concerning for metastatic disease.   2. Interval enlargement of an irregular nodule of the omentum measuring 1.0 x 1.0 cm, previously 1.0 x 0.5 cm (series 2, image 51), concerning for peritoneal disease.   3.  Status post hysterectomy.   4. Other chronic and incidental findings as detailed above. Aortic Atherosclerosis (ICD10-I70.0).   08/16/2019 - 01/17/2020 Chemotherapy   The patient had weekly Taxol for chemotherapy treatment.     08/23/2019 Tumor Marker   Patient's tumor was tested for the following markers: CA-125 Results of the tumor marker test revealed 33.7.   09/06/2019 Tumor Marker   Patient's tumor was tested for the following markers: CA-125 Results of the tumor marker test revealed 32.2   10/14/2019 Imaging   1. Interval decrease in hypodense masses of the left lobe and right lobe of the liver, mass in the left lobe measuring 1.7 x 1.6 cm, previously 2.5 x 2.5 cm (series 2, image 14) and in the right lobe of the liver measuring 0.8 x  0.7 cm, previously 1.1 x 1.1 cm (series 2, image 22).   2. Interval decrease in size of an omental nodule, measuring 0.8 x 0.7 cm, previously 1.0 x 1.0 cm (series 2, image 55).   3. Findings are consistent with improved metastatic disease. No new evidence of metastatic disease in the abdomen or pelvis.   4.  Status post hysterectomy.   5.  Cholelithiasis.   6.  Aortic Atherosclerosis (ICD10-I70.0).   01/23/2020 Imaging   1. 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 2. Slight increase in size of left lower quadrant omental soft tissue nodule, consistent with metastatic disease. 3. No new sites of  metastatic disease identified. 4. Stable cholelithiasis and gallbladder wall thickening. No evidence of acute cholecystitis. 5. Colonic diverticulosis. No radiographic evidence of diverticulitis. 6. Tiny hiatal hernia.   01/28/2020 Echocardiogram    1. Left ventricular ejection fraction, by estimation, is 60 to 65%. The left ventricle has normal function. The left ventricle has no regional wall motion abnormalities. Left ventricular diastolic parameters are consistent with Grade I diastolic dysfunction (impaired relaxation). The average left ventricular global longitudinal strain is -19.7 % (normal), however this may be underestimated given suboptimal tracking.  2. Right ventricular systolic function is normal. The right ventricular size is normal.  3. The mitral valve is normal in structure. Mild mitral valve regurgitation. No evidence of mitral stenosis.  4. The aortic valve is normal in structure. Aortic valve regurgitation is not visualized. No aortic stenosis is present.  5. The inferior vena cava is normal in size with greater than 50% respiratory variability, suggesting right atrial pressure of 3 mmHg.   Conclusion(s)/Recommendation(s): Normal biventricular function without evidence of hemodynamically significant valvular heart disease   01/31/2020 - 04/23/2020 Chemotherapy   The patient had DOXOrubicin for chemotherapy treatment.     04/16/2020 Imaging   1. Mild increase in size of metastasis involving the left hepatic lobe. 2. Stable small left lower quadrant omental soft tissue nodule, suspicious for peritoneal metastasis. 3. No new sites of metastatic disease identified. 4. Cholelithiasis. No radiographic evidence of cholecystitis. 5. Colonic diverticulosis. No radiographic evidence of diverticulitis. 6. Small hiatal hernia.   Aortic Atherosclerosis (ICD10-I70.0).   05/01/2020 -  Chemotherapy   The patient had gemcitabine for chemotherapy treatment.     07/16/2020 Imaging   1. New  hepatic metastasis in the RIGHT hepatic lobe. Slight enlargement of the larger metastatic lesion in the LEFT hepatic lobe. 2. Mild increase in volume of small LEFT lower quadrant peritoneal nodule. 3. No ascites. 4. Chronic enhancement of the gallbladder fundus.  Cholelithiasis.     REVIEW OF SYSTEMS:   Constitutional: Denies fevers, chills or abnormal weight loss Eyes: Denies blurriness of vision Ears, nose, mouth, throat, and face: Denies mucositis or sore throat Respiratory: Denies cough, dyspnea or wheezes Cardiovascular: Denies palpitation, chest discomfort or lower extremity swelling Gastrointestinal:  Denies nausea, heartburn or change in bowel habits Skin: Denies abnormal skin rashes Lymphatics: Denies new lymphadenopathy or easy bruising Neurological:Denies numbness, tingling or new weaknesses Behavioral/Psych: Mood is stable, no new changes  All other systems were reviewed with the patient and are negative.  I have reviewed the past medical history, past surgical history, social history and family history with the patient and they are unchanged from previous note.  ALLERGIES:  is allergic to carboplatin.  MEDICATIONS:  Current Outpatient Medications  Medication Sig Dispense Refill  . amLODipine (NORVASC) 10 MG tablet Take 1 tablet (10 mg total) by mouth  daily. 90 tablet 3  . atenolol (TENORMIN) 25 MG tablet Take 1 tablet (25 mg total) by mouth daily. 30 tablet 9  . atorvastatin (LIPITOR) 10 MG tablet Take 1 tablet (10 mg total) by mouth daily. 30 tablet 2  . Coenzyme Q10 (CO Q 10) 100 MG CAPS Take 1 capsule by mouth daily.     [name] doxycycline (VIBRAMYCIN) 50 MG capsule Take 1 capsule (50 mg total) by mouth 2 (two) times daily. 14 capsule 0  . Glucosamine-Chondroit-Vit C-Mn (GLUCOSAMINE 1500 COMPLEX PO) Take 1 capsule by mouth 2 (two) times daily.    [name] lidocaine-prilocaine (EMLA) cream Apply to affected area once 30 g 3  . loratadine (CLARITIN) 10 MG tablet Take 10 mg by  mouth daily as needed for allergies (hives).    . Multiple Vitamin (MULTIVITAMIN) capsule Take 1 capsule by mouth daily.     . Omega-3 Fatty Acids (OMEGA-3 FISH OIL) 300 MG CAPS Take 1 capsule by mouth daily.     . ondansetron (ZOFRAN) 8 MG tablet Take 1 tablet (8 mg total) by mouth every 8 (eight) hours as needed for refractory nausea / vomiting. 30 tablet 1  . Polyethyl Glycol-Propyl Glycol (SYSTANE ULTRA OP) Apply 1 drop to eye 2 (two) times daily at 10 AM and 5 PM.    . Probiotic Product (PROBIOTIC ADVANCED PO) Take 1 capsule by mouth daily.    . prochlorperazine (COMPAZINE) 10 MG tablet Take 1 tablet (10 mg total) by mouth every 6 (six) hours as needed (Nausea or vomiting). 60 tablet 1  . vitamin E 400 UNIT capsule Take 400 Units by mouth every evening.      No current facility-administered medications for this visit.    PHYSICAL EXAMINATION: ECOG PERFORMANCE STATUS: 1 - Symptomatic but completely ambulatory  Vitals:   07/17/20 1144  BP: (!) 136/58  Pulse: 65  Resp: 16  Temp: 98.5 F (36.9 C)  SpO2: 99%   Filed Weights   07/17/20 1144  Weight: 139 lb 12.8 oz (63.4 kg)    GENERAL:alert, no distress and comfortable NEURO: alert & oriented x 3 with fluent speech, no focal motor/sensory deficits  LABORATORY DATA:  I have reviewed the data as listed    Component Value Date/Time   NA 141 07/10/2020 0940   NA 142 11/28/2016 0916   K 3.7 07/10/2020 0940   K 3.9 11/28/2016 0916   CL 108 07/10/2020 0940   CO2 24 07/10/2020 0940   CO2 28 11/28/2016 0916   GLUCOSE 108 (H) 07/10/2020 0940   GLUCOSE 78 11/28/2016 0916   BUN 19 07/10/2020 0940   BUN 16.8 11/28/2016 0916   CREATININE 0.82 07/10/2020 0940   CREATININE 0.8 11/28/2016 0916   CALCIUM 9.7 07/10/2020 0940   CALCIUM 10.5 (H) 11/28/2016 0916   PROT 6.5 07/10/2020 0940   PROT 7.3 11/28/2016 0916   ALBUMIN 3.6 07/10/2020 0940   ALBUMIN 4.3 11/28/2016 0916   AST 30 07/10/2020 0940   AST 19 11/28/2016 0916   ALT 16  07/10/2020 0940   ALT 19 11/28/2016 0916   ALKPHOS 54 07/10/2020 0940   ALKPHOS 50 11/28/2016 0916   BILITOT 0.4 07/10/2020 0940   BILITOT 0.48 11/28/2016 0916   GFRNONAA >60 07/10/2020 0940   GFRAA >60 07/10/2020 0940    No results found for: SPEP, UPEP  Lab Results  Component Value Date   WBC 3.0 (L) 07/10/2020   NEUTROABS 1.3 (L) 07/10/2020   HGB 10.1 (L) 07/10/2020   HCT  31.0 (L) 07/10/2020   MCV 98.4 07/10/2020   PLT 113 (L) 07/10/2020      Chemistry      Component Value Date/Time   NA 141 07/10/2020 0940   NA 142 11/28/2016 0916   K 3.7 07/10/2020 0940   K 3.9 11/28/2016 0916   CL 108 07/10/2020 0940   CO2 24 07/10/2020 0940   CO2 28 11/28/2016 0916   BUN 19 07/10/2020 0940   BUN 16.8 11/28/2016 0916   CREATININE 0.82 07/10/2020 0940   CREATININE 0.8 11/28/2016 0916      Component Value Date/Time   CALCIUM 9.7 07/10/2020 0940   CALCIUM 10.5 (H) 11/28/2016 0916   ALKPHOS 54 07/10/2020 0940   ALKPHOS 50 11/28/2016 0916   AST 30 07/10/2020 0940   AST 19 11/28/2016 0916   ALT 16 07/10/2020 0940   ALT 19 11/28/2016 0916   BILITOT 0.4 07/10/2020 0940   BILITOT 0.48 11/28/2016 0916       RADIOGRAPHIC STUDIES:I have reviewed multiple imaging studies with her I have personally reviewed the radiological images as listed and agreed with the findings in the report. CT ABDOMEN PELVIS W CONTRAST  Result Date: 07/16/2020 CLINICAL DATA:  Ovarian cancer with liver metastasis. Chemotherapy in progress. Surveillance exam. EXAM: CT ABDOMEN AND PELVIS WITH CONTRAST TECHNIQUE: Multidetector CT imaging of the abdomen and pelvis was performed using the standard protocol following bolus administration of intravenous contrast. CONTRAST:  110m OMNIPAQUE IOHEXOL 300 MG/ML  SOLN COMPARISON:  CT 04/16/2020, 01/23/2020 FINDINGS: Lower chest: Lung bases are clear. Hepatobiliary: Again demonstrated large hypoenhancing mass in the LEFT hepatic lobe which is increased modestly in size  compared to prior measuring 3.6 x 3.3 cm (image 12/series 2) compared with 3.4 x 3.1 cm. New hypoenhancing lesion in the RIGHT hepatic lobe measures 1.6 x 1.4 cm (image 20/2). Tiny hypodense lesion in the dome the RIGHT hepatic lobe (image 8/2) is indeterminate. Again demonstrated gallstones and enhancing fundal thickening of the gallbladder. No interval change over multiple comparison exam. Pancreas: Pancreas is normal. No ductal dilatation. No pancreatic inflammation. Spleen: Normal spleen Adrenals/urinary tract: Adrenal glands and kidneys are normal. The ureters and bladder normal. Stomach/Bowel: Small hiatal hernia. Stomach, small bowel: Unremarkable. Vascular/Lymphatic: Abdominal aorta is normal caliber with atherosclerotic calcification. There is no retroperitoneal or periportal lymphadenopathy. No pelvic lymphadenopathy. Reproductive: Post hysterectomy.  Adnexa unremarkable Other: Ventral peritoneal nodule LEFT lower quadrant measures 14 mm x 10 mm compared to 13 mm x 8 mm. Visually the lesion appears slightly enlarged. No additional peritoneal nodularity identified. Musculoskeletal: No aggressive osseous lesion. IMPRESSION: 1. 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 Slight enlargement of the larger metastatic lesion in the LEFT hepatic lobe. 2. Mild increase in volume of small LEFT lower quadrant peritoneal nodule. 3. No ascites. 4. Chronic enhancement of the gallbladder fundus.  Cholelithiasis. Electronically Signed   By: SSuzy BouchardM.D.   On: 07/16/2020 11:29

## 2020-07-18 NOTE — Assessment & Plan Note (Signed)
We have extensive goals of care discussions She would like to understand the plan of care, the role of palliative care, hospice, etc. We discussed prognosis with or without treatment

## 2020-07-20 ENCOUNTER — Telehealth: Payer: Self-pay

## 2020-07-20 NOTE — Telephone Encounter (Signed)
Referral called in for Palliative Care to [facility]. Spoke to [place] in admissions.

## 2020-07-21 ENCOUNTER — Encounter: Payer: Self-pay | Admitting: Hematology and Oncology

## 2020-07-22 ENCOUNTER — Encounter: Payer: Self-pay | Admitting: Hematology and Oncology

## 2020-07-24 ENCOUNTER — Telehealth: Payer: Self-pay

## 2020-07-24 NOTE — Telephone Encounter (Addendum)
Nurse from [facility] called she stated that above Pt would like to go to hospice care instead of palliative care she stated she still wants to keep appointment with you on 08/14/20 because she wants her husband to understand the dynamics of her decision about not going further with treatment. Nurse wants to know if this is ok with you to go ahead with hospice care. Per Dr Alvy Bimler ok to switch to Hospice Care. TC to Antonieta Iba [phone] Hospice Nurse left message to proceed with hospice care.

## 2020-08-12 ENCOUNTER — Encounter: Payer: Self-pay | Admitting: Hematology and Oncology

## 2020-08-14 ENCOUNTER — Inpatient Hospital Stay

## 2020-08-14 ENCOUNTER — Other Ambulatory Visit: Payer: Self-pay

## 2020-08-14 ENCOUNTER — Inpatient Hospital Stay: Attending: Gynecology | Admitting: Hematology and Oncology

## 2020-08-14 ENCOUNTER — Encounter: Payer: Self-pay | Admitting: Hematology and Oncology

## 2020-08-14 DIAGNOSIS — C561 Malignant neoplasm of right ovary: Secondary | ICD-10-CM | POA: Diagnosis not present

## 2020-08-14 DIAGNOSIS — C787 Secondary malignant neoplasm of liver and intrahepatic bile duct: Secondary | ICD-10-CM | POA: Diagnosis present

## 2020-08-14 DIAGNOSIS — Z7189 Other specified counseling: Secondary | ICD-10-CM | POA: Insufficient documentation

## 2020-08-14 DIAGNOSIS — D61818 Other pancytopenia: Secondary | ICD-10-CM | POA: Insufficient documentation

## 2020-08-14 LAB — CMP (CANCER CENTER ONLY)
ALT: 19 U/L (ref 0–44)
AST: 35 U/L (ref 15–41)
Albumin: 3.9 g/dL (ref 3.5–5.0)
Alkaline Phosphatase: 68 U/L (ref 38–126)
Anion gap: 7 (ref 5–15)
BUN: 20 mg/dL (ref 8–23)
CO2: 28 mmol/L (ref 22–32)
Calcium: 9.7 mg/dL (ref 8.9–10.3)
Chloride: 105 mmol/L (ref 98–111)
Creatinine: 0.85 mg/dL (ref 0.44–1.00)
GFR, Est AFR Am: >60 mL/min (ref >60)
GFR, Estimated: >60 mL/min (ref >60)
Glucose, Bld: 92 mg/dL (ref 70–99)
Potassium: 4 mmol/L (ref 3.5–5.1)
Sodium: 140 mmol/L (ref 135–145)
Total Bilirubin: 0.4 mg/dL (ref 0.3–1.2)
Total Protein: 7.1 g/dL (ref 6.5–8.1)

## 2020-08-14 LAB — CBC WITH DIFFERENTIAL (CANCER CENTER ONLY)
Abs Immature Granulocytes: 0.01 10*3/uL (ref 0.00–0.07)
Basophils Absolute: 0.1 10*3/uL (ref 0.0–0.1)
Basophils Relative: 1 %
Eosinophils Absolute: 0.2 10*3/uL (ref 0.0–0.5)
Eosinophils Relative: 5 %
HCT: 34.8 % — ABNORMAL LOW (ref 36.0–46.0)
Hemoglobin: 11.7 g/dL — ABNORMAL LOW (ref 12.0–15.0)
Immature Granulocytes: 0 %
Lymphocytes Relative: 27 %
Lymphs Abs: 1.3 10*3/uL (ref 0.7–4.0)
MCH: 32 pg (ref 26.0–34.0)
MCHC: 33.6 g/dL (ref 30.0–36.0)
MCV: 95.1 fL (ref 80.0–100.0)
Monocytes Absolute: 0.6 10*3/uL (ref 0.1–1.0)
Monocytes Relative: 12 %
Neutro Abs: 2.7 10*3/uL (ref 1.7–7.7)
Neutrophils Relative %: 55 %
Platelet Count: 124 10*3/uL — ABNORMAL LOW (ref 150–400)
RBC: 3.66 MIL/uL — ABNORMAL LOW (ref 3.87–5.11)
RDW: 13.3 % (ref 11.5–15.5)
WBC Count: 4.9 10*3/uL (ref 4.0–10.5)
nRBC: 0 % (ref 0.0–0.2)

## 2020-08-14 MED ORDER — HEPARIN SOD (PORK) LOCK FLUSH 100 UNIT/ML IV SOLN
500.0000 [IU] | Freq: Once | INTRAVENOUS | Status: AC
  Administered 2020-08-14: 500 [IU]
  Filled 2020-08-14: qty 5

## 2020-08-14 MED ORDER — SODIUM CHLORIDE 0.9% FLUSH
10.0000 mL | Freq: Once | INTRAVENOUS | Status: AC
  Administered 2020-08-14: 10 mL
  Filled 2020-08-14: qty 10

## 2020-08-14 NOTE — Assessment & Plan Note (Signed)
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

## 2020-08-14 NOTE — Assessment & Plan Note (Addendum)
We have extensive discussions about goals of care She is currently enrolled in palliative care/hospice program through [facility] We discussed expected prognosis We discussed CODE STATUS The patient is not ready to sign onto DNR status We discussed various treatment conditions that could happen in the setting of terminal care The patient does not want to be resuscitated in the event of multiorgan failure I recommend the patient to seriously consider the value of DNR status while on hospice program  We discussed the role of maintenance of port patency with port flushes or port removal For now, I do not recommend her to get routine port flushes

## 2020-08-14 NOTE — Progress Notes (Signed)
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[facility]) We have extensive discussions about treatment options and plan of care I addressed multiple questions from the patient and her husband He is wondering whether immunotherapy would be an appropriate treatment option for her He is also wondering about the benefit of clinical trials We have performed MSI testing in the past which came back MSI stable To get immunotherapy approved, we will have to repeat biopsy for PD-L1 testing or to order additional molecular testing such as [facility] One In my experience, the likelihood of this disease being PD-L1 positive is low We discussed various phases of clinical trials, what it means to be enrolled in phase 1, phase 2 of phase 3 clinical trials For now, we do not have any clinical trials available for her type of condition Ultimately, she is satisfied with explanation We also reviewed other treatment that is documented and available at the NCCN guidelines She is not interested to pursue those treatment options for now  Pancytopenia, acquired [facility]) Her pancytopenia has improved with discontinuation of treatment She is not symptomatic Observe for now  Goals of care, counseling/discussion We have extensive discussions about goals of care She is currently enrolled in palliative care/hospice program through [place] [facility] We discussed expected prognosis We discussed CODE STATUS The patient is not ready to sign onto DNR status We discussed various treatment conditions that could happen in the setting of terminal care The patient does not want to be resuscitated in the event of multiorgan failure I recommend the patient to seriously consider the value of DNR status while on hospice program  We discussed the role of maintenance of port patency with port flushes or port  removal For now, I do not recommend her to get routine port flushes   No orders of the defined types were placed in this encounter.   All questions were answered. The patient knows to call the clinic with any problems, questions or concerns. The total time spent in the appointment was 40 minutes encounter with patients including review of chart and various tests results, discussions about plan of care and coordination of care plan   Heath Lark, MD 08/14/2020 3:24 PM  INTERVAL HISTORY: Please see below for problem oriented charting. She returns with her husband, Darnell Level for further follow-up I reviewed all her MyChart messages with various statements, comments and questions related to plan of care, treatment options and goals of care Her husband is wondering about the role of immunotherapy and whether she is a candidate to receive immunotherapy We also discussed about the role of palliative care/hospice Currently, she denies abdominal pain, nausea or changes in bowel habits  SUMMARY OF ONCOLOGIC HISTORY: Oncology History Overview Note  Neg genetics. Additional tests revealed ER: 80%, PR 3% MSI stable Progressed on Avastin, Doxil, Taxol and Gemzar Allergic to carboplatin   Right ovarian epithelial cancer ([place])  07/01/2015 Initial Diagnosis   She presented with postmenopausal bleeding   07/02/2015 Imaging   US pelvis showed bilateral ovarian cyst   07/13/2015 Imaging   5.2 cm left adnexal cystic lesion, with indeterminate but probably benign characteristics. In a postmenopausal female, consider continued annual imaging followup with CT or MRI versus surgical evaluation.  Colonic diverticulosis. No radiographic evidence of diverticulitis.  Cholelithiasis.  No radiographic evidence of cholecystitis.  Small hiatal hernia.   07/30/2015 Pathology Results  Outside pathology showed right ovary with high-grade serous carcinoma, 2 cm in maximum dimension.  The tumor involves serosal  surface of the right ovary and adjacent right fallopian tube.  Cervix and endometrium were within normal limits. ER positive Peritoneal washing was positive.   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   08/13/2015 Tumor Marker   Patient's tumor was tested for the following markers: CA-125 Results of the tumor marker test revealed 96   08/25/2015 - 12/08/2015 Chemotherapy   The patient had 6 cycles of carboplatin and taxol   09/07/2015 Tumor Marker   Patient's tumor was tested for the following markers: CA-125 Results of the tumor marker test revealed 51   10/05/2015 Tumor Marker   Patient's tumor was tested for the following markers: CA-125 Results of the tumor marker test revealed 29   11/09/2015 Tumor Marker   Patient's tumor was tested for the following markers: CA-125 Results of the tumor marker test revealed 44   12/08/2015 Tumor Marker   Patient's tumor was tested for the following markers: CA-125 Results of the tumor marker test revealed 30   12/15/2015 Genetic Testing   Genetics testing normal by [facility] Breast Ovarian panel 12-15-15   01/11/2016 Imaging   1. The only finding of note are several adjacent peripheral abnormal hypodensities along the anterior superior spleen margin, differential diagnostic considerations including small peripheral interval splenic infarcts, splenic injury with subcapsular a fluid collections, or less likely metastatic disease to the splenic margin. This likely merits observation. 2. Other imaging findings of potential clinical significance: Small type 1 hiatal hernia. Aortoiliac atherosclerotic vascular disease. Lower lumbar spondylosis and degenerative disc disease. Gallstones with potential mild gallbladder wall  thickening.   03/07/2016 Tumor Marker   Patient's tumor was tested for the following markers: CA-125 Results of the tumor marker test revealed 24.2   04/06/2016 Imaging   1. No evidence of a ovarian cancer metastasis. 2. No ascites. 3. Post hysterectomy and oophorectomy. 4. Cholelithiasis with several large gallstones   04/06/2016 Tumor Marker   Patient's tumor was tested for the following markers: CA-125 Results of the tumor marker test revealed 22.8   05/30/2016 Tumor Marker   Patient's tumor was tested for the following markers: CA-125 Results of the tumor marker test revealed 21   09/07/2016 Tumor Marker   Patient's tumor was tested for the following markers: CA-125 Results of the tumor marker test revealed 22.4   11/28/2016 Tumor Marker   Patient's tumor was tested for the following markers: CA-125 Results of the tumor marker test revealed 18.8   02/23/2017 Tumor Marker   Patient's tumor was tested for the following markers: CA-125 Results of the tumor marker test revealed 19.1   06/02/2017 Tumor Marker   Patient's tumor was tested for the following markers: CA-125 Results of the tumor marker test revealed 18.3   08/24/2017 Mammogram   Pt reports mammogram complete   08/28/2017 Tumor Marker   Patient's tumor was tested for the following markers: CA-125 Results of the tumor marker test revealed 21.1   12/01/2017 Tumor Marker   Patient's tumor was tested for the following markers: CA-125 Results of the tumor marker test revealed 31.2   12/13/2017 Imaging   New 2.7 cm peritoneal soft tissue mass in anterior left lower quadrant, consistent with metastatic disease.  No other sites  of metastatic disease identified.  Incidental findings including: Cholelithiasis. Colonic diverticulosis. Tiny hiatal hernia. Aortic atherosclerosis.   01/09/2018 - 05/07/2018 Chemotherapy   The patient had carboplatin and taxol; After 2nd cycle, she developed carboplatin allergy. She received  carboplatin desensitization protocol at [facility] for final 4 cycles, completed by 6/17. Avastin was added from 04/16/18 onwards   01/30/2018 Adverse Reaction   Potential carboplatin allergy is suspected. She received half the dose of prescribed carboplatin on cycle 2   05/31/2018 Tumor Marker   Patient's tumor was tested for the following markers: CA-125 Results of the tumor marker test revealed 26   05/31/2018 Imaging   1. Peritoneal soft tissue nodule identified on the previous study has decreased in the interval the. No progressive findings in the abdomen or pelvis on today's study to suggest disease progression. 2. Tiny pulmonary nodules, likely benign. Attention on follow-up recommended. 3. Cholelithiasis. 4.  Aortic Atherosclerois (ICD10-170.0) 5. Tiny hiatal hernia.     06/04/2018 - 07/30/2019 Chemotherapy   The patient is placed on Avastin for maintenance   06/25/2018 Tumor Marker   Patient's tumor was tested for the following markers: CA-125 Results of the tumor marker test revealed 22.5   08/06/2018 Tumor Marker   Patient's tumor was tested for the following markers: CA-125 Results of the tumor marker test revealed 20.7   09/14/2018 Imaging   Status post hysterectomy and bilateral salpingo-oophorectomy.  Stable soft tissue nodule beneath the left lower anterior abdominal wall, likely reflecting stable peritoneal disease.  Scattered small subpleural nodules in the lungs bilaterally, measuring up to 3 mm, technically indeterminate although likely benign. Please note that Fleischner Society guidelines do not apply. Attention on follow-up is suggested.  No evidence of new/progressive metastatic disease.   09/14/2018 Tumor Marker   Patient's tumor was tested for the following markers: CA-125 Results of the tumor marker test revealed 21.3   10/30/2018 Tumor Marker   Patient's tumor was tested for the following markers: CA-125 Results of the tumor marker test revealed 20.8    12/12/2018 Tumor Marker   Patient's tumor was tested for the following markers: CA-125 Results of the tumor marker test revealed 19.7   01/01/2019 Tumor Marker   Patient's tumor was tested for the following markers: CA-125 Results of the tumor marker test revealed 21.6   01/22/2019 Tumor Marker   Patient's tumor was tested for the following markers: CA-125 Results of the tumor marker test revealed 21.3   02/12/2019 Tumor Marker   Patient's tumor was tested for the following markers: CA-125 Results of the tumor marker test revealed 21.6   04/16/2019 Tumor Marker   Patient's tumor was tested for the following markers: CA-125 Results of the tumor marker test revealed 21.7   05/07/2019 Tumor Marker   Patient's tumor was tested for the following markers: CA-125 Results of the tumor marker test revealed 27.3   05/28/2019 Tumor Marker   Patient's tumor was tested for the following markers: CA-125 Results of the tumor marker test revealed 24.5   07/09/2019 - 08/19/2019 Chemotherapy   The patient had bevacizumab for chemotherapy treatment.     07/09/2019 Tumor Marker   Patient's tumor was tested for the following markers: CA-125 Results of the tumor marker test revealed 23.3.   08/06/2019 Imaging   Ct abdomen and pelvis 1. New hypodense lesions of the left lobe of the liver measuring 2.5 x 2.5 cm (series 2, image 16) and right lobe of the liver measuring 1.1 x 1.1 cm (series 2, image  23), concerning for metastatic disease.   2. Interval enlargement of an irregular nodule of the omentum measuring 1.0 x 1.0 cm, previously 1.0 x 0.5 cm (series 2, image 51), concerning for peritoneal disease.   3.  Status post hysterectomy.   4. Other chronic and incidental findings as detailed above. Aortic Atherosclerosis (ICD10-I70.0).   08/16/2019 - 01/17/2020 Chemotherapy   The patient had weekly Taxol for chemotherapy treatment.     08/23/2019 Tumor Marker   Patient's tumor was tested for the following  markers: CA-125 Results of the tumor marker test revealed 33.7.   09/06/2019 Tumor Marker   Patient's tumor was tested for the following markers: CA-125 Results of the tumor marker test revealed 32.2   10/14/2019 Imaging   1. Interval decrease in hypodense masses of the left lobe and right lobe of the liver, mass in the left lobe measuring 1.7 x 1.6 cm, previously 2.5 x 2.5 cm (series 2, image 14) and in the right lobe of the liver measuring 0.8 x 0.7 cm, previously 1.1 x 1.1 cm (series 2, image 22).   2. Interval decrease in size of an omental nodule, measuring 0.8 x 0.7 cm, previously 1.0 x 1.0 cm (series 2, image 55).   3. Findings are consistent with improved metastatic disease. No new evidence of metastatic disease in the abdomen or pelvis.   4.  Status post hysterectomy.   5.  Cholelithiasis.   6.  Aortic Atherosclerosis (ICD10-I70.0).   01/23/2020 Imaging   1. 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 2. Slight increase in size of left lower quadrant omental soft tissue nodule, consistent with metastatic disease. 3. No new sites of metastatic disease identified. 4. Stable cholelithiasis and gallbladder wall thickening. No evidence of acute cholecystitis. 5. Colonic diverticulosis. No radiographic evidence of diverticulitis. 6. Tiny hiatal hernia.   01/28/2020 Echocardiogram    1. Left ventricular ejection fraction, by estimation, is 60 to 65%. The left ventricle has normal function. The left ventricle has no regional wall motion abnormalities. Left ventricular diastolic parameters are consistent with Grade I diastolic dysfunction (impaired relaxation). The average left ventricular global longitudinal strain is -19.7 % (normal), however this may be underestimated given suboptimal tracking.  2. Right ventricular systolic function is normal. The right ventricular size is normal.  3. The mitral valve is normal in structure. Mild mitral valve regurgitation. No evidence of  mitral stenosis.  4. The aortic valve is normal in structure. Aortic valve regurgitation is not visualized. No aortic stenosis is present.  5. The inferior vena cava is normal in size with greater than 50% respiratory variability, suggesting right atrial pressure of 3 mmHg.   Conclusion(s)/Recommendation(s): Normal biventricular function without evidence of hemodynamically significant valvular heart disease   01/31/2020 - 04/23/2020 Chemotherapy   The patient had DOXOrubicin for chemotherapy treatment.     04/16/2020 Imaging   1. Mild increase in size of metastasis involving the left hepatic lobe. 2. Stable small left lower quadrant omental soft tissue nodule, suspicious for peritoneal metastasis. 3. No new sites of metastatic disease identified. 4. Cholelithiasis. No radiographic evidence of cholecystitis. 5. Colonic diverticulosis. No radiographic evidence of diverticulitis. 6. Small hiatal hernia.   Aortic Atherosclerosis (ICD10-I70.0).   05/01/2020 -  Chemotherapy   The patient had gemcitabine for chemotherapy treatment.     07/16/2020 Imaging   1. 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 Slight enlargement of the larger metastatic lesion in the LEFT hepatic lobe. 2. Mild increase in  volume of small LEFT lower quadrant peritoneal nodule. 3. No ascites. 4. Chronic enhancement of the gallbladder fundus.  Cholelithiasis.     REVIEW OF SYSTEMS:   Constitutional: Denies fevers, chills or abnormal weight loss Eyes: Denies blurriness of vision Ears, nose, mouth, throat, and face: Denies mucositis or sore throat Respiratory: Denies cough, dyspnea or wheezes Cardiovascular: Denies palpitation, chest discomfort or lower extremity swelling Gastrointestinal:  Denies nausea, heartburn or change in bowel habits Skin: Denies abnormal skin rashes Lymphatics: Denies new lymphadenopathy or easy bruising Neurological:Denies numbness, tingling or new weaknesses Behavioral/Psych: Mood is  stable, no new changes  All other systems were reviewed with the patient and are negative.  I have reviewed the past medical history, past surgical history, social history and family history with the patient and they are unchanged from previous note.  ALLERGIES:  is allergic to carboplatin.  MEDICATIONS:  Current Outpatient Medications  Medication Sig Dispense Refill  . amLODipine (NORVASC) 10 MG tablet Take 1 tablet (10 mg total) by mouth daily. 90 tablet 3  . atenolol (TENORMIN) 25 MG tablet Take 1 tablet (25 mg total) by mouth daily. 30 tablet 9  . atorvastatin (LIPITOR) 10 MG tablet Take 1 tablet (10 mg total) by mouth daily. 30 tablet 2  . Coenzyme Q10 (CO Q 10) 100 MG CAPS Take 1 capsule by mouth daily.     . Glucosamine-Chondroit-Vit C-Mn (GLUCOSAMINE 1500 COMPLEX PO) Take 1 capsule by mouth 2 (two) times daily.    [name] lidocaine-prilocaine (EMLA) cream Apply to affected area once 30 g 3  . loratadine (CLARITIN) 10 MG tablet Take 10 mg by mouth daily as needed for allergies (hives).    . Multiple Vitamin (MULTIVITAMIN) capsule Take 1 capsule by mouth daily.     . Omega-3 Fatty Acids (OMEGA-3 FISH OIL) 300 MG CAPS Take 1 capsule by mouth daily.     . ondansetron (ZOFRAN) 8 MG tablet Take 1 tablet (8 mg total) by mouth every 8 (eight) hours as needed for refractory nausea / vomiting. 30 tablet 1  . Polyethyl Glycol-Propyl Glycol (SYSTANE ULTRA OP) Apply 1 drop to eye 2 (two) times daily at 10 AM and 5 PM.    . Probiotic Product (PROBIOTIC ADVANCED PO) Take 1 capsule by mouth daily.    . prochlorperazine (COMPAZINE) 10 MG tablet Take 1 tablet (10 mg total) by mouth every 6 (six) hours as needed (Nausea or vomiting). 60 tablet 1  . vitamin E 400 UNIT capsule Take 400 Units by mouth every evening.      No current facility-administered medications for this visit.    PHYSICAL EXAMINATION: ECOG PERFORMANCE STATUS: 1 - Symptomatic but completely ambulatory  Vitals:   08/14/20 1344  BP:  (!) 165/64  Pulse: 69  Resp: 18  Temp: 98 F (36.7 C)  SpO2: 100%   Filed Weights   08/14/20 1344  Weight: 137 lb 12.8 oz (62.5 kg)    GENERAL:alert, no distress and comfortable  NEURO: alert & oriented x 3 with fluent speech, no focal motor/sensory deficits  LABORATORY DATA:  I have reviewed the data as listed    Component Value Date/Time   NA 140 08/14/2020 1305   NA 142 11/28/2016 0916   K 4.0 08/14/2020 1305   K 3.9 11/28/2016 0916   CL 105 08/14/2020 1305   CO2 28 08/14/2020 1305   CO2 28 11/28/2016 0916   GLUCOSE 92 08/14/2020 1305   GLUCOSE 78 11/28/2016 0916   BUN 20 08/14/2020 1305  BUN 16.8 11/28/2016 0916   CREATININE 0.85 08/14/2020 1305   CREATININE 0.8 11/28/2016 0916   CALCIUM 9.7 08/14/2020 1305   CALCIUM 10.5 (H) 11/28/2016 0916   PROT 7.1 08/14/2020 1305   PROT 7.3 11/28/2016 0916   ALBUMIN 3.9 08/14/2020 1305   ALBUMIN 4.3 11/28/2016 0916   AST 35 08/14/2020 1305   AST 19 11/28/2016 0916   ALT 19 08/14/2020 1305   ALT 19 11/28/2016 0916   ALKPHOS 68 08/14/2020 1305   ALKPHOS 50 11/28/2016 0916   BILITOT 0.4 08/14/2020 1305   BILITOT 0.48 11/28/2016 0916   GFRNONAA >60 08/14/2020 1305   GFRAA >60 08/14/2020 1305    No results found for: SPEP, UPEP  Lab Results  Component Value Date   WBC 4.9 08/14/2020   NEUTROABS 2.7 08/14/2020   HGB 11.7 (L) 08/14/2020   HCT 34.8 (L) 08/14/2020   MCV 95.1 08/14/2020   PLT 124 (L) 08/14/2020      Chemistry      Component Value Date/Time   NA 140 08/14/2020 1305   NA 142 11/28/2016 0916   K 4.0 08/14/2020 1305   K 3.9 11/28/2016 0916   CL 105 08/14/2020 1305   CO2 28 08/14/2020 1305   CO2 28 11/28/2016 0916   BUN 20 08/14/2020 1305   BUN 16.8 11/28/2016 0916   CREATININE 0.85 08/14/2020 1305   CREATININE 0.8 11/28/2016 0916      Component Value Date/Time   CALCIUM 9.7 08/14/2020 1305   CALCIUM 10.5 (H) 11/28/2016 0916   ALKPHOS 68 08/14/2020 1305   ALKPHOS 50 11/28/2016 0916   AST  35 08/14/2020 1305   AST 19 11/28/2016 0916   ALT 19 08/14/2020 1305   ALT 19 11/28/2016 0916   BILITOT 0.4 08/14/2020 1305   BILITOT 0.48 11/28/2016 0916

## 2020-08-14 NOTE — Assessment & Plan Note (Signed)
We have extensive discussions about treatment options and plan of care I addressed multiple questions from the patient and her husband He is wondering whether immunotherapy would be an appropriate treatment option for her He is also wondering about the benefit of clinical trials We have performed MSI testing in the past which came back MSI stable To get immunotherapy approved, we will have to repeat biopsy for PD-L1 testing or to order additional molecular testing such as Foundation One In my experience, the likelihood of this disease being PD-L1 positive is low We discussed various phases of clinical trials, what it means to be enrolled in phase 1, phase 2 of phase 3 clinical trials For now, we do not have any clinical trials available for her type of condition Ultimately, she is satisfied with explanation We also reviewed other treatment that is documented and available at the NCCN guidelines She is not interested to pursue those treatment options for now

## 2020-08-19 ENCOUNTER — Encounter: Payer: Self-pay | Admitting: Hematology and Oncology

## 2020-11-05 ENCOUNTER — Telehealth: Payer: Self-pay | Admitting: Hematology and Oncology

## 2020-11-05 ENCOUNTER — Encounter: Payer: Self-pay | Admitting: Hematology and Oncology

## 2020-11-05 NOTE — Telephone Encounter (Signed)
Scheduled appt per 12/16 sch msg - pt is aware of appt date and time   

## 2020-11-23 IMAGING — CT CT ABD-PELV W/ CM
2 of 5 series · 16 of 46 positions shown, 18 images · IV contrast (OMNIPAQUE)
Comparison: 09/14/2018

CLINICAL DATA: Recurrent ovarian cancer, assess treatment response

EXAM:
CT ABDOMEN AND PELVIS WITH CONTRAST
TECHNIQUE: Multidetector CT imaging of the abdomen and pelvis was performed
using the standard protocol following bolus administration of
intravenous contrast.
CONTRAST:  100mL OMNIPAQUE IOHEXOL 300 MG/ML SOLN, additional oral
enteric contrast

[Series 2: axial st · axial · 0.72mm/px · z∈[-593,-238]mm · 13 of 83 slices shown, 15 images]
[im 6/83  soft-tissue]
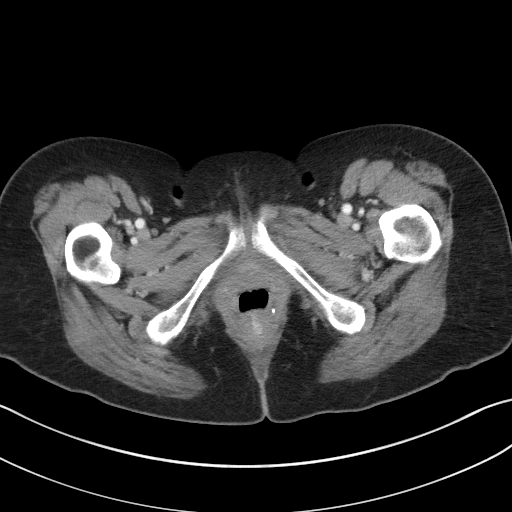
[im 6/83  bone]
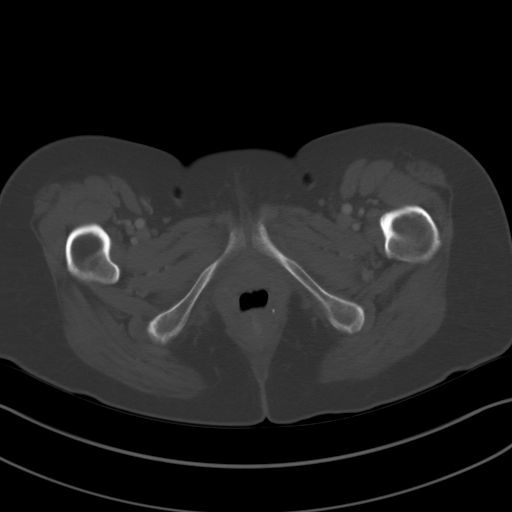
[im 11/83  soft-tissue]
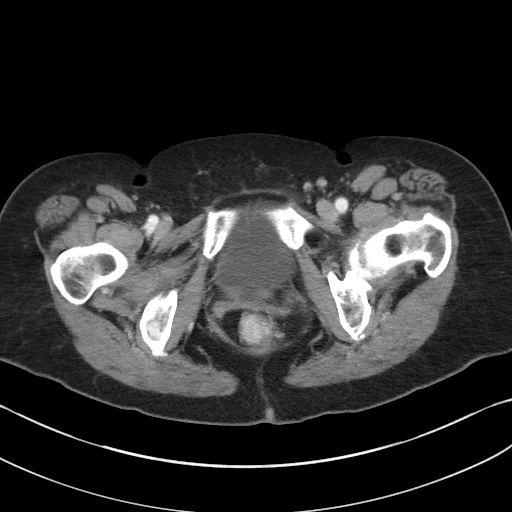
[im 17/83  soft-tissue]
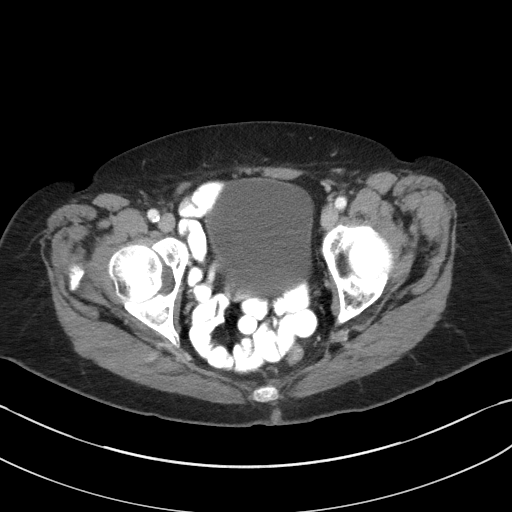
[im 22/83  soft-tissue]
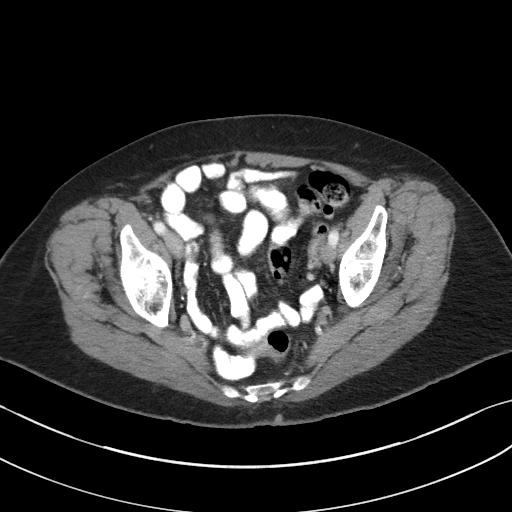
[im 28/83  soft-tissue]
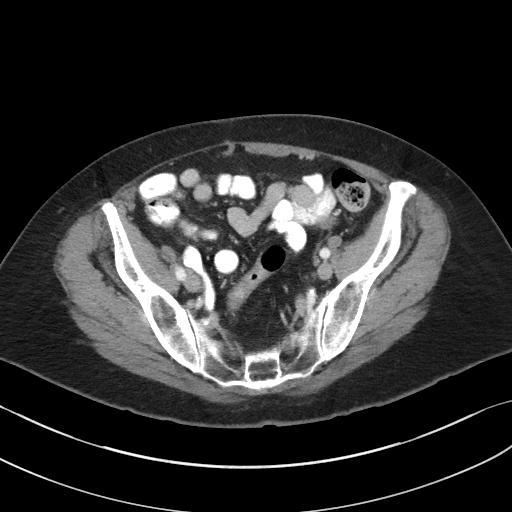
[im 33/83  soft-tissue]
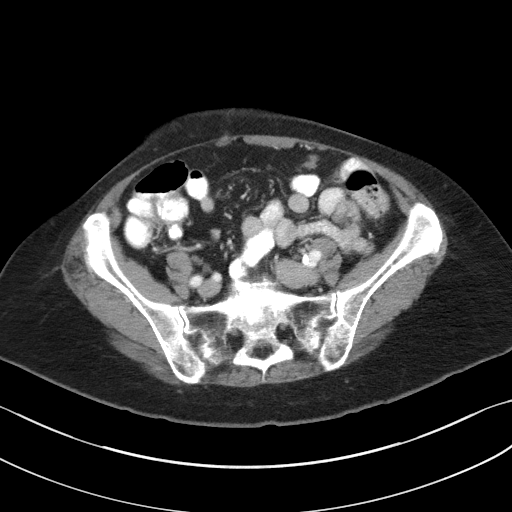
[im 44/83  soft-tissue]
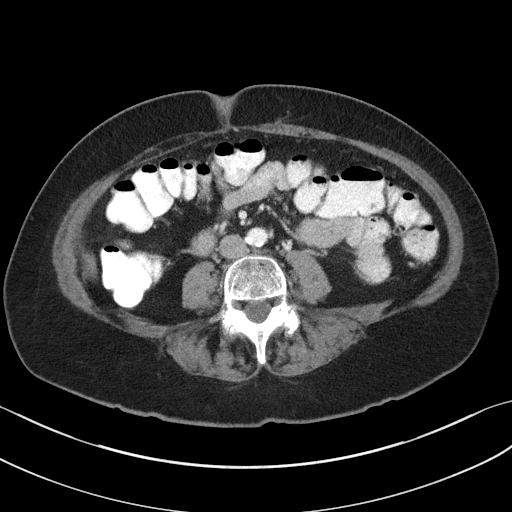
[im 50/83  soft-tissue]
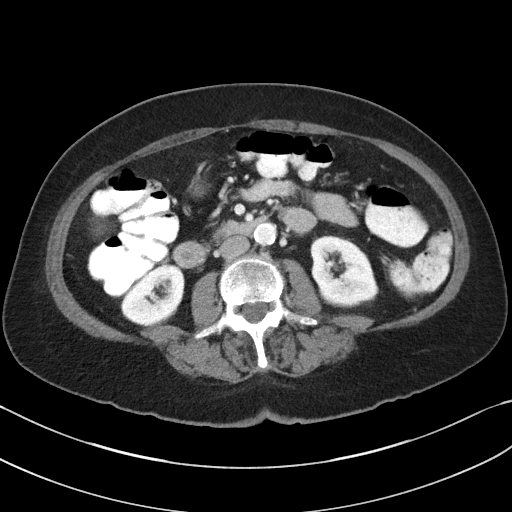
[im 55/83  soft-tissue]
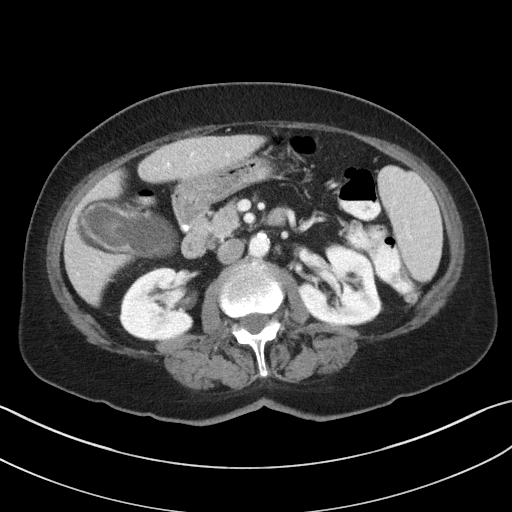
[im 55/83  bone]
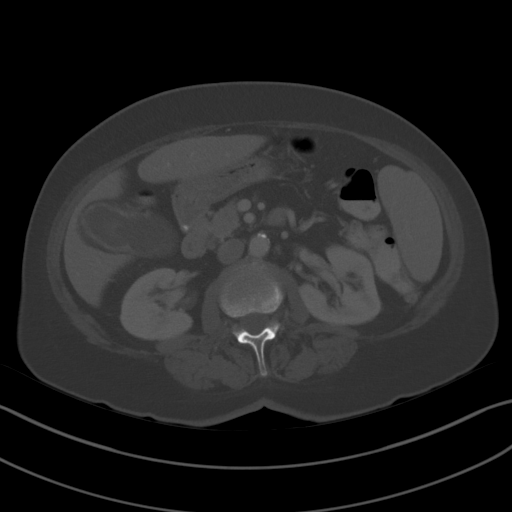
[im 61/83  soft-tissue]
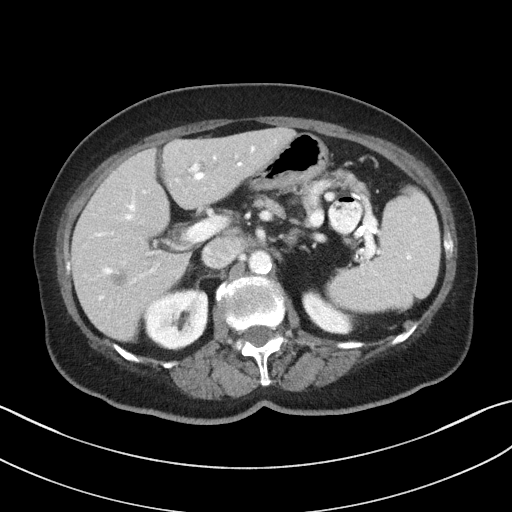
[im 66/83  soft-tissue]
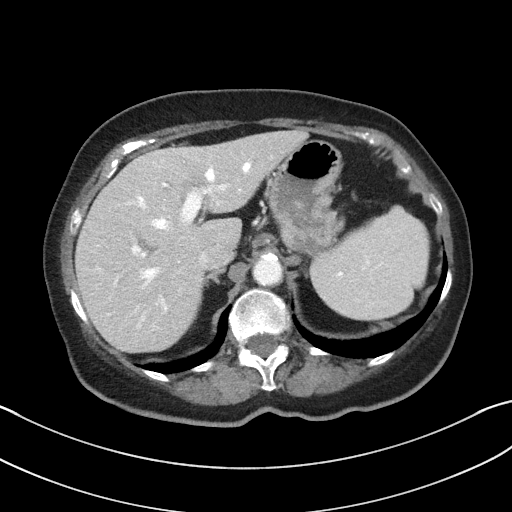
[im 72/83  soft-tissue]
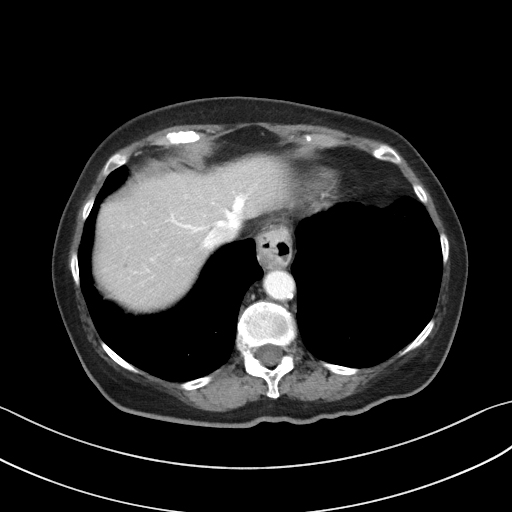
[im 77/83  soft-tissue]
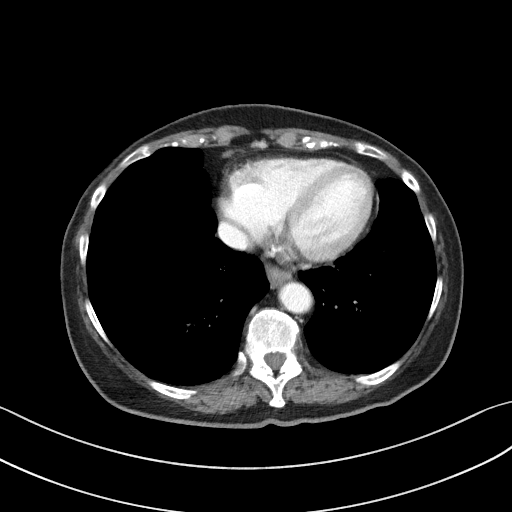

[Series 4: coronal st · coronal · 0.74mm/px · 3 of 77 slices shown]
[im 26/77  soft-tissue]
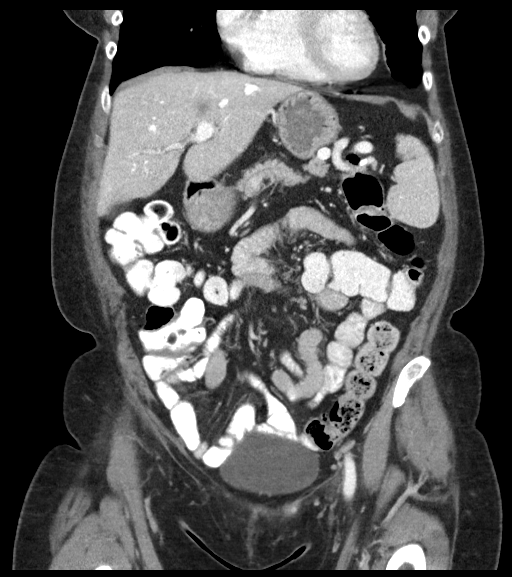
[im 34/77  soft-tissue]
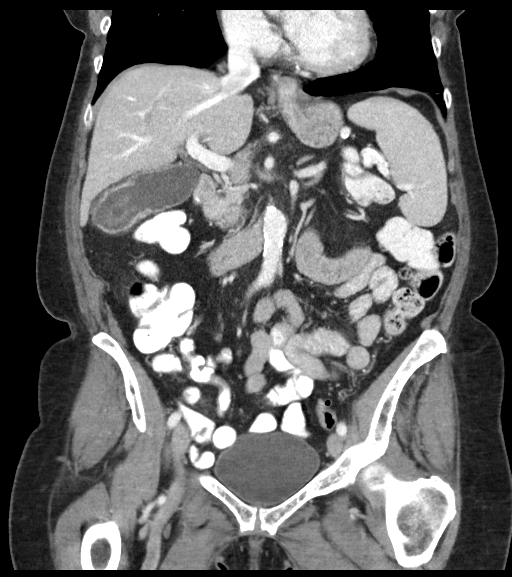
[im 43/77  soft-tissue]
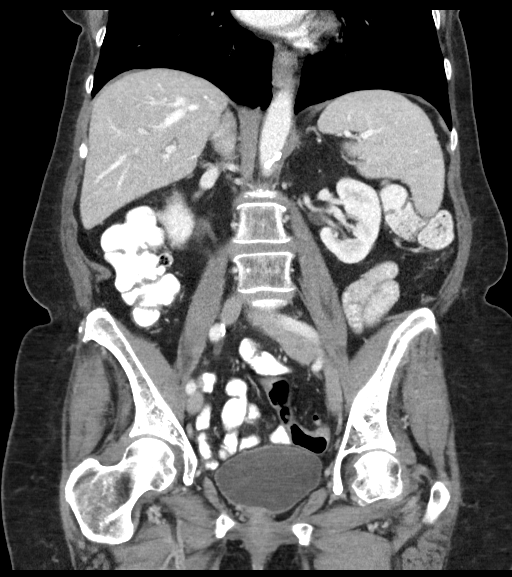

[16 of 46 positions shown; findings below may reference images not displayed]

FINDINGS: Lower chest: No acute abnormality.  Small hiatal hernia.

Hepatobiliary: New hypodense lesions of the left lobe of the liver
measuring 2.5 x 2.5 cm (series 2, image 16) and right lobe of the
liver measuring 1.1 x 1.1 cm (series 2, image 23). Large rim
calcified gallstones. No gallbladder wall thickening, or biliary
dilatation.

Pancreas: Unremarkable. No pancreatic ductal dilatation or
surrounding inflammatory changes.

Spleen: Normal in size without significant abnormality.

Adrenals/Urinary Tract: Adrenal glands are unremarkable. Kidneys are
normal, without renal calculi, solid lesion, or hydronephrosis.
Bladder is unremarkable.

Stomach/Bowel: Stomach is within normal limits. Appendix appears
normal. No evidence of bowel wall thickening, distention, or
inflammatory changes. Colonic diverticulosis.

Vascular/Lymphatic: Aortic atherosclerosis. No enlarged abdominal or
pelvic lymph nodes.

Reproductive: Status post hysterectomy.

Other: No abdominal wall hernia or abnormality. No abdominopelvic
ascites. Interval enlargement of an irregular nodule of the omentum
measuring 1.0 x 1.0 cm, previously 1.0 x 0.5 cm (series 2, image
51).

Musculoskeletal: No acute or significant osseous findings.
IMPRESSION: 1. New hypodense lesions of the left lobe of the liver measuring
x 2.5 cm (series 2, image 16) and right lobe of the liver measuring
1.1 x 1.1 cm (series 2, image 23), concerning for metastatic
disease.

2. Interval enlargement of an irregular nodule of the omentum
measuring 1.0 x 1.0 cm, previously 1.0 x 0.5 cm (series 2, image
51), concerning for peritoneal disease.

3.  Status post hysterectomy.

4. Other chronic and incidental findings as detailed above. Aortic
Atherosclerosis ([id number]).

## 2020-12-02 DIAGNOSIS — C561 Malignant neoplasm of right ovary: Secondary | ICD-10-CM | POA: Diagnosis not present

## 2020-12-02 DIAGNOSIS — I10 Essential (primary) hypertension: Secondary | ICD-10-CM | POA: Diagnosis not present

## 2020-12-02 DIAGNOSIS — Z0000 Encounter for general adult medical examination without abnormal findings: Secondary | ICD-10-CM | POA: Diagnosis not present

## 2020-12-02 DIAGNOSIS — Z7189 Other specified counseling: Secondary | ICD-10-CM | POA: Diagnosis not present

## 2020-12-09 ENCOUNTER — Inpatient Hospital Stay: Payer: Medicare PPO

## 2020-12-09 ENCOUNTER — Encounter: Payer: Self-pay | Admitting: Hematology and Oncology

## 2020-12-09 ENCOUNTER — Inpatient Hospital Stay: Payer: Medicare PPO | Attending: Hematology and Oncology | Admitting: Hematology and Oncology

## 2020-12-09 ENCOUNTER — Other Ambulatory Visit: Payer: Self-pay

## 2020-12-09 ENCOUNTER — Telehealth: Payer: Self-pay | Admitting: Oncology

## 2020-12-09 VITALS — BP 156/77 | HR 59 | Temp 97.0°F | Resp 18 | Ht 62.2 in | Wt 139.4 lb

## 2020-12-09 DIAGNOSIS — C561 Malignant neoplasm of right ovary: Secondary | ICD-10-CM

## 2020-12-09 DIAGNOSIS — Z7189 Other specified counseling: Secondary | ICD-10-CM

## 2020-12-09 DIAGNOSIS — C787 Secondary malignant neoplasm of liver and intrahepatic bile duct: Secondary | ICD-10-CM

## 2020-12-09 LAB — CMP (CANCER CENTER ONLY)
ALT: 30 U/L (ref 0–44)
AST: 70 U/L — ABNORMAL HIGH (ref 15–41)
Albumin: 3.8 g/dL (ref 3.5–5.0)
Alkaline Phosphatase: 182 U/L — ABNORMAL HIGH (ref 38–126)
Anion gap: 9 (ref 5–15)
BUN: 21 mg/dL (ref 8–23)
CO2: 24 mmol/L (ref 22–32)
Calcium: 9.5 mg/dL (ref 8.9–10.3)
Chloride: 105 mmol/L (ref 98–111)
Creatinine: 0.82 mg/dL (ref 0.44–1.00)
GFR, Estimated: >60 mL/min (ref >60)
Glucose, Bld: 94 mg/dL (ref 70–99)
Potassium: 4.2 mmol/L (ref 3.5–5.1)
Sodium: 138 mmol/L (ref 135–145)
Total Bilirubin: 0.5 mg/dL (ref 0.3–1.2)
Total Protein: 7.2 g/dL (ref 6.5–8.1)

## 2020-12-09 LAB — CBC WITH DIFFERENTIAL (CANCER CENTER ONLY)
Abs Immature Granulocytes: 0.01 10*3/uL (ref 0.00–0.07)
Basophils Absolute: 0.1 10*3/uL (ref 0.0–0.1)
Basophils Relative: 1 %
Eosinophils Absolute: 0.2 10*3/uL (ref 0.0–0.5)
Eosinophils Relative: 3 %
HCT: 36.9 % (ref 36.0–46.0)
Hemoglobin: 12.1 g/dL (ref 12.0–15.0)
Immature Granulocytes: 0 %
Lymphocytes Relative: 22 %
Lymphs Abs: 1 10*3/uL (ref 0.7–4.0)
MCH: 29.8 pg (ref 26.0–34.0)
MCHC: 32.8 g/dL (ref 30.0–36.0)
MCV: 90.9 fL (ref 80.0–100.0)
Monocytes Absolute: 0.6 10*3/uL (ref 0.1–1.0)
Monocytes Relative: 13 %
Neutro Abs: 2.9 10*3/uL (ref 1.7–7.7)
Neutrophils Relative %: 61 %
Platelet Count: 148 10*3/uL — ABNORMAL LOW (ref 150–400)
RBC: 4.06 MIL/uL (ref 3.87–5.11)
RDW: 13.2 % (ref 11.5–15.5)
WBC Count: 4.7 10*3/uL (ref 4.0–10.5)
nRBC: 0 % (ref 0.0–0.2)

## 2020-12-09 MED ORDER — LETROZOLE 2.5 MG PO TABS: 2.5000 mg | ORAL_TABLET | Freq: Every day | ORAL | 11 refills | Status: DC

## 2020-12-09 NOTE — Assessment & Plan Note (Signed)
She has abnormal liver enzymes She is not symptomatic Observe closely for now

## 2020-12-09 NOTE — Telephone Encounter (Signed)
Emily Hull with appointment to see Dr. Alvy Bimler on 12/16/20 at 1:00.  She verbalized understanding and agreement.

## 2020-12-09 NOTE — Progress Notes (Signed)
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[facility]) The patient felt better since discontinuation of chemotherapy She was discharged from hospice We discussed the risk and benefits of pursuing treatment Previously, her her tumor tested 80% ER positive We discussed the risk, benefits, side effects of palliative care treatment with tamoxifen or aromatase inhibitor Ultimately, we are in agreement to proceed with aromatase inhibitor I will order repeat CT imaging to serve as baseline I will see her back next week for further follow-up  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 CT CHEST ABDOMEN PELVIS W CONTRAST    Standing Status:   Future    Standing Expiration Date:   12/09/2021    Order Specific Question:   Preferred imaging location?    Answer:   [facility]    Order Specific Question:   Radiology Contrast Protocol - do NOT remove file path    Answer:   [email or web address]    All questions were answered. The patient knows to call the clinic with any problems, questions or concerns. The total time spent in the appointment was 40 minutes encounter with patients including review of chart and various tests results, discussions about plan of care and coordination of care plan   Heath Lark, MD 12/09/2020 2:57 PM  INTERVAL HISTORY: Please see below for problem oriented charting. She returns for further follow-up She was discharged from hospice She felt better since discontinuation of chemotherapy She is interested to pursue further  treatment and wants to know option She denies abdominal pain, nausea or changes in bowel habits  SUMMARY OF ONCOLOGIC HISTORY: Oncology History Overview Note  Neg genetics. Additional tests revealed ER: 80%, PR 3% MSI stable Progressed on Avastin, Doxil, Taxol and Gemzar Allergic to carboplatin   Right ovarian epithelial cancer ([place])  07/01/2015 Initial Diagnosis   She presented with postmenopausal bleeding   07/02/2015 Imaging   US pelvis showed bilateral ovarian cyst   07/13/2015 Imaging   5.2 cm left adnexal cystic lesion, with indeterminate but probably benign characteristics. In a postmenopausal female, consider continued annual imaging followup with CT or MRI versus surgical evaluation.  Colonic diverticulosis. No radiographic evidence of diverticulitis.  Cholelithiasis.  No radiographic evidence of cholecystitis.  Small hiatal hernia.   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  The tumor involves serosal surface of the right ovary and adjacent right fallopian tube.  Cervix and endometrium were within normal limits. ER positive Peritoneal washing was positive.   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   08/13/2015 Tumor Marker   Patient's tumor was tested for the following markers: CA-125 Results of the tumor marker test revealed 96   08/25/2015 - 12/08/2015 Chemotherapy   The patient had 6 cycles of carboplatin and taxol   09/07/2015 Tumor Marker   Patient's tumor was tested for the following markers: CA-125 Results of the tumor marker  test revealed 51   10/05/2015 Tumor Marker   Patient's tumor was tested for the following markers: CA-125 Results of the  tumor marker test revealed 29   11/09/2015 Tumor Marker   Patient's tumor was tested for the following markers: CA-125 Results of the tumor marker test revealed 44   12/08/2015 Tumor Marker   Patient's tumor was tested for the following markers: CA-125 Results of the tumor marker test revealed 30   12/15/2015 Genetic Testing   Genetics testing normal by [facility] Breast Ovarian panel 12-15-15   01/11/2016 Imaging   1. The only finding of note are several adjacent peripheral abnormal hypodensities along the anterior superior spleen margin, differential diagnostic considerations including small peripheral interval splenic infarcts, splenic injury with subcapsular a fluid collections, or less likely metastatic disease to the splenic margin. This likely merits observation. 2. Other imaging findings of potential clinical significance: Small type 1 hiatal hernia. Aortoiliac atherosclerotic vascular disease. Lower lumbar spondylosis and degenerative disc disease. Gallstones with potential mild gallbladder wall thickening.   03/07/2016 Tumor Marker   Patient's tumor was tested for the following markers: CA-125 Results of the tumor marker test revealed 24.2   04/06/2016 Imaging   1. No evidence of a ovarian cancer metastasis. 2. No ascites. 3. Post hysterectomy and oophorectomy. 4. Cholelithiasis with several large gallstones   04/06/2016 Tumor Marker   Patient's tumor was tested for the following markers: CA-125 Results of the tumor marker test revealed 22.8   05/30/2016 Tumor Marker   Patient's tumor was tested for the following markers: CA-125 Results of the tumor marker test revealed 21   09/07/2016 Tumor Marker   Patient's tumor was tested for the following markers: CA-125 Results of the tumor marker test revealed 22.4   11/28/2016 Tumor Marker   Patient's tumor was tested for the following markers: CA-125 Results of the tumor marker test revealed 18.8   02/23/2017 Tumor Marker   Patient's  tumor was tested for the following markers: CA-125 Results of the tumor marker test revealed 19.1   06/02/2017 Tumor Marker   Patient's tumor was tested for the following markers: CA-125 Results of the tumor marker test revealed 18.3   08/24/2017 Mammogram   Pt reports mammogram complete   08/28/2017 Tumor Marker   Patient's tumor was tested for the following markers: CA-125 Results of the tumor marker test revealed 21.1   12/01/2017 Tumor Marker   Patient's tumor was tested for the following markers: CA-125 Results of the tumor marker test revealed 31.2   12/13/2017 Imaging   New 2.7 cm peritoneal soft tissue mass in anterior left lower quadrant, consistent with metastatic disease.  No other sites of metastatic disease identified.  Incidental findings including: Cholelithiasis. Colonic diverticulosis. Tiny hiatal hernia. Aortic atherosclerosis.   01/09/2018 - 05/07/2018 Chemotherapy   The patient had carboplatin and taxol; After 2nd cycle, she developed carboplatin allergy. She received carboplatin desensitization protocol at [facility] for final 4 cycles, completed by 6/17. Avastin was added from 04/16/18 onwards   01/30/2018 Adverse Reaction   Potential carboplatin allergy is suspected. She received half the dose of prescribed carboplatin on cycle 2   05/31/2018 Tumor Marker   Patient's tumor was tested for the following markers: CA-125 Results of the tumor marker test revealed 26   05/31/2018 Imaging   1. Peritoneal soft tissue nodule identified on the previous study has decreased in the interval the. No progressive findings in the abdomen or pelvis on today's study to suggest disease progression. 2. Tiny  pulmonary nodules, likely benign. Attention on follow-up recommended. 3. Cholelithiasis. 4.  Aortic Atherosclerois (ICD10-170.0) 5. Tiny hiatal hernia.     06/04/2018 - 07/30/2019 Chemotherapy   The patient is placed on Avastin for maintenance   06/25/2018 Tumor Marker   Patient's  tumor was tested for the following markers: CA-125 Results of the tumor marker test revealed 22.5   08/06/2018 Tumor Marker   Patient's tumor was tested for the following markers: CA-125 Results of the tumor marker test revealed 20.7   09/14/2018 Imaging   Status post hysterectomy and bilateral salpingo-oophorectomy.  Stable soft tissue nodule beneath the left lower anterior abdominal wall, likely reflecting stable peritoneal disease.  Scattered small subpleural nodules in the lungs bilaterally, measuring up to 3 mm, technically indeterminate although likely benign. Please note that Fleischner Society guidelines do not apply. Attention on follow-up is suggested.  No evidence of new/progressive metastatic disease.   09/14/2018 Tumor Marker   Patient's tumor was tested for the following markers: CA-125 Results of the tumor marker test revealed 21.3   10/30/2018 Tumor Marker   Patient's tumor was tested for the following markers: CA-125 Results of the tumor marker test revealed 20.8   12/12/2018 Tumor Marker   Patient's tumor was tested for the following markers: CA-125 Results of the tumor marker test revealed 19.7   01/01/2019 Tumor Marker   Patient's tumor was tested for the following markers: CA-125 Results of the tumor marker test revealed 21.6   01/22/2019 Tumor Marker   Patient's tumor was tested for the following markers: CA-125 Results of the tumor marker test revealed 21.3   02/12/2019 Tumor Marker   Patient's tumor was tested for the following markers: CA-125 Results of the tumor marker test revealed 21.6   04/16/2019 Tumor Marker   Patient's tumor was tested for the following markers: CA-125 Results of the tumor marker test revealed 21.7   05/07/2019 Tumor Marker   Patient's tumor was tested for the following markers: CA-125 Results of the tumor marker test revealed 27.3   05/28/2019 Tumor Marker   Patient's tumor was tested for the following markers: CA-125 Results  of the tumor marker test revealed 24.5   07/09/2019 - 08/19/2019 Chemotherapy   The patient had bevacizumab for chemotherapy treatment.     07/09/2019 Tumor Marker   Patient's tumor was tested for the following markers: CA-125 Results of the tumor marker test revealed 23.3.   08/06/2019 Imaging   Ct abdomen and pelvis 1. New hypodense lesions of the left lobe of the liver measuring 2.5 x 2.5 cm (series 2, image 16) and right lobe of the liver measuring 1.1 x 1.1 cm (series 2, image 23), concerning for metastatic disease.   2. Interval enlargement of an irregular nodule of the omentum measuring 1.0 x 1.0 cm, previously 1.0 x 0.5 cm (series 2, image 51), concerning for peritoneal disease.   3.  Status post hysterectomy.   4. Other chronic and incidental findings as detailed above. Aortic Atherosclerosis (ICD10-I70.0).   08/16/2019 - 01/17/2020 Chemotherapy   The patient had weekly Taxol for chemotherapy treatment.     08/23/2019 Tumor Marker   Patient's tumor was tested for the following markers: CA-125 Results of the tumor marker test revealed 33.7.   09/06/2019 Tumor Marker   Patient's tumor was tested for the following markers: CA-125 Results of the tumor marker test revealed 32.2   10/14/2019 Imaging   1. Interval decrease in hypodense masses of the left lobe and right lobe of the  liver, mass in the left lobe measuring 1.7 x 1.6 cm, previously 2.5 x 2.5 cm (series 2, image 14) and in the right lobe of the liver measuring 0.8 x 0.7 cm, previously 1.1 x 1.1 cm (series 2, image 22).   2. Interval decrease in size of an omental nodule, measuring 0.8 x 0.7 cm, previously 1.0 x 1.0 cm (series 2, image 55).   3. Findings are consistent with improved metastatic disease. No new evidence of metastatic disease in the abdomen or pelvis.   4.  Status post hysterectomy.   5.  Cholelithiasis.   6.  Aortic Atherosclerosis (ICD10-I70.0).   01/23/2020 Imaging   1. Mild increase in size of left  hepatic lobe metastasis. 2. Slight increase in size of left lower quadrant omental soft tissue nodule, consistent with metastatic disease. 3. No new sites of metastatic disease identified. 4. Stable cholelithiasis and gallbladder wall thickening. No evidence of acute cholecystitis. 5. Colonic diverticulosis. No radiographic evidence of diverticulitis. 6. Tiny hiatal hernia.   01/28/2020 Echocardiogram    1. Left ventricular ejection fraction, by estimation, is 60 to 65%. The left ventricle has normal function. The left ventricle has no regional wall motion abnormalities. Left ventricular diastolic parameters are consistent with Grade I diastolic dysfunction (impaired relaxation). The average left ventricular global longitudinal strain is -19.7 % (normal), however this may be underestimated given suboptimal tracking.  2. Right ventricular systolic function is normal. The right ventricular size is normal.  3. The mitral valve is normal in structure. Mild mitral valve regurgitation. No evidence of mitral stenosis.  4. The aortic valve is normal in structure. Aortic valve regurgitation is not visualized. No aortic stenosis is present.  5. The inferior vena cava is normal in size with greater than 50% respiratory variability, suggesting right atrial pressure of 3 mmHg.   Conclusion(s)/Recommendation(s): Normal biventricular function without evidence of hemodynamically significant valvular heart disease   01/31/2020 - 04/23/2020 Chemotherapy   The patient had DOXOrubicin for chemotherapy treatment.     04/16/2020 Imaging   1. Mild increase in size of metastasis involving the left hepatic lobe. 2. Stable small left lower quadrant omental soft tissue nodule, suspicious for peritoneal metastasis. 3. No new sites of metastatic disease identified. 4. Cholelithiasis. No radiographic evidence of cholecystitis. 5. Colonic diverticulosis. No radiographic evidence of diverticulitis. 6. Small hiatal hernia.    Aortic Atherosclerosis (ICD10-I70.0).   05/01/2020 -  Chemotherapy   The patient had gemcitabine for chemotherapy treatment.     07/16/2020 Imaging   1. 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 Slight enlargement of the larger metastatic lesion in the LEFT hepatic lobe. 2. Mild increase in volume of small LEFT lower quadrant peritoneal nodule. 3. No ascites. 4. Chronic enhancement of the gallbladder fundus.  Cholelithiasis.     REVIEW OF SYSTEMS:   Constitutional: Denies fevers, chills or abnormal weight loss Eyes: Denies blurriness of vision Ears, nose, mouth, throat, and face: Denies mucositis or sore throat Respiratory: Denies cough, dyspnea or wheezes Cardiovascular: Denies palpitation, chest discomfort or lower extremity swelling Gastrointestinal:  Denies nausea, heartburn or change in bowel habits Skin: Denies abnormal skin rashes Lymphatics: Denies new lymphadenopathy or easy bruising Neurological:Denies numbness, tingling or new weaknesses Behavioral/Psych: Mood is stable, no new changes  All other systems were reviewed with the patient and are negative.  I have reviewed the past medical history, past surgical history, social history and family history with the patient and they are unchanged from previous note.  ALLERGIES:  is allergic to carboplatin.  MEDICATIONS:  Current Outpatient Medications  Medication Sig Dispense Refill  . letrozole (FEMARA) 2.5 MG tablet Take 1 tablet (2.5 mg total) by mouth daily. 30 tablet 11  . amLODipine (NORVASC) 10 MG tablet Take 1 tablet (10 mg total) by mouth daily. 90 tablet 3  . atenolol (TENORMIN) 25 MG tablet Take 1 tablet (25 mg total) by mouth daily. 30 tablet 9  . Coenzyme Q10 (CO Q 10) 100 MG CAPS Take 1 capsule by mouth daily.     . Glucosamine-Chondroit-Vit C-Mn (GLUCOSAMINE 1500 COMPLEX PO) Take 1 capsule by mouth 2 (two) times daily.    [name] lidocaine-prilocaine (EMLA) cream Apply to affected area once 30 g 3  .  loratadine (CLARITIN) 10 MG tablet Take 10 mg by mouth daily as needed for allergies (hives).    . Multiple Vitamin (MULTIVITAMIN) capsule Take 1 capsule by mouth daily.     . Omega-3 Fatty Acids (OMEGA-3 FISH OIL) 300 MG CAPS Take 1 capsule by mouth daily.     . ondansetron (ZOFRAN) 8 MG tablet Take 1 tablet (8 mg total) by mouth every 8 (eight) hours as needed for refractory nausea / vomiting. 30 tablet 1  . Polyethyl Glycol-Propyl Glycol (SYSTANE ULTRA OP) Apply 1 drop to eye 2 (two) times daily at 10 AM and 5 PM.    . Probiotic Product (PROBIOTIC ADVANCED PO) Take 1 capsule by mouth daily.    . prochlorperazine (COMPAZINE) 10 MG tablet Take 1 tablet (10 mg total) by mouth every 6 (six) hours as needed (Nausea or vomiting). 60 tablet 1  . vitamin E 400 UNIT capsule Take 400 Units by mouth every evening.      No current facility-administered medications for this visit.    PHYSICAL EXAMINATION: ECOG PERFORMANCE STATUS: 1 - Symptomatic but completely ambulatory  Vitals:   12/09/20 1127  BP: (!) 156/77  Pulse: (!) 59  Resp: 18  Temp: (!) 97 F (36.1 C)  SpO2: 100%   Filed Weights   12/09/20 1127  Weight: 139 lb 6.4 oz (63.2 kg)    GENERAL:alert, no distress and comfortable NEURO: alert & oriented x 3 with fluent speech, no focal motor/sensory deficits  LABORATORY DATA:  I have reviewed the data as listed    Component Value Date/Time   NA 138 12/09/2020 1117   NA 142 11/28/2016 0916   K 4.2 12/09/2020 1117   K 3.9 11/28/2016 0916   CL 105 12/09/2020 1117   CO2 24 12/09/2020 1117   CO2 28 11/28/2016 0916   GLUCOSE 94 12/09/2020 1117   GLUCOSE 78 11/28/2016 0916   BUN 21 12/09/2020 1117   BUN 16.8 11/28/2016 0916   CREATININE 0.82 12/09/2020 1117   CREATININE 0.8 11/28/2016 0916   CALCIUM 9.5 12/09/2020 1117   CALCIUM 10.5 (H) 11/28/2016 0916   PROT 7.2 12/09/2020 1117   PROT 7.3 11/28/2016 0916   ALBUMIN 3.8 12/09/2020 1117   ALBUMIN 4.3 11/28/2016 0916   AST 70  (H) 12/09/2020 1117   AST 19 11/28/2016 0916   ALT 30 12/09/2020 1117   ALT 19 11/28/2016 0916   ALKPHOS 182 (H) 12/09/2020 1117   ALKPHOS 50 11/28/2016 0916   BILITOT 0.5 12/09/2020 1117   BILITOT 0.48 11/28/2016 0916   GFRNONAA >60 12/09/2020 1117   GFRAA >60 08/14/2020 1305    No results found for: SPEP, UPEP  Lab Results  Component Value Date   WBC 4.7 12/09/2020   NEUTROABS 2.9 12/09/2020  HGB 12.1 12/09/2020   HCT 36.9 12/09/2020   MCV 90.9 12/09/2020   PLT 148 (L) 12/09/2020      Chemistry      Component Value Date/Time   NA 138 12/09/2020 1117   NA 142 11/28/2016 0916   K 4.2 12/09/2020 1117   K 3.9 11/28/2016 0916   CL 105 12/09/2020 1117   CO2 24 12/09/2020 1117   CO2 28 11/28/2016 0916   BUN 21 12/09/2020 1117   BUN 16.8 11/28/2016 0916   CREATININE 0.82 12/09/2020 1117   CREATININE 0.8 11/28/2016 0916      Component Value Date/Time   CALCIUM 9.5 12/09/2020 1117   CALCIUM 10.5 (H) 11/28/2016 0916   ALKPHOS 182 (H) 12/09/2020 1117   ALKPHOS 50 11/28/2016 0916   AST 70 (H) 12/09/2020 1117   AST 19 11/28/2016 0916   ALT 30 12/09/2020 1117   ALT 19 11/28/2016 0916   BILITOT 0.5 12/09/2020 1117   BILITOT 0.48 11/28/2016 0916

## 2020-12-09 NOTE — Assessment & Plan Note (Signed)
The patient felt better since discontinuation of chemotherapy She was discharged from hospice We discussed the risk and benefits of pursuing treatment Previously, her her tumor tested 80% ER positive We discussed the risk, benefits, side effects of palliative care treatment with tamoxifen or aromatase inhibitor Ultimately, we are in agreement to proceed with aromatase inhibitor I will order repeat CT imaging to serve as baseline I will see her back next week for further follow-up

## 2020-12-09 NOTE — Assessment & Plan Note (Signed)
We had extensive discussions about goals of care Ultimately, she is in agreement to proceed with palliative antiestrogen therapy

## 2020-12-10 ENCOUNTER — Encounter: Payer: Self-pay | Admitting: Hematology and Oncology

## 2020-12-11 ENCOUNTER — Ambulatory Visit (HOSPITAL_COMMUNITY): Payer: Medicare PPO

## 2020-12-11 ENCOUNTER — Encounter: Payer: Self-pay | Admitting: Hematology and Oncology

## 2020-12-16 ENCOUNTER — Ambulatory Visit (HOSPITAL_COMMUNITY)
Admission: RE | Admit: 2020-12-16 | Discharge: 2020-12-16 | Disposition: A | Discharge status: Home / Self Care | Payer: Medicare PPO | Source: Ambulatory Visit | Attending: Hematology and Oncology | Admitting: Hematology and Oncology

## 2020-12-16 ENCOUNTER — Ambulatory Visit: Payer: Medicare PPO | Admitting: Hematology and Oncology

## 2020-12-16 ENCOUNTER — Other Ambulatory Visit: Payer: Self-pay

## 2020-12-16 DIAGNOSIS — K8020 Calculus of gallbladder without cholecystitis without obstruction: Secondary | ICD-10-CM | POA: Diagnosis not present

## 2020-12-16 DIAGNOSIS — C787 Secondary malignant neoplasm of liver and intrahepatic bile duct: Secondary | ICD-10-CM | POA: Diagnosis not present

## 2020-12-16 DIAGNOSIS — J9811 Atelectasis: Secondary | ICD-10-CM | POA: Diagnosis not present

## 2020-12-16 DIAGNOSIS — C569 Malignant neoplasm of unspecified ovary: Secondary | ICD-10-CM | POA: Diagnosis not present

## 2020-12-16 DIAGNOSIS — C561 Malignant neoplasm of right ovary: Secondary | ICD-10-CM | POA: Diagnosis not present

## 2020-12-16 DIAGNOSIS — I2510 Atherosclerotic heart disease of native coronary artery without angina pectoris: Secondary | ICD-10-CM | POA: Diagnosis not present

## 2020-12-16 MED ORDER — IOHEXOL 9 MG/ML PO SOLN
ORAL | Status: AC
  Filled 2020-12-16: qty 1000

## 2020-12-16 MED ORDER — IOHEXOL 300 MG/ML  SOLN
100.0000 mL | Freq: Once | INTRAMUSCULAR | Status: AC | PRN
  Administered 2020-12-16: 100 mL via INTRAVENOUS

## 2020-12-16 MED ORDER — HEPARIN SOD (PORK) LOCK FLUSH 100 UNIT/ML IV SOLN: 500.0000 [IU] | Freq: Once | INTRAVENOUS | Status: AC

## 2020-12-16 MED ORDER — HEPARIN SOD (PORK) LOCK FLUSH 100 UNIT/ML IV SOLN
INTRAVENOUS | Status: AC
  Administered 2020-12-16: 500 [IU] via INTRAVENOUS
  Filled 2020-12-16: qty 5

## 2020-12-16 MED ORDER — IOHEXOL 9 MG/ML PO SOLN
500.0000 mL | ORAL | Status: AC
  Administered 2020-12-16 (×2): 500 mL via ORAL

## 2020-12-17 ENCOUNTER — Other Ambulatory Visit: Payer: Self-pay

## 2020-12-17 ENCOUNTER — Inpatient Hospital Stay: Payer: Medicare PPO | Admitting: Hematology and Oncology

## 2020-12-17 ENCOUNTER — Encounter: Payer: Self-pay | Admitting: Hematology and Oncology

## 2020-12-17 DIAGNOSIS — Z7189 Other specified counseling: Secondary | ICD-10-CM

## 2020-12-17 DIAGNOSIS — C561 Malignant neoplasm of right ovary: Secondary | ICD-10-CM

## 2020-12-17 DIAGNOSIS — I10 Essential (primary) hypertension: Secondary | ICD-10-CM | POA: Diagnosis not present

## 2020-12-17 DIAGNOSIS — C787 Secondary malignant neoplasm of liver and intrahepatic bile duct: Secondary | ICD-10-CM

## 2020-12-17 DIAGNOSIS — E7800 Pure hypercholesterolemia, unspecified: Secondary | ICD-10-CM

## 2020-12-17 NOTE — Assessment & Plan Note (Signed)
I reviewed the goals of care with the patient again We will proceed with treatment as discussed I plan to repeat imaging study again in 3 months

## 2020-12-17 NOTE — Assessment & Plan Note (Signed)
She was taking Lipitor in the past but that was discontinued when she is in hospice She is wondering whether she should resume cholesterol medicine in the presence of liver metastasis and abnormal liver enzymes For now, I recommend dietary modification and to hold of resumption of Lipitor

## 2020-12-17 NOTE — Progress Notes (Signed)
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[facility]) I have reviewed multiple imaging studies with the patient Despite evidence of progression of disease in her liver and peritoneum, she is not symptomatic She tolerated letrozole well I plan to see her again in a month for further follow-up She is aware of signs and symptoms to watch out for  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[facility]) Her liver enzymes were recently elevated CT imaging confirmed progression of liver metastasis She is not symptomatic Observe for now  Elevated cholesterol She was taking Lipitor in the past but that was discontinued when she is in hospice She is wondering whether she should resume cholesterol medicine in the presence of liver metastasis and abnormal liver enzymes For now, I recommend dietary modification and to hold of resumption of Lipitor  Essential hypertension Her blood pressure is marginally elevated but at home, her blood pressure is better controlled She will continue prescribed antihypertensives for now  Goals of care, counseling/discussion I reviewed the goals of care with the patient again We will proceed with treatment as discussed I plan to repeat imaging study again in 3 months   No orders of the defined types were placed in this encounter.   All questions were answered. The patient knows to call the clinic with any problems, questions or concerns. The total time spent in the appointment was 40 minutes encounter with patients including review of chart and various tests results, discussions about plan of care and coordination of care plan   Heath Lark, MD 12/17/2020 3:00 PM  INTERVAL HISTORY: Please see below for problem oriented charting. She returns to review test results She complained of fatigue Denies hot flashes, mood swing or changes in mood Her appetite is  good She denies abdominal pain, nausea or changes in bowel habits She has several questions related to imaging studies and medication, whether she should resume Lipitor  SUMMARY OF ONCOLOGIC HISTORY: Oncology History Overview Note  Neg genetics. Additional tests revealed ER: 80%, PR 3% MSI stable Progressed on Avastin, Doxil, Taxol and Gemzar Allergic to carboplatin   Right ovarian epithelial cancer ([place])  07/01/2015 Initial Diagnosis   She presented with postmenopausal bleeding   07/02/2015 Imaging   US pelvis showed bilateral ovarian cyst   07/13/2015 Imaging   5.2 cm left adnexal cystic lesion, with indeterminate but probably benign characteristics. In a postmenopausal female, consider continued annual imaging followup with CT or MRI versus surgical evaluation.  Colonic diverticulosis. No radiographic evidence of diverticulitis.  Cholelithiasis.  No radiographic evidence of cholecystitis.  Small hiatal hernia.   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  The tumor involves serosal surface of the right ovary and adjacent right fallopian tube.  Cervix and endometrium were within normal limits. ER positive Peritoneal washing was positive.   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   08/13/2015 Tumor Marker   Patient's tumor was tested for the following markers: CA-125 Results of the tumor marker test revealed 96   08/25/2015 - 12/08/2015 Chemotherapy   The patient had 6 cycles of carboplatin and taxol   09/07/2015 Tumor Marker   Patient's tumor was tested  for the following markers: CA-125 Results of the tumor marker test revealed 51   10/05/2015 Tumor Marker    Patient's tumor was tested for the following markers: CA-125 Results of the tumor marker test revealed 29   11/09/2015 Tumor Marker   Patient's tumor was tested for the following markers: CA-125 Results of the tumor marker test revealed 44   12/08/2015 Tumor Marker   Patient's tumor was tested for the following markers: CA-125 Results of the tumor marker test revealed 30   12/15/2015 Genetic Testing   Genetics testing normal by [employer] Breast Ovarian panel 12-15-15   01/11/2016 Imaging   1. The only finding of note are several adjacent peripheral abnormal hypodensities along the anterior superior spleen margin, differential diagnostic considerations including small peripheral interval splenic infarcts, splenic injury with subcapsular a fluid collections, or less likely metastatic disease to the splenic margin. This likely merits observation. 2. Other imaging findings of potential clinical significance: Small type 1 hiatal hernia. Aortoiliac atherosclerotic vascular disease. Lower lumbar spondylosis and degenerative disc disease. Gallstones with potential mild gallbladder wall thickening.   03/07/2016 Tumor Marker   Patient's tumor was tested for the following markers: CA-125 Results of the tumor marker test revealed 24.2   04/06/2016 Imaging   1. No evidence of a ovarian cancer metastasis. 2. No ascites. 3. Post hysterectomy and oophorectomy. 4. Cholelithiasis with several large gallstones   04/06/2016 Tumor Marker   Patient's tumor was tested for the following markers: CA-125 Results of the tumor marker test revealed 22.8   05/30/2016 Tumor Marker   Patient's tumor was tested for the following markers: CA-125 Results of the tumor marker test revealed 21   09/07/2016 Tumor Marker   Patient's tumor was tested for the following markers: CA-125 Results of the tumor marker test revealed 22.4   11/28/2016 Tumor Marker   Patient's tumor was tested for the following markers: CA-125 Results of  the tumor marker test revealed 18.8   02/23/2017 Tumor Marker   Patient's tumor was tested for the following markers: CA-125 Results of the tumor marker test revealed 19.1   06/02/2017 Tumor Marker   Patient's tumor was tested for the following markers: CA-125 Results of the tumor marker test revealed 18.3   08/24/2017 Mammogram   Pt reports mammogram complete   08/28/2017 Tumor Marker   Patient's tumor was tested for the following markers: CA-125 Results of the tumor marker test revealed 21.1   12/01/2017 Tumor Marker   Patient's tumor was tested for the following markers: CA-125 Results of the tumor marker test revealed 31.2   12/13/2017 Imaging   New 2.7 cm peritoneal soft tissue mass in anterior left lower quadrant, consistent with metastatic disease.  No other sites of metastatic disease identified.  Incidental findings including: Cholelithiasis. Colonic diverticulosis. Tiny hiatal hernia. Aortic atherosclerosis.   01/09/2018 - 05/07/2018 Chemotherapy   The patient had carboplatin and taxol; After 2nd cycle, she developed carboplatin allergy. She received carboplatin desensitization protocol at [facility] for final 4 cycles, completed by 6/17. Avastin was added from 04/16/18 onwards   01/30/2018 Adverse Reaction   Potential carboplatin allergy is suspected. She received half the dose of prescribed carboplatin on cycle 2   05/31/2018 Tumor Marker   Patient's tumor was tested for the following markers: CA-125 Results of the tumor marker test revealed 26   05/31/2018 Imaging   1. Peritoneal soft tissue nodule identified on the previous study has decreased in the interval the. No progressive findings in the abdomen or  pelvis on today's study to suggest disease progression. 2. Tiny pulmonary nodules, likely benign. Attention on follow-up recommended. 3. Cholelithiasis. 4.  Aortic Atherosclerois (ICD10-170.0) 5. Tiny hiatal hernia.     06/04/2018 - 07/30/2019 Chemotherapy   The patient is  placed on Avastin for maintenance   06/25/2018 Tumor Marker   Patient's tumor was tested for the following markers: CA-125 Results of the tumor marker test revealed 22.5   08/06/2018 Tumor Marker   Patient's tumor was tested for the following markers: CA-125 Results of the tumor marker test revealed 20.7   09/14/2018 Imaging   Status post hysterectomy and bilateral salpingo-oophorectomy.  Stable soft tissue nodule beneath the left lower anterior abdominal wall, likely reflecting stable peritoneal disease.  Scattered small subpleural nodules in the lungs bilaterally, measuring up to 3 mm, technically indeterminate although likely benign. Please note that Fleischner Society guidelines do not apply. Attention on follow-up is suggested.  No evidence of new/progressive metastatic disease.   09/14/2018 Tumor Marker   Patient's tumor was tested for the following markers: CA-125 Results of the tumor marker test revealed 21.3   10/30/2018 Tumor Marker   Patient's tumor was tested for the following markers: CA-125 Results of the tumor marker test revealed 20.8   12/12/2018 Tumor Marker   Patient's tumor was tested for the following markers: CA-125 Results of the tumor marker test revealed 19.7   01/01/2019 Tumor Marker   Patient's tumor was tested for the following markers: CA-125 Results of the tumor marker test revealed 21.6   01/22/2019 Tumor Marker   Patient's tumor was tested for the following markers: CA-125 Results of the tumor marker test revealed 21.3   02/12/2019 Tumor Marker   Patient's tumor was tested for the following markers: CA-125 Results of the tumor marker test revealed 21.6   04/16/2019 Tumor Marker   Patient's tumor was tested for the following markers: CA-125 Results of the tumor marker test revealed 21.7   05/07/2019 Tumor Marker   Patient's tumor was tested for the following markers: CA-125 Results of the tumor marker test revealed 27.3   05/28/2019 Tumor Marker    Patient's tumor was tested for the following markers: CA-125 Results of the tumor marker test revealed 24.5   07/09/2019 - 08/19/2019 Chemotherapy   The patient had bevacizumab for chemotherapy treatment.     07/09/2019 Tumor Marker   Patient's tumor was tested for the following markers: CA-125 Results of the tumor marker test revealed 23.3.   08/06/2019 Imaging   Ct abdomen and pelvis 1. New hypodense lesions of the left lobe of the liver measuring 2.5 x 2.5 cm (series 2, image 16) and right lobe of the liver measuring 1.1 x 1.1 cm (series 2, image 23), concerning for metastatic disease.   2. Interval enlargement of an irregular nodule of the omentum measuring 1.0 x 1.0 cm, previously 1.0 x 0.5 cm (series 2, image 51), concerning for peritoneal disease.   3.  Status post hysterectomy.   4. Other chronic and incidental findings as detailed above. Aortic Atherosclerosis (ICD10-I70.0).   08/16/2019 - 01/17/2020 Chemotherapy   The patient had weekly Taxol for chemotherapy treatment.     08/23/2019 Tumor Marker   Patient's tumor was tested for the following markers: CA-125 Results of the tumor marker test revealed 33.7.   09/06/2019 Tumor Marker   Patient's tumor was tested for the following markers: CA-125 Results of the tumor marker test revealed 32.2   10/14/2019 Imaging   1. Interval decrease in hypodense  masses of the left lobe and right lobe of the liver, mass in the left lobe measuring 1.7 x 1.6 cm, previously 2.5 x 2.5 cm (series 2, image 14) and in the right lobe of the liver measuring 0.8 x 0.7 cm, previously 1.1 x 1.1 cm (series 2, image 22).   2. Interval decrease in size of an omental nodule, measuring 0.8 x 0.7 cm, previously 1.0 x 1.0 cm (series 2, image 55).   3. Findings are consistent with improved metastatic disease. No new evidence of metastatic disease in the abdomen or pelvis.   4.  Status post hysterectomy.   5.  Cholelithiasis.   6.  Aortic Atherosclerosis  (ICD10-I70.0).   01/23/2020 Imaging   1. 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 2. Slight increase in size of left lower quadrant omental soft tissue nodule, consistent with metastatic disease. 3. No new sites of metastatic disease identified. 4. Stable cholelithiasis and gallbladder wall thickening. No evidence of acute cholecystitis. 5. Colonic diverticulosis. No radiographic evidence of diverticulitis. 6. Tiny hiatal hernia.   01/28/2020 Echocardiogram    1. Left ventricular ejection fraction, by estimation, is 60 to 65%. The left ventricle has normal function. The left ventricle has no regional wall motion abnormalities. Left ventricular diastolic parameters are consistent with Grade I diastolic dysfunction (impaired relaxation). The average left ventricular global longitudinal strain is -19.7 % (normal), however this may be underestimated given suboptimal tracking.  2. Right ventricular systolic function is normal. The right ventricular size is normal.  3. The mitral valve is normal in structure. Mild mitral valve regurgitation. No evidence of mitral stenosis.  4. The aortic valve is normal in structure. Aortic valve regurgitation is not visualized. No aortic stenosis is present.  5. The inferior vena cava is normal in size with greater than 50% respiratory variability, suggesting right atrial pressure of 3 mmHg.   Conclusion(s)/Recommendation(s): Normal biventricular function without evidence of hemodynamically significant valvular heart disease   01/31/2020 - 04/23/2020 Chemotherapy   The patient had DOXOrubicin for chemotherapy treatment.     04/16/2020 Imaging   1. Mild increase in size of metastasis involving the left hepatic lobe. 2. Stable small left lower quadrant omental soft tissue nodule, suspicious for peritoneal metastasis. 3. No new sites of metastatic disease identified. 4. Cholelithiasis. No radiographic evidence of cholecystitis. 5. Colonic diverticulosis. No  radiographic evidence of diverticulitis. 6. Small hiatal hernia.   Aortic Atherosclerosis (ICD10-I70.0).   05/01/2020 -  Chemotherapy   The patient had gemcitabine for chemotherapy treatment.     07/16/2020 Imaging   1. 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 Slight enlargement of the larger metastatic lesion in the LEFT hepatic lobe. 2. Mild increase in volume of small LEFT lower quadrant peritoneal nodule. 3. No ascites. 4. Chronic enhancement of the gallbladder fundus.  Cholelithiasis.   12/16/2020 Imaging   1. Increased burden of hepatic metastases and peritoneal implants. No abdominopelvic ascites. 2. No evidence of metastatic disease within the chest. 3. 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 4. Aortic atherosclerosis.     REVIEW OF SYSTEMS:   Constitutional: Denies fevers, chills or abnormal weight loss Eyes: Denies blurriness of vision Ears, nose, mouth, throat, and face: Denies mucositis or sore throat Respiratory: Denies cough, dyspnea or wheezes Cardiovascular: Denies palpitation, chest discomfort or lower extremity swelling Gastrointestinal:  Denies nausea, heartburn or change in bowel habits Skin: Denies abnormal skin rashes Lymphatics: Denies new lymphadenopathy or easy  bruising Neurological:Denies numbness, tingling or new weaknesses Behavioral/Psych: Mood is stable, no new changes  All other systems were reviewed with the patient and are negative.  I have reviewed the past medical history, past surgical history, social history and family history with the patient and they are unchanged from previous note.  ALLERGIES:  is allergic to carboplatin.  MEDICATIONS:  Current Outpatient Medications  Medication Sig Dispense Refill  . amLODipine (NORVASC) 10 MG tablet Take 1 tablet (10 mg total) by mouth daily. 90 tablet 3  . atenolol (TENORMIN) 25 MG tablet Take 1 tablet (25 mg  total) by mouth daily. 30 tablet 9  . Coenzyme Q10 (CO Q 10) 100 MG CAPS Take 1 capsule by mouth daily.     . Glucosamine-Chondroit-Vit C-Mn (GLUCOSAMINE 1500 COMPLEX PO) Take 1 capsule by mouth 2 (two) times daily.    [name] letrozole (FEMARA) 2.5 MG tablet Take 1 tablet (2.5 mg total) by mouth daily. 30 tablet 11  . lidocaine-prilocaine (EMLA) cream Apply to affected area once 30 g 3  . loratadine (CLARITIN) 10 MG tablet Take 10 mg by mouth daily as needed for allergies (hives).    . Multiple Vitamin (MULTIVITAMIN) capsule Take 1 capsule by mouth daily.     . Omega-3 Fatty Acids (OMEGA-3 FISH OIL) 300 MG CAPS Take 1 capsule by mouth daily.     . ondansetron (ZOFRAN) 8 MG tablet Take 1 tablet (8 mg total) by mouth every 8 (eight) hours as needed for refractory nausea / vomiting. 30 tablet 1  . Polyethyl Glycol-Propyl Glycol (SYSTANE ULTRA OP) Apply 1 drop to eye 2 (two) times daily at 10 AM and 5 PM.    . Probiotic Product (PROBIOTIC ADVANCED PO) Take 1 capsule by mouth daily.    . prochlorperazine (COMPAZINE) 10 MG tablet Take 1 tablet (10 mg total) by mouth every 6 (six) hours as needed (Nausea or vomiting). 60 tablet 1  . vitamin E 400 UNIT capsule Take 400 Units by mouth every evening.      No current facility-administered medications for this visit.    PHYSICAL EXAMINATION: ECOG PERFORMANCE STATUS: 1 - Symptomatic but completely ambulatory  Vitals:   12/17/20 1347  BP: (!) 143/53  Pulse: 65  Resp: 18  Temp: (!) 97 F (36.1 C)  SpO2: 100%   Filed Weights   12/17/20 1347  Weight: 138 lb 12.8 oz (63 kg)    GENERAL:alert, no distress and comfortable SKIN: skin color, texture, turgor are normal, no rashes or significant lesions EYES: normal, Conjunctiva are pink and non-injected, sclera clear OROPHARYNX:no exudate, no erythema and lips, buccal mucosa, and tongue normal  NECK: supple, thyroid normal size, non-tender, without nodularity LYMPH:  no palpable lymphadenopathy in the  cervical, axillary or inguinal LUNGS: clear to auscultation and percussion with normal breathing effort HEART: regular rate & rhythm and no murmurs and no lower extremity edema ABDOMEN:abdomen soft, non-tender and normal bowel sounds Musculoskeletal:no cyanosis of digits and no clubbing  NEURO: alert & oriented x 3 with fluent speech, no focal motor/sensory deficits  LABORATORY DATA:  I have reviewed the data as listed    Component Value Date/Time   NA 138 12/09/2020 1117   NA 142 11/28/2016 0916   K 4.2 12/09/2020 1117   K 3.9 11/28/2016 0916   CL 105 12/09/2020 1117   CO2 24 12/09/2020 1117   CO2 28 11/28/2016 0916   GLUCOSE 94 12/09/2020 1117   GLUCOSE 78 11/28/2016 0916   BUN 21 12/09/2020  1117   BUN 16.8 11/28/2016 0916   CREATININE 0.82 12/09/2020 1117   CREATININE 0.8 11/28/2016 0916   CALCIUM 9.5 12/09/2020 1117   CALCIUM 10.5 (H) 11/28/2016 0916   PROT 7.2 12/09/2020 1117   PROT 7.3 11/28/2016 0916   ALBUMIN 3.8 12/09/2020 1117   ALBUMIN 4.3 11/28/2016 0916   AST 70 (H) 12/09/2020 1117   AST 19 11/28/2016 0916   ALT 30 12/09/2020 1117   ALT 19 11/28/2016 0916   ALKPHOS 182 (H) 12/09/2020 1117   ALKPHOS 50 11/28/2016 0916   BILITOT 0.5 12/09/2020 1117   BILITOT 0.48 11/28/2016 0916   GFRNONAA >60 12/09/2020 1117   GFRAA >60 08/14/2020 1305    No results found for: SPEP, UPEP  Lab Results  Component Value Date   WBC 4.7 12/09/2020   NEUTROABS 2.9 12/09/2020   HGB 12.1 12/09/2020   HCT 36.9 12/09/2020   MCV 90.9 12/09/2020   PLT 148 (L) 12/09/2020      Chemistry      Component Value Date/Time   NA 138 12/09/2020 1117   NA 142 11/28/2016 0916   K 4.2 12/09/2020 1117   K 3.9 11/28/2016 0916   CL 105 12/09/2020 1117   CO2 24 12/09/2020 1117   CO2 28 11/28/2016 0916   BUN 21 12/09/2020 1117   BUN 16.8 11/28/2016 0916   CREATININE 0.82 12/09/2020 1117   CREATININE 0.8 11/28/2016 0916      Component Value Date/Time   CALCIUM 9.5 12/09/2020 1117    CALCIUM 10.5 (H) 11/28/2016 0916   ALKPHOS 182 (H) 12/09/2020 1117   ALKPHOS 50 11/28/2016 0916   AST 70 (H) 12/09/2020 1117   AST 19 11/28/2016 0916   ALT 30 12/09/2020 1117   ALT 19 11/28/2016 0916   BILITOT 0.5 12/09/2020 1117   BILITOT 0.48 11/28/2016 0916       RADIOGRAPHIC STUDIES: I have reviewed multiple imaging studies with the patient I have personally reviewed the radiological images as listed and agreed with the findings in the report. CT CHEST ABDOMEN PELVIS W CONTRAST  Result Date: 12/16/2020 CLINICAL DATA:  Restaging ovarian cancer, history of right ovarian epithelial cancer with Mets to liver EXAM: CT CHEST, ABDOMEN, AND PELVIS WITH CONTRAST TECHNIQUE: Multidetector CT imaging of the chest, abdomen and pelvis was performed following the standard protocol during bolus administration of intravenous contrast. CONTRAST:  135m OMNIPAQUE IOHEXOL 300 MG/ML  SOLN COMPARISON:  Multiple priors including CT abdomen pelvis July 16, 2020 and CT chest abdomen and pelvis September 14, 2018. FINDINGS: CT CHEST FINDINGS Cardiovascular: Accessed right chest wall port with tip at the superior cavoatrial junction. Normal size heart. Coronary artery calcifications. Aortic atherosclerosis. Mediastinum/Nodes: No pathologically enlarged mediastinal, hilar or axillary lymph nodes. Small hiatal hernia. Stable size of the hypodense left 1.1 cm thyroid nodule. Not clinically significant; no follow-up imaging recommended. (Ref: J Am Coll Radiol. 2015 Feb;12(2): 143-50). Lungs/Pleura: No pleural effusion. No pneumothorax. Scattered subsegmental atelectasis. No suspicious pulmonary nodules or masses. Musculoskeletal: No chest wall mass or suspicious bone lesions identified. CT ABDOMEN PELVIS FINDINGS Hepatobiliary: Increase in size and number of hepatic metastases. For reference: -hypoenhancing mass in the left hepatic lobe now measures 6.5 x 5.3 cm previously 3.6 x 3.3 cm (series 2, image 54). -hypoenhancing  lesion in the right lobe of the liver now measures 2.5 x 1.6 cm previously 1.6 x 1.4 cm (series 2, image 61). Cholelithiasis. There is similar focal gallbladder wall thickening in the fundal region, likely due  to adenomyomatosis. No signs of acute cholecystitis. No biliary ductal dilatation. Pancreas: Unremarkable. No pancreatic ductal dilatation or surrounding inflammatory changes. Spleen: Normal in size without focal abnormality. Adrenals/Urinary Tract: Adrenal glands are unremarkable. Kidneys are normal, without renal calculi, focal lesion, or hydronephrosis. Bladder is unremarkable. Stomach/Bowel: Small hiatal hernia. Stomach is probably decompressed however there is no suspicious wall thickening visualized. No evidence of small-bowel obstruction or suspicious wall thickening. Colonic diverticulosis. Vascular/Lymphatic: Aortic atherosclerosis. No pathologically enlarged abdominal or pelvic lymph nodes. Reproductive: Status post hysterectomy. No adnexal masses. Other: Increase in size and number of peritoneal nodules most notably increased along the anterior aspect of the abdomen (series 2 image 84-102) with at least 1 additional area of nodularity adjacent to the rectum (series 2, image 100). No abdominopelvic ascites. Musculoskeletal: No suspicious lytic or blastic lesions of bone. IMPRESSION: 1. Increased burden of hepatic metastases and peritoneal implants. No abdominopelvic ascites. 2. No evidence of metastatic disease within the chest. 3. 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 4. Aortic atherosclerosis. Aortic Atherosclerosis (ICD10-I70.0). Electronically Signed   By: Dahlia Bailiff MD   On: 12/16/2020 16:36

## 2020-12-17 NOTE — Assessment & Plan Note (Signed)
Her liver enzymes were recently elevated CT imaging confirmed progression of liver metastasis She is not symptomatic Observe for now

## 2020-12-17 NOTE — Assessment & Plan Note (Signed)
I have reviewed multiple imaging studies with the patient Despite evidence of progression of disease in her liver and peritoneum, she is not symptomatic She tolerated letrozole well I plan to see her again in a month for further follow-up She is aware of signs and symptoms to watch out for

## 2020-12-17 NOTE — Assessment & Plan Note (Signed)
Her blood pressure is marginally elevated but at home, her blood pressure is better controlled She will continue prescribed antihypertensives for now

## 2021-01-14 ENCOUNTER — Other Ambulatory Visit: Payer: Self-pay | Admitting: Hematology and Oncology

## 2021-01-14 ENCOUNTER — Encounter: Payer: Self-pay | Admitting: Hematology and Oncology

## 2021-01-14 DIAGNOSIS — Z7189 Other specified counseling: Secondary | ICD-10-CM

## 2021-01-14 DIAGNOSIS — C561 Malignant neoplasm of right ovary: Secondary | ICD-10-CM

## 2021-01-14 MED ORDER — ONDANSETRON HCL 8 MG PO TABS: 8.0000 mg | ORAL_TABLET | Freq: Three times a day (TID) | ORAL | 1 refills | Status: AC | PRN

## 2021-01-18 ENCOUNTER — Telehealth: Payer: Self-pay

## 2021-01-18 ENCOUNTER — Inpatient Hospital Stay: Payer: Medicare PPO

## 2021-01-18 ENCOUNTER — Encounter: Payer: Self-pay | Admitting: Hematology and Oncology

## 2021-01-18 ENCOUNTER — Telehealth: Payer: Self-pay | Admitting: Hematology and Oncology

## 2021-01-18 ENCOUNTER — Inpatient Hospital Stay: Payer: Medicare PPO | Attending: Hematology and Oncology | Admitting: Hematology and Oncology

## 2021-01-18 ENCOUNTER — Other Ambulatory Visit: Payer: Self-pay

## 2021-01-18 VITALS — BP 148/51 | HR 67 | Temp 97.8°F | Resp 18 | Ht 62.2 in | Wt 139.0 lb

## 2021-01-18 DIAGNOSIS — R5383 Other fatigue: Secondary | ICD-10-CM | POA: Insufficient documentation

## 2021-01-18 DIAGNOSIS — C561 Malignant neoplasm of right ovary: Secondary | ICD-10-CM

## 2021-01-18 DIAGNOSIS — R110 Nausea: Secondary | ICD-10-CM | POA: Diagnosis not present

## 2021-01-18 DIAGNOSIS — Z7189 Other specified counseling: Secondary | ICD-10-CM

## 2021-01-18 DIAGNOSIS — C787 Secondary malignant neoplasm of liver and intrahepatic bile duct: Secondary | ICD-10-CM | POA: Diagnosis not present

## 2021-01-18 LAB — CMP (CANCER CENTER ONLY)
ALT: 28 U/L (ref 0–44)
AST: 106 U/L — ABNORMAL HIGH (ref 15–41)
Albumin: 3.4 g/dL — ABNORMAL LOW (ref 3.5–5.0)
Alkaline Phosphatase: 299 U/L — ABNORMAL HIGH (ref 38–126)
Anion gap: 10 (ref 5–15)
BUN: 16 mg/dL (ref 8–23)
CO2: 25 mmol/L (ref 22–32)
Calcium: 9.1 mg/dL (ref 8.9–10.3)
Chloride: 104 mmol/L (ref 98–111)
Creatinine: 0.79 mg/dL (ref 0.44–1.00)
GFR, Estimated: >60 mL/min (ref >60)
Glucose, Bld: 100 mg/dL — ABNORMAL HIGH (ref 70–99)
Potassium: 4.2 mmol/L (ref 3.5–5.1)
Sodium: 139 mmol/L (ref 135–145)
Total Bilirubin: 0.5 mg/dL (ref 0.3–1.2)
Total Protein: 6.7 g/dL (ref 6.5–8.1)

## 2021-01-18 LAB — CBC WITH DIFFERENTIAL (CANCER CENTER ONLY)
Abs Immature Granulocytes: 0.02 10*3/uL (ref 0.00–0.07)
Basophils Absolute: 0 10*3/uL (ref 0.0–0.1)
Basophils Relative: 1 %
Eosinophils Absolute: 0.1 10*3/uL (ref 0.0–0.5)
Eosinophils Relative: 2 %
HCT: 34.9 % — ABNORMAL LOW (ref 36.0–46.0)
Hemoglobin: 11.5 g/dL — ABNORMAL LOW (ref 12.0–15.0)
Immature Granulocytes: 0 %
Lymphocytes Relative: 22 %
Lymphs Abs: 1.1 10*3/uL (ref 0.7–4.0)
MCH: 29.7 pg (ref 26.0–34.0)
MCHC: 33 g/dL (ref 30.0–36.0)
MCV: 90.2 fL (ref 80.0–100.0)
Monocytes Absolute: 0.8 10*3/uL (ref 0.1–1.0)
Monocytes Relative: 14 %
Neutro Abs: 3.2 10*3/uL (ref 1.7–7.7)
Neutrophils Relative %: 61 %
Platelet Count: 154 10*3/uL (ref 150–400)
RBC: 3.87 MIL/uL (ref 3.87–5.11)
RDW: 13.2 % (ref 11.5–15.5)
WBC Count: 5.3 10*3/uL (ref 4.0–10.5)
nRBC: 0 % (ref 0.0–0.2)

## 2021-01-18 MED ORDER — SODIUM CHLORIDE 0.9% FLUSH
10.0000 mL | Freq: Once | INTRAVENOUS | Status: AC
  Administered 2021-01-18: 10 mL
  Filled 2021-01-18: qty 10

## 2021-01-18 MED ORDER — HEPARIN SOD (PORK) LOCK FLUSH 100 UNIT/ML IV SOLN
500.0000 [IU] | Freq: Once | INTRAVENOUS | Status: AC
  Administered 2021-01-18: 500 [IU]
  Filled 2021-01-18: qty 5

## 2021-01-18 NOTE — Assessment & Plan Note (Signed)
She has some mild nausea but no vomiting She will continue antiemetics

## 2021-01-18 NOTE — Telephone Encounter (Signed)
Called and given below message. She verbalized understanding. 

## 2021-01-18 NOTE — Telephone Encounter (Signed)
Scheduled appts per 28 sch msg. Pt stated she would refer to mychart for AVS and appts

## 2021-01-18 NOTE — Assessment & Plan Note (Signed)
Her liver enzymes are mildly elevated but I do not believe this is the cause of her fatigue Observe closely for now

## 2021-01-18 NOTE — Assessment & Plan Note (Signed)
She complained of fatigue Recent blood work showed mildly elevated liver enzymes We will continue with letrozole as prescribed I plan to repeat CT imaging in April

## 2021-01-18 NOTE — Telephone Encounter (Signed)
-----   Message from Heath Lark, MD sent at 01/18/2021 10:47 AM EST ----- Regarding: liver panel Let her know liver enzymes a bit high Watching it for now, no changes to her med

## 2021-01-18 NOTE — Progress Notes (Signed)
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 CT ABDOMEN PELVIS W CONTRAST    Standing Status:   Future    Standing Expiration Date:   01/18/2022    Order Specific Question:   If indicated for the ordered procedure, I authorize the administration of contrast media per Radiology protocol    Answer:   Yes    Order Specific Question:   Preferred imaging location?    Answer:   [facility]    Order Specific Question:   Radiology Contrast Protocol - do NOT remove file path    Answer:   [email or web address]    All questions were answered. The patient knows to call the clinic with any problems, questions or concerns. The total time spent in the appointment was 20 minutes encounter with patients including review of chart and various tests results, discussions about plan of care and coordination of care plan   Heath Lark, MD 01/18/2021 3:16 PM  INTERVAL HISTORY: Please see below for problem oriented charting. She returns for further follow-up She complained of some mild fatigue and anorexia but she has not lost any weight She had some mild nausea but Zofran appears to be helpful She denies abdominal pain  SUMMARY OF ONCOLOGIC HISTORY: Oncology History Overview Note  Neg genetics. Additional tests revealed ER: 80%, PR 3% MSI stable Progressed on Avastin, Doxil,  Taxol and Gemzar Allergic to carboplatin   Right ovarian epithelial cancer ([place])  07/01/2015 Initial Diagnosis   She presented with postmenopausal bleeding   07/02/2015 Imaging   US pelvis showed bilateral ovarian cyst   07/13/2015 Imaging   5.2 cm left adnexal cystic lesion, with indeterminate but probably benign characteristics. In a postmenopausal female, consider continued annual imaging followup with CT or MRI versus surgical evaluation.  Colonic diverticulosis. No radiographic evidence of diverticulitis.  Cholelithiasis.  No radiographic evidence of cholecystitis.  Small hiatal hernia.   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  The tumor involves serosal surface of the right ovary and adjacent right fallopian tube.  Cervix and endometrium were within normal limits. ER positive Peritoneal washing was positive.   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   08/13/2015 Tumor Marker   Patient's tumor was tested for the following markers: CA-125 Results of the tumor marker test revealed 96   08/25/2015 - 12/08/2015 Chemotherapy   The patient had 6 cycles of carboplatin and taxol   09/07/2015 Tumor Marker   Patient's tumor was tested for the following markers: CA-125 Results of the tumor marker test revealed 51   10/05/2015 Tumor Marker   Patient's tumor was tested for the following markers: CA-125 Results of the  tumor marker test revealed 29   11/09/2015 Tumor Marker   Patient's tumor was tested for the following markers: CA-125 Results of the tumor marker test revealed 44   12/08/2015 Tumor Marker   Patient's tumor was tested for the following markers:  CA-125 Results of the tumor marker test revealed 30   12/15/2015 Genetic Testing   Genetics testing normal by [facility] Breast Ovarian panel 12-15-15   01/11/2016 Imaging   1. The only finding of note are several adjacent peripheral abnormal hypodensities along the anterior superior spleen margin, differential diagnostic considerations including small peripheral interval splenic infarcts, splenic injury with subcapsular a fluid collections, or less likely metastatic disease to the splenic margin. This likely merits observation. 2. Other imaging findings of potential clinical significance: Small type 1 hiatal hernia. Aortoiliac atherosclerotic vascular disease. Lower lumbar spondylosis and degenerative disc disease. Gallstones with potential mild gallbladder wall thickening.   03/07/2016 Tumor Marker   Patient's tumor was tested for the following markers: CA-125 Results of the tumor marker test revealed 24.2   04/06/2016 Imaging   1. No evidence of a ovarian cancer metastasis. 2. No ascites. 3. Post hysterectomy and oophorectomy. 4. Cholelithiasis with several large gallstones   04/06/2016 Tumor Marker   Patient's tumor was tested for the following markers: CA-125 Results of the tumor marker test revealed 22.8   05/30/2016 Tumor Marker   Patient's tumor was tested for the following markers: CA-125 Results of the tumor marker test revealed 21   09/07/2016 Tumor Marker   Patient's tumor was tested for the following markers: CA-125 Results of the tumor marker test revealed 22.4   11/28/2016 Tumor Marker   Patient's tumor was tested for the following markers: CA-125 Results of the tumor marker test revealed 18.8   02/23/2017 Tumor Marker   Patient's tumor was tested for the following markers: CA-125 Results of the tumor marker test revealed 19.1   06/02/2017 Tumor Marker   Patient's tumor was tested for the following markers: CA-125 Results of the tumor marker test revealed 18.3   08/24/2017  Mammogram   Pt reports mammogram complete   08/28/2017 Tumor Marker   Patient's tumor was tested for the following markers: CA-125 Results of the tumor marker test revealed 21.1   12/01/2017 Tumor Marker   Patient's tumor was tested for the following markers: CA-125 Results of the tumor marker test revealed 31.2   12/13/2017 Imaging   New 2.7 cm peritoneal soft tissue mass in anterior left lower quadrant, consistent with metastatic disease.  No other sites of metastatic disease identified.  Incidental findings including: Cholelithiasis. Colonic diverticulosis. Tiny hiatal hernia. Aortic atherosclerosis.   01/09/2018 - 05/07/2018 Chemotherapy   The patient had carboplatin and taxol; After 2nd cycle, she developed carboplatin allergy. She received carboplatin desensitization protocol at [facility] for final 4 cycles, completed by 6/17. Avastin was added from 04/16/18 onwards   01/30/2018 Adverse Reaction   Potential carboplatin allergy is suspected. She received half the dose of prescribed carboplatin on cycle 2   05/31/2018 Tumor Marker   Patient's tumor was tested for the following markers: CA-125 Results of the tumor marker test revealed 26   05/31/2018 Imaging   1. Peritoneal soft tissue nodule identified on the previous study has decreased in the interval the. No progressive findings in the abdomen or pelvis on today's study to suggest disease progression. 2. Tiny pulmonary nodules, likely benign. Attention on follow-up recommended. 3. Cholelithiasis. 4.  Aortic Atherosclerois (ICD10-170.0) 5. Tiny hiatal hernia.  06/04/2018 - 07/30/2019 Chemotherapy   The patient is placed on Avastin for maintenance   06/25/2018 Tumor Marker   Patient's tumor was tested for the following markers: CA-125 Results of the tumor marker test revealed 22.5   08/06/2018 Tumor Marker   Patient's tumor was tested for the following markers: CA-125 Results of the tumor marker test revealed 20.7   09/14/2018  Imaging   Status post hysterectomy and bilateral salpingo-oophorectomy.  Stable soft tissue nodule beneath the left lower anterior abdominal wall, likely reflecting stable peritoneal disease.  Scattered small subpleural nodules in the lungs bilaterally, measuring up to 3 mm, technically indeterminate although likely benign. Please note that Fleischner Society guidelines do not apply. Attention on follow-up is suggested.  No evidence of new/progressive metastatic disease.   09/14/2018 Tumor Marker   Patient's tumor was tested for the following markers: CA-125 Results of the tumor marker test revealed 21.3   10/30/2018 Tumor Marker   Patient's tumor was tested for the following markers: CA-125 Results of the tumor marker test revealed 20.8   12/12/2018 Tumor Marker   Patient's tumor was tested for the following markers: CA-125 Results of the tumor marker test revealed 19.7   01/01/2019 Tumor Marker   Patient's tumor was tested for the following markers: CA-125 Results of the tumor marker test revealed 21.6   01/22/2019 Tumor Marker   Patient's tumor was tested for the following markers: CA-125 Results of the tumor marker test revealed 21.3   02/12/2019 Tumor Marker   Patient's tumor was tested for the following markers: CA-125 Results of the tumor marker test revealed 21.6   04/16/2019 Tumor Marker   Patient's tumor was tested for the following markers: CA-125 Results of the tumor marker test revealed 21.7   05/07/2019 Tumor Marker   Patient's tumor was tested for the following markers: CA-125 Results of the tumor marker test revealed 27.3   05/28/2019 Tumor Marker   Patient's tumor was tested for the following markers: CA-125 Results of the tumor marker test revealed 24.5   07/09/2019 - 08/19/2019 Chemotherapy   The patient had bevacizumab for chemotherapy treatment.     07/09/2019 Tumor Marker   Patient's tumor was tested for the following markers: CA-125 Results of the tumor  marker test revealed 23.3.   08/06/2019 Imaging   Ct abdomen and pelvis 1. New hypodense lesions of the left lobe of the liver measuring 2.5 x 2.5 cm (series 2, image 16) and right lobe of the liver measuring 1.1 x 1.1 cm (series 2, image 23), concerning for metastatic disease.   2. Interval enlargement of an irregular nodule of the omentum measuring 1.0 x 1.0 cm, previously 1.0 x 0.5 cm (series 2, image 51), concerning for peritoneal disease.   3.  Status post hysterectomy.   4. Other chronic and incidental findings as detailed above. Aortic Atherosclerosis (ICD10-I70.0).   08/16/2019 - 01/17/2020 Chemotherapy   The patient had weekly Taxol for chemotherapy treatment.     08/23/2019 Tumor Marker   Patient's tumor was tested for the following markers: CA-125 Results of the tumor marker test revealed 33.7.   09/06/2019 Tumor Marker   Patient's tumor was tested for the following markers: CA-125 Results of the tumor marker test revealed 32.2   10/14/2019 Imaging   1. Interval decrease in hypodense masses of the left lobe and right lobe of the liver, mass in the left lobe measuring 1.7 x 1.6 cm, previously 2.5 x 2.5 cm (series 2, image 14) and in the  right lobe of the liver measuring 0.8 x 0.7 cm, previously 1.1 x 1.1 cm (series 2, image 22).   2. Interval decrease in size of an omental nodule, measuring 0.8 x 0.7 cm, previously 1.0 x 1.0 cm (series 2, image 55).   3. Findings are consistent with improved metastatic disease. No new evidence of metastatic disease in the abdomen or pelvis.   4.  Status post hysterectomy.   5.  Cholelithiasis.   6.  Aortic Atherosclerosis (ICD10-I70.0).   01/23/2020 Imaging   1. 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 2. Slight increase in size of left lower quadrant omental soft tissue nodule, consistent with metastatic disease. 3. No new sites of metastatic disease identified. 4. Stable cholelithiasis and gallbladder wall thickening. No  evidence of acute cholecystitis. 5. Colonic diverticulosis. No radiographic evidence of diverticulitis. 6. Tiny hiatal hernia.   01/28/2020 Echocardiogram    1. Left ventricular ejection fraction, by estimation, is 60 to 65%. The left ventricle has normal function. The left ventricle has no regional wall motion abnormalities. Left ventricular diastolic parameters are consistent with Grade I diastolic dysfunction (impaired relaxation). The average left ventricular global longitudinal strain is -19.7 % (normal), however this may be underestimated given suboptimal tracking.  2. Right ventricular systolic function is normal. The right ventricular size is normal.  3. The mitral valve is normal in structure. Mild mitral valve regurgitation. No evidence of mitral stenosis.  4. The aortic valve is normal in structure. Aortic valve regurgitation is not visualized. No aortic stenosis is present.  5. The inferior vena cava is normal in size with greater than 50% respiratory variability, suggesting right atrial pressure of 3 mmHg.   Conclusion(s)/Recommendation(s): Normal biventricular function without evidence of hemodynamically significant valvular heart disease   01/31/2020 - 04/23/2020 Chemotherapy   The patient had DOXOrubicin for chemotherapy treatment.     04/16/2020 Imaging   1. Mild increase in size of metastasis involving the left hepatic lobe. 2. Stable small left lower quadrant omental soft tissue nodule, suspicious for peritoneal metastasis. 3. No new sites of metastatic disease identified. 4. Cholelithiasis. No radiographic evidence of cholecystitis. 5. Colonic diverticulosis. No radiographic evidence of diverticulitis. 6. Small hiatal hernia.   Aortic Atherosclerosis (ICD10-I70.0).   05/01/2020 -  Chemotherapy   The patient had gemcitabine for chemotherapy treatment.     07/16/2020 Imaging   1. 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 Slight enlargement of the larger metastatic  lesion in the LEFT hepatic lobe. 2. Mild increase in volume of small LEFT lower quadrant peritoneal nodule. 3. No ascites. 4. Chronic enhancement of the gallbladder fundus.  Cholelithiasis.   12/16/2020 Imaging   1. Increased burden of hepatic metastases and peritoneal implants. No abdominopelvic ascites. 2. No evidence of metastatic disease within the chest. 3. 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 4. Aortic atherosclerosis.     REVIEW OF SYSTEMS:   Constitutional: Denies fevers, chills or abnormal weight loss Eyes: Denies blurriness of vision Ears, nose, mouth, throat, and face: Denies mucositis or sore throat Respiratory: Denies cough, dyspnea or wheezes Cardiovascular: Denies palpitation, chest discomfort or lower extremity swelling Skin: Denies abnormal skin rashes Lymphatics: Denies new lymphadenopathy or easy bruising Neurological:Denies numbness, tingling or new weaknesses Behavioral/Psych: Mood is stable, no new changes  All other systems were reviewed with the patient and are negative.  I have reviewed the past medical history, past surgical history, social history and family history with  the patient and they are unchanged from previous note.  ALLERGIES:  is allergic to carboplatin.  MEDICATIONS:  Current Outpatient Medications  Medication Sig Dispense Refill  . amLODipine (NORVASC) 10 MG tablet Take 1 tablet (10 mg total) by mouth daily. 90 tablet 3  . atenolol (TENORMIN) 25 MG tablet Take 1 tablet (25 mg total) by mouth daily. 30 tablet 9  . Coenzyme Q10 (CO Q 10) 100 MG CAPS Take 1 capsule by mouth daily.     . Glucosamine-Chondroit-Vit C-Mn (GLUCOSAMINE 1500 COMPLEX PO) Take 1 capsule by mouth 2 (two) times daily.    Emily Hull letrozole (FEMARA) 2.5 MG tablet Take 1 tablet (2.5 mg total) by mouth daily. 30 tablet 11  . lidocaine-prilocaine (EMLA) cream Apply to affected area once 30 g 3  .  loratadine (CLARITIN) 10 MG tablet Take 10 mg by mouth daily as needed for allergies (hives).    . Multiple Vitamin (MULTIVITAMIN) capsule Take 1 capsule by mouth daily.     . Omega-3 Fatty Acids (OMEGA-3 FISH OIL) 300 MG CAPS Take 1 capsule by mouth daily.     . ondansetron (ZOFRAN) 8 MG tablet Take 1 tablet (8 mg total) by mouth every 8 (eight) hours as needed for refractory nausea / vomiting. 90 tablet 1  . Polyethyl Glycol-Propyl Glycol (SYSTANE ULTRA OP) Apply 1 drop to eye 2 (two) times daily at 10 AM and 5 PM.    . Probiotic Product (PROBIOTIC ADVANCED PO) Take 1 capsule by mouth daily.    . prochlorperazine (COMPAZINE) 10 MG tablet Take 1 tablet (10 mg total) by mouth every 6 (six) hours as needed (Nausea or vomiting). 60 tablet 1  . vitamin E 400 UNIT capsule Take 400 Units by mouth every evening.      No current facility-administered medications for this visit.    PHYSICAL EXAMINATION: ECOG PERFORMANCE STATUS: 1 - Symptomatic but completely ambulatory  Vitals:   01/18/21 1020  BP: (!) 148/51  Pulse: 67  Resp: 18  Temp: 97.8 F (36.6 C)  SpO2: 100%   Filed Weights   01/18/21 1020  Weight: 139 lb (63 kg)    GENERAL:alert, no distress and comfortable SKIN: skin color, texture, turgor are normal, no rashes or significant lesions EYES: normal, Conjunctiva are pink and non-injected, sclera clear OROPHARYNX:no exudate, no erythema and lips, buccal mucosa, and tongue normal  NECK: supple, thyroid normal size, non-tender, without nodularity LYMPH:  no palpable lymphadenopathy in the cervical, axillary or inguinal LUNGS: clear to auscultation and percussion with normal breathing effort HEART: regular rate & rhythm and no murmurs and no lower extremity edema ABDOMEN:abdomen soft, non-tender and normal bowel sounds Musculoskeletal:no cyanosis of digits and no clubbing  NEURO: alert & oriented x 3 with fluent speech, no focal motor/sensory deficits  LABORATORY DATA:  I have  reviewed the data as listed    Component Value Date/Time   NA 139 01/18/2021 1010   NA 142 11/28/2016 0916   K 4.2 01/18/2021 1010   K 3.9 11/28/2016 0916   CL 104 01/18/2021 1010   CO2 25 01/18/2021 1010   CO2 28 11/28/2016 0916   GLUCOSE 100 (H) 01/18/2021 1010   GLUCOSE 78 11/28/2016 0916   BUN 16 01/18/2021 1010   BUN 16.8 11/28/2016 0916   CREATININE 0.79 01/18/2021 1010   CREATININE 0.8 11/28/2016 0916   CALCIUM 9.1 01/18/2021 1010   CALCIUM 10.5 (H) 11/28/2016 0916   PROT 6.7 01/18/2021 1010   PROT 7.3 11/28/2016 0916  ALBUMIN 3.4 (L) 01/18/2021 1010   ALBUMIN 4.3 11/28/2016 0916   AST 106 (H) 01/18/2021 1010   AST 19 11/28/2016 0916   ALT 28 01/18/2021 1010   ALT 19 11/28/2016 0916   ALKPHOS 299 (H) 01/18/2021 1010   ALKPHOS 50 11/28/2016 0916   BILITOT 0.5 01/18/2021 1010   BILITOT 0.48 11/28/2016 0916   GFRNONAA >60 01/18/2021 1010   GFRAA >60 08/14/2020 1305    No results found for: SPEP, UPEP  Lab Results  Component Value Date   WBC 5.3 01/18/2021   NEUTROABS 3.2 01/18/2021   HGB 11.5 (L) 01/18/2021   HCT 34.9 (L) 01/18/2021   MCV 90.2 01/18/2021   PLT 154 01/18/2021      Chemistry      Component Value Date/Time   NA 139 01/18/2021 1010   NA 142 11/28/2016 0916   K 4.2 01/18/2021 1010   K 3.9 11/28/2016 0916   CL 104 01/18/2021 1010   CO2 25 01/18/2021 1010   CO2 28 11/28/2016 0916   BUN 16 01/18/2021 1010   BUN 16.8 11/28/2016 0916   CREATININE 0.79 01/18/2021 1010   CREATININE 0.8 11/28/2016 0916      Component Value Date/Time   CALCIUM 9.1 01/18/2021 1010   CALCIUM 10.5 (H) 11/28/2016 0916   ALKPHOS 299 (H) 01/18/2021 1010   ALKPHOS 50 11/28/2016 0916   AST 106 (H) 01/18/2021 1010   AST 19 11/28/2016 0916   ALT 28 01/18/2021 1010   ALT 19 11/28/2016 0916   BILITOT 0.5 01/18/2021 1010   BILITOT 0.48 11/28/2016 0916

## 2021-01-26 ENCOUNTER — Encounter: Payer: Self-pay | Admitting: Hematology and Oncology

## 2021-01-26 DIAGNOSIS — K1379 Other lesions of oral mucosa: Secondary | ICD-10-CM | POA: Diagnosis not present

## 2021-01-26 DIAGNOSIS — C569 Malignant neoplasm of unspecified ovary: Secondary | ICD-10-CM | POA: Diagnosis not present

## 2021-01-28 ENCOUNTER — Encounter: Payer: Self-pay | Admitting: Hematology and Oncology

## 2021-02-01 IMAGING — CT CT ABD-PELV W/ CM
2 of 5 series · 16 of 46 positions shown, 18 images · IV contrast (OMNIPAQUE)
Comparison: 08/06/2019, 09/14/2018

CLINICAL DATA: Recurrent metastatic ovarian cancer

EXAM:
CT ABDOMEN AND PELVIS WITH CONTRAST
TECHNIQUE: Multidetector CT imaging of the abdomen and pelvis was performed
using the standard protocol following bolus administration of
intravenous contrast.
CONTRAST:  100mL OMNIPAQUE IOHEXOL 300 MG/ML SOLN, additional oral
enteric contrast

[Series 2: axial st · axial · 0.70mm/px · z∈[-420,-75]mm · 13 of 81 slices shown, 15 images]
[im 6/81  soft-tissue]
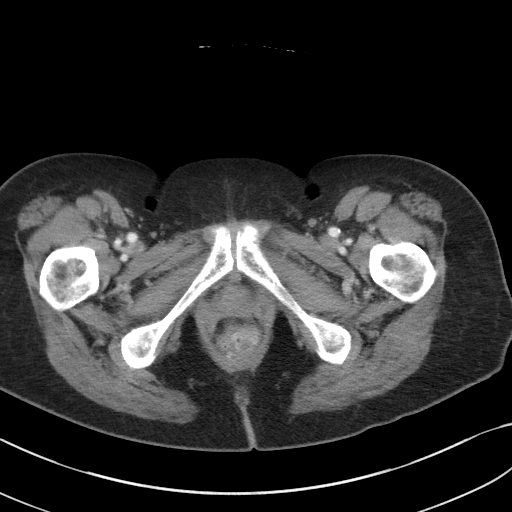
[im 6/81  bone]
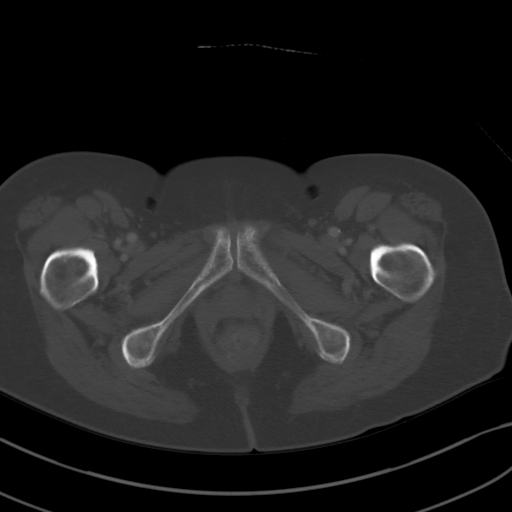
[im 11/81  soft-tissue]
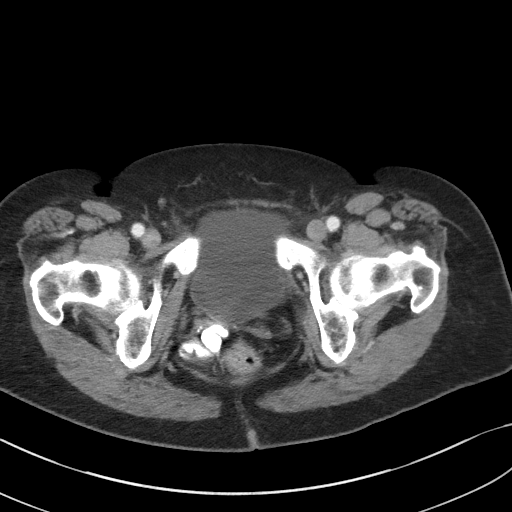
[im 17/81  soft-tissue]
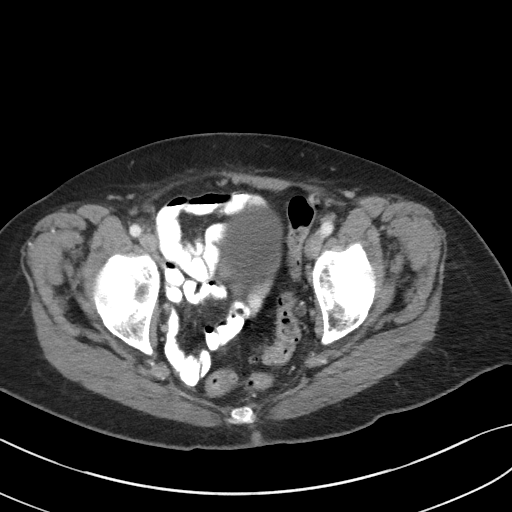
[im 22/81  soft-tissue]
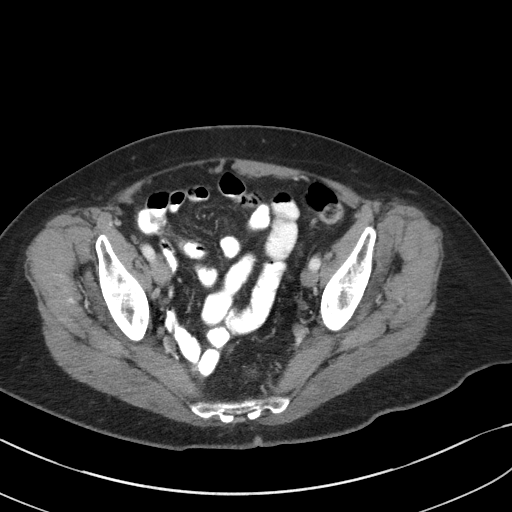
[im 27/81  soft-tissue]
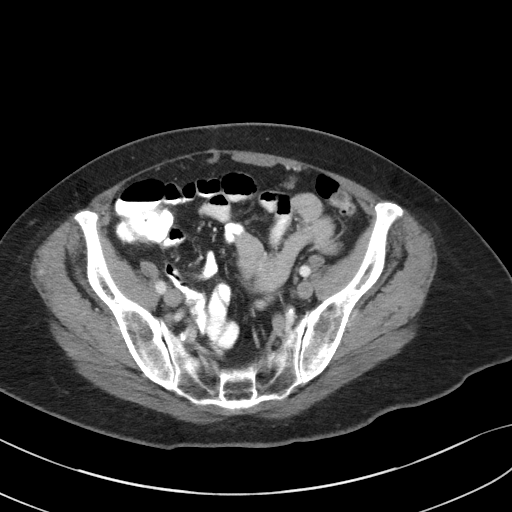
[im 33/81  soft-tissue]
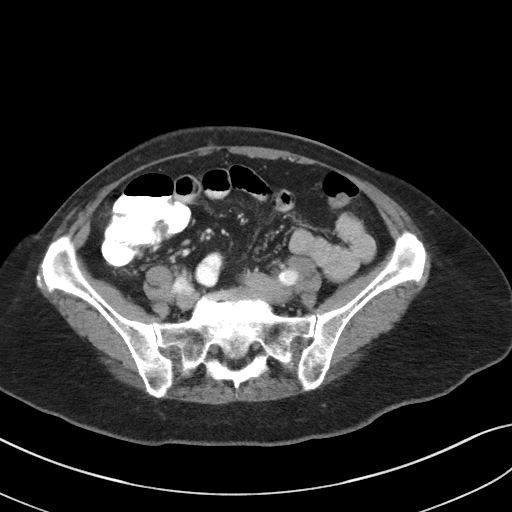
[im 43/81  soft-tissue]
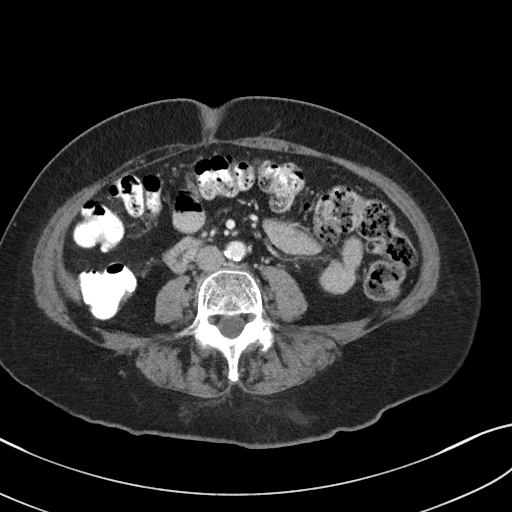
[im 49/81  soft-tissue]
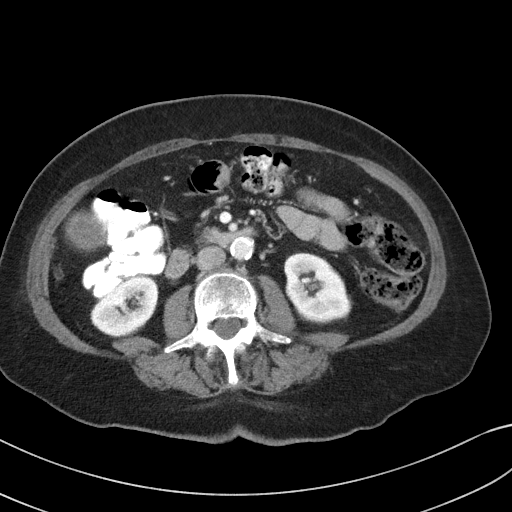
[im 54/81  soft-tissue]
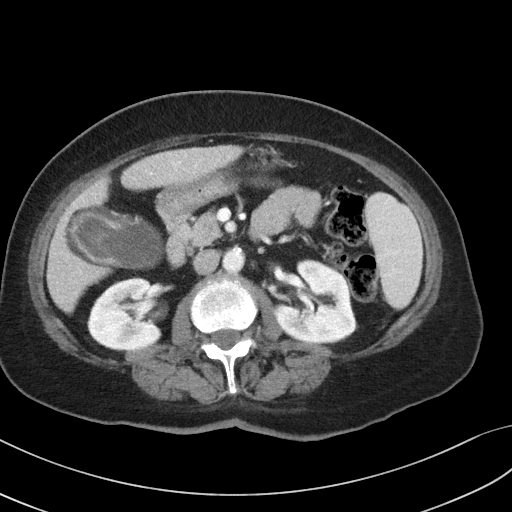
[im 54/81  bone]
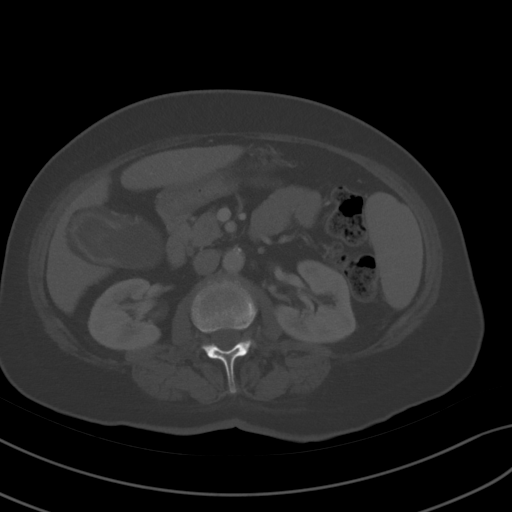
[im 59/81  soft-tissue]
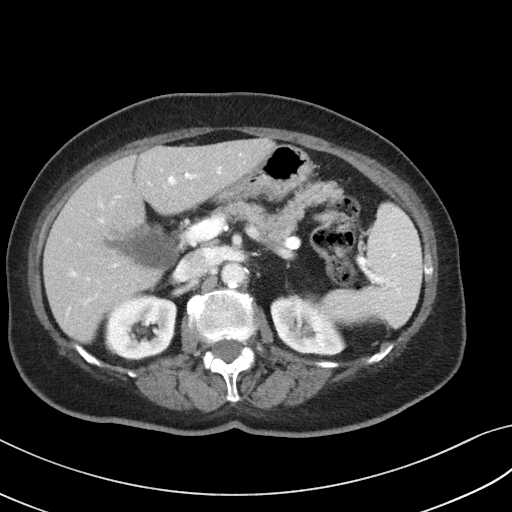
[im 65/81  soft-tissue]
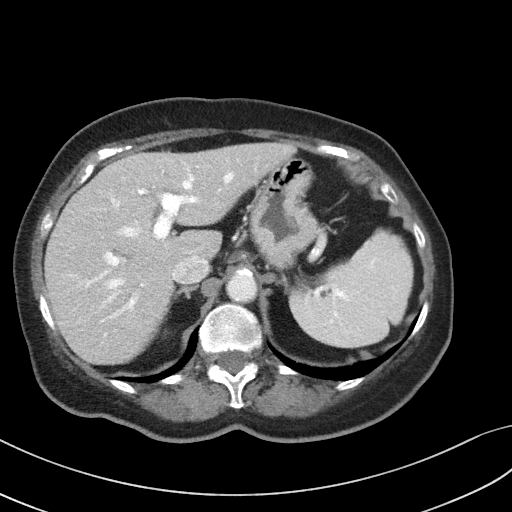
[im 70/81  soft-tissue]
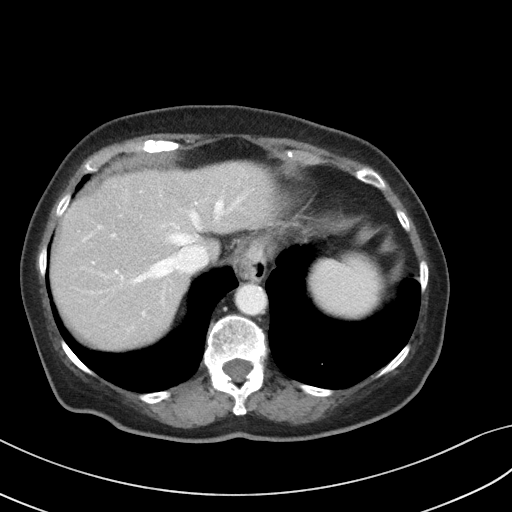
[im 75/81  soft-tissue]
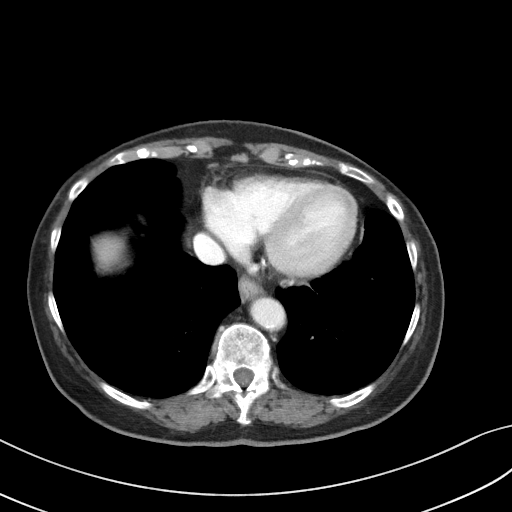

[Series 4: coronal st · coronal · 0.71mm/px · 3 of 85 slices shown]
[im 29/85  soft-tissue]
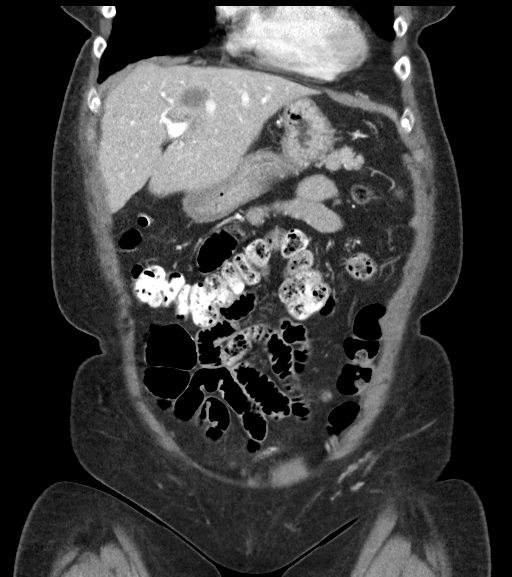
[im 38/85  soft-tissue]
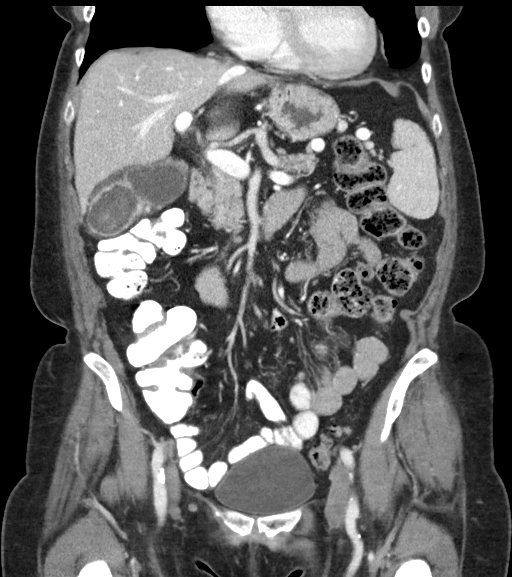
[im 47/85  soft-tissue]
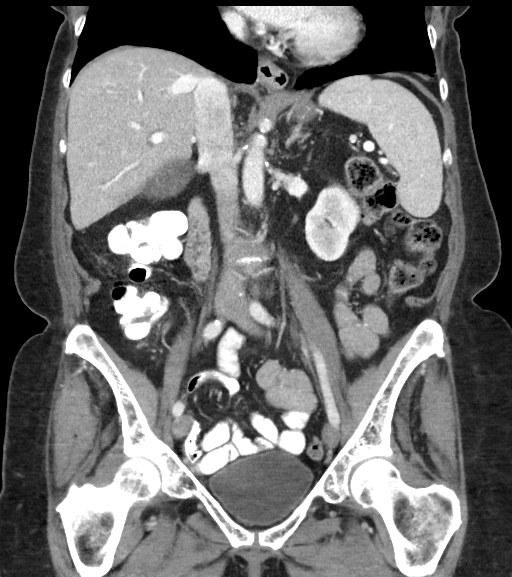

[16 of 46 positions shown; findings below may reference images not displayed]

FINDINGS: Lower chest: No acute abnormality.  Small hiatal hernia.

Hepatobiliary: Interval decrease in hypodense masses of the left
lobe and right lobe of the liver, mass in the left lobe measuring
1.7 x 1.6 cm, previously 2.5 x 2.5 cm (series 2, image 14) and in
the right lobe of the liver measuring 0.8 x 0.7 cm, previously 1.1 x
1.1 cm (series 2, image 22). Large rim calcified gallstones. No
biliary ductal dilatation.

Pancreas: Unremarkable. No pancreatic ductal dilatation or
surrounding inflammatory changes.

Spleen: Normal in size without significant abnormality.

Adrenals/Urinary Tract: Adrenal glands are unremarkable. Kidneys are
normal, without renal calculi, solid lesion, or hydronephrosis.
Bladder is unremarkable.

Stomach/Bowel: Stomach is within normal limits. Appendix appears
normal. No evidence of bowel wall thickening, distention, or
inflammatory changes. Sigmoid diverticulosis.

Vascular/Lymphatic: Aortic atherosclerosis. No enlarged abdominal or
pelvic lymph nodes.

Reproductive: Status post hysterectomy.

Other: No abdominal wall hernia or abnormality. Interval decrease in
size of an omental nodule, measuring 0.8 x 0.7 cm, previously 1.0 x
1.0 cm (series 2, image 55). No abdominopelvic ascites.

Musculoskeletal: No acute or significant osseous findings.
IMPRESSION: 1. Interval decrease in hypodense masses of the left lobe and right
lobe of the liver, mass in the left lobe measuring 1.7 x 1.6 cm,
previously 2.5 x 2.5 cm (series 2, image 14) and in the right lobe
of the liver measuring 0.8 x 0.7 cm, previously 1.1 x 1.1 cm (series
2, image 22).

2. Interval decrease in size of an omental nodule, measuring 0.8 x
0.7 cm, previously 1.0 x 1.0 cm (series 2, image 55).

3. Findings are consistent with improved metastatic disease. No new
evidence of metastatic disease in the abdomen or pelvis.

4.  Status post hysterectomy.

5.  Cholelithiasis.

6.  Aortic Atherosclerosis ([id number]).

## 2021-02-03 ENCOUNTER — Encounter: Payer: Self-pay | Admitting: Hematology and Oncology

## 2021-02-03 DIAGNOSIS — K1379 Other lesions of oral mucosa: Secondary | ICD-10-CM | POA: Diagnosis not present

## 2021-02-08 ENCOUNTER — Encounter: Payer: Self-pay | Admitting: Hematology and Oncology

## 2021-02-12 ENCOUNTER — Other Ambulatory Visit: Payer: Self-pay | Admitting: Hematology and Oncology

## 2021-03-05 ENCOUNTER — Other Ambulatory Visit: Payer: Self-pay

## 2021-03-05 ENCOUNTER — Telehealth: Payer: Self-pay

## 2021-03-05 ENCOUNTER — Encounter: Payer: Self-pay | Admitting: Hematology and Oncology

## 2021-03-05 DIAGNOSIS — C561 Malignant neoplasm of right ovary: Secondary | ICD-10-CM

## 2021-03-05 DIAGNOSIS — Z7189 Other specified counseling: Secondary | ICD-10-CM

## 2021-03-05 MED ORDER — PROCHLORPERAZINE MALEATE 10 MG PO TABS: 10.0000 mg | ORAL_TABLET | Freq: Four times a day (QID) | ORAL | 1 refills | Status: AC | PRN

## 2021-03-05 NOTE — Telephone Encounter (Signed)
Called and given below message. She verbralzied understanding. She said she does not have nausea. She has a lack of appetite. Sent Compazine Rx to pharmacy. She already has a Zofran Rx.

## 2021-03-05 NOTE — Telephone Encounter (Signed)
-----   Message from Heath Lark, MD sent at 03/05/2021 11:52 AM EDT ----- Tell her for nausea she can take compazine. She should also have zofran and if not, please e-scribe it I will address all her other questions when I see her

## 2021-03-08 ENCOUNTER — Encounter (HOSPITAL_COMMUNITY): Payer: Self-pay

## 2021-03-08 ENCOUNTER — Inpatient Hospital Stay: Payer: Medicare PPO | Attending: Hematology and Oncology

## 2021-03-08 ENCOUNTER — Inpatient Hospital Stay: Payer: Medicare PPO

## 2021-03-08 ENCOUNTER — Other Ambulatory Visit: Payer: Self-pay

## 2021-03-08 ENCOUNTER — Ambulatory Visit (HOSPITAL_COMMUNITY)
Admission: RE | Admit: 2021-03-08 | Discharge: 2021-03-08 | Disposition: A | Discharge status: Home / Self Care | Payer: Medicare PPO | Source: Ambulatory Visit | Attending: Hematology and Oncology | Admitting: Hematology and Oncology

## 2021-03-08 DIAGNOSIS — C787 Secondary malignant neoplasm of liver and intrahepatic bile duct: Secondary | ICD-10-CM | POA: Insufficient documentation

## 2021-03-08 DIAGNOSIS — K8020 Calculus of gallbladder without cholecystitis without obstruction: Secondary | ICD-10-CM | POA: Diagnosis not present

## 2021-03-08 DIAGNOSIS — I10 Essential (primary) hypertension: Secondary | ICD-10-CM | POA: Diagnosis not present

## 2021-03-08 DIAGNOSIS — C762 Malignant neoplasm of abdomen: Secondary | ICD-10-CM | POA: Insufficient documentation

## 2021-03-08 DIAGNOSIS — K7689 Other specified diseases of liver: Secondary | ICD-10-CM | POA: Diagnosis not present

## 2021-03-08 DIAGNOSIS — C7800 Secondary malignant neoplasm of unspecified lung: Secondary | ICD-10-CM | POA: Diagnosis not present

## 2021-03-08 DIAGNOSIS — R110 Nausea: Secondary | ICD-10-CM | POA: Insufficient documentation

## 2021-03-08 DIAGNOSIS — C569 Malignant neoplasm of unspecified ovary: Secondary | ICD-10-CM | POA: Diagnosis not present

## 2021-03-08 DIAGNOSIS — C561 Malignant neoplasm of right ovary: Secondary | ICD-10-CM | POA: Insufficient documentation

## 2021-03-08 DIAGNOSIS — Z7189 Other specified counseling: Secondary | ICD-10-CM

## 2021-03-08 LAB — CBC WITH DIFFERENTIAL (CANCER CENTER ONLY)
Abs Immature Granulocytes: 0.03 10*3/uL (ref 0.00–0.07)
Basophils Absolute: 0.1 10*3/uL (ref 0.0–0.1)
Basophils Relative: 1 %
Eosinophils Absolute: 0.1 10*3/uL (ref 0.0–0.5)
Eosinophils Relative: 2 %
HCT: 36 % (ref 36.0–46.0)
Hemoglobin: 11.6 g/dL — ABNORMAL LOW (ref 12.0–15.0)
Immature Granulocytes: 1 %
Lymphocytes Relative: 15 %
Lymphs Abs: 0.9 10*3/uL (ref 0.7–4.0)
MCH: 28.9 pg (ref 26.0–34.0)
MCHC: 32.2 g/dL (ref 30.0–36.0)
MCV: 89.8 fL (ref 80.0–100.0)
Monocytes Absolute: 0.9 10*3/uL (ref 0.1–1.0)
Monocytes Relative: 15 %
Neutro Abs: 4 10*3/uL (ref 1.7–7.7)
Neutrophils Relative %: 66 %
Platelet Count: 193 10*3/uL (ref 150–400)
RBC: 4.01 MIL/uL (ref 3.87–5.11)
RDW: 13.5 % (ref 11.5–15.5)
WBC Count: 5.9 10*3/uL (ref 4.0–10.5)
nRBC: 0 % (ref 0.0–0.2)

## 2021-03-08 LAB — CMP (CANCER CENTER ONLY)
ALT: 22 U/L (ref 0–44)
AST: 162 U/L — ABNORMAL HIGH (ref 15–41)
Albumin: 3.6 g/dL (ref 3.5–5.0)
Alkaline Phosphatase: 218 U/L — ABNORMAL HIGH (ref 38–126)
Anion gap: 13 (ref 5–15)
BUN: 19 mg/dL (ref 8–23)
CO2: 23 mmol/L (ref 22–32)
Calcium: 9.7 mg/dL (ref 8.9–10.3)
Chloride: 103 mmol/L (ref 98–111)
Creatinine: 0.83 mg/dL (ref 0.44–1.00)
GFR, Estimated: >60 mL/min (ref >60)
Glucose, Bld: 93 mg/dL (ref 70–99)
Potassium: 4.4 mmol/L (ref 3.5–5.1)
Sodium: 139 mmol/L (ref 135–145)
Total Bilirubin: 0.5 mg/dL (ref 0.3–1.2)
Total Protein: 7.2 g/dL (ref 6.5–8.1)

## 2021-03-08 MED ORDER — IOHEXOL 9 MG/ML PO SOLN
ORAL | Status: AC
  Filled 2021-03-08: qty 1000

## 2021-03-08 MED ORDER — HEPARIN SOD (PORK) LOCK FLUSH 100 UNIT/ML IV SOLN
500.0000 [IU] | Freq: Once | INTRAVENOUS | Status: AC
  Administered 2021-03-08: 500 [IU] via INTRAVENOUS

## 2021-03-08 MED ORDER — IOHEXOL 9 MG/ML PO SOLN
1000.0000 mL | ORAL | Status: AC
  Administered 2021-03-08: 1000 mL via ORAL

## 2021-03-08 MED ORDER — HEPARIN SOD (PORK) LOCK FLUSH 100 UNIT/ML IV SOLN
INTRAVENOUS | Status: AC
  Filled 2021-03-08: qty 5

## 2021-03-08 MED ORDER — IOHEXOL 300 MG/ML  SOLN
100.0000 mL | Freq: Once | INTRAMUSCULAR | Status: AC | PRN
  Administered 2021-03-08: 100 mL via INTRAVENOUS

## 2021-03-09 ENCOUNTER — Encounter: Payer: Self-pay | Admitting: Hematology and Oncology

## 2021-03-09 ENCOUNTER — Other Ambulatory Visit: Payer: Self-pay

## 2021-03-09 ENCOUNTER — Inpatient Hospital Stay: Payer: Medicare PPO | Admitting: Hematology and Oncology

## 2021-03-09 DIAGNOSIS — R110 Nausea: Secondary | ICD-10-CM | POA: Diagnosis not present

## 2021-03-09 DIAGNOSIS — C7801 Secondary malignant neoplasm of right lung: Secondary | ICD-10-CM | POA: Diagnosis not present

## 2021-03-09 DIAGNOSIS — C7802 Secondary malignant neoplasm of left lung: Secondary | ICD-10-CM | POA: Diagnosis not present

## 2021-03-09 DIAGNOSIS — Z7189 Other specified counseling: Secondary | ICD-10-CM | POA: Diagnosis not present

## 2021-03-09 DIAGNOSIS — C787 Secondary malignant neoplasm of liver and intrahepatic bile duct: Secondary | ICD-10-CM

## 2021-03-09 DIAGNOSIS — C762 Malignant neoplasm of abdomen: Secondary | ICD-10-CM | POA: Diagnosis not present

## 2021-03-09 DIAGNOSIS — C561 Malignant neoplasm of right ovary: Secondary | ICD-10-CM | POA: Diagnosis not present

## 2021-03-09 DIAGNOSIS — I10 Essential (primary) hypertension: Secondary | ICD-10-CM

## 2021-03-09 DIAGNOSIS — C7800 Secondary malignant neoplasm of unspecified lung: Secondary | ICD-10-CM | POA: Insufficient documentation

## 2021-03-09 NOTE — Assessment & Plan Note (Signed)
This is due to disease progression and is the cause of her nausea With significant disease burden, I anticipate she will have problems with nausea, constipation and pain We will provide medication for supportive care

## 2021-03-09 NOTE — Assessment & Plan Note (Signed)
With her recent weight loss, her blood pressure is now low I recommend discontinuation of amlodipine I also recommend taper of atenolol

## 2021-03-09 NOTE — Progress Notes (Signed)
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[facility]) Unfortunately, her blood work and imaging studies show evidence of disease progression with numerous new bilateral pulmonary nodules as well as progression of hepatic disease The disease burden is high I suspect her recent weight loss and poor appetite and nausea were all related to omental disease Overall, her prognosis is poor We discussed the risk, benefit, side effects of further palliative chemotherapy versus referral to palliative care with hospice At this point in time, she except poor prognosis and is in agreement to be referred back to palliative care with hospice She has contact with home-based palliative care from previous referral and she will try to make contact with the hospice nurse directly    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[facility]) She has symptoms with nausea Her liver enzymes are relatively preserved Recommend supportive care for now  Metastasis to lung [facility]) Her oxygen saturation on room air is adequate Anticipate she might need oxygen in the future  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 and is the cause of her nausea With significant disease burden, I anticipate she will have problems with nausea, constipation and pain We will provide medication for supportive care  Goals of care, counseling/discussion I reviewed the goals of care with the patient again Unfortunately, she has significant disease progression since her last CT imaging I estimated her prognosis is poor without chemotherapy, she would likely succumb to her disease in less than 6 months I recommend palliative care with hospice and she is in agreement  Essential hypertension With her recent weight loss, her blood pressure is now low I recommend discontinuation of amlodipine I also recommend taper of  atenolol  Nausea without vomiting Recommend her to continue antiemetics as needed   No orders of the defined types were placed in this encounter.   All questions were answered. The patient knows to call the clinic with any problems, questions or concerns. The total time spent in the appointment was 40 minutes encounter with patients including review of chart and various tests results, discussions about plan of care and coordination of care plan   Heath Lark, MD 03/09/2021 1:39 PM  INTERVAL HISTORY: Please see below for problem oriented charting. She returns with her son for further follow-up Since last time I saw her, she of nausea, lack of appetite and has lost some weight She denies cough, chest pain or shortness of breath The patient is aware that she is not doing well when she felt worse since last time I saw her  SUMMARY OF ONCOLOGIC HISTORY: Oncology History Overview Note  Neg genetics. Additional tests revealed ER: 80%, PR 3% MSI stable Progressed on Avastin, Doxil, Taxol, Gemzar and Femara Allergic to carboplatin   Right ovarian epithelial cancer ([place])  07/01/2015 Initial Diagnosis   She presented with postmenopausal bleeding   07/02/2015 Imaging   US pelvis showed bilateral ovarian cyst   07/13/2015 Imaging   5.2 cm left adnexal cystic lesion, with indeterminate but probably benign characteristics. In a postmenopausal female, consider continued annual imaging followup with CT or MRI versus surgical evaluation.  Colonic diverticulosis. No radiographic evidence of diverticulitis.  Cholelithiasis.  No radiographic evidence of cholecystitis.  Small hiatal hernia.   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  The tumor involves serosal surface  of the right ovary and adjacent right fallopian tube.  Cervix and endometrium were within normal limits. ER positive Peritoneal washing was  positive.   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   08/13/2015 Tumor Marker   Patient's tumor was tested for the following markers: CA-125 Results of the tumor marker test revealed 96   08/25/2015 - 12/08/2015 Chemotherapy   The patient had 6 cycles of carboplatin and taxol   09/07/2015 Tumor Marker   Patient's tumor was tested for the following markers: CA-125 Results of the tumor marker test revealed 51   10/05/2015 Tumor Marker   Patient's tumor was tested for the following markers: CA-125 Results of the tumor marker test revealed 29   11/09/2015 Tumor Marker   Patient's tumor was tested for the following markers: CA-125 Results of the tumor marker test revealed 44   12/08/2015 Tumor Marker   Patient's tumor was tested for the following markers: CA-125 Results of the tumor marker test revealed 30   12/15/2015 Genetic Testing   Genetics testing normal by [employer] Breast Ovarian panel 12-15-15   01/11/2016 Imaging   1. The only finding of note are several adjacent peripheral abnormal hypodensities along the anterior superior spleen margin, differential diagnostic considerations including small peripheral interval splenic infarcts, splenic injury with subcapsular a fluid collections, or less likely metastatic disease to the splenic margin. This likely merits observation. 2. Other imaging findings of potential clinical significance: Small type 1 hiatal hernia. Aortoiliac atherosclerotic vascular disease. Lower lumbar spondylosis and degenerative disc disease. Gallstones with potential mild gallbladder wall thickening.   03/07/2016 Tumor Marker   Patient's tumor was tested for the following markers: CA-125 Results of the tumor marker test revealed 24.2    04/06/2016 Imaging   1. No evidence of a ovarian cancer metastasis. 2. No ascites. 3. Post hysterectomy and oophorectomy. 4. Cholelithiasis with several large gallstones   04/06/2016 Tumor Marker   Patient's tumor was tested for the following markers: CA-125 Results of the tumor marker test revealed 22.8   05/30/2016 Tumor Marker   Patient's tumor was tested for the following markers: CA-125 Results of the tumor marker test revealed 21   09/07/2016 Tumor Marker   Patient's tumor was tested for the following markers: CA-125 Results of the tumor marker test revealed 22.4   11/28/2016 Tumor Marker   Patient's tumor was tested for the following markers: CA-125 Results of the tumor marker test revealed 18.8   02/23/2017 Tumor Marker   Patient's tumor was tested for the following markers: CA-125 Results of the tumor marker test revealed 19.1   06/02/2017 Tumor Marker   Patient's tumor was tested for the following markers: CA-125 Results of the tumor marker test revealed 18.3   08/24/2017 Mammogram   Pt reports mammogram complete   08/28/2017 Tumor Marker   Patient's tumor was tested for the following markers: CA-125 Results of the tumor marker test revealed 21.1   12/01/2017 Tumor Marker   Patient's tumor was tested for the following markers: CA-125 Results of the tumor marker test revealed 31.2   12/13/2017 Imaging   New 2.7 cm peritoneal soft tissue mass in anterior left lower quadrant, consistent with metastatic disease.  No other sites of metastatic disease identified.  Incidental findings including: Cholelithiasis. Colonic diverticulosis. Tiny hiatal hernia. Aortic atherosclerosis.   01/09/2018 -  05/07/2018 Chemotherapy   The patient had carboplatin and taxol; After 2nd cycle, she developed carboplatin allergy. She received carboplatin desensitization protocol at [facility] for final 4 cycles, completed by 6/17. Avastin was added from 04/16/18 onwards   01/30/2018 Adverse Reaction    Potential carboplatin allergy is suspected. She received half the dose of prescribed carboplatin on cycle 2   05/31/2018 Tumor Marker   Patient's tumor was tested for the following markers: CA-125 Results of the tumor marker test revealed 26   05/31/2018 Imaging   1. Peritoneal soft tissue nodule identified on the previous study has decreased in the interval the. No progressive findings in the abdomen or pelvis on today's study to suggest disease progression. 2. Tiny pulmonary nodules, likely benign. Attention on follow-up recommended. 3. Cholelithiasis. 4.  Aortic Atherosclerois (ICD10-170.0) 5. Tiny hiatal hernia.     06/04/2018 - 07/30/2019 Chemotherapy   The patient is placed on Avastin for maintenance   06/25/2018 Tumor Marker   Patient's tumor was tested for the following markers: CA-125 Results of the tumor marker test revealed 22.5   08/06/2018 Tumor Marker   Patient's tumor was tested for the following markers: CA-125 Results of the tumor marker test revealed 20.7   09/14/2018 Imaging   Status post hysterectomy and bilateral salpingo-oophorectomy.  Stable soft tissue nodule beneath the left lower anterior abdominal wall, likely reflecting stable peritoneal disease.  Scattered small subpleural nodules in the lungs bilaterally, measuring up to 3 mm, technically indeterminate although likely benign. Please note that Fleischner Society guidelines do not apply. Attention on follow-up is suggested.  No evidence of new/progressive metastatic disease.   09/14/2018 Tumor Marker   Patient's tumor was tested for the following markers: CA-125 Results of the tumor marker test revealed 21.3   10/30/2018 Tumor Marker   Patient's tumor was tested for the following markers: CA-125 Results of the tumor marker test revealed 20.8   12/12/2018 Tumor Marker   Patient's tumor was tested for the following markers: CA-125 Results of the tumor marker test revealed 19.7   01/01/2019 Tumor Marker    Patient's tumor was tested for the following markers: CA-125 Results of the tumor marker test revealed 21.6   01/22/2019 Tumor Marker   Patient's tumor was tested for the following markers: CA-125 Results of the tumor marker test revealed 21.3   02/12/2019 Tumor Marker   Patient's tumor was tested for the following markers: CA-125 Results of the tumor marker test revealed 21.6   04/16/2019 Tumor Marker   Patient's tumor was tested for the following markers: CA-125 Results of the tumor marker test revealed 21.7   05/07/2019 Tumor Marker   Patient's tumor was tested for the following markers: CA-125 Results of the tumor marker test revealed 27.3   05/28/2019 Tumor Marker   Patient's tumor was tested for the following markers: CA-125 Results of the tumor marker test revealed 24.5   07/09/2019 - 08/19/2019 Chemotherapy   The patient had bevacizumab for chemotherapy treatment.     07/09/2019 Tumor Marker   Patient's tumor was tested for the following markers: CA-125 Results of the tumor marker test revealed 23.3.   08/06/2019 Imaging   Ct abdomen and pelvis 1. New hypodense lesions of the left lobe of the liver measuring 2.5 x 2.5 cm (series 2, image 16) and right lobe of the liver measuring 1.1 x 1.1 cm (series 2, image 23), concerning for metastatic disease.   2. Interval enlargement of an irregular nodule of the omentum measuring 1.0 x  1.0 cm, previously 1.0 x 0.5 cm (series 2, image 51), concerning for peritoneal disease.   3.  Status post hysterectomy.   4. Other chronic and incidental findings as detailed above. Aortic Atherosclerosis (ICD10-I70.0).   08/16/2019 - 01/17/2020 Chemotherapy   The patient had weekly Taxol for chemotherapy treatment.     08/23/2019 Tumor Marker   Patient's tumor was tested for the following markers: CA-125 Results of the tumor marker test revealed 33.7.   09/06/2019 Tumor Marker   Patient's tumor was tested for the following markers:  CA-125 Results of the tumor marker test revealed 32.2   10/14/2019 Imaging   1. Interval decrease in hypodense masses of the left lobe and right lobe of the liver, mass in the left lobe measuring 1.7 x 1.6 cm, previously 2.5 x 2.5 cm (series 2, image 14) and in the right lobe of the liver measuring 0.8 x 0.7 cm, previously 1.1 x 1.1 cm (series 2, image 22).   2. Interval decrease in size of an omental nodule, measuring 0.8 x 0.7 cm, previously 1.0 x 1.0 cm (series 2, image 55).   3. Findings are consistent with improved metastatic disease. No new evidence of metastatic disease in the abdomen or pelvis.   4.  Status post hysterectomy.   5.  Cholelithiasis.   6.  Aortic Atherosclerosis (ICD10-I70.0).   01/23/2020 Imaging   1. 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 2. Slight increase in size of left lower quadrant omental soft tissue nodule, consistent with metastatic disease. 3. No new sites of metastatic disease identified. 4. Stable cholelithiasis and gallbladder wall thickening. No evidence of acute cholecystitis. 5. Colonic diverticulosis. No radiographic evidence of diverticulitis. 6. Tiny hiatal hernia.   01/28/2020 Echocardiogram    1. Left ventricular ejection fraction, by estimation, is 60 to 65%. The left ventricle has normal function. The left ventricle has no regional wall motion abnormalities. Left ventricular diastolic parameters are consistent with Grade I diastolic dysfunction (impaired relaxation). The average left ventricular global longitudinal strain is -19.7 % (normal), however this may be underestimated given suboptimal tracking.  2. Right ventricular systolic function is normal. The right ventricular size is normal.  3. The mitral valve is normal in structure. Mild mitral valve regurgitation. No evidence of mitral stenosis.  4. The aortic valve is normal in structure. Aortic valve regurgitation is not visualized. No aortic stenosis is present.  5. The  inferior vena cava is normal in size with greater than 50% respiratory variability, suggesting right atrial pressure of 3 mmHg.   Conclusion(s)/Recommendation(s): Normal biventricular function without evidence of hemodynamically significant valvular heart disease   01/31/2020 - 04/23/2020 Chemotherapy   The patient had DOXOrubicin for chemotherapy treatment.     04/16/2020 Imaging   1. Mild increase in size of metastasis involving the left hepatic lobe. 2. Stable small left lower quadrant omental soft tissue nodule, suspicious for peritoneal metastasis. 3. No new sites of metastatic disease identified. 4. Cholelithiasis. No radiographic evidence of cholecystitis. 5. Colonic diverticulosis. No radiographic evidence of diverticulitis. 6. Small hiatal hernia.   Aortic Atherosclerosis (ICD10-I70.0).   05/01/2020 -  Chemotherapy   The patient had gemcitabine for chemotherapy treatment.     07/16/2020 Imaging   1. 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 Slight enlargement of the larger metastatic lesion in the LEFT hepatic lobe. 2. Mild increase in volume of small LEFT lower quadrant peritoneal nodule. 3. No ascites. 4. Chronic enhancement of the gallbladder fundus.  Cholelithiasis.  12/16/2020 Imaging   1. Increased burden of hepatic metastases and peritoneal implants. No abdominopelvic ascites. 2. No evidence of metastatic disease within the chest. 3. 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 4. Aortic atherosclerosis.   03/08/2021 Imaging   1. Unfortunately, fairly significant progression of disease is demonstrated. There are numerous new metastatic pulmonary nodules, progressive hepatic metastatic disease and progressive omental caking as detailed above. 2. Stable cholelithiasis. 3. Small amount of free pelvic fluid, new since the prior study.   Aortic Atherosclerosis (ICD10-I70.0).       REVIEW  OF SYSTEMS:   Constitutional: Denies fevers, chills or abnormal weight loss Eyes: Denies blurriness of vision Ears, nose, mouth, throat, and face: Denies mucositis or sore throat Respiratory: Denies cough, dyspnea or wheezes Cardiovascular: Denies palpitation, chest discomfort or lower extremity swelling Gastrointestinal:  Denies nausea, heartburn or change in bowel habits Skin: Denies abnormal skin rashes Lymphatics: Denies new lymphadenopathy or easy bruising Neurological:Denies numbness, tingling or new weaknesses Behavioral/Psych: Mood is stable, no new changes  All other systems were reviewed with the patient and are negative.  I have reviewed the past medical history, past surgical history, social history and family history with the patient and they are unchanged from previous note.  ALLERGIES:  is allergic to carboplatin.  MEDICATIONS:  Current Outpatient Medications  Medication Sig Dispense Refill  . Coenzyme Q10 (CO Q 10) 100 MG CAPS Take 1 capsule by mouth daily.     . Glucosamine-Chondroit-Vit C-Mn (GLUCOSAMINE 1500 COMPLEX PO) Take 1 capsule by mouth 2 (two) times daily.    [name] lidocaine-prilocaine (EMLA) cream Apply to affected area once 30 g 3  . loratadine (CLARITIN) 10 MG tablet Take 10 mg by mouth daily as needed for allergies (hives).    . Multiple Vitamin (MULTIVITAMIN) capsule Take 1 capsule by mouth daily.     . Omega-3 Fatty Acids (OMEGA-3 FISH OIL) 300 MG CAPS Take 1 capsule by mouth daily.     . ondansetron (ZOFRAN) 8 MG tablet Take 1 tablet (8 mg total) by mouth every 8 (eight) hours as needed for refractory nausea / vomiting. 90 tablet 1  . Polyethyl Glycol-Propyl Glycol (SYSTANE ULTRA OP) Apply 1 drop to eye 2 (two) times daily at 10 AM and 5 PM.    . Probiotic Product (PROBIOTIC ADVANCED PO) Take 1 capsule by mouth daily.    . prochlorperazine (COMPAZINE) 10 MG tablet Take 1 tablet (10 mg total) by mouth every 6 (six) hours as needed (Nausea or vomiting). 60  tablet 1  . vitamin E 400 UNIT capsule Take 400 Units by mouth every evening.      No current facility-administered medications for this visit.    PHYSICAL EXAMINATION: ECOG PERFORMANCE STATUS: 1 - Symptomatic but completely ambulatory  Vitals:   03/09/21 1256  BP: (!) 128/59  Pulse: 76  Resp: 18  Temp: (!) 97.4 F (36.3 C)  SpO2: 100%   Filed Weights   03/09/21 1256  Weight: 132 lb 6.4 oz (60.1 kg)    GENERAL:alert, no distress and comfortable.  She has lost some weight since last time I saw her NEURO: alert & oriented x 3 with fluent speech, no focal motor/sensory deficits  LABORATORY DATA:  I have reviewed the data as listed    Component Value Date/Time   NA 139 03/08/2021 0905   NA 142 11/28/2016 0916   K 4.4 03/08/2021 0905   K 3.9 11/28/2016 0916   CL 103  03/08/2021 0905   CO2 23 03/08/2021 0905   CO2 28 11/28/2016 0916   GLUCOSE 93 03/08/2021 0905   GLUCOSE 78 11/28/2016 0916   BUN 19 03/08/2021 0905   BUN 16.8 11/28/2016 0916   CREATININE 0.83 03/08/2021 0905   CREATININE 0.8 11/28/2016 0916   CALCIUM 9.7 03/08/2021 0905   CALCIUM 10.5 (H) 11/28/2016 0916   PROT 7.2 03/08/2021 0905   PROT 7.3 11/28/2016 0916   ALBUMIN 3.6 03/08/2021 0905   ALBUMIN 4.3 11/28/2016 0916   AST 162 (H) 03/08/2021 0905   AST 19 11/28/2016 0916   ALT 22 03/08/2021 0905   ALT 19 11/28/2016 0916   ALKPHOS 218 (H) 03/08/2021 0905   ALKPHOS 50 11/28/2016 0916   BILITOT 0.5 03/08/2021 0905   BILITOT 0.48 11/28/2016 0916   GFRNONAA >60 03/08/2021 0905   GFRAA >60 08/14/2020 1305    No results found for: SPEP, UPEP  Lab Results  Component Value Date   WBC 5.9 03/08/2021   NEUTROABS 4.0 03/08/2021   HGB 11.6 (L) 03/08/2021   HCT 36.0 03/08/2021   MCV 89.8 03/08/2021   PLT 193 03/08/2021      Chemistry      Component Value Date/Time   NA 139 03/08/2021 0905   NA 142 11/28/2016 0916   K 4.4 03/08/2021 0905   K 3.9 11/28/2016 0916   CL 103 03/08/2021 0905   CO2  23 03/08/2021 0905   CO2 28 11/28/2016 0916   BUN 19 03/08/2021 0905   BUN 16.8 11/28/2016 0916   CREATININE 0.83 03/08/2021 0905   CREATININE 0.8 11/28/2016 0916      Component Value Date/Time   CALCIUM 9.7 03/08/2021 0905   CALCIUM 10.5 (H) 11/28/2016 0916   ALKPHOS 218 (H) 03/08/2021 0905   ALKPHOS 50 11/28/2016 0916   AST 162 (H) 03/08/2021 0905   AST 19 11/28/2016 0916   ALT 22 03/08/2021 0905   ALT 19 11/28/2016 0916   BILITOT 0.5 03/08/2021 0905   BILITOT 0.48 11/28/2016 0916       RADIOGRAPHIC STUDIES: I reviewed multiple imaging studies with the patient and her son I have personally reviewed the radiological images as listed and agreed with the findings in the report. CT ABDOMEN PELVIS W CONTRAST  Result Date: 03/08/2021 CLINICAL DATA:  Restaging ovarian cancer.  Ongoing chemotherapy. EXAM: CT ABDOMEN AND PELVIS WITH CONTRAST TECHNIQUE: Multidetector CT imaging of the abdomen and pelvis was performed using the standard protocol following bolus administration of intravenous contrast. CONTRAST:  134m OMNIPAQUE IOHEXOL 300 MG/ML  SOLN COMPARISON:  12/16/2020 FINDINGS: Lower chest: No acute pulmonary findings. Numerous new bilateral pulmonary nodules consistent with pulmonary metastatic disease. Index nodule in the left lower lobe on image number 20/7 measures 8.5 mm. Index nodule in the right lower lobe on image number 10/7 measures 7.5 mm. Enlarging epicardial node or implant on the right side on image number 5/2. Short axis diameter is 11 mm and was previously 8 mm. Hepatobiliary: Progressive hepatic metastatic disease. There is a large conglomerate/coalescent mass in the left hepatic lobe measuring a maximum of 9.5 x 6.0 cm on image number 18/2. This previously measured 6.4 x 5.3 cm. Segment 6 lesion measures 3.1 x 2.7 cm on image number 26/2 and previously measured 2.5 x 1.6 cm. New 12.5 mm lesion in the far lateral aspect of segment 3 on image 26/2. 18 x 17 mm lesion in the  inferior aspect of segment 3 on image 33/2 previously measured 12 x  11 mm. Gallstones are noted the gallbladder but no findings for acute cholecystitis. Small implant along the gallbladder surface are suspected. No common bile duct dilatation. Pancreas: No mass, inflammation or ductal dilatation. Spleen: Normal size.  No focal lesions. Adrenals/Urinary Tract: Adrenal glands and kidneys are unremarkable the. The bladder is unremarkable. Stomach/Bowel: The stomach, duodenum, small bowel and colon are grossly normal. No acute inflammatory process or obstructive findings. Vascular/Lymphatic: Stable advanced atherosclerotic calcifications involving the abdominal aorta, iliac arteries and branch vessels but no aneurysm or dissection. The major venous structures are patent. Significant progression of omental disease since the prior study. Large mass of omental caking in the left abdomen just below the iliac crest level measures 5.7 x 3.0 cm and previously measured 3.5 x 2.3 cm. Large conglomerate omental mass on image 60/2 in the midline measures 7.9 x 3.2 cm and previously measured 3.6 x 1.7 cm. Right pelvic sidewall lymph node versus is soft tissue implant on image 59/2 measures 12 mm and previously measured 3.5 mm. Reproductive: Surgically absent. Other: Small amount of free pelvic fluid, new since the prior study. Musculoskeletal: No significant bony findings. IMPRESSION: 1. Unfortunately, fairly significant progression of disease is demonstrated. There are numerous new metastatic pulmonary nodules, progressive hepatic metastatic disease and progressive omental caking as detailed above. 2. Stable cholelithiasis. 3. Small amount of free pelvic fluid, new since the prior study. Aortic Atherosclerosis (ICD10-I70.0). Electronically Signed   By: Marijo Sanes M.D.   On: 03/08/2021 13:40

## 2021-03-09 NOTE — Assessment & Plan Note (Signed)
Her oxygen saturation on room air is adequate Anticipate she might need oxygen in the future

## 2021-03-09 NOTE — Assessment & Plan Note (Addendum)
I reviewed the goals of care with the patient again Unfortunately, she has significant disease progression since her last CT imaging I estimated her prognosis is poor without chemotherapy, she would likely succumb to her disease in less than 6 months I recommend palliative care with hospice and she is in agreement

## 2021-03-09 NOTE — Assessment & Plan Note (Addendum)
Unfortunately, her blood work and imaging studies show evidence of disease progression with numerous new bilateral pulmonary nodules as well as progression of hepatic disease The disease burden is high I suspect her recent weight loss and poor appetite and nausea were all related to omental disease Overall, her prognosis is poor We discussed the risk, benefit, side effects of further palliative chemotherapy versus referral to palliative care with hospice At this point in time, she except poor prognosis and is in agreement to be referred back to palliative care with hospice She has contact with home-based palliative care from previous referral and she will try to make contact with the hospice nurse directly

## 2021-03-09 NOTE — Assessment & Plan Note (Addendum)
She has symptoms with nausea Her liver enzymes are relatively preserved Recommend supportive care for now

## 2021-03-09 NOTE — Assessment & Plan Note (Signed)
Recommend her to continue antiemetics as needed

## 2021-05-21 ENCOUNTER — Encounter: Payer: Self-pay | Admitting: Hematology and Oncology

## 2021-06-21 DEATH — deceased
